# Patient Record
Sex: Female | Born: 1935 | Race: Black or African American | Hispanic: No | Smoking: Former smoker
Health system: Southern US, Community
[De-identification: ages and names within clinical notes are randomized; demographics above are authoritative.]

## PROBLEM LIST (undated history)

## (undated) DIAGNOSIS — I714 Abdominal aortic aneurysm, without rupture, unspecified: Secondary | ICD-10-CM

## (undated) DIAGNOSIS — L270 Generalized skin eruption due to drugs and medicaments taken internally: Secondary | ICD-10-CM

## (undated) DIAGNOSIS — E785 Hyperlipidemia, unspecified: Secondary | ICD-10-CM

## (undated) DIAGNOSIS — I639 Cerebral infarction, unspecified: Secondary | ICD-10-CM

## (undated) DIAGNOSIS — Z923 Personal history of irradiation: Secondary | ICD-10-CM

## (undated) DIAGNOSIS — K219 Gastro-esophageal reflux disease without esophagitis: Secondary | ICD-10-CM

## (undated) DIAGNOSIS — R000 Tachycardia, unspecified: Secondary | ICD-10-CM

## (undated) DIAGNOSIS — R042 Hemoptysis: Secondary | ICD-10-CM

## (undated) DIAGNOSIS — R5382 Chronic fatigue, unspecified: Secondary | ICD-10-CM

## (undated) DIAGNOSIS — J189 Pneumonia, unspecified organism: Secondary | ICD-10-CM

## (undated) DIAGNOSIS — C3490 Malignant neoplasm of unspecified part of unspecified bronchus or lung: Secondary | ICD-10-CM

## (undated) DIAGNOSIS — M1990 Unspecified osteoarthritis, unspecified site: Secondary | ICD-10-CM

## (undated) DIAGNOSIS — I739 Peripheral vascular disease, unspecified: Secondary | ICD-10-CM

## (undated) DIAGNOSIS — R918 Other nonspecific abnormal finding of lung field: Secondary | ICD-10-CM

## (undated) DIAGNOSIS — I10 Essential (primary) hypertension: Secondary | ICD-10-CM

## (undated) DIAGNOSIS — M79672 Pain in left foot: Secondary | ICD-10-CM

## (undated) DIAGNOSIS — R0602 Shortness of breath: Secondary | ICD-10-CM

## (undated) DIAGNOSIS — J95811 Postprocedural pneumothorax: Secondary | ICD-10-CM

## (undated) HISTORY — PX: LUMBAR DISC SURGERY: SHX700

## (undated) HISTORY — DX: Pain in left foot: M79.672

## (undated) HISTORY — DX: Abdominal aortic aneurysm, without rupture, unspecified: I71.40

## (undated) HISTORY — PX: EXPLORATORY LAPAROTOMY: SUR591

## (undated) HISTORY — DX: Chronic fatigue, unspecified: R53.82

## (undated) HISTORY — DX: Morbid (severe) obesity due to excess calories: E66.01

## (undated) HISTORY — DX: Essential (primary) hypertension: I10

## (undated) HISTORY — DX: Hyperlipidemia, unspecified: E78.5

## (undated) HISTORY — DX: Cerebral infarction, unspecified: I63.9

## (undated) HISTORY — DX: Postprocedural pneumothorax: J95.811

## (undated) HISTORY — DX: Generalized skin eruption due to drugs and medicaments taken internally: L27.0

## (undated) HISTORY — DX: Hemoptysis: R04.2

## (undated) HISTORY — DX: Tachycardia, unspecified: R00.0

## (undated) HISTORY — PX: APPENDECTOMY: SHX54

## (undated) HISTORY — PX: OTHER SURGICAL HISTORY: SHX169

## (undated) HISTORY — DX: Abdominal aortic aneurysm, without rupture: I71.4

---

## 1997-12-31 ENCOUNTER — Other Ambulatory Visit: Admission: RE | Admit: 1997-12-31 | Discharge: 1997-12-31 | Payer: Self-pay | Admitting: Family Medicine

## 1998-08-16 ENCOUNTER — Encounter: Payer: Self-pay | Admitting: Family Medicine

## 1998-08-16 ENCOUNTER — Ambulatory Visit (HOSPITAL_COMMUNITY): Admission: RE | Admit: 1998-08-16 | Discharge: 1998-08-16 | Payer: Self-pay | Admitting: Family Medicine

## 1999-04-11 ENCOUNTER — Other Ambulatory Visit: Admission: RE | Admit: 1999-04-11 | Discharge: 1999-04-11 | Payer: Self-pay | Admitting: Family Medicine

## 1999-08-27 ENCOUNTER — Encounter: Admission: RE | Admit: 1999-08-27 | Discharge: 1999-10-01 | Payer: Self-pay | Admitting: Internal Medicine

## 2000-03-16 ENCOUNTER — Emergency Department (HOSPITAL_COMMUNITY): Admission: EM | Admit: 2000-03-16 | Discharge: 2000-03-16 | Payer: Self-pay | Admitting: Emergency Medicine

## 2000-03-16 ENCOUNTER — Encounter: Payer: Self-pay | Admitting: Emergency Medicine

## 2000-03-25 ENCOUNTER — Emergency Department (HOSPITAL_COMMUNITY): Admission: EM | Admit: 2000-03-25 | Discharge: 2000-03-25 | Payer: Self-pay | Admitting: Emergency Medicine

## 2000-03-30 ENCOUNTER — Emergency Department (HOSPITAL_COMMUNITY): Admission: EM | Admit: 2000-03-30 | Discharge: 2000-03-30 | Payer: Self-pay | Admitting: Emergency Medicine

## 2000-04-19 ENCOUNTER — Inpatient Hospital Stay (HOSPITAL_COMMUNITY): Admission: RE | Admit: 2000-04-19 | Discharge: 2000-04-20 | Payer: Self-pay | Admitting: Orthopaedic Surgery

## 2001-04-15 ENCOUNTER — Encounter: Payer: Self-pay | Admitting: Gastroenterology

## 2001-04-15 ENCOUNTER — Encounter: Admission: RE | Admit: 2001-04-15 | Discharge: 2001-04-15 | Payer: Self-pay | Admitting: Gastroenterology

## 2001-05-25 ENCOUNTER — Encounter (INDEPENDENT_AMBULATORY_CARE_PROVIDER_SITE_OTHER): Payer: Self-pay | Admitting: *Deleted

## 2001-05-25 ENCOUNTER — Ambulatory Visit (HOSPITAL_COMMUNITY): Admission: RE | Admit: 2001-05-25 | Discharge: 2001-05-25 | Payer: Self-pay | Admitting: Gastroenterology

## 2001-12-15 ENCOUNTER — Ambulatory Visit (HOSPITAL_COMMUNITY): Admission: RE | Admit: 2001-12-15 | Discharge: 2001-12-15 | Payer: Self-pay | Admitting: Gastroenterology

## 2002-02-22 ENCOUNTER — Ambulatory Visit (HOSPITAL_COMMUNITY): Admission: RE | Admit: 2002-02-22 | Discharge: 2002-02-22 | Payer: Self-pay | Admitting: Gastroenterology

## 2004-11-28 ENCOUNTER — Encounter: Admission: RE | Admit: 2004-11-28 | Discharge: 2004-11-28 | Payer: Self-pay | Admitting: Gastroenterology

## 2004-12-05 ENCOUNTER — Ambulatory Visit (HOSPITAL_COMMUNITY): Admission: RE | Admit: 2004-12-05 | Discharge: 2004-12-05 | Payer: Self-pay | Admitting: Gastroenterology

## 2004-12-05 ENCOUNTER — Encounter (INDEPENDENT_AMBULATORY_CARE_PROVIDER_SITE_OTHER): Payer: Self-pay | Admitting: Specialist

## 2005-10-29 ENCOUNTER — Emergency Department (HOSPITAL_COMMUNITY): Admission: EM | Admit: 2005-10-29 | Discharge: 2005-10-29 | Payer: Self-pay | Admitting: Emergency Medicine

## 2005-11-08 ENCOUNTER — Emergency Department (HOSPITAL_COMMUNITY): Admission: EM | Admit: 2005-11-08 | Discharge: 2005-11-08 | Payer: Self-pay | Admitting: Emergency Medicine

## 2006-04-15 ENCOUNTER — Encounter: Admission: RE | Admit: 2006-04-15 | Discharge: 2006-05-25 | Payer: Self-pay | Admitting: Rheumatology

## 2006-08-19 ENCOUNTER — Ambulatory Visit: Payer: Self-pay | Admitting: *Deleted

## 2007-02-03 ENCOUNTER — Ambulatory Visit: Payer: Self-pay | Admitting: *Deleted

## 2007-08-04 ENCOUNTER — Ambulatory Visit: Payer: Self-pay | Admitting: *Deleted

## 2007-10-03 ENCOUNTER — Emergency Department (HOSPITAL_COMMUNITY): Admission: EM | Admit: 2007-10-03 | Discharge: 2007-10-03 | Payer: Self-pay | Admitting: Emergency Medicine

## 2008-01-25 ENCOUNTER — Emergency Department (HOSPITAL_COMMUNITY): Admission: EM | Admit: 2008-01-25 | Discharge: 2008-01-25 | Payer: Self-pay | Admitting: Emergency Medicine

## 2008-02-02 ENCOUNTER — Ambulatory Visit: Payer: Self-pay | Admitting: *Deleted

## 2008-08-20 ENCOUNTER — Ambulatory Visit: Payer: Self-pay | Admitting: *Deleted

## 2009-03-21 ENCOUNTER — Ambulatory Visit: Payer: Self-pay | Admitting: Vascular Surgery

## 2009-10-03 ENCOUNTER — Encounter: Admission: RE | Admit: 2009-10-03 | Discharge: 2009-10-03 | Payer: Self-pay | Admitting: Vascular Surgery

## 2009-10-03 ENCOUNTER — Ambulatory Visit: Payer: Self-pay | Admitting: Vascular Surgery

## 2010-04-02 ENCOUNTER — Encounter: Admission: RE | Admit: 2010-04-02 | Discharge: 2010-04-02 | Payer: Self-pay | Admitting: Vascular Surgery

## 2010-04-02 ENCOUNTER — Ambulatory Visit: Payer: Self-pay | Admitting: Vascular Surgery

## 2010-06-28 ENCOUNTER — Inpatient Hospital Stay (HOSPITAL_COMMUNITY)
Admission: EM | Admit: 2010-06-28 | Discharge: 2010-07-02 | Payer: Self-pay | Source: Home / Self Care | Attending: Vascular Surgery | Admitting: Vascular Surgery

## 2010-06-28 HISTORY — PX: ABDOMINAL AORTIC ANEURYSM REPAIR: SHX42

## 2010-07-07 LAB — POCT I-STAT 3, ART BLOOD GAS (G3+)
Acid-base deficit: 1 mmol/L (ref 0.0–2.0)
Acid-base deficit: 1 mmol/L (ref 0.0–2.0)
Acid-base deficit: 2 mmol/L (ref 0.0–2.0)
Acid-base deficit: 1 mmol/L (ref 0.0–2.0)
Bicarbonate: 23.6 mEq/L (ref 20.0–24.0)
Bicarbonate: 25.5 mEq/L — ABNORMAL HIGH (ref 20.0–24.0)
Bicarbonate: 22.3 mEq/L (ref 20.0–24.0)
Bicarbonate: 23.5 mEq/L (ref 20.0–24.0)
O2 Saturation: 94 %
O2 Saturation: 92 %
O2 Saturation: 94 %
O2 Saturation: 93 %
Patient temperature: 98
Patient temperature: 97.5
Patient temperature: 98
TCO2: 25 mmol/L (ref 0–100)
TCO2: 27 mmol/L (ref 0–100)
TCO2: 23 mmol/L (ref 0–100)
TCO2: 25 mmol/L (ref 0–100)
pCO2 arterial: 39.5 mmHg (ref 35.0–45.0)
pCO2 arterial: 46.8 mmHg — ABNORMAL HIGH (ref 35.0–45.0)
pCO2 arterial: 37.5 mmHg (ref 35.0–45.0)
pCO2 arterial: 36.5 mmHg (ref 35.0–45.0)
pH, Arterial: 7.383 (ref 7.350–7.400)
pH, Arterial: 7.341 — ABNORMAL LOW (ref 7.350–7.400)
pH, Arterial: 7.382 (ref 7.350–7.400)
pH, Arterial: 7.414 — ABNORMAL HIGH (ref 7.350–7.400)
pO2, Arterial: 72 mmHg — ABNORMAL LOW (ref 80.0–100.0)
pO2, Arterial: 65 mmHg — ABNORMAL LOW (ref 80.0–100.0)
pO2, Arterial: 73 mmHg — ABNORMAL LOW (ref 80.0–100.0)
pO2, Arterial: 65 mmHg — ABNORMAL LOW (ref 80.0–100.0)

## 2010-07-07 LAB — POCT I-STAT 4, (NA,K, GLUC, HGB,HCT)
Glucose, Bld: 96 mg/dL (ref 70–99)
HCT: 31 % — ABNORMAL LOW (ref 36.0–46.0)
Potassium: 3.4 mEq/L — ABNORMAL LOW (ref 3.5–5.1)

## 2010-07-07 LAB — GLUCOSE, CAPILLARY
Glucose-Capillary: 107 mg/dL — ABNORMAL HIGH (ref 70–99)
Glucose-Capillary: 108 mg/dL — ABNORMAL HIGH (ref 70–99)
Glucose-Capillary: 111 mg/dL — ABNORMAL HIGH (ref 70–99)
Glucose-Capillary: 116 mg/dL — ABNORMAL HIGH (ref 70–99)
Glucose-Capillary: 117 mg/dL — ABNORMAL HIGH (ref 70–99)
Glucose-Capillary: 124 mg/dL — ABNORMAL HIGH (ref 70–99)
Glucose-Capillary: 132 mg/dL — ABNORMAL HIGH (ref 70–99)
Glucose-Capillary: 100 mg/dL — ABNORMAL HIGH (ref 70–99)
Glucose-Capillary: 102 mg/dL — ABNORMAL HIGH (ref 70–99)
Glucose-Capillary: 109 mg/dL — ABNORMAL HIGH (ref 70–99)
Glucose-Capillary: 114 mg/dL — ABNORMAL HIGH (ref 70–99)
Glucose-Capillary: 122 mg/dL — ABNORMAL HIGH (ref 70–99)
Glucose-Capillary: 131 mg/dL — ABNORMAL HIGH (ref 70–99)
Glucose-Capillary: 91 mg/dL (ref 70–99)
Glucose-Capillary: 96 mg/dL (ref 70–99)
Glucose-Capillary: 98 mg/dL (ref 70–99)
Glucose-Capillary: 134 mg/dL — ABNORMAL HIGH (ref 70–99)

## 2010-07-07 LAB — COMPREHENSIVE METABOLIC PANEL
ALT: 16 U/L (ref 0–35)
AST: 18 U/L (ref 0–37)
Albumin: 2.8 g/dL — ABNORMAL LOW (ref 3.5–5.2)
Alkaline Phosphatase: 83 U/L (ref 39–117)
BUN: 10 mg/dL (ref 6–23)
CO2: 26 mEq/L (ref 19–32)
Calcium: 8.4 mg/dL (ref 8.4–10.5)
Chloride: 106 mEq/L (ref 96–112)
Creatinine, Ser: 0.75 mg/dL (ref 0.4–1.2)
GFR calc Af Amer: >60 mL/min (ref >60)
GFR calc non Af Amer: >60 mL/min (ref >60)
Glucose, Bld: 105 mg/dL — ABNORMAL HIGH (ref 70–99)
Potassium: 4.2 mEq/L (ref 3.5–5.1)
Sodium: 140 mEq/L (ref 135–145)
Total Bilirubin: 0.3 mg/dL (ref 0.3–1.2)
Total Protein: 6.3 g/dL (ref 6.0–8.3)

## 2010-07-07 LAB — CROSSMATCH
ABO/RH(D): B POS
Antibody Screen: NEGATIVE
Unit division: 0
Unit division: 0
Unit division: 0
Unit division: 0

## 2010-07-07 LAB — BASIC METABOLIC PANEL
BUN: 18 mg/dL (ref 6–23)
BUN: 14 mg/dL (ref 6–23)
CO2: 23 mEq/L (ref 19–32)
CO2: 25 mEq/L (ref 19–32)
Calcium: 8 mg/dL — ABNORMAL LOW (ref 8.4–10.5)
Calcium: 8.6 mg/dL (ref 8.4–10.5)
Chloride: 104 mEq/L (ref 96–112)
Chloride: 106 mEq/L (ref 96–112)
Creatinine, Ser: 0.77 mg/dL (ref 0.4–1.2)
Creatinine, Ser: 0.71 mg/dL (ref 0.4–1.2)
GFR calc Af Amer: >60 mL/min (ref >60)
GFR calc Af Amer: >60 mL/min (ref >60)
GFR calc non Af Amer: >60 mL/min (ref >60)
GFR calc non Af Amer: >60 mL/min (ref >60)
Glucose, Bld: 99 mg/dL (ref 70–99)
Glucose, Bld: 108 mg/dL — ABNORMAL HIGH (ref 70–99)
Potassium: 3.2 mEq/L — ABNORMAL LOW (ref 3.5–5.1)
Potassium: 4.4 mEq/L (ref 3.5–5.1)
Sodium: 134 mEq/L — ABNORMAL LOW (ref 135–145)
Sodium: 136 mEq/L (ref 135–145)

## 2010-07-07 LAB — CBC
HCT: 34.2 % — ABNORMAL LOW (ref 36.0–46.0)
HCT: 37.7 % (ref 36.0–46.0)
HCT: 35.1 % — ABNORMAL LOW (ref 36.0–46.0)
Hemoglobin: 11.4 g/dL — ABNORMAL LOW (ref 12.0–15.0)
Hemoglobin: 12.4 g/dL (ref 12.0–15.0)
Hemoglobin: 11.5 g/dL — ABNORMAL LOW (ref 12.0–15.0)
MCH: 31.8 pg (ref 26.0–34.0)
MCH: 31.5 pg (ref 26.0–34.0)
MCH: 31.7 pg (ref 26.0–34.0)
MCHC: 33.3 g/dL (ref 30.0–36.0)
MCHC: 32.9 g/dL (ref 30.0–36.0)
MCHC: 32.8 g/dL (ref 30.0–36.0)
MCV: 95.3 fL (ref 78.0–100.0)
MCV: 95.7 fL (ref 78.0–100.0)
MCV: 96.7 fL (ref 78.0–100.0)
Platelets: 163 10*3/uL (ref 150–400)
Platelets: 178 10*3/uL (ref 150–400)
Platelets: 156 10*3/uL (ref 150–400)
RBC: 3.59 MIL/uL — ABNORMAL LOW (ref 3.87–5.11)
RBC: 3.94 MIL/uL (ref 3.87–5.11)
RBC: 3.63 MIL/uL — ABNORMAL LOW (ref 3.87–5.11)
RDW: 13.5 % (ref 11.5–15.5)
RDW: 13.7 % (ref 11.5–15.5)
RDW: 13.7 % (ref 11.5–15.5)
WBC: 8 10*3/uL (ref 4.0–10.5)
WBC: 9.7 10*3/uL (ref 4.0–10.5)
WBC: 10.4 10*3/uL (ref 4.0–10.5)

## 2010-07-07 LAB — MAGNESIUM
Magnesium: 1.7 mg/dL (ref 1.5–2.5)
Magnesium: 1.8 mg/dL (ref 1.5–2.5)

## 2010-07-07 LAB — POCT I-STAT 7, (LYTES, BLD GAS, ICA,H+H)
Bicarbonate: 23.3 mEq/L (ref 20.0–24.0)
Calcium, Ion: 1.15 mmol/L (ref 1.12–1.32)
O2 Saturation: 94 %
Potassium: 3.4 mEq/L — ABNORMAL LOW (ref 3.5–5.1)
Sodium: 139 mEq/L (ref 135–145)
TCO2: 24 mmol/L (ref 0–100)
pCO2 arterial: 32 mmHg — ABNORMAL LOW (ref 35.0–45.0)
pH, Arterial: 7.465 — ABNORMAL HIGH (ref 7.350–7.400)

## 2010-07-07 LAB — HEMOGLOBIN A1C
Hgb A1c MFr Bld: 6 % — ABNORMAL HIGH (ref <5.7)
Mean Plasma Glucose: 126 mg/dL — ABNORMAL HIGH (ref <117)

## 2010-07-07 LAB — PROTIME-INR
INR: 1.15 (ref 0.00–1.49)
Prothrombin Time: 14.9 seconds (ref 11.6–15.2)

## 2010-07-07 LAB — MRSA PCR SCREENING: MRSA by PCR: NEGATIVE

## 2010-07-07 LAB — ABO/RH: ABO/RH(D): B POS

## 2010-07-07 LAB — APTT: aPTT: 26 seconds (ref 24–37)

## 2010-07-07 LAB — AMYLASE: Amylase: 94 U/L (ref 0–105)

## 2010-07-12 ENCOUNTER — Other Ambulatory Visit: Payer: Self-pay | Admitting: Vascular Surgery

## 2010-07-12 DIAGNOSIS — I714 Abdominal aortic aneurysm, without rupture: Secondary | ICD-10-CM

## 2010-07-12 DIAGNOSIS — Z9889 Other specified postprocedural states: Secondary | ICD-10-CM

## 2010-07-29 ENCOUNTER — Inpatient Hospital Stay: Admission: RE | Admit: 2010-07-29 | Payer: Self-pay | Source: Ambulatory Visit

## 2010-07-29 ENCOUNTER — Ambulatory Visit
Admission: RE | Admit: 2010-07-29 | Discharge: 2010-07-29 | Disposition: A | Discharge status: Home / Self Care | Payer: Medicare Other | Source: Ambulatory Visit | Attending: Vascular Surgery | Admitting: Vascular Surgery

## 2010-07-29 ENCOUNTER — Ambulatory Visit (INDEPENDENT_AMBULATORY_CARE_PROVIDER_SITE_OTHER): Payer: Medicare Other | Admitting: Vascular Surgery

## 2010-07-29 DIAGNOSIS — Z48812 Encounter for surgical aftercare following surgery on the circulatory system: Secondary | ICD-10-CM

## 2010-07-29 DIAGNOSIS — Z9889 Other specified postprocedural states: Secondary | ICD-10-CM

## 2010-07-29 DIAGNOSIS — I714 Abdominal aortic aneurysm, without rupture: Secondary | ICD-10-CM

## 2010-07-29 DIAGNOSIS — I712 Thoracic aortic aneurysm, without rupture: Secondary | ICD-10-CM

## 2010-07-29 MED ORDER — IOHEXOL 350 MG/ML SOLN: 100.0000 mL | Freq: Once | INTRAVENOUS | Status: AC | PRN

## 2010-08-01 NOTE — Assessment & Plan Note (Signed)
OFFICE VISIT  ABRIEL, HATTERY DOB:  1929-05-26                                       07/29/2010 [id number]#:[id number]  The patient underwent an urgent insertion of an aortic stent graft Emeline Darling) aortobi-common iliac on January 7 for symptomatic infrarenal abdominal aortic aneurysm.  She tolerated the procedure well and had a straightforward postoperative course and was discharged on the 4th postoperative day.  She has been increasing her activity as tolerated since her discharge with no nausea, vomiting or problems with her appetite.  She is in her home with her daughter at the present time. She denies any abdominal or back symptoms she was not experiencing before.  PHYSICAL EXAMINATION:  Vital signs:  Blood pressure 119/81, heart rate 76, respirations 18.  Abdomen:  Obese.  No palpable mass is noted.  Both inguinal incisions have healed nicely with 3+ femoral pulses.  Both feet are well-perfused.  Today I ordered lower extremity arterial Dopplers which revealed 0.40 on the right 0.49 on the left.  I also reviewed her CT angiogram done at [facility] which reveals the graft to be in excellent position with no evidence of endoleak or migration.  The maximum diameter of the aneurysm continues to be in the 58- to 60-mm range as it was preoperatively.  In general, I think she is getting along well.  She will continue to increase her activity as tolerated and return in 6 months for follow-up CT angiogram at that time as she had today.    Quita Skye Hart [place], M.D. Electronically Signed  JDL/MEDQ  D:  07/29/2010  T:  07/30/2010  Job:  [id number]  cc:   Renaye Rakers, M.D.

## 2010-11-04 ENCOUNTER — Other Ambulatory Visit: Payer: Self-pay | Admitting: Vascular Surgery

## 2010-11-04 DIAGNOSIS — I712 Thoracic aortic aneurysm, without rupture: Secondary | ICD-10-CM

## 2010-11-04 NOTE — Procedures (Signed)
DUPLEX ULTRASOUND OF ABDOMINAL AORTA   INDICATION:  Followup abdominal aortic aneurysm.   HISTORY:  Diabetes:  Yes.  Cardiac:  No.  Hypertension:  Yes.  Smoking:  Yes.  Connective Tissue Disorder:  Family History:  Previous Surgery:   DUPLEX EXAM:         AP (cm)                   TRANSVERSE (cm)  Proximal             2.59 cm                   2.82 cm  Mid                  5.00 cm                   4.92 cm  Distal               4.89 cm                   4.95 cm  Right Iliac          2.10 cm                   1.72 cm  Left Iliac           1.70 cm                   1.81 cm   PREVIOUS:  Date:  AP:  5.0  TRANSVERSE:  4.8   IMPRESSION:  1. Abdominal aortic aneurysm noted with the largest measurement of      5.00 cm x 4.92 cm.  2. Exam unchanged from previous.   ___________________________________________  Di Kindle. Edilia Bo, M.D.   MG/MEDQ  D:  03/21/2009  T:  03/21/2009  Job:  [id number]

## 2010-11-04 NOTE — Procedures (Signed)
DUPLEX ULTRASOUND OF ABDOMINAL AORTA   INDICATION:  Followup abdominal aortic aneurysm.   HISTORY:  Diabetes:  Yes  Cardiac:  No  Hypertension:  Yes  Smoking:  Yes  Connective Tissue Disorder:  Family History:  No  Previous Surgery:  No   DUPLEX EXAM:         AP (cm)                   TRANSVERSE (cm)  Proximal             2.33 cm                   2.46 cm  Mid                  3.37 cm                   3.27 cm  Distal               4.24 cm                   4.16 cm  Right Iliac          Unable to visualize.  Left Iliac           Unable to visualize.   PREVIOUS:  Date: August 19, 2006  AP:  3.88  TRANSVERSE:  4.14   IMPRESSION:  The abdominal aorta shows no significant changes from  previous study.  Slight increase from 4.14 cm to 4.24 cm.  Mural thrombus noted.  Note:  Technically difficult study due to body habitus and bowel gas.   ___________________________________________  P. Liliane Bade, M.D.   AS/MEDQ  D:  02/03/2007  T:  02/04/2007  Job:  [id number]

## 2010-11-04 NOTE — Procedures (Signed)
DUPLEX ULTRASOUND OF ABDOMINAL AORTA   INDICATION:  Follow-up abdominal aortic aneurysm.   HISTORY:  Diabetes:  Yes.  Cardiac:  No.  Hypertension:  Yes.  Smoking:  Yes.  Connective Tissue Disorder:  Family History:  No.  Previous Surgery:  No.   DUPLEX EXAM:         AP (cm)                   TRANSVERSE (cm)  Proximal             2.9 cm                    2.7 cm  Mid                  3.3 cm                    4.0 cm  Distal               5.0 cm                    4.8 cm  Right Iliac          Not visualized            Not visualized  Left Iliac           Not visualized            Not visualized   PREVIOUS:  Date: 02/02/2008  AP:  4.6  TRANSVERSE:  4.4   IMPRESSION:  1. Aneurysm of the mid to distal abdominal aorta with a mild increase      in the maximum diameter noted when compared to the previous exam.  2. Unable to adequately visualize the bilateral common iliac arteries      due to patient body habitus and overlying bowel gas patterns.   ___________________________________________  P. Liliane Bade, M.D.   CH/MEDQ  D:  08/20/2008  T:  08/20/2008  Job:  [id number]

## 2010-11-04 NOTE — Procedures (Signed)
DUPLEX ULTRASOUND OF ABDOMINAL AORTA   INDICATION:  Followup of abdominal aortic aneurysm.   HISTORY:  Diabetes:  Yes.  Cardiac:  No.  Hypertension:  Yes.  Smoking:  Yes.  Connective Tissue Disorder:  Family History:  No.  Previous Surgery:  No.   DUPLEX EXAM:         AP (cm)                   TRANSVERSE (cm)  Proximal             2.62 Cm                   2.38 cm  Mid                  4.45 cm                   4.45 cm  Distal               4.15 cm                   4.02 cm  Right Iliac          1.96 cm                   2.01 cm  Left Iliac           1.43 cm                   1.43 cm   PREVIOUS:  Date: 02/03/07  AP:  4.24  TRANSVERSE:  4.16   IMPRESSION:  1. Abdominal aortic aneurysm shows evidence of slight increase from      previous study.  2. Ectatic right common iliac artery; however, barely able to      visualize common iliac arteries.  3. Technically difficult study due to body habitus and vessel depth.   ___________________________________________  P. Liliane Bade, M.D.   AS/MEDQ  D:  08/04/2007  T:  08/05/2007  Job:  [id number]

## 2010-11-04 NOTE — Assessment & Plan Note (Signed)
OFFICE VISIT   MISHELLE, HASSAN  DOB:  [birth year]                                       04/02/2010  SEGBT#:[id number]   I saw patient in the office today for continued follow-up of her  abdominal aortic aneurysm.  This is a patient who had been followed by  Dr. Madilyn Fireman with an abdominal aortic aneurysm.  I saw her for the first  time in April of this year when by CT scan the aneurysm measured 5.3 cm  in maximum diameter.  She was asymptomatic, and I explained that for a  normal-risk patient, we would consider elective repair of the aneurysm  at 5.5 cm.  Given her obesity and age, certainly I think she would be at  higher risk for repair than normal.  She comes in for a 75-month follow-  up visit.  Since I saw her last, she continues to have chronic low back  pain.  She has had no abdominal pain.   There has been no significant change in her medical history.  She does  have a history of diabetes, hypertension, and hypercholesterolemia, all  of which are stable on her current medications.  She is followed closely  by Dr. Parke Simmers.  In addition, she has significant obesity.  She denies any  history of previous myocardial infarction, history of congestive heart  failure, or history of COPD.   SOCIAL HISTORY:  She is widowed.  She has 3 children.  One of her  children has deceased.  She smokes less than a half pack per day of  cigarettes.   FAMILY HISTORY:  There is no history of premature cardiovascular  disease.  She is unaware of any history of aneurysmal disease.   REVIEW OF SYSTEMS:  CARDIOVASCULAR:  She had no chest pain, chest  pressure, palpitations, or arrhythmias.  She does admit to dyspnea on  exertion.  She currently requires a wheelchair a fair amount.  She has  had a mini stroke in the past.  She has also had a previous history of  DVT.  GI:  She has occasional diarrhea and occasional constipation.  PULMONARY:  She has had no productive cough,  bronchitis, asthma, or  wheezing.  MUSCULOSKELETAL:  She does have generalized arthritis.   On physical examination, this is a pleasant 77 year old woman who  appears her stated age.  Blood pressure is 119/67, heart rate is 71,  saturation is 97%.  She is morbidly obese and is 5 feet 3.  Lungs are  clear bilaterally to auscultation.  On cardiovascular exam, I do not  detect any carotid bruits.  She has a regular rate and rhythm.  She has  palpable femoral pulses.  I cannot palpate pedal pulses; however, both  feet are warm and well-perfused.  She has no significant lower extremity  swelling.  Abdomen is soft.  Because of her obesity, it is difficult to  assess.  I cannot palpate her aneurysm.  She has normal-pitched bowel  sounds.  Musculoskeletal:  There are no major deformities or cyanosis.  Neurologic:  She has no focal weakness or paresthesias.  Skin:  There  are no ulcers or rashes.   I have independently reviewed her CT scan from today.  The aneurysm  measures 5.4 cm in maximum diameter and has thus not changed  significantly.  Her right  common iliac artery measures 2.6 cm in maximal  diameter and extends down to the bifurcation.  The left common iliac  artery is slightly ectatic but not frankly aneurysmal.   The aneurysm has not changed significantly in size over the last 6  months.  I have explained again that for a normal-risk patient, we would  not consider elective repair unless the aneurysm was greater than 5.5  cm.  With her age and obesity, certainly she is higher risk.  I have  recommend a follow-up CT scan in 6 months, and I will see him back at  that time.  If her aneurysm does enlarge significantly, we would have to  consider elective repair.  I would favor an endovascular approach if  possible, and this would likely involve embolization of the right  hypogastric artery and then extending the right limb of the endovascular  stent graft down into the external iliac  artery on the right.  She does  have appear to have a reasonable neck, although there is some laminated  thrombus in the proximal neck.  We have again discussed the importance  of tobacco cessation.     Di Kindle. Edilia Bo, M.D.  Electronically Signed   CSD/MEDQ  D:  04/02/2010  T:  04/03/2010  Job:  [id number]   cc:   Renaye Rakers, M.D.

## 2010-11-04 NOTE — Procedures (Signed)
DUPLEX ULTRASOUND OF ABDOMINAL AORTA   INDICATION:  Followup evaluation of known abdominal aortic aneurysm.   HISTORY:  Diabetes:  Yes.  Cardiac:  No.  Hypertension:  Yes.  Smoking:  Yes.  Connective Tissue Disorder:  Family History:  No.  Previous Surgery:  No.   DUPLEX EXAM:         AP (cm)                   TRANSVERSE (cm)  Proximal             2.9 cm                    2.9 cm  Mid                  4.6 cm                    4.4 cm  Distal               1.8 cm                    1.9 cm  Right Iliac          1.7 cm                    1.6 cm  Left Iliac           1.3 cm                    1.5 cm   PREVIOUS:  Date:  08/04/2007  AP:  4.2  TRANSVERSE:  4.2   IMPRESSION:  Abdominal aortic aneurysm measurements are slightly larger  than previously recorded.   ___________________________________________  P. Liliane Bade, M.D.   MC/MEDQ  D:  02/02/2008  T:  02/02/2008  Job:  [id number]

## 2010-11-04 NOTE — Assessment & Plan Note (Signed)
OFFICE VISIT   Beverly Lewis, Beverly Lewis  DOB:  Jun 13, 1929                                       03/21/2009  HYQMV#:[id number]   I saw the patient in the office today for continued followup of her  abdominal aortic aneurysm.  She has been followed by Dr. Madilyn Fireman with an  abdominal aortic aneurysm.  He has now left our practice and I will be  following her aneurysm.  This is a pleasant 77 year old woman who was  originally seen in June of 2006 with a 4 cm abdominal aortic aneurysm.  She has been followed at 6 month intervals.  She came in for a routine 6  month followup study.  Of note, she has had no abdominal pain.  She does  admit to some chronic low back pain.  There is no family history of  aneurysmal disease that she is aware of.   PAST MEDICAL HISTORY:  Significant for adult onset diabetes,  hypertension and hypercholesterolemia.  She denies any history of  previous myocardial infarction, history congestive heart failure or  history of COPD.   FAMILY HISTORY:  There is no history of premature cardiovascular disease  that she is aware of.  Her mother had some heart problems but she is not  sure at what age.   SOCIAL HISTORY:  She is widowed.  She has two children.  She smokes a  quarter of a pack per day of cigarettes and has been smoking for as long  as she can remember.   ALLERGIES:  No known drug allergies.   MEDICATIONS:  She cannot remember her medications and did not have her  list with her today.   REVIEW OF SYSTEMS:  Is documented on the medical history form in her  chart.   PHYSICAL EXAMINATION:  General:  This is a pleasant 77 year old woman  who appears her stated age.  She is moderately obese.  Vital signs:  Blood pressure is 132/80, heart rate is 96.  HEENT:  Unremarkable.  Neck:  Supple.  I do not detect any carotid bruits.  Lungs:  Are clear  bilaterally to auscultation.  Cardiac:  She has a regular rate and  rhythm.  Abdomen:  Obese  and difficult to assess.  I cannot palpate her  aneurysm.  She does have palpable femoral pulses and palpable posterior  tibial pulses bilaterally.  She has no significant lower extremity  swelling.  She has no ulcers or rashes.  Neurologic:  Is nonfocal.  She  has no evidence of atheroembolic disease.   I did review her ultrasound today which shows the maximum diameter of  her aneurysm is 5 cm.  Right common iliac artery measures 2.1 cm in  maximum diameter and left common iliac artery measures 1.8 cm in maximum  diameter.  The size of her aneurysm has not changed since her previous  study 6 months ago.   I have explained that there has been no change in the size of her  aneurysm.  I have explained we generally would not consider elective  repair unless the aneurysm reached 5.5 cm in maximum diameter.  I have  discussed with her the importance of tobacco cessation as this has been  shown to increase the risk of aneurysm enlargement.  We will see her  back in 6 months.  I have  recommended we obtain a CT scan as a baseline  for her next visit.  She knows to call sooner if she has problems.   Di Kindle. Edilia Bo, M.D.  Electronically Signed   CSD/MEDQ  D:  03/21/2009  T:  03/22/2009  Job:  [id number]   cc:   Renaye Rakers, M.D.

## 2010-11-04 NOTE — Assessment & Plan Note (Signed)
OFFICE VISIT   SIEARRA, AMBERG  DOB:  [birth year]                                       10/03/2009  JXBJY#:[id number]   I saw the patient in the office today for continued followup of her  abdominal aortic aneurysm.  This is a pleasant 76 year old woman who Dr.  Madilyn Fireman had been following with an aneurysm.  We last saw her back in  September of 2010 at which time by ultrasound her aneurysm measured 5 cm  in maximum diameter.  She was set up for a followup CT scan of the  abdomen and pelvis in 6 months and comes in for her 6 month followup  visit.   Since I saw her last she has had no acute onset abdominal pain.  She  does have some chronic low back pain which is brought on somewhat with  activity and relieved somewhat with leg elevation.  Her symptoms are  worse in the morning.  There are no other aggravating or alleviating  factors.   PAST MEDICAL HISTORY:  Significant for adult onset diabetes,  hypertension, hypercholesterolemia and obesity.  She denies any history  of previous myocardial infarction, history of congestive heart failure  or history of COPD.   SOCIAL HISTORY:  She is widowed.  She has three children.  She smokes  less than a half a pack per day of cigarettes.   REVIEW OF SYSTEMS:  CARDIOVASCULAR:  She has had no recent chest pain,  chest pressure, palpitations or arrhythmias.  She denies significant  orthopnea.  She does have dyspnea on exertion.  She has had no  claudication, rest pain or nonhealing ulcers.  She has had no history of  stroke or TIAs.  She has had no history of DVT.  MUSCULOSKELETAL:  She does have generalized arthritis.  Her arthritis is  worse in her knees which limits her activity significantly.  Today she  was in a wheelchair.  Review of systems is otherwise unremarkable and is  documented on her medical history form in her chart.   PHYSICAL EXAMINATION:  General:  This is a pleasant 77 year old woman  who  appears her stated age.  She is morbidly obese.  Vital signs:  Her  blood pressure is 130/79, heart rate is 62, respiratory rate 16.  HEENT:  Unremarkable.  Lungs:  Are clear bilaterally to auscultation without  rales, rhonchi or wheezing.  Cardiovascular:  I do not detect any  carotid bruits.  She has a regular rate and rhythm.  She has palpable  femoral pulses.  I cannot palpate pedal pulses.  Both feet are warm and  well-perfused without ischemic ulcers.  There is no evidence of  atheroembolic disease.  Abdomen:  Soft and nontender.  It is difficult  to palpate her aneurysm because of her size.  She has normal pitched  bowel sounds.  Neurologic:  She has no focal weakness or paresthesias.   I have independently interpreted her CT scan which by my measurement  measures 5.3 cm in maximum diameter.  There is some tortuosity of the  distal aorta making AP measurement I think inaccurate.  She also has  some ectasia of her iliac arteries more significantly on the left side  which measures about 2.7 cm in maximum size.  She does appear to have a  reasonable neck above the  aneurysm below her renal arteries.   I have explained that we would not consider elective repair in a normal  risk patient unless the aneurysm reached 5.5 cm in maximum diameter.  Certainly she would be at significantly increased risk for surgery given  her age and her obesity.  I have recommended a followup CT scan in 6  months and I will see her back at that time.  If the aneurysm were to  continue to enlarge we would have to consider elective repair; hopefully  she would be a candidate for endovascular repair given the risk involved  of open repair especially with her obesity.  In addition, we have had a  long discussion about the importance of tobacco cessation and she  understands that continued tobacco use is associated with an increased  risk of aneurysm enlargement and rupture.  I will see her back in 6  months.   She knows to call sooner if she has problems.     Di Kindle. Edilia Bo, M.D.  Electronically Signed   CSD/MEDQ  D:  10/03/2009  T:  10/04/2009  Job:  [id number]   cc:   Renaye Rakers, M.D.  Dr Loreta [address]

## 2010-11-07 NOTE — Procedures (Signed)
[name]  Patient:    Beverly Lewis, Beverly Lewis Visit Number: [id number] MRN: [id number]          Service Type: END Location: ENDO Attending Physician:  Charna Elizabeth Dictated by:   Anselmo Rod, M.D. Proc. Date: 05/25/01 Admit Date:  05/25/2001   CC:         [name], M.D.   Procedure Report  DATE OF BIRTH:  May 03, 1929.  PROCEDURE:  Colonoscopy with snare polypectomy x 1.  ENDOSCOPIST:  Anselmo Rod, M.D.  INSTRUMENT USED:  Olympus video colonoscope.  INDICATION FOR PROCEDURE:  Rectal bleeding in a 77 year old African-American female.  Rule out colonic polyps, masses, hemorrhoids, etc.  PREPROCEDURE PREPARATION:  Informed consent was procured from the patient. The patient was fasted for eight hours prior to the procedure and prepped with a bottle of magnesium citrate and a gallon of NuLytely the night prior to the procedure.  PREPROCEDURE PHYSICAL:  VITAL SIGNS:  The patient had stable vital signs.  NECK:  Supple.  CHEST:  Clear to auscultation.  S1, S2 regular.  ABDOMEN:  Soft with normal bowel sounds.  DESCRIPTION OF PROCEDURE:  The patient was placed in the left lateral decubitus position and sedated with 50 mg of Demerol and 4 mg of Versed intravenously.  Once the patient was adequately sedate and maintained on low-flow oxygen and continuous cardiac monitoring, the Olympus video colonoscope was advanced from the rectum to the cecum with extreme difficulty secondary to a large amount of solid stool in the colon.  There was extensive left-sided diverticulosis with a polypoid lesion at 20 cm.  This was on a broad stalk.  Snare polypectomy was done and the polyp removed.  The polyp was initially lost in stool but after several minutes of maneuvering, the polyp was retrieved.  No other masses or polyps were seen, however small lesions could have been missed.  IMPRESSION: 1. Extensive left-sided  diverticulosis. 2. Broad-based polyp, snared at 20 cm.  RECOMMENDATIONS: 1. Await pathology results. 2. Repeat flexible sigmoidoscopy depending on the pathology results to    re-evaluate the polypectomy site. 3. Avoid all nonsteroidals, including aspirin, for now, for the next four    weeks. 4. Outpatient follow-up in the next 10 days. Dictated by:   Anselmo Rod, M.D. Attending Physician:  Charna Elizabeth DD:  05/25/01 TD:  05/25/01 Job: [id number]

## 2010-11-07 NOTE — Op Note (Signed)
NAMESEMIAH, Beverly Lewis           ACCOUNT NO.:  [id number]   MEDICAL RECORD NO.:  [id number]          PATIENT TYPE:  AMB   LOCATION:  ENDO                         FACILITY:  [facility]   PHYSICIAN:  Anselmo Rod, M.D.  DATE OF BIRTH:  1929-01-17   DATE OF PROCEDURE:  12/05/2004  DATE OF DISCHARGE:                                 OPERATIVE REPORT   PROCEDURE PERFORMED:  Colonoscopy with snare polypectomy x1.   ENDOSCOPIST:  Anselmo Rod, MD.   INSTRUMENT USED:  Olympus video colonoscope.   INDICATION FOR PROCEDURE:  A 77 year old, African-American female undergoing  a screening colonoscopy to rule out chronic polyps, masses, etc.   PRE-PROCEDURE PREPARATION:  Informed consent was procured from the patient.  The patient fasted for eight hours prior to the procedure and prepped with a  bottle of magnesium citrate and a gallon of GoLYTELY the night prior to the  procedure.  The risks and benefits of the procedure including a 10% risk of  __________polyps were discussed with her as well.   PRE-PROCEDURE PHYSICAL:  VITAL SIGNS:  Patient had stable vital signs.  NECK:  Supple.  CHEST:  Clear to auscultation.  S1 and S2 regular.  ABDOMEN:  Soft with normal bowel sounds.   DESCRIPTION OF PROCEDURE:  The patient was placed in the left lateral  decubitus position and sedated with 40 mg of Demerol and 4 mg of Versed  instilled in incremental doses.  Once the patient was adequately sedated and  maintained on low-flow oxygen and continuous cardiac monitoring, the Olympus  video colonoscope was advanced from the rectum to the cecum with slight  difficulty.  There was some residual stool in the colon and multiple  washings were done.  A small, sessile polyp was snared from the rectum close  to the anal verge.  There was evidence of pandiverticulosis with more  prominent changes in the left colon.  The rest of the exam was unremarkable.  No other masses or polyps were seen.  The appendiceal  orifices and the  ileocecal valve were clearly visualized and photographed.   IMPRESSION:  1.  Pandiverticulosis with more prominent changes in the sigmoid colon.  2.  Small, sessile polyp snared from the rectum close to the anal verge.   RECOMMENDATIONS:  1.  Await pathology results.  2.  Avoid nonsteroidals including aspirin for the next three weeks.  3.  High-fiber diet with liberal fluid intake.  4.  Repeat colonoscopy depending on pathology results.  5.  Outpatient followup as need arises in the future.       JNM/MEDQ  D:  12/05/2004  T:  12/07/2004  Job:  [id number]   cc:   Renaye Rakers, M.D.  [phone], [address]  [place]  [place] [address]  Fax: [phone]

## 2010-11-07 NOTE — Op Note (Signed)
[place]. [facility]  Patient:    Beverly Lewis, Beverly Lewis                    MRN: [id number] Proc. Date: 04/19/00 Adm. Date:  [id number] Attending:  Jacki Cones                           Operative Report  PREOPERATIVE DIAGNOSIS:  L4-5 herniated nucleus pulposus with free fragment.  POSTOPERATIVE DIAGNOSIS:  L4-5 herniated nucleus pulposus with free fragment.  PROCEDURE:  Left L4-5 microdiskectomy.  Removal of free fragments.  SURGEON:  Mark C. Ophelia Charter, M.D.  ASSISTANT:  Quinn Plowman, P.A.  ANESTHESIA:  GOT.  ESTIMATED BLOOD LOSS:  200 mL.  COMPLICATIONS:  None.  OPERATIVE PROCEDURE:  After the induction of general anesthesia using oral tracheal intubation, the patient was placed on the [place] frame.  She got a little light with anesthesia and her legs slipped off due to her large size. She was not stable in the [place] frame; therefore she was transferred back to the supine position on a regular transfer bed and then back onto a regular operating room table with chest rolls.  This positioned her well and she was stable in this position.  The back was prepped with Duraprep. Usual area was squared off with towels and then on dry drapes.  Foley catheter had been inserted prior to flipping the patient over for prepping and draping, since on the [place] frame she had a large release of urine.  Midline incision was made after crosstable lateral x-ray showed the needle was at L5-S1.  The patient was extremely large (300 pounds) and a deep layer of adipose was divided to get down to the spinous processes.  The fascia was pulled off the spinous process with Bovie electrocautery.  Finger and Cobb elevator was used to percuss the sacrum.  The L5-S1 was palpated and the L4-5 level was identified.  Subperiosteal dissection was performed on the lamina, and then a Kerrison was used to perform the laminotomy.  Next, the lamina was removed partially at L5 on  the left, due to the inferior migration of free fragment.  The nerve root was extremely tight and there was ligaments; bone and ligaments had to be removed laterally to decompress the dura.  Dura was identified, disk space was visualized.  Several passes were made through the disk space, removing a moderate amount of degenerative material.  The patient showed a pocket that extended directly down behind the nerve root.  The nerve root was extremely tight and the dura would barely mobilize.  Some passes were made with the hockey stick and the nerve hook.  Immediately several clumps of disk material were sucked up in the neuro sucker tip.  Clumps were delivered and Epstein curets were used to clean the pocket out of all disk material, removing it with a micro pituitary and with the suction.  Nerve root was then loose, easily mobilized.  The dura was no longer tight, and several passes were made with a hockey stick with no further areas of compression.  Axilla of the nerve root and foramina anterior to the nerve root was carefully checked, there were no areas of remaining disk present.  The wall of the free fragment pocket that had some fibrous tissues around it was also removed.  Two large dural veins from the gutter were coagulated.  The disk space was irrigated and after thorough irrigation  inspection of the dura and nerve root showed it was no longer dorsally displaced.  The nerve root was free.  The dura was decompressed; a hockey stick could be easily passed anterior to the dura against the bone with no areas of compression and 2-3 mm of empty space between the dura and the L5 body; also the L4-5 disk space as well as the L4 body and the disk space.  The fascia was closed with 0 Vicryl, 2-0 Vicryl in subcutaneous tissue.  Skin staple closure.  The patient tolerated the procedure well and was transferred to the recovery room in stable condition. DD:  04/19/00 TD:  04/20/00 Job:  [id number]

## 2010-11-07 NOTE — Procedures (Signed)
[place]. [facility]  Patient:    ERCELL, PERLMAN Visit Number: [id number] MRN: [id number]          Service Type: END Location: ENDO Attending Physician:  Charna Elizabeth Dictated by:   Anselmo Rod, M.D. Proc. Date: 12/15/01 Admit Date:  12/15/2001                             Procedure Report  DATE OF BIRTH:  Apr 10, 1929  PROCEDURE PERFORMED:  Flexible sigmoidoscopy up to 10 cm.  ENDOSCOPIST:  Anselmo Rod, M.D.  INSTRUMENT USED:  Olympus video colonoscope.  INDICATIONS FOR PROCEDURE:  A 77 year old [place] American female undergoing flexible sigmoidoscopy to reevaluate the left colon for a tubular adenoma removed two years ago.  PREPROCEDURE PREPARATION:  Informed consent was procured from the patient. The patient was prepped with a gallon of NuLytely the night prior to the procedure and fasted for eight hours prior to the procedure.  PREPROCEDURE PHYSICAL:  VITAL SIGNS:  Stable.  NECK:  Supple.  CHEST:  Clear to auscultation, S1 and S2 regular.  ABDOMEN:  Soft with normal bowel sounds.  DESCRIPTION OF PROCEDURE:  The patient was placed in the left lateral decubitus position and sedated with 30 mg of Demerol and 3 mg of Versed intravenously.  Once the patient was adequately sedated, maintained on low flow oxygen and continuous cardiac monitoring, the Olympus video colonoscope was advanced from the rectum to 10 cm with extreme difficulty.  There was a large amount of solid stool in the colon and the procedure was aborted at this point with plans to reprep the patient and redo the procedure in the near future. Dictated by:   Anselmo Rod, M.D. Attending Physician:  Charna Elizabeth DD:  12/15/01 TD:  12/17/01 Job: [id number]

## 2010-11-07 NOTE — Op Note (Signed)
NAME:  Beverly Lewis, Beverly Lewis                     ACCOUNT NO.:  [id number]   MEDICAL RECORD NO.:  [id number]                   PATIENT TYPE:  AMB   LOCATION:  ENDO                                 FACILITY:  [facility]   PHYSICIAN:  Charna Elizabeth, M.D.                   DATE OF BIRTH:  10/27/1928   DATE OF PROCEDURE:  02/22/2002  DATE OF DISCHARGE:                                 OPERATIVE REPORT   PREOPERATIVE DIAGNOSIS:  Flexible sigmoidoscopy up to 100 cm.   ENDOSCOPIST:  Charna Elizabeth, M.D.   INSTRUMENT USED:  Olympus video colonoscope.   INDICATIONS:  A 77 year old African-American female with a tubular adenoma  in the left colon.  Repeat flexible sigmoidoscopy is being done to rule out  recurrent lesions.  The patient also has extensive diverticulosis on the  left side on previous colonoscopy done last year.   PREPROCEDURE PREPARATION:  Informed consent was procured from the patient.  The patient was fasted for eight hours prior to the procedure and prepped  with a bottle of magnesium citrate and two Fleet's enemas the morning of the  procedure.   PREPROCEDURE PHYSICAL:  VITAL SIGNS:  The patient had stable vital signs.  NECK:  Supple.  CHEST:  Clear to auscultation.  S1, S2 regular.  ABDOMEN:  Soft with normal bowel sounds.   DESCRIPTION OF PROCEDURE:  The patient was placed in the left lateral  decubitus position and sedated with 50 mg of Demerol and 5 mg of Versed  intravenously.  Once the patient was adequately sedate and maintained on low-  flow oxygen and continuous cardiac monitoring, the Olympus video colonoscope  was advanced from the rectum to 100 cm without difficulty.  Visualization  was adequate.  No masses, polyps, erosions, ulcerations, etc., were seen.  There was left-sided diverticulosis.   IMPRESSION:  Essentially normal flexible sigmoidoscopy up to 100 cm except  for left-sided diverticulosis.   RECOMMENDATIONS:  1. In view of the patient's finding on the  recent colonoscopy, repeat     colorectal cancer screening is recommended in the summer of 2006.     Further recommendations made in follow-up.  2. A high-fiber diet has been discussed with her in great detail with regard     to her diverticular disease, and     brochures have been given to her for her education.  3. The patient is to report any abnormal GI symptoms to the office at the     earliest and follow up in the office on a p.r.n. basis.                                                 Charna Elizabeth, M.D.    JM/MEDQ  D:  02/22/2002  T:  02/22/2002  Job:  [id number]   cc:   [name], M.D.

## 2010-11-07 NOTE — Discharge Summary (Signed)
[place]. [facility]  Patient:    Beverly Lewis, Beverly Lewis                    MRN: [id number] Adm. Date:  [id number] Disc. Date: 04/20/00 Attending:  Jacki Cones Dictator:   Quinn Plowman, P.A.C.                           Discharge Summary  DATE OF BIRTH:  Jan 16, 1929.  ADMISSION DIAGNOSIS:  L4-L5 herniated nucleus pulposus.  DISCHARGE DIAGNOSIS:  L4-L5 herniated nucleus pulposus.  ADDITIONAL DIAGNOSES: 1. Osteoarthritis. 2. Hypertension. 3. Hypercholesterolemia. 4. Morbid obesity. 5. Status post ectopic pregnancies x 2. 6. Status post appendectomy. 7. Tobacco abuse. 8. History of transient ischemic attack. 9. History of gastric ulcers.  PROCEDURE:  On April 19, 2000, the patient underwent an L4-L5 microdiskectomy with removal of large free fragments performed by Loraine Leriche C. Ophelia Charter, M.D., with assistance by Quinn Plowman, P.A.C., and the patient tolerated the procedure well.  HISTORY OF PRESENT ILLNESS:  [name] is a 77 year old African-American female with progressively worsening low back pain x 4 years.  Most recently she has had left leg radiation that has affected her activities of daily living such as walking or exercising due to the severe amount of pain.  She also relates some numbness and tingling going down her left leg to the dorsal of her left foot.  She has failed conservative therapy to include nonsteroidal anti-inflammatory drugs, narcotics, corset and home exercise. The patient states she has been unable to exercise and complete her activities of daily living such as homemaking or working because of the severe amount of pain. The patient says it is worse with walking, better laying supine.  She rates her pain as a 10/10 with 10 being the worst pain every experienced.  She denies any other associated symptoms today.  HOSPITAL COURSE:  The patient was admitted on April 19, 2000, where she underwent an L4-L5  microdiscectomy with removal of large free fragments.  The patient tolerated the procedure well.  She ambulated that night. She did take zero pain medication throughout the night.  No fever, chills, erythema or significant drainage from the incision site.  On postoperative day #1, April 20, 2000, she ambulated in the halls. She did not take any pain medications. She has no pain in the lower extremities and her sciatic nerve is intact.  Pulses in the lower extremities are intact.  EHL, anterior tip, gastrocnemius are +5/5 strength.  Her temperature was 99.1, vital signs stable, afebrile.  Once again, she took no pain medication and she wished to be discharged on this day.  LABORATORY WORK:  Her chest x-ray reveals mild cardiomegaly and linear scarring, atelectasis at the left base.  Her EKG reveals normal sinus rhythm, nonspecific ST abnormality which was clear by anesthesia preoperatively.  Urinalysis negative.  Metabolic profile revealed sodium 138, potassium 4.3, chloride 105, carbon dioxide 26, glucose 120, BUN 23, creatinine 1.0.  AST 21, ALT 23, alkaline phosphatase 82.  PT INR 13.4, INR 1.1. White count 8.7, hemoglobin 14.7, hematocrit 42.6, platelet count 257.  DISCHARGE INSTRUCTIONS:  The patient will resume her home medications.  She will be discharged on Vicodin one to two q.3-4h. p.r.n. severe pain. No alcohol, driving, or operating heavy machinery while on this medication. Also, we will see her back in the office at [facility] at [address], [place], [place]. Call to make  the appointment at [phone] with Dr. Ophelia Charter in one week.  A dressing change will be done before discharge.  Wound care instructions will be given per nursing staff. She may remove her dressing in 48 to 72 hours and may shower.  The patient understood these instructions and had no further questions at the time of discharge.  CONDITION ON DISCHARGE:   Stable.  DIAGNOSIS:  L4-L5 herniated nucleus pulposus. DD:  04/20/00 TD:  04/20/00 Job: [id number]

## 2011-01-09 ENCOUNTER — Encounter: Payer: Self-pay | Admitting: Vascular Surgery

## 2011-01-27 ENCOUNTER — Ambulatory Visit (INDEPENDENT_AMBULATORY_CARE_PROVIDER_SITE_OTHER): Payer: Medicare Other | Admitting: Vascular Surgery

## 2011-01-27 ENCOUNTER — Encounter: Payer: Self-pay | Admitting: Vascular Surgery

## 2011-01-27 ENCOUNTER — Ambulatory Visit
Admission: RE | Admit: 2011-01-27 | Discharge: 2011-01-27 | Disposition: A | Discharge status: Home / Self Care | Payer: Medicare Other | Source: Ambulatory Visit | Attending: Vascular Surgery | Admitting: Vascular Surgery

## 2011-01-27 VITALS — BP 151/73 | HR 68 | Resp 20 | Ht 63.0 in | Wt 240.0 lb

## 2011-01-27 DIAGNOSIS — I712 Thoracic aortic aneurysm, without rupture: Secondary | ICD-10-CM

## 2011-01-27 DIAGNOSIS — Z9889 Other specified postprocedural states: Secondary | ICD-10-CM

## 2011-01-27 DIAGNOSIS — I745 Embolism and thrombosis of iliac artery: Secondary | ICD-10-CM

## 2011-01-27 MED ORDER — IOHEXOL 350 MG/ML SOLN
100.0000 mL | Freq: Once | INTRAVENOUS | Status: AC | PRN
  Administered 2011-01-27: 100 mL via INTRAVENOUS

## 2011-01-27 NOTE — Progress Notes (Signed)
Subjective:     Patient ID: Beverly Lewis, female   DOB: [birth year], 77 y.o.   MRN: [id number]  HPI this 77 year old female returns for followup regarding an urgent aortic stent graft-aorto by common iliac-which was inserted on 06/28/2010 for symptomatic abdominal aortic aneurysm. She did well and was discharged on the fourth postoperative day. She has done reasonably well since that time but did have a fall in her home a few weeks ago injuring her left leg. She also has some chronic back symptoms. She has bilateral claudication symptoms left worse than right. She denies any abdominal pain nausea vomiting or change in appetite.  Today I ordered a CT angiogram which was done [facility] imaging and I reviewed this by computer. The stent graft remains in good position with good flow in the renal arteries. There appears to be some thrombus buildup in the left limb of the Endo graft causing significant narrowing. There also appears to be severe external iliac occlusive disease distal to this point. The aneurysm sac continued to be in the 55-60 mm range as it was preoperatively.   Past Medical History  Diagnosis Date  . Hypertension   . Hyperlipidemia   . Cerebrovascular accident history of mild cerebrovascular accident which caused some visual disturbance.   . Diabetes mellitus   . Morbid obesity     History  Substance Use Topics  . Smoking status: Current Everyday Smoker -- 0.5 packs/day for 65 years    Types: Cigarettes  . Smokeless tobacco: Not on file  . Alcohol Use: No     She does not consume alcohol on a regular basis.    Family History  Problem Relation Age of Onset  . Heart disease Mother   . Hypertension Mother   . Heart disease Father   . Hypertension Father     Allergies  Allergen Reactions  . Asa Buff (Mag (Aspirin Buffered)     In tolerance in higher dosages.    Current outpatient prescriptions:allopurinol (ZYLOPRIM) 100 MG tablet, Take 100 mg by mouth 2 (two)  times daily.  , Disp: , Rfl: ;  amLODipine (NORVASC) 10 MG tablet, Take 10 mg by mouth daily.  , Disp: , Rfl: ;  atorvastatin (LIPITOR) 20 MG tablet, Take 20 mg by mouth daily.  , Disp: , Rfl: ;  multivitamin (THERAGRAN) per tablet, Take 1 tablet by mouth daily.  , Disp: , Rfl:  sitaGLIPtin (JANUVIA) 25 MG tablet, Take 25 mg by mouth daily.  , Disp: , Rfl: ;  zolpidem (AMBIEN CR) 12.5 MG CR tablet, Take 12.5 mg by mouth at bedtime as needed.  , Disp: , Rfl: ;  eprosartan-hydrochlorothiazide (TEVETEN HCT) 600-12.5 MG per tablet, Take 1 tablet by mouth daily.  , Disp: , Rfl: ;  HYDROcodone-acetaminophen (LORCET) 10-650 MG per tablet, , Disp: , Rfl: ;  indomethacin (INDOCIN) 25 MG capsule, , Disp: , Rfl:  NEXIUM 40 MG capsule, , Disp: , Rfl: ;  NIASPAN 500 MG CR tablet, , Disp: , Rfl: ;  TEVETEN HCT 600-25 MG per tablet, , Disp: , Rfl:  No current facility-administered medications for this visit. Facility-Administered Medications Ordered in Other Visits: iohexol (OMNIPAQUE) 350 MG/ML injection 100 mL, 100 mL, Intravenous, Once PRN, Medication Radiologist, 100 mL at 01/27/11 1121  Filed Vitals:   01/27/11 1222  Height: 5\' 3"  (1.6 m)  Weight: 240 lb (108.863 kg)    Body mass index is 42.51 kg/(m^2).       Review of Systems she  denies chest pain but does experience dyspnea on exertion which is chronic. No hemoptysis PND or orthopnea. All other systems are stable.     Objective:   Physical Exam blood pressure 151/73 heart rate 68 respirations 20 She is alert and oriented x3. She is in no apparent distress. Chest clear to auscultation with no rhonchi or wheezing. Cardiovascular exam reveals a regular rhythm with no murmurs. Abdomen is obese no pulsatile masses noted She has a 3+ right femoral 1+ left femoral pulse palpable no distal pulses are palpable. Both suprainguinal incisions remain well healed.    Assessment:    there appears to be some thrombus buildup in the midportion of the left  limb of the aorto biiliac stent graft. There also appears to be significant left external iliac occlusive disease. There is significant risk of acute occlusion of the left limb of the graft.    Plan:     We'll undergo aortobifemoral angiography to further delineate the left limb of the stent graft. This will be done on August 21 by Dr. Myra Gianotti possible PTA and stenting of her left external iliac artery and possible intervention of the left limb of the Endo graft.

## 2011-02-10 ENCOUNTER — Encounter: Payer: Self-pay | Admitting: Vascular Surgery

## 2011-02-10 NOTE — Progress Notes (Signed)
The patient was scheduled for an angiogram today by Dr. Myra Gianotti. She has near occlusion of the left limb of a recently placed aortic stent graft. I evaluated her on August 7. I discussed with her daughter at length the need to perform this procedure since she has impending occlusion of the left limb of the graft. She has severe left external iliac disease distal to the left limb. She will likely present in the near future with ischemia  of the left leg and will require right to left femoral-femoral bypass grafting. We will again try to notify the patient and her daughter to reschedule this procedure.

## 2011-03-03 ENCOUNTER — Ambulatory Visit (HOSPITAL_COMMUNITY)
Admission: RE | Admit: 2011-03-03 | Discharge: 2011-03-03 | Disposition: A | Discharge status: Home / Self Care | Payer: Medicare Other | Source: Ambulatory Visit | Attending: Surgery | Admitting: Surgery

## 2011-03-03 DIAGNOSIS — Y831 Surgical operation with implant of artificial internal device as the cause of abnormal reaction of the patient, or of later complication, without mention of misadventure at the time of the procedure: Secondary | ICD-10-CM | POA: Insufficient documentation

## 2011-03-03 DIAGNOSIS — Z01810 Encounter for preprocedural cardiovascular examination: Secondary | ICD-10-CM | POA: Insufficient documentation

## 2011-03-03 DIAGNOSIS — I70219 Atherosclerosis of native arteries of extremities with intermittent claudication, unspecified extremity: Secondary | ICD-10-CM | POA: Insufficient documentation

## 2011-03-03 DIAGNOSIS — I745 Embolism and thrombosis of iliac artery: Secondary | ICD-10-CM

## 2011-03-03 DIAGNOSIS — I701 Atherosclerosis of renal artery: Secondary | ICD-10-CM | POA: Insufficient documentation

## 2011-03-03 DIAGNOSIS — I708 Atherosclerosis of other arteries: Secondary | ICD-10-CM | POA: Insufficient documentation

## 2011-03-03 DIAGNOSIS — T82898A Other specified complication of vascular prosthetic devices, implants and grafts, initial encounter: Secondary | ICD-10-CM | POA: Insufficient documentation

## 2011-03-03 HISTORY — PX: OTHER SURGICAL HISTORY: SHX169

## 2011-03-03 LAB — POCT I-STAT, CHEM 8
BUN: 24 mg/dL — ABNORMAL HIGH (ref 6–23)
Calcium, Ion: 1.24 mmol/L (ref 1.12–1.32)
Chloride: 107 mEq/L (ref 96–112)
Glucose, Bld: 99 mg/dL (ref 70–99)
TCO2: 25 mmol/L (ref 0–100)

## 2011-03-03 LAB — GLUCOSE, CAPILLARY
Glucose-Capillary: 110 mg/dL — ABNORMAL HIGH (ref 70–99)
Glucose-Capillary: 85 mg/dL (ref 70–99)
Glucose-Capillary: 126 mg/dL — ABNORMAL HIGH (ref 70–99)

## 2011-03-03 LAB — POCT ACTIVATED CLOTTING TIME: Activated Clotting Time: 166 seconds

## 2011-03-20 LAB — CBC
HCT: 39.9
MCV: 97.7
Platelets: 209
RDW: 14.8

## 2011-03-20 LAB — COMPREHENSIVE METABOLIC PANEL
AST: 19
Albumin: 3.5
BUN: 19
Calcium: 9.3
Creatinine, Ser: 0.91
GFR calc Af Amer: >60
Total Bilirubin: 0.5
Total Protein: 6.7

## 2011-03-20 LAB — PROTIME-INR
INR: 1.1
Prothrombin Time: 14.2

## 2011-03-20 LAB — URINE CULTURE

## 2011-03-20 LAB — URINE MICROSCOPIC-ADD ON

## 2011-03-20 LAB — URINALYSIS, ROUTINE W REFLEX MICROSCOPIC
Hgb urine dipstick: NEGATIVE
Specific Gravity, Urine: 1.035 — ABNORMAL HIGH
pH: 5

## 2011-03-20 LAB — DIFFERENTIAL
Basophils Relative: 1
Lymphocytes Relative: 32
Monocytes Relative: 10
Neutro Abs: 3.6
Neutrophils Relative %: 56

## 2011-03-20 LAB — POCT I-STAT, CHEM 8
Creatinine, Ser: 1.1
Glucose, Bld: 98
HCT: 41
Hemoglobin: 13.9
Sodium: 142
TCO2: 28

## 2011-03-20 LAB — APTT: aPTT: 25

## 2011-03-20 LAB — POCT CARDIAC MARKERS: CKMB, poc: <1 — ABNORMAL LOW

## 2011-03-21 NOTE — Op Note (Signed)
Beverly Lewis, Beverly Lewis           ACCOUNT NO.:  [id number]  MEDICAL RECORD NO.:  [id number]  LOCATION:  SDSC                         FACILITY:  [facility]  PHYSICIAN:  Juleen China IV, MDDATE OF BIRTH:  1928/07/21  DATE OF PROCEDURE:  03/03/2011 DATE OF DISCHARGE:                              OPERATIVE REPORT   PREOPERATIVE DIAGNOSIS:  Bilateral claudication, left greater than right.  POSTOPERATIVE DIAGNOSIS:  Bilateral claudication, left greater than right.  PROCEDURE PERFORMED: 1. Bilateral common femoral artery ultrasound-guided access. 2. Catheter in aorta x2. 3. Abdominal aortogram. 4. Bilateral lower extremity runoff. 5. Stent in left external iliac artery.  INDICATIONS:  Beverly Lewis is a 77 year old female who underwent urgent endovascular repair of the symptomatic aneurysm.  On her followup imaging, she was found to have some thrombus within the left limb of her graft and a high-grade iliac stenosis.  She comes in today for further evaluation of possible intervention.  PROCEDURE:  The patient was identified in the holding area and taken to room #8, placed supine on the table.  Both groins were prepped and draped in usual fashion.  A time-out was called.  The right femoral artery was accessed under ultrasound guidance with micropuncture needle. A digital ultrasound image was required.  There was diffuse disease within the common femoral artery on the right with calcification over an 0.035.  A 5-French sheath was placed and the Omniflush catheter was placed to the level L1.  Abdominal aortogram was obtained.  Catheter was pulled down to within the graft and bilateral lower extremity runoff was performed.  FINDINGS:  Aortogram:  There were single renal arteries bilaterally which were patent.  There was approximately 40-50% right renal artery stenosis.  A stent graft was visualized within the infrarenal abdominal aorta.  There was no evidence of endo leak.  Both  limbs opacify, however, there was luminal narrowing within the left limb of the graft. The graft limbs were crossed.  CT scan has shown thrombus within the left limb of the graft.  Diffuse disease was seen within the right external iliac artery without focal stenosis.  There was a high-grade left external iliac artery stenosis approximately 90%.  Right lower extremity:  The right common femoral artery is diseased but patent.  The right profunda femoral artery is patent.  The right superficial femoral artery was occluded at its origin.  There is reconstitution of the popliteal artery behind the knee.  The below-knee popliteal artery is patent with 3-vessel runoff.  Left lower extremity:  The left common femoral artery was patent.  The left profunda femoral artery was patent.  The left superficial femoral artery was occluded at its origin.  There was reconstitution of the popliteal artery at the level of patella.  The below-knee popliteal artery was widely patent.  There was 3-vessel runoff.  At this point in time, the decision was made to intervene.  I obtained access into the left common femoral artery using a micropuncture needle under ultrasound guidance.  Over a Bentson wire, a 5-French sheath was placed.  I then switched the Bentson wire out for a 0.014 Spartacore wire.  The patient was then systemically heparinized.  I elected to primarily stent  the lesion within the left external iliac artery.  A Cook Zilver 8 x 40 self-expanding stent was advanced over the 0.014 wire and deployed in the left external iliac artery.  A 7 x 30 balloon was used to mold the stent to confirmation.  Completion arteriogram revealed resolution of the stenosis within the left external iliac artery.  At this point in time, decision was made to terminate the procedure. Catheters and wires removed.  The patient was taken to the holding area for sheath pull once her coagulation profile  corrects.  IMPRESSION: 1. High-grade left external iliac artery stenosis successfully seated     using a Cook Zilver 8 x 40 self-expanding stent. 2. Filling defect within the left limb of the endovascular aneurysm     graft consistent with known thrombus. 3. Diffuse right external iliac artery disease without focal stenosis. 4. Bilateral superficial femoral artery occlusion with 3-vessel runoff     and reconstitution of a below-knee popliteal artery.     Jorge Ny, MD     VWB/MEDQ  D:  03/03/2011  T:  03/03/2011  Job:  [id number]  Electronically Signed by Arelia Longest IV MD on 03/21/2011 09:57:08 AM

## 2011-03-26 ENCOUNTER — Other Ambulatory Visit: Payer: Self-pay | Admitting: Lab

## 2011-03-26 DIAGNOSIS — I70219 Atherosclerosis of native arteries of extremities with intermittent claudication, unspecified extremity: Secondary | ICD-10-CM

## 2011-03-26 DIAGNOSIS — Z48812 Encounter for surgical aftercare following surgery on the circulatory system: Secondary | ICD-10-CM

## 2011-03-30 ENCOUNTER — Encounter: Payer: Self-pay | Admitting: Vascular Surgery

## 2011-03-31 ENCOUNTER — Ambulatory Visit (INDEPENDENT_AMBULATORY_CARE_PROVIDER_SITE_OTHER): Payer: Medicare Other | Admitting: Vascular Surgery

## 2011-03-31 ENCOUNTER — Encounter: Payer: Self-pay | Admitting: Vascular Surgery

## 2011-03-31 VITALS — BP 141/85 | HR 79 | Resp 20 | Ht 63.0 in | Wt 230.0 lb

## 2011-03-31 DIAGNOSIS — I70219 Atherosclerosis of native arteries of extremities with intermittent claudication, unspecified extremity: Secondary | ICD-10-CM

## 2011-03-31 DIAGNOSIS — I714 Abdominal aortic aneurysm, without rupture: Secondary | ICD-10-CM

## 2011-03-31 DIAGNOSIS — Z48812 Encounter for surgical aftercare following surgery on the circulatory system: Secondary | ICD-10-CM

## 2011-03-31 DIAGNOSIS — I739 Peripheral vascular disease, unspecified: Secondary | ICD-10-CM

## 2011-03-31 NOTE — Progress Notes (Signed)
Subjective:     Patient ID: Beverly Lewis, female   DOB: 1929/04/08, 77 y.o.   MRN: [id number]  HPI this 77 year old female returns today for followup regarding her aortic stent graft placed as an emergency in January of 2012 for symptomatic infrarenal abdominal aortic aneurysm. Her CT angiogram done in August of 2012 revealed thrombus in the left limb of the stent graft and severe external iliac occlusive disease. Dr. Myra Gianotti performed an angiogram on September 20 12th and perform PTA and stenting of the left external iliac system. He also has bilateral superficial femoral occlusions. She states that she does develop calf claudication symptoms after an relating short distances but this is not limiting her. She has no rest pain or history of nonhealing ulcers or gangrene.  Past Medical History  Diagnosis Date  . Hypertension   . Hyperlipidemia   . Cerebrovascular accident history of mild cerebrovascular accident which caused some visual disturbance.   . Diabetes mellitus   . Morbid obesity     History  Substance Use Topics  . Smoking status: Current Everyday Smoker -- 0.5 packs/day for 65 years    Types: Cigarettes  . Smokeless tobacco: Not on file  . Alcohol Use: No     She does not consume alcohol on a regular basis.    Family History  Problem Relation Age of Onset  . Heart disease Mother   . Hypertension Mother   . Heart disease Father   . Hypertension Father     Allergies  Allergen Reactions  . Asa Buff (Mag (Aspirin Buffered)     In tolerance in higher dosages.    Current outpatient prescriptions:allopurinol (ZYLOPRIM) 100 MG tablet, Take 100 mg by mouth 2 (two) times daily.  , Disp: , Rfl: ;  amLODipine (NORVASC) 10 MG tablet, Take 10 mg by mouth daily.  , Disp: , Rfl: ;  atorvastatin (LIPITOR) 20 MG tablet, Take 20 mg by mouth daily.  , Disp: , Rfl: ;  clopidogrel (PLAVIX) 75 MG tablet, daily. , Disp: , Rfl:  eprosartan-hydrochlorothiazide (TEVETEN HCT) 600-12.5 MG  per tablet, Take 1 tablet by mouth daily.  , Disp: , Rfl: ;  HYDROcodone-acetaminophen (LORCET) 10-650 MG per tablet, , Disp: , Rfl: ;  indomethacin (INDOCIN) 25 MG capsule, , Disp: , Rfl: ;  JANUVIA 100 MG tablet, daily. , Disp: , Rfl: ;  multivitamin (THERAGRAN) per tablet, Take 1 tablet by mouth daily.  , Disp: , Rfl: ;  NEXIUM 40 MG capsule, , Disp: , Rfl:  NIASPAN 500 MG CR tablet, , Disp: , Rfl: ;  sitaGLIPtin (JANUVIA) 25 MG tablet, Take 25 mg by mouth daily.  , Disp: , Rfl: ;  TEVETEN HCT 600-25 MG per tablet, , Disp: , Rfl: ;  zolpidem (AMBIEN CR) 12.5 MG CR tablet, Take 12.5 mg by mouth at bedtime as needed.  , Disp: , Rfl:   BP 141/85  Pulse 79  Resp 20  Ht 5\' 3"  (1.6 m)  Wt 230 lb (104.327 kg)  BMI 40.74 kg/m2  SpO2 96%  Body mass index is 40.74 kg/(m^2).         Review of Systems he denies any new cardiac symptoms such as chest pain she does have dyspnea on exertion. He has back problems which are chronic. He denies any other new symptoms.  Today I ordered lower extremity arterial Dopplers which are reviewed and interpreted. ABI on the left is 0.49 on the right is 0.35.     Objective:  Physical Exam blood pressure 141/85 heart rate 79 respirations 20 Generally she is an obese female who is elderly but in no apparent distress she is alert and oriented x3 Chest clear with no rhonchi or wheezing Abdomen obese no palpable masses or pain She has a 3+ femoral pulse palpable bilaterally with well-healed incisions. There are no distal pulses palpable. No evidence of ischemia Neurologic exam normal    Assessment:    doing well post urgent aortobiiliac stent graft insertion in January 2012 for symptomatic aortic aneurysm. Severe iliac occlusive disease and some narrowing due to thrombus in the left limb of stent graft       Plan:     Patient at high risk of developing occlusion of left limb of stent graft. If that occurs she will need right to left femoral-femoral  bypass. Return in 6 months with CT angiogram of abdomen and pelvis and ABI

## 2011-04-01 NOTE — Progress Notes (Signed)
Addended by: Sharee Pimple on: 04/01/2011 09:31 AM   Modules accepted: Orders

## 2011-09-25 ENCOUNTER — Other Ambulatory Visit: Payer: Self-pay | Admitting: Vascular Surgery

## 2011-09-29 ENCOUNTER — Encounter: Payer: Self-pay | Admitting: Vascular Surgery

## 2011-09-29 ENCOUNTER — Ambulatory Visit
Admission: RE | Admit: 2011-09-29 | Discharge: 2011-09-29 | Disposition: A | Discharge status: Home / Self Care | Payer: Medicare Other | Source: Ambulatory Visit | Attending: Vascular Surgery | Admitting: Vascular Surgery

## 2011-09-29 ENCOUNTER — Ambulatory Visit (INDEPENDENT_AMBULATORY_CARE_PROVIDER_SITE_OTHER): Payer: Medicare Other | Admitting: Vascular Surgery

## 2011-09-29 ENCOUNTER — Encounter (INDEPENDENT_AMBULATORY_CARE_PROVIDER_SITE_OTHER): Payer: Medicare Other | Admitting: *Deleted

## 2011-09-29 VITALS — BP 135/76 | HR 58 | Resp 18 | Ht 64.0 in | Wt 250.0 lb

## 2011-09-29 DIAGNOSIS — I70219 Atherosclerosis of native arteries of extremities with intermittent claudication, unspecified extremity: Secondary | ICD-10-CM

## 2011-09-29 DIAGNOSIS — I714 Abdominal aortic aneurysm, without rupture: Secondary | ICD-10-CM | POA: Insufficient documentation

## 2011-09-29 DIAGNOSIS — I70309 Unspecified atherosclerosis of unspecified type of bypass graft(s) of the extremities, unspecified extremity: Secondary | ICD-10-CM

## 2011-09-29 DIAGNOSIS — R918 Other nonspecific abnormal finding of lung field: Secondary | ICD-10-CM

## 2011-09-29 DIAGNOSIS — Z48812 Encounter for surgical aftercare following surgery on the circulatory system: Secondary | ICD-10-CM

## 2011-09-29 DIAGNOSIS — R222 Localized swelling, mass and lump, trunk: Secondary | ICD-10-CM

## 2011-09-29 MED ORDER — IOHEXOL 350 MG/ML SOLN
100.0000 mL | Freq: Once | INTRAVENOUS | Status: AC | PRN
  Administered 2011-09-29: 100 mL via INTRAVENOUS

## 2011-09-29 NOTE — Progress Notes (Signed)
Addended by: Sharee Pimple on: 09/29/2011 11:46 AM   Modules accepted: Orders

## 2011-09-29 NOTE — Progress Notes (Signed)
Subjective:     Patient ID: Beverly Lewis, female   DOB: 03/20/1929, 78 y.o.   MRN: [id number]  HPI this 78 year old female returns for further followup regarding her aortic stent graft which was inserted emergently in January of 2012 for enlarging aortic aneurysm. She's done well since that time. Today she had a CT angiogram which I have reviewed and reveals no change in the size of the aneurysm sac a maximum of 5.4 cm. There is no evidence of endoleak. There was suggestion of some thrombus in the left limb of the graft previously. Today that is not as prominent and it is widely patent. She denies any chest pain or hemoptysis. There is a question of a right middle lobe nodular mass measuring 11 mm in size on today's study and the radiologist has recommended CT of the chest.  Past Medical History  Diagnosis Date  . Hypertension   . Hyperlipidemia   . Cerebrovascular accident history of mild cerebrovascular accident which caused some visual disturbance.   . Diabetes mellitus   . Morbid obesity     History  Substance Use Topics  . Smoking status: Current Everyday Smoker -- 0.5 packs/day for 65 years    Types: Cigarettes  . Smokeless tobacco: Never Used  . Alcohol Use: No     She does not consume alcohol on a regular basis.    Family History  Problem Relation Age of Onset  . Heart disease Mother   . Hypertension Mother   . Heart disease Father   . Hypertension Father     Allergies  Allergen Reactions  . Asa Buff (Mag (Buffered Aspirin)     In tolerance in higher dosages.    Current outpatient prescriptions:allopurinol (ZYLOPRIM) 100 MG tablet, Take 100 mg by mouth 2 (two) times daily.  , Disp: , Rfl: ;  amLODipine (NORVASC) 10 MG tablet, Take 10 mg by mouth daily.  , Disp: , Rfl: ;  atorvastatin (LIPITOR) 20 MG tablet, Take 20 mg by mouth daily.  , Disp: , Rfl: ;  clopidogrel (PLAVIX) 75 MG tablet, daily. , Disp: , Rfl:  eprosartan-hydrochlorothiazide (TEVETEN HCT) 600-12.5 MG  per tablet, Take 1 tablet by mouth daily.  , Disp: , Rfl: ;  HYDROcodone-acetaminophen (LORCET) 10-650 MG per tablet, , Disp: , Rfl: ;  indomethacin (INDOCIN) 25 MG capsule, , Disp: , Rfl: ;  JANUVIA 100 MG tablet, daily. , Disp: , Rfl: ;  multivitamin (THERAGRAN) per tablet, Take 1 tablet by mouth daily.  , Disp: , Rfl: ;  NEXIUM 40 MG capsule, , Disp: , Rfl:  NIASPAN 500 MG CR tablet, , Disp: , Rfl: ;  sitaGLIPtin (JANUVIA) 25 MG tablet, Take 25 mg by mouth daily.  , Disp: , Rfl: ;  TEVETEN HCT 600-25 MG per tablet, , Disp: , Rfl: ;  zolpidem (AMBIEN CR) 12.5 MG CR tablet, Take 12.5 mg by mouth at bedtime as needed.  , Disp: , Rfl:  No current facility-administered medications for this visit. Facility-Administered Medications Ordered in Other Visits: iohexol (OMNIPAQUE) 350 MG/ML injection 100 mL, 100 mL, Intravenous, Once PRN, Medication Radiologist, MD, 100 mL at 09/29/11 1021  BP 135/76  Pulse 58  Resp 18  Ht 5\' 4"  (1.626 m)  Wt 250 lb (113.399 kg)  BMI 42.91 kg/m2  Body mass index is 42.91 kg/(m^2).         Review of Systems chest pain, hemoptysis, does complain of dyspnea on exertion and occasional left flank discomfort when lying  on left side there    Objective:   Physical Exam blood pressure 100/76 heart rate 58 respirations 18 General well-developed well-nourished obese female in no apparent stress alert and oriented x3 Lungs no rhonchi or wheezing Cardiovascular regular rhythm no murmurs carotid pulses 3+ no audible bruits Abdomen obese no palpable masses 3+ femoral pulses bilaterally with well-perfused lower extremities    Assessment:     Doing well post insertion aortic stent graft for enlarging aortic aneurysm done emergently January 2012 Rule out right middle lobe mass on CT a done today-needs a chest CT scan    Plan:     #1 we'll schedule a chest CT scan now to rule out right middle lobe mass-I discussed this with patient and ask her to get back in touch with [place]  if results of chest CT are not communicated with her Return in one year with CT angiogram to follow stent graft abdominal aortic aneurysm

## 2011-09-30 ENCOUNTER — Ambulatory Visit
Admission: RE | Admit: 2011-09-30 | Discharge: 2011-09-30 | Disposition: A | Discharge status: Home / Self Care | Payer: Medicare Other | Source: Ambulatory Visit | Attending: Vascular Surgery | Admitting: Vascular Surgery

## 2011-09-30 DIAGNOSIS — R918 Other nonspecific abnormal finding of lung field: Secondary | ICD-10-CM

## 2011-09-30 MED ORDER — IOHEXOL 300 MG/ML  SOLN
75.0000 mL | Freq: Once | INTRAMUSCULAR | Status: AC | PRN
  Administered 2011-09-30: 75 mL via INTRAVENOUS

## 2011-10-02 ENCOUNTER — Other Ambulatory Visit: Payer: Self-pay

## 2011-10-02 DIAGNOSIS — R918 Other nonspecific abnormal finding of lung field: Secondary | ICD-10-CM

## 2011-10-14 ENCOUNTER — Other Ambulatory Visit: Payer: Self-pay | Admitting: Thoracic Surgery (Cardiothoracic Vascular Surgery)

## 2011-10-14 ENCOUNTER — Encounter: Payer: Self-pay | Admitting: Thoracic Surgery (Cardiothoracic Vascular Surgery)

## 2011-10-14 ENCOUNTER — Institutional Professional Consult (permissible substitution) (INDEPENDENT_AMBULATORY_CARE_PROVIDER_SITE_OTHER): Payer: Medicare Other | Admitting: Thoracic Surgery (Cardiothoracic Vascular Surgery)

## 2011-10-14 VITALS — BP 139/82 | HR 66 | Resp 16 | Ht 64.0 in | Wt 250.0 lb

## 2011-10-14 DIAGNOSIS — R599 Enlarged lymph nodes, unspecified: Secondary | ICD-10-CM

## 2011-10-14 DIAGNOSIS — D381 Neoplasm of uncertain behavior of trachea, bronchus and lung: Secondary | ICD-10-CM

## 2011-10-14 DIAGNOSIS — R222 Localized swelling, mass and lump, trunk: Secondary | ICD-10-CM

## 2011-10-14 DIAGNOSIS — R918 Other nonspecific abnormal finding of lung field: Secondary | ICD-10-CM

## 2011-10-14 NOTE — Progress Notes (Signed)
PCP is [name], MD, MD Referring Provider is Pryor Ochoa, MD  Chief Complaint  Patient presents with  . Lung Mass    Referral from Dr Hart [place] for eval on Lung Mass, Chest CT 09/30/11    HPI: 78 yo female presents with cc/o lung mass  She has a ~ 30-40 pack year history of smoking. Recently had CT abdomen to follow up a stent graft. It showed question of a right lung mass. CT chest done 4/10- it showed a 2.3 x 2.5 cm opacity in RUL. Also a 1.2 cm right paratracheal node and bilateral adrenal masses. She denies CP, SOB. + cough, no hemoptysis, no wheezing.   Past Medical History  Diagnosis Date  . Hypertension   . Hyperlipidemia   . Cerebrovascular accident history of mild cerebrovascular accident which caused some visual disturbance.   . Diabetes mellitus   . Morbid obesity     Past Surgical History  Procedure Date  . Abdominal aortic aneurysm repair 06/28/10    Stent Graft repair    . Exploratory laparotomy     For ectopic pregnancy  . Lumbar disc surgery   . Aortogram  03/03/11    with stenting , left external iliac artery    Family History  Problem Relation Age of Onset  . Heart disease Mother   . Hypertension Mother   . Heart disease Father   . Hypertension Father     Social History History  Substance Use Topics  . Smoking status: Current Everyday Smoker -- 0.5 packs/day for 65 years    Types: Cigarettes  . Smokeless tobacco: Never Used  . Alcohol Use: No     She does not consume alcohol on a regular basis.    Current Outpatient Prescriptions  Medication Sig Dispense Refill  . amLODipine (NORVASC) 10 MG tablet Take 10 mg by mouth daily.        Marland Kitchen aspirin 325 MG tablet Take 325 mg by mouth daily.      Marland Kitchen atorvastatin (LIPITOR) 20 MG tablet Take 20 mg by mouth daily.        Marland Kitchen eprosartan-hydrochlorothiazide (TEVETEN HCT) 600-12.5 MG per tablet Take 1 tablet by mouth daily.        Marland Kitchen HYDROcodone-acetaminophen (LORCET) 10-650 MG per tablet       .  indomethacin (INDOCIN) 25 MG capsule       . JANUVIA 100 MG tablet daily.       . multivitamin (THERAGRAN) per tablet Take 1 tablet by mouth daily.        Marland Kitchen NEXIUM 40 MG capsule       . NIASPAN 500 MG CR tablet       . sitaGLIPtin (JANUVIA) 25 MG tablet Take 25 mg by mouth daily.        . TEVETEN HCT 600-25 MG per tablet       . zolpidem (AMBIEN CR) 12.5 MG CR tablet Take 12.5 mg by mouth at bedtime as needed.        Marland Kitchen allopurinol (ZYLOPRIM) 100 MG tablet Take 100 mg by mouth 2 (two) times daily.        Marland Kitchen LIDODERM 5 %         Allergies  Allergen Reactions  . Asa Buff (Mag (Buffered Aspirin)     In tolerance in higher dosages.    Review of Systems  Constitutional: Negative.  Negative for fever, chills and unexpected weight change.  HENT: Negative.   Respiratory: Positive for cough.  Negative for chest tightness, shortness of breath and wheezing.   Cardiovascular: Positive for leg swelling. Negative for chest pain.       Claudication  Genitourinary: Positive for frequency.  Musculoskeletal: Positive for back pain, joint swelling and arthralgias.  Neurological:       Ministroke 10 years ago no residual   Hematological: Negative.   Psychiatric/Behavioral: Negative.   All other systems reviewed and are negative.    BP 139/82  Pulse 66  Resp 16  Ht 5\' 4"  (1.626 m)  Wt 250 lb (113.399 kg)  BMI 42.91 kg/m2  SpO2 95% Physical Exam  Vitals reviewed. Constitutional: She is oriented to person, place, and time.       Morbidly obese   HENT:  Head: Normocephalic and atraumatic.  Eyes: EOM are normal. Pupils are equal, round, and reactive to light.  Neck: No tracheal deviation present. No thyromegaly present.  Cardiovascular: Normal rate, regular rhythm and normal heart sounds.  Exam reveals no gallop and no friction rub.   No murmur heard. Pulmonary/Chest: Effort normal and breath sounds normal. She has no wheezes. She has no rales.  Abdominal: Soft. There is no tenderness.    Musculoskeletal: She exhibits no edema.  Lymphadenopathy:    She has no cervical adenopathy.  Neurological: She is alert and oriented to person, place, and time. No cranial nerve deficit.  Skin: Skin is warm and dry.     Diagnostic Tests: CT CHEST WITH CONTRAST  Technique: Multidetector CT imaging of the chest was performed  following the standard protocol during bolus administration of  intravenous contrast.  Contrast: 75mL OMNIPAQUE IOHEXOL 300 MG/ML SOLN  Comparison: CT angio abdomen pelvis of 09/29/2011 and CT angio  chest of 01/25/2008  Findings: Just above the small nodular opacity noted on yesterday's  CT of the abdomen and pelvis at the right lung base, there is a  more prominent nodular lesion present. This lesion extends into  the anterior inferior right upper lobe and measures 2.7 x 2.3 x 3.0  cm. There is mild bronchiectatic change just proximal to this  opacity which may be chronic in nature. However the soft tissue  component more distally is definitely worrisome for malignancy and  bronchoscopy or PET-CT may be helpful to assess further. No  additional lung lesion is seen. No pleural effusion is noted.  On soft tissue window images, the thyroid gland is unremarkable.  Atheromatous change is noted throughout the aortic arch and entire  descending thoracic aorta. Coronary artery calcifications are  present throughout the distribution of the left anterior descending  and left circumflex arteries. There is a slightly prominent  precarinal node with short axis diameter of 12 mm. No other  adenopathy is seen. No abnormality of the liver is seen.  In view of the lung lesion, the somewhat prominent adrenal glands  bilaterally noted on yesterday's CT of the abdomen and pelvis could  conceivably represent metastatic involvement. There are  degenerative changes diffusely throughout the thoracic spine. No  lytic or blastic lesion is seen.  IMPRESSION:  1. Mass-like opacity  within the anterior inferior right upper lobe  worrisome for malignancy. Recommend bronchoscopy and/or PET CT to  assess further.  2. There is some bronchiectatic change proximal to this lesion  within the anterior inferior right upper lobe and some of the  changes may be chronic in nature.  3. Slightly prominent precarinal node with short axis diameter of  12 mm.  4. The prominent adrenal glands noted  on yesterday's CT of the  abdomen and pelvis could represent metastatic involvement in view  of this lung lesion.   Impression: 78 yo smoker with RUL mass, mildly enlarged paratracheal node and bilateral adrenal masses. Worrisome for lung cancer. Given multiple sites we will get PET/CT to assess extent of disease. Will need a biopsy, site to be determined by PET.  Plan: PET/CT Return in 1 week

## 2011-10-21 ENCOUNTER — Encounter (HOSPITAL_COMMUNITY)
Admission: RE | Admit: 2011-10-21 | Discharge: 2011-10-21 | Disposition: A | Discharge status: Home / Self Care | Payer: Medicare Other | Source: Ambulatory Visit | Attending: Thoracic Surgery (Cardiothoracic Vascular Surgery) | Admitting: Thoracic Surgery (Cardiothoracic Vascular Surgery)

## 2011-10-21 ENCOUNTER — Ambulatory Visit (INDEPENDENT_AMBULATORY_CARE_PROVIDER_SITE_OTHER): Payer: Medicare Other | Admitting: Thoracic Surgery (Cardiothoracic Vascular Surgery)

## 2011-10-21 ENCOUNTER — Encounter: Payer: Self-pay | Admitting: Thoracic Surgery (Cardiothoracic Vascular Surgery)

## 2011-10-21 VITALS — BP 135/67 | HR 66 | Resp 20 | Ht 64.0 in | Wt 250.0 lb

## 2011-10-21 DIAGNOSIS — E279 Disorder of adrenal gland, unspecified: Secondary | ICD-10-CM | POA: Insufficient documentation

## 2011-10-21 DIAGNOSIS — J189 Pneumonia, unspecified organism: Secondary | ICD-10-CM

## 2011-10-21 DIAGNOSIS — R599 Enlarged lymph nodes, unspecified: Secondary | ICD-10-CM

## 2011-10-21 DIAGNOSIS — D381 Neoplasm of uncertain behavior of trachea, bronchus and lung: Secondary | ICD-10-CM

## 2011-10-21 DIAGNOSIS — J984 Other disorders of lung: Secondary | ICD-10-CM | POA: Insufficient documentation

## 2011-10-21 LAB — GLUCOSE, CAPILLARY: Glucose-Capillary: 91 mg/dL (ref 70–99)

## 2011-10-21 MED ORDER — FLUDEOXYGLUCOSE F - 18 (FDG) INJECTION
17.5000 | Freq: Once | INTRAVENOUS | Status: AC | PRN
  Administered 2011-10-21: 17.5 via INTRAVENOUS

## 2011-10-21 MED ORDER — MOXIFLOXACIN HCL 400 MG PO TABS: 400.0000 mg | ORAL_TABLET | Freq: Every day | ORAL | Status: AC

## 2011-10-21 NOTE — Progress Notes (Signed)
HPI: Beverly Lewis returns today to discuss the results of her PET scan. She had been found to have a right lung nodule a few weeks ago on a CT that was done to assess an intra-abdominal stent graft. A CT of the chest was done which showed a 2.3 x 2.5 cm "mass" with some surrounding infiltrate and some bronchiectasis in the area. There also was a question of enlarged paratracheal node and enlarged adrenal glands. I recommended a PET/CT to see these lesions were hypermetabolic and to help guide biopsy if necessary.  Past Medical History  Diagnosis Date  . Hypertension   . Hyperlipidemia   . Cerebrovascular accident history of mild cerebrovascular accident which caused some visual disturbance.   . Diabetes mellitus   . Morbid obesity      Current Outpatient Prescriptions  Medication Sig Dispense Refill  . allopurinol (ZYLOPRIM) 100 MG tablet Take 100 mg by mouth 2 (two) times daily.        Marland Kitchen amLODipine (NORVASC) 10 MG tablet Take 10 mg by mouth daily.        Marland Kitchen aspirin 325 MG tablet Take 325 mg by mouth daily.      Marland Kitchen atorvastatin (LIPITOR) 20 MG tablet Take 20 mg by mouth daily.        Marland Kitchen eprosartan-hydrochlorothiazide (TEVETEN HCT) 600-12.5 MG per tablet Take 1 tablet by mouth daily.       Marland Kitchen HYDROcodone-acetaminophen (LORCET) 10-650 MG per tablet 1 tablet every 6 (six) hours as needed.       Marland Kitchen JANUVIA 100 MG tablet daily.       . multivitamin (THERAGRAN) per tablet Take 1 tablet by mouth daily.        Marland Kitchen NEXIUM 40 MG capsule Take 40 mg by mouth daily before breakfast.       . NIASPAN 500 MG CR tablet Take 500 mg by mouth at bedtime.       . moxifloxacin (AVELOX) 400 MG tablet Take 1 tablet (400 mg total) by mouth daily.  14 tablet  0  . DISCONTD: sitaGLIPtin (JANUVIA) 25 MG tablet Take 25 mg by mouth daily.        Marland Kitchen DISCONTD: TEVETEN HCT 600-25 MG per tablet        No current facility-administered medications for this visit.   Facility-Administered Medications Ordered in Other Visits    Medication Dose Route Frequency Provider Last Rate Last Dose  . fludeoxyglucose F - 18 (FDG) injection 17.5 milli Curie  17.5 milli Curie Intravenous Once PRN Medication Radiologist, MD   17.5 milli Curie at 10/21/11 0845     Physical Exam: BP 135/67  Pulse 66  Resp 20  Ht 5\' 4"  (1.626 m)  Wt 250 lb (113.399 kg)  BMI 42.91 kg/m2  SpO45 66% 78 year old woman in a wheelchair Exam unchanged  Diagnostic Tests: PET/CT reviewed IMPRESSION:  1. Mild to moderately degraded exam secondary patient body  habitus.  2. The right upper lobe pulmonary opacity is not felt to be  hypermetabolic; SUV measurement felt to be artificially inflated by  underlying degradation. Further, this "lesion" may have enlarged  slightly since 09/30/2011 ( suggesting an infectious etiology).  Correlate with infectious symptoms, which could have this  appearance. Consider follow-up with chest CT of 3 months.  2. The adrenal nodules are low density, consistent with adenomas.  3. The precarinal node is not significantly hypermetabolic.   Impression: Findings a PET/CT showed none of the areas of concern were hypermetabolic. In particular the  lung "mass" which has grown since her CT scan showed no evidence of FDG uptake. This is highly unlikely represent a malignancy. The findings therefore likely represent a serendipitously discovered pneumonia with incidental bilateral adrenal adenomas.  I discussed the findings with Beverly Lewis. I recommended to her that we treat her with antibiotics.  She will still need to have this followed until it resolves radiographically.  Plan: Avelox 400 mg by mouth daily for 14 days.  I will see her back in 2 weeks with a chest x-ray. I do not expect x-ray to show much in the way of improvement at that time but will provide baseline for further followup.

## 2011-11-17 ENCOUNTER — Encounter: Payer: Medicare Other | Admitting: Thoracic Surgery (Cardiothoracic Vascular Surgery)

## 2011-11-18 ENCOUNTER — Encounter: Payer: Self-pay | Admitting: Thoracic Surgery (Cardiothoracic Vascular Surgery)

## 2011-11-18 ENCOUNTER — Other Ambulatory Visit: Payer: Self-pay | Admitting: Thoracic Surgery (Cardiothoracic Vascular Surgery)

## 2011-11-18 ENCOUNTER — Ambulatory Visit (INDEPENDENT_AMBULATORY_CARE_PROVIDER_SITE_OTHER): Payer: Medicare Other | Admitting: Thoracic Surgery (Cardiothoracic Vascular Surgery)

## 2011-11-18 ENCOUNTER — Ambulatory Visit
Admission: RE | Admit: 2011-11-18 | Discharge: 2011-11-18 | Disposition: A | Discharge status: Home / Self Care | Payer: Medicare Other | Source: Ambulatory Visit | Attending: Thoracic Surgery (Cardiothoracic Vascular Surgery) | Admitting: Thoracic Surgery (Cardiothoracic Vascular Surgery)

## 2011-11-18 VITALS — BP 115/64 | HR 95 | Temp 98.8°F | Resp 16 | Ht 64.0 in | Wt 250.0 lb

## 2011-11-18 DIAGNOSIS — D381 Neoplasm of uncertain behavior of trachea, bronchus and lung: Secondary | ICD-10-CM

## 2011-11-18 DIAGNOSIS — R918 Other nonspecific abnormal finding of lung field: Secondary | ICD-10-CM

## 2011-11-18 NOTE — Progress Notes (Signed)
HPI  78 year old woman returns for a scheduled 2 week followup visit. She had a CT scan in April which showed a possible right middle lobe mass, paratracheal adenopathy, and bilateral adrenal masses. A PET CT was done which showed these areas were hypermetabolic. Also of note right lung "mass" had increased significantly in size in a 2 week period making this more consistent with infectious or inflammatory type etiology than with neoplasm.  I gave her a prescription for Avelox for 2 weeks, which is now completed. Says that in the interim since her last visit continues to have "hot flashes" in sometimes feels chilled, but she's not noted any fevers. She's not had any night sweats. She does still have a cough. It is productive, although the sputum is now whitish and not as thick as it was prior to her antibiotics.   Current Outpatient Prescriptions  Medication Sig Dispense Refill  . allopurinol (ZYLOPRIM) 100 MG tablet Take 100 mg by mouth 2 (two) times daily.        Marland Kitchen amLODipine (NORVASC) 10 MG tablet Take 10 mg by mouth daily.        Marland Kitchen aspirin 325 MG tablet Take 325 mg by mouth daily.      Marland Kitchen atorvastatin (LIPITOR) 20 MG tablet Take 20 mg by mouth daily.        Marland Kitchen eprosartan-hydrochlorothiazide (TEVETEN HCT) 600-12.5 MG per tablet Take 1 tablet by mouth daily.       Marland Kitchen HYDROcodone-acetaminophen (LORCET) 10-650 MG per tablet 1 tablet every 6 (six) hours as needed.       Marland Kitchen JANUVIA 100 MG tablet daily.       . multivitamin (THERAGRAN) per tablet Take 1 tablet by mouth daily.        Marland Kitchen NEXIUM 40 MG capsule Take 40 mg by mouth daily before breakfast.       . NIASPAN 500 MG CR tablet Take 500 mg by mouth at bedtime.       Marland Kitchen COLCRYS 0.6 MG tablet          Review of Systems: Shortness of breath with exertion-she is an example of walking to her kitchen and back. Productive cough. She also complains of feeling tired in the afternoons  Physical Exam  Vitals reviewed. Constitutional:       Morbidly  obese  HENT:  Head: Normocephalic and atraumatic.  Neck: Neck supple.  Cardiovascular: Normal rate and regular rhythm.   Pulmonary/Chest: Effort normal and breath sounds normal. No respiratory distress. She has no wheezes.  Lymphadenopathy:    She has no cervical adenopathy.     Diagnostic Tests: CXR 11/18/11  Findings: The anterior right upper lobe lesion is not well seen on  the frontal view, with perhaps mild residual opacity noted  anteriorly on the lateral view. This opacity may represent an  infectious or inflammatory process and continued follow-up is  recommended. Mild basilar linear atelectasis and/or scarring  remains. Cardiomegaly is stable. There are degenerative changes  throughout the thoracic spine. An aortobi-iliac stent graft is  noted.  IMPRESSION:  Apparent decrease in size of the anterior right upper lobe opacity  suggesting inflammatory or infectious etiology. Recommend  continued follow-up.   Impression: 78 year old woman with a right lung abnormality, most likely infectious in etiology although inflammatory or neoplastic etiologies cannot be completely ruled out. I don't think there is any role for additional antibiotics at this point in time. I recommended her that we see her back in about 2 months, which  will be 3 months from her original CT scan, with a repeat CT.  Plan: Return to office in 2 months with CT of the chest to followup right lung infiltrate.

## 2011-12-14 ENCOUNTER — Other Ambulatory Visit: Payer: Self-pay | Admitting: Thoracic Surgery (Cardiothoracic Vascular Surgery)

## 2011-12-14 DIAGNOSIS — D381 Neoplasm of uncertain behavior of trachea, bronchus and lung: Secondary | ICD-10-CM

## 2012-01-12 ENCOUNTER — Encounter: Payer: Self-pay | Admitting: Thoracic Surgery (Cardiothoracic Vascular Surgery)

## 2012-01-12 ENCOUNTER — Ambulatory Visit (INDEPENDENT_AMBULATORY_CARE_PROVIDER_SITE_OTHER): Payer: Medicare Other | Admitting: Thoracic Surgery (Cardiothoracic Vascular Surgery)

## 2012-01-12 ENCOUNTER — Ambulatory Visit
Admission: RE | Admit: 2012-01-12 | Discharge: 2012-01-12 | Disposition: A | Discharge status: Home / Self Care | Payer: Medicare Other | Source: Ambulatory Visit | Attending: Thoracic Surgery (Cardiothoracic Vascular Surgery) | Admitting: Thoracic Surgery (Cardiothoracic Vascular Surgery)

## 2012-01-12 ENCOUNTER — Other Ambulatory Visit: Payer: Self-pay | Admitting: *Deleted

## 2012-01-12 VITALS — BP 121/76 | HR 75 | Temp 98.6°F | Resp 18 | Ht 64.0 in | Wt 252.0 lb

## 2012-01-12 DIAGNOSIS — R918 Other nonspecific abnormal finding of lung field: Secondary | ICD-10-CM

## 2012-01-12 DIAGNOSIS — D381 Neoplasm of uncertain behavior of trachea, bronchus and lung: Secondary | ICD-10-CM

## 2012-01-12 NOTE — Progress Notes (Signed)
PCP is [name], MD Referring Provider is [name], MD  Chief Complaint  Patient presents with  . Follow-up    2 MONTH FU CHEST CT    HPI: Beverly Lewis is an 78 year old woman who returns for followup of a right upper lobe nodule.  She had a CT in April of this year for followup of an aortic stent graft which showed a questionable nodule in the right upper lobe on the upper cuts. A CT of the chest was done to further evaluate and it showed 2.5 x 2.3 cm mass there also was an enlarged paratracheal node, and bilateral adrenal nodules. We were concerned this could be stage IV lung cancer and a PET CT was done. No other lesions were hypermetabolic, in addition the right upper lobe "mass" had enlarged which was suspicious for an infectious etiology. She was treated with a two-week course of Avelox and a followup chest x-ray showed suggestion that the nodule was smaller. She now returns for a 3 month followup scan to reassess the lesion.  In the interim since her last visit she's continued to have a frequent cough with clear sputum. She has not had any hemoptysis. She says she has had occasional chills, but no fevers or sweats. Overall she feels about the same as usual with no new complaints.   Past Medical History  Diagnosis Date  . Hypertension   . Hyperlipidemia   . Cerebrovascular accident history of mild cerebrovascular accident which caused some visual disturbance.   . Diabetes mellitus   . Morbid obesity     Past Surgical History  Procedure Date  . Abdominal aortic aneurysm repair 06/28/10    Stent Graft repair    . Exploratory laparotomy     For ectopic pregnancy  . Lumbar disc surgery   . Aortogram  03/03/11    with stenting , left external iliac artery    Family History  Problem Relation Age of Onset  . Heart disease Mother   . Hypertension Mother   . Heart disease Father   . Hypertension Father     Social History History  Substance Use Topics  . Smoking  status: Current Everyday Smoker -- 0.5 packs/day for 65 years    Types: Cigarettes  . Smokeless tobacco: Never Used  . Alcohol Use: No     She does not consume alcohol on a regular basis.    Current Outpatient Prescriptions  Medication Sig Dispense Refill  . allopurinol (ZYLOPRIM) 100 MG tablet Take 100 mg by mouth 2 (two) times daily.        Marland Kitchen amLODipine (NORVASC) 10 MG tablet Take 10 mg by mouth daily.        Marland Kitchen aspirin 325 MG tablet Take 325 mg by mouth daily.      Marland Kitchen atorvastatin (LIPITOR) 20 MG tablet Take 20 mg by mouth daily.        Marland Kitchen COLCRYS 0.6 MG tablet       . eprosartan-hydrochlorothiazide (TEVETEN HCT) 600-12.5 MG per tablet Take 1 tablet by mouth daily.       Marland Kitchen HYDROcodone-acetaminophen (LORCET) 10-650 MG per tablet 1 tablet every 6 (six) hours as needed.       Marland Kitchen JANUVIA 100 MG tablet daily.       . multivitamin (THERAGRAN) per tablet Take 1 tablet by mouth daily.        Marland Kitchen NEXIUM 40 MG capsule Take 40 mg by mouth daily before breakfast.       .  NIASPAN 500 MG CR tablet Take 500 mg by mouth at bedtime.         Allergies  Allergen Reactions  . Asa Buff (Mag (Buffered Aspirin)     In tolerance in higher dosages.    Review of Systems  Constitutional: Positive for activity change (very limited activity).  Respiratory: Positive for cough (productive, clear). Negative for shortness of breath.   Cardiovascular: Negative for chest pain.  Genitourinary: Positive for frequency.  Musculoskeletal: Positive for joint swelling, arthralgias and gait problem.  Neurological:       Ministroke 10 years ago  All other systems reviewed and are negative.    BP 121/76  Pulse 75  Temp 98.6 F (37 C)  Resp 18  Ht 5\' 4"  (1.626 m)  Wt 252 lb (114.306 kg)  BMI 43.26 kg/m2  SpO2 94% Physical Exam  Vitals reviewed. Constitutional: She is oriented to person, place, and time.       Morbidly obese  HENT:  Head: Normocephalic and atraumatic.  Eyes: EOM are normal. Pupils are equal,  round, and reactive to light.  Neck: Neck supple. No thyromegaly present.  Cardiovascular: Normal rate, regular rhythm and normal heart sounds.   No murmur heard. Pulmonary/Chest: She has no wheezes.       Diminished BS bilaterally  Abdominal: Soft. There is no tenderness.  Musculoskeletal: She exhibits edema (1+).  Lymphadenopathy:    She has no cervical adenopathy.  Neurological: She is alert and oriented to person, place, and time. No cranial nerve deficit.  Skin: Skin is warm and dry.     Diagnostic Tests: CT Chest 01/12/12 IMPRESSION:  1. Interval enlargement of what is now a 3.7 x 2.5 cm  macrolobulated mass with spiculated margins and surrounding  architectural distortion in the right upper lobe. This lesion has  a highly aggressive appearance, however, based on the lack of  activity on prior PET CT 10/21/2011, this could conceivably be a  benign process. Continued attention on follow-up imaging or  correlation with biopsy may be warranted.  2. Atherosclerosis, including left main three-vessel coronary  artery disease. Please note that although the presence of coronary  artery calcium documents the presence of coronary artery disease,  the severity of this disease and any potential stenosis cannot be  assessed on this non-gated CT examination. Assessment for  potential risk factor modification, dietary therapy or  pharmacologic therapy may be warranted, if clinically indicated.  3. Appearance of the lung bases suggests areas of air trapping  related to small airways disease, as above.  4. Bilateral adrenal adenoma is again noted.  Impression: 78 year old woman with an enlarging right upper lobe nodule. This is increased in size from 2.5 x 2.3 cm to 2.5 x 3.7 cm in 3 months. This lesion was not hypermetabolic on PET CT. Given his combination of increasing size in the absence of hypermetabolism an infectious etiology is still the most likely. However, cancer cannot be  definitively ruled out.  I recommended to her that we proceed with electromagnetic navigational bronchoscopy to obtain biopsies and cultures to better manage this issue. I discussed with her the general nature of the procedure, including the need for general anesthesia. I discussed with her the indications, risks, benefits, and alternatives. She understands the risks include those associated with general anesthesia as well as the risk of bleeding and pneumothorax associated with the biopsies. I do think given the anterior location of this that electromagnetic navigational will be essential to ensure adequate sampling.  This can usually be done on an outpatient basis, but occasionally requires overnight observation. She understands this is a diagnostic and therapeutic procedure.  Plan: ENB Monday, July 29.

## 2012-01-13 ENCOUNTER — Encounter (HOSPITAL_COMMUNITY): Payer: Self-pay | Admitting: Pharmacy Technician

## 2012-01-20 ENCOUNTER — Encounter (HOSPITAL_COMMUNITY)
Admission: RE | Admit: 2012-01-20 | Discharge: 2012-01-20 | Disposition: A | Discharge status: Home / Self Care | Payer: Medicare Other | Source: Ambulatory Visit | Attending: Thoracic Surgery (Cardiothoracic Vascular Surgery) | Admitting: Thoracic Surgery (Cardiothoracic Vascular Surgery)

## 2012-01-20 ENCOUNTER — Encounter (HOSPITAL_COMMUNITY): Payer: Self-pay

## 2012-01-20 ENCOUNTER — Ambulatory Visit (HOSPITAL_COMMUNITY)
Admission: RE | Admit: 2012-01-20 | Discharge: 2012-01-20 | Disposition: A | Discharge status: Home / Self Care | Payer: Medicare Other | Source: Ambulatory Visit | Attending: Thoracic Surgery (Cardiothoracic Vascular Surgery) | Admitting: Thoracic Surgery (Cardiothoracic Vascular Surgery)

## 2012-01-20 VITALS — BP 138/62 | HR 81 | Temp 98.4°F | Resp 20 | Ht 62.0 in | Wt 248.7 lb

## 2012-01-20 DIAGNOSIS — R222 Localized swelling, mass and lump, trunk: Secondary | ICD-10-CM | POA: Insufficient documentation

## 2012-01-20 DIAGNOSIS — Z01818 Encounter for other preprocedural examination: Secondary | ICD-10-CM | POA: Insufficient documentation

## 2012-01-20 DIAGNOSIS — R918 Other nonspecific abnormal finding of lung field: Secondary | ICD-10-CM

## 2012-01-20 DIAGNOSIS — Z01812 Encounter for preprocedural laboratory examination: Secondary | ICD-10-CM | POA: Insufficient documentation

## 2012-01-20 HISTORY — DX: Other nonspecific abnormal finding of lung field: R91.8

## 2012-01-20 HISTORY — DX: Peripheral vascular disease, unspecified: I73.9

## 2012-01-20 HISTORY — DX: Pneumonia, unspecified organism: J18.9

## 2012-01-20 HISTORY — DX: Unspecified osteoarthritis, unspecified site: M19.90

## 2012-01-20 HISTORY — DX: Gastro-esophageal reflux disease without esophagitis: K21.9

## 2012-01-20 HISTORY — DX: Shortness of breath: R06.02

## 2012-01-20 LAB — COMPREHENSIVE METABOLIC PANEL
AST: 15 U/L (ref 0–37)
BUN: 22 mg/dL (ref 6–23)
CO2: 24 mEq/L (ref 19–32)
Calcium: 9.4 mg/dL (ref 8.4–10.5)
Creatinine, Ser: 1.08 mg/dL (ref 0.50–1.10)
GFR calc non Af Amer: 47 mL/min — ABNORMAL LOW (ref >90)
Total Bilirubin: 0.2 mg/dL — ABNORMAL LOW (ref 0.3–1.2)

## 2012-01-20 LAB — CBC
HCT: 41.8 % (ref 36.0–46.0)
MCH: 30.4 pg (ref 26.0–34.0)
MCV: 93.3 fL (ref 78.0–100.0)
Platelets: 211 10*3/uL (ref 150–400)
RBC: 4.48 MIL/uL (ref 3.87–5.11)
RDW: 14.7 % (ref 11.5–15.5)

## 2012-01-20 LAB — PROTIME-INR
INR: 1.2 (ref 0.00–1.49)
Prothrombin Time: 15.5 seconds — ABNORMAL HIGH (ref 11.6–15.2)

## 2012-01-20 NOTE — Progress Notes (Signed)
req'd by tarra notes any test, ekg from pmp dr Derald Macleod alpha med

## 2012-01-20 NOTE — Consult Note (Signed)
Anesthesia Chart Review:  Patient is a 78 year old female scheduled for electromagnetic navigation bronchoscopy on 01/21/12 by Dr. Dorris Fetch.  She has a RUL mass felt to be of an  infectious etiology but malignancy cannot be ruled out.   History includes hypertension, hyperlipidemia, smoking, CVA, diabetes mellitus type II, morbid obesity with BMI of 45, left EIA stent 02/2011, emergent  aorto-bi-common iliac Gore stent graft in 06/2010 for symptomatic 5.8 cm infrarenal AAA (Dr. Hart Rochester).  PCP is listed as Renaye Rakers.  She told her PAT nurse that she recently saw Dr. Fleet Contras.  Labs noted.  CXR on 01/20/12 showed: Again noted mass-like consolidation in the right upper lobe  anteromedially. No acute infiltrate or pulmonary edema.  EKG on 01/20/12 showed NSR, minimal voltage criteria for LVH, anterior infarct (age undetermined).  (R wave progression in V1-3 is similar to her previous EKG on 03/03/11 and 06/28/10, but is worse in V4-5).  This could be due to lead placement.  No chest pain symptoms were documented at her PAT or surgeon visits.  Anticipate she can proceed if no acute CV symptoms.  Anesthesiologist Dr. Michelle Piper agrees with this plan.  Shonna Chock, PA-C

## 2012-01-20 NOTE — Pre-Procedure Instructions (Addendum)
20 KYLIYAH STIRN  01/20/2012   Your procedure is scheduled on:  01/21/12  Report to [facility] at 530 AM.  Call this number if you have problems the morning of surgery: [phone]   Remember:   Do not eat food or drink:After Midnight. .  Take these medicines the morning of surgery with A SIP OF WATER: allopurinol, amlodipine,flexeril, lorcet, nexium   Do not wear jewelry, make-up or nail polish.  Do not wear lotions, powders, or perfumes. You may wear deodorant.  Do not shave 48 hours prior to surgery. Men may shave face and neck.  Do not bring valuables to the hospital.  Contacts, dentures or bridgework may not be worn into surgery.  Leave suitcase in the car. After surgery it may be brought to your room.  For patients admitted to the hospital, checkout time is 11:00 AM the day of discharge.   Patients discharged the day of surgery will not be allowed to drive home.  Name and phone number of your driver: Onalee Hua [phone] friend  Special Instructions: CHG Shower Use Special Wash: 1/2 bottle night before surgery and 1/2 bottle morning of surgery.   Please read over the following fact sheets that you were given: Pain Booklet, Coughing and Deep Breathing, MRSA Information and Surgical Site Infection Prevention

## 2012-01-21 ENCOUNTER — Encounter (HOSPITAL_COMMUNITY)
Admission: RE | Disposition: A | Discharge status: Home / Self Care | Payer: Self-pay | Source: Ambulatory Visit | Attending: Thoracic Surgery (Cardiothoracic Vascular Surgery)

## 2012-01-21 ENCOUNTER — Ambulatory Visit (HOSPITAL_COMMUNITY): Payer: Medicare Other

## 2012-01-21 ENCOUNTER — Ambulatory Visit (HOSPITAL_COMMUNITY): Payer: Medicare Other | Admitting: Vascular Surgery

## 2012-01-21 ENCOUNTER — Encounter (HOSPITAL_COMMUNITY): Payer: Self-pay | Admitting: Vascular Surgery

## 2012-01-21 ENCOUNTER — Inpatient Hospital Stay (HOSPITAL_COMMUNITY)
Admission: RE | Admit: 2012-01-21 | Discharge: 2012-01-24 | DRG: 204 | Disposition: A | Discharge status: Home / Self Care | Payer: Medicare Other | Source: Ambulatory Visit | Attending: Thoracic Surgery (Cardiothoracic Vascular Surgery) | Admitting: Thoracic Surgery (Cardiothoracic Vascular Surgery)

## 2012-01-21 ENCOUNTER — Encounter (HOSPITAL_COMMUNITY): Payer: Self-pay | Admitting: *Deleted

## 2012-01-21 DIAGNOSIS — J95811 Postprocedural pneumothorax: Secondary | ICD-10-CM

## 2012-01-21 DIAGNOSIS — Y849 Medical procedure, unspecified as the cause of abnormal reaction of the patient, or of later complication, without mention of misadventure at the time of the procedure: Secondary | ICD-10-CM | POA: Diagnosis present

## 2012-01-21 DIAGNOSIS — D381 Neoplasm of uncertain behavior of trachea, bronchus and lung: Secondary | ICD-10-CM

## 2012-01-21 DIAGNOSIS — C3490 Malignant neoplasm of unspecified part of unspecified bronchus or lung: Secondary | ICD-10-CM

## 2012-01-21 DIAGNOSIS — R000 Tachycardia, unspecified: Secondary | ICD-10-CM | POA: Diagnosis not present

## 2012-01-21 DIAGNOSIS — R222 Localized swelling, mass and lump, trunk: Principal | ICD-10-CM | POA: Diagnosis present

## 2012-01-21 DIAGNOSIS — R918 Other nonspecific abnormal finding of lung field: Secondary | ICD-10-CM

## 2012-01-21 DIAGNOSIS — Y9210 Unspecified residential institution as the place of occurrence of the external cause: Secondary | ICD-10-CM | POA: Diagnosis present

## 2012-01-21 HISTORY — DX: Malignant neoplasm of unspecified part of unspecified bronchus or lung: C34.90

## 2012-01-21 LAB — GLUCOSE, CAPILLARY
Glucose-Capillary: 96 mg/dL (ref 70–99)
Glucose-Capillary: 111 mg/dL — ABNORMAL HIGH (ref 70–99)

## 2012-01-21 SURGERY — VIDEO BRONCHOSCOPY WITH ENDOBRONCHIAL NAVIGATION
Anesthesia: General | Wound class: Clean Contaminated

## 2012-01-21 MED ORDER — ROCURONIUM BROMIDE 100 MG/10ML IV SOLN
INTRAVENOUS | Status: DC | PRN
  Administered 2012-01-21: 5 mg via INTRAVENOUS
  Administered 2012-01-21: 50 mg via INTRAVENOUS

## 2012-01-21 MED ORDER — OXYCODONE HCL 5 MG PO TABS
5.0000 mg | ORAL_TABLET | ORAL | Status: DC | PRN
  Administered 2012-01-21 (×2): 10 mg via ORAL
  Administered 2012-01-21: 5 mg via ORAL
  Administered 2012-01-22 – 2012-01-24 (×7): 10 mg via ORAL
  Administered 2012-01-24: 5 mg via ORAL
  Filled 2012-01-21: qty 1
  Filled 2012-01-21 (×10): qty 2

## 2012-01-21 MED ORDER — LIDOCAINE 5 % EX PTCH
1.0000 | MEDICATED_PATCH | CUTANEOUS | Status: DC
  Administered 2012-01-22 – 2012-01-23 (×2): 1 via TRANSDERMAL
  Filled 2012-01-21 (×4): qty 1

## 2012-01-21 MED ORDER — SODIUM CHLORIDE 0.9 % IJ SOLN
3.0000 mL | Freq: Two times a day (BID) | INTRAMUSCULAR | Status: DC
  Administered 2012-01-21 – 2012-01-24 (×5): 3 mL via INTRAVENOUS

## 2012-01-21 MED ORDER — HYDROCHLOROTHIAZIDE 12.5 MG PO CAPS
12.5000 mg | ORAL_CAPSULE | Freq: Every day | ORAL | Status: DC
  Administered 2012-01-22 – 2012-01-24 (×3): 12.5 mg via ORAL
  Filled 2012-01-21 (×3): qty 1

## 2012-01-21 MED ORDER — GLYCOPYRROLATE 0.2 MG/ML IJ SOLN
INTRAMUSCULAR | Status: DC | PRN
  Administered 2012-01-21: .7 mg via INTRAVENOUS

## 2012-01-21 MED ORDER — EPINEPHRINE HCL 1 MG/ML IJ SOLN
INTRAMUSCULAR | Status: DC | PRN
  Administered 2012-01-21: 1 mg

## 2012-01-21 MED ORDER — LACTATED RINGERS IV SOLN
INTRAVENOUS | Status: DC | PRN
  Administered 2012-01-21: 07:00:00 via INTRAVENOUS

## 2012-01-21 MED ORDER — PROPOFOL 10 MG/ML IV EMUL
INTRAVENOUS | Status: DC | PRN
  Administered 2012-01-21: 150 mg via INTRAVENOUS

## 2012-01-21 MED ORDER — AMLODIPINE BESYLATE 10 MG PO TABS
10.0000 mg | ORAL_TABLET | Freq: Every day | ORAL | Status: DC
  Administered 2012-01-22 – 2012-01-24 (×3): 10 mg via ORAL
  Filled 2012-01-21 (×3): qty 1

## 2012-01-21 MED ORDER — 0.9 % SODIUM CHLORIDE (POUR BTL) OPTIME
TOPICAL | Status: DC | PRN
  Administered 2012-01-21: 1000 mL

## 2012-01-21 MED ORDER — PANTOPRAZOLE SODIUM 40 MG PO TBEC
40.0000 mg | DELAYED_RELEASE_TABLET | Freq: Every day | ORAL | Status: DC
  Administered 2012-01-22 – 2012-01-24 (×3): 40 mg via ORAL
  Filled 2012-01-21 (×3): qty 1

## 2012-01-21 MED ORDER — ALBUTEROL SULFATE (5 MG/ML) 0.5% IN NEBU
INHALATION_SOLUTION | RESPIRATORY_TRACT | Status: AC
  Filled 2012-01-21: qty 0.5

## 2012-01-21 MED ORDER — ATORVASTATIN CALCIUM 20 MG PO TABS
20.0000 mg | ORAL_TABLET | Freq: Every day | ORAL | Status: DC
  Administered 2012-01-22 – 2012-01-23 (×2): 20 mg via ORAL
  Filled 2012-01-21 (×3): qty 1

## 2012-01-21 MED ORDER — NEOSTIGMINE METHYLSULFATE 1 MG/ML IJ SOLN
INTRAMUSCULAR | Status: DC | PRN
  Administered 2012-01-21: 4 mg via INTRAVENOUS

## 2012-01-21 MED ORDER — FENTANYL CITRATE 0.05 MG/ML IJ SOLN
INTRAMUSCULAR | Status: DC | PRN
  Administered 2012-01-21 (×3): 50 ug via INTRAVENOUS

## 2012-01-21 MED ORDER — ALBUTEROL SULFATE (5 MG/ML) 0.5% IN NEBU
2.5000 mg | INHALATION_SOLUTION | Freq: Once | RESPIRATORY_TRACT | Status: AC
  Administered 2012-01-21: 2.5 mg via RESPIRATORY_TRACT

## 2012-01-21 MED ORDER — ALLOPURINOL 100 MG PO TABS
100.0000 mg | ORAL_TABLET | Freq: Every day | ORAL | Status: DC
  Administered 2012-01-22 – 2012-01-24 (×3): 100 mg via ORAL
  Filled 2012-01-21 (×3): qty 1

## 2012-01-21 MED ORDER — HYDROMORPHONE HCL PF 1 MG/ML IJ SOLN
INTRAMUSCULAR | Status: AC
  Filled 2012-01-21: qty 1

## 2012-01-21 MED ORDER — SODIUM CHLORIDE 0.9 % IV SOLN: 250.0000 mL | INTRAVENOUS | Status: DC | PRN

## 2012-01-21 MED ORDER — EPROSARTAN MESYLATE-HCTZ 600-12.5 MG PO TABS: 1.0000 | ORAL_TABLET | Freq: Every day | ORAL | Status: DC

## 2012-01-21 MED ORDER — ONDANSETRON HCL 4 MG/2ML IJ SOLN
INTRAMUSCULAR | Status: DC | PRN
  Administered 2012-01-21: 4 mg via INTRAVENOUS

## 2012-01-21 MED ORDER — HYDROCODONE-ACETAMINOPHEN 10-650 MG PO TABS
1.0000 | ORAL_TABLET | ORAL | Status: DC | PRN
  Administered 2012-01-21: 1 via ORAL

## 2012-01-21 MED ORDER — SODIUM CHLORIDE 0.9 % IJ SOLN: 3.0000 mL | INTRAMUSCULAR | Status: DC | PRN

## 2012-01-21 MED ORDER — ACETAMINOPHEN 325 MG PO TABS: 650.0000 mg | ORAL_TABLET | ORAL | Status: DC | PRN

## 2012-01-21 MED ORDER — HYDROCODONE-ACETAMINOPHEN 5-325 MG PO TABS
ORAL_TABLET | ORAL | Status: AC
  Filled 2012-01-21: qty 2

## 2012-01-21 MED ORDER — ASPIRIN EC 81 MG PO TBEC
81.0000 mg | DELAYED_RELEASE_TABLET | Freq: Every day | ORAL | Status: DC
  Administered 2012-01-22 – 2012-01-24 (×3): 81 mg via ORAL
  Filled 2012-01-21 (×3): qty 1

## 2012-01-21 MED ORDER — LINAGLIPTIN 5 MG PO TABS
5.0000 mg | ORAL_TABLET | Freq: Every day | ORAL | Status: DC
  Administered 2012-01-21 – 2012-01-24 (×4): 5 mg via ORAL
  Filled 2012-01-21 (×4): qty 1

## 2012-01-21 MED ORDER — COLCHICINE 0.6 MG PO TABS
0.6000 mg | ORAL_TABLET | Freq: Two times a day (BID) | ORAL | Status: DC | PRN
  Administered 2012-01-23: 0.6 mg via ORAL
  Filled 2012-01-21: qty 1

## 2012-01-21 MED ORDER — ACETAMINOPHEN 650 MG RE SUPP: 650.0000 mg | RECTAL | Status: DC | PRN

## 2012-01-21 MED ORDER — INSULIN ASPART 100 UNIT/ML ~~LOC~~ SOLN
0.0000 [IU] | Freq: Three times a day (TID) | SUBCUTANEOUS | Status: DC
  Administered 2012-01-21: 3 [IU] via SUBCUTANEOUS
  Administered 2012-01-22 (×2): 2 [IU] via SUBCUTANEOUS
  Administered 2012-01-23: 3 [IU] via SUBCUTANEOUS
  Administered 2012-01-23: 2 [IU] via SUBCUTANEOUS

## 2012-01-21 MED ORDER — CYCLOBENZAPRINE HCL 10 MG PO TABS
10.0000 mg | ORAL_TABLET | Freq: Every evening | ORAL | Status: DC | PRN
  Administered 2012-01-21: 10 mg via ORAL
  Filled 2012-01-21 (×2): qty 1

## 2012-01-21 MED ORDER — ONDANSETRON HCL 4 MG/2ML IJ SOLN: 4.0000 mg | Freq: Four times a day (QID) | INTRAMUSCULAR | Status: DC | PRN

## 2012-01-21 MED ORDER — IRBESARTAN 150 MG PO TABS
150.0000 mg | ORAL_TABLET | Freq: Every day | ORAL | Status: DC
  Administered 2012-01-22 – 2012-01-24 (×3): 150 mg via ORAL
  Filled 2012-01-21 (×3): qty 1

## 2012-01-21 MED ORDER — OXYCODONE HCL 5 MG PO TABS
ORAL_TABLET | ORAL | Status: AC
  Filled 2012-01-21: qty 2

## 2012-01-21 MED ORDER — ACETAMINOPHEN 10 MG/ML IV SOLN
INTRAVENOUS | Status: AC
  Filled 2012-01-21: qty 100

## 2012-01-21 MED ORDER — HYDROMORPHONE HCL PF 1 MG/ML IJ SOLN
0.2500 mg | INTRAMUSCULAR | Status: DC | PRN
  Administered 2012-01-21 (×4): 0.5 mg via INTRAVENOUS

## 2012-01-21 MED ORDER — HYDROCODONE-ACETAMINOPHEN 10-325 MG PO TABS
1.0000 | ORAL_TABLET | ORAL | Status: DC | PRN
  Administered 2012-01-21 – 2012-01-24 (×7): 1 via ORAL
  Filled 2012-01-21 (×7): qty 1

## 2012-01-21 MED ORDER — PHENYLEPHRINE HCL 10 MG/ML IJ SOLN
INTRAMUSCULAR | Status: DC | PRN
  Administered 2012-01-21 (×3): 40 ug via INTRAVENOUS
  Administered 2012-01-21 (×2): 80 ug via INTRAVENOUS

## 2012-01-21 MED ORDER — LIDOCAINE HCL (CARDIAC) 20 MG/ML IV SOLN
INTRAVENOUS | Status: DC | PRN
  Administered 2012-01-21: 50 mg via INTRAVENOUS

## 2012-01-21 SURGICAL SUPPLY — 33 items
BRUSH SUPERTRAX BIOPSY (INSTRUMENTS) ×1 IMPLANT
BRUSH SUPERTRAX NDL-TIP CYTO (INSTRUMENTS) ×1 IMPLANT
CANISTER SUCTION 2500CC (MISCELLANEOUS) ×2 IMPLANT
CHANNEL WORK EXTEND EDGE 180 (KITS) IMPLANT
CHANNEL WORK EXTEND EDGE 45 (KITS) IMPLANT
CHANNEL WORK EXTEND EDGE 90 (KITS) IMPLANT
CLOTH BEACON ORANGE TIMEOUT ST (SAFETY) ×2 IMPLANT
CONT SPEC 4OZ CLIKSEAL STRL BL (MISCELLANEOUS) ×5 IMPLANT
COVER TABLE BACK 60X90 (DRAPES) ×2 IMPLANT
FILTER STRAW FLUID ASPIR (MISCELLANEOUS) ×1 IMPLANT
FORCEPS BIOP SUPERTRX PREMAR (INSTRUMENTS) ×1 IMPLANT
GLOVE EUDERMIC 7 POWDERFREE (GLOVE) ×3 IMPLANT
GOWN PREVENTION PLUS XLARGE (GOWN DISPOSABLE) ×2 IMPLANT
KIT LOCATABLE GUIDE (CANNULA) IMPLANT
KIT MARKER FIDUCIAL DELIVERY (KITS) IMPLANT
KIT PROCEDURE EDGE 180 (KITS) IMPLANT
KIT PROCEDURE EDGE 45 (KITS) IMPLANT
KIT PROCEDURE EDGE 90 (KITS) ×1 IMPLANT
KIT ROOM TURNOVER OR (KITS) ×2 IMPLANT
MARKER SKIN DUAL TIP RULER LAB (MISCELLANEOUS) ×2 IMPLANT
NDL SUPERTRX PREMARK BIOPSY (NEEDLE) IMPLANT
NEEDLE SUPERTRX PREMARK BIOPSY (NEEDLE) ×2 IMPLANT
NS IRRIG 1000ML POUR BTL (IV SOLUTION) ×2 IMPLANT
OIL SILICONE PENTAX (PARTS (SERVICE/REPAIRS)) ×2 IMPLANT
PAD ARMBOARD 7.5X6 YLW CONV (MISCELLANEOUS) ×4 IMPLANT
PATCHES PATIENT (LABEL) ×2 IMPLANT
SPONGE GAUZE 4X4 12PLY (GAUZE/BANDAGES/DRESSINGS) ×2 IMPLANT
SYR 20ML ECCENTRIC (SYRINGE) ×2 IMPLANT
SYR 30ML LL (SYRINGE) ×2 IMPLANT
SYR TB 1ML LUER SLIP (SYRINGE) ×1 IMPLANT
TOWEL OR 17X24 6PK STRL BLUE (TOWEL DISPOSABLE) ×2 IMPLANT
TRAP SPECIMEN MUCOUS 40CC (MISCELLANEOUS) ×2 IMPLANT
TUBE CONNECTING 12X1/4 (SUCTIONS) ×2 IMPLANT

## 2012-01-21 NOTE — Preoperative (Signed)
Beta Blockers   Reason not to administer Beta Blockers:Not Applicable 

## 2012-01-21 NOTE — Anesthesia Postprocedure Evaluation (Signed)
  Anesthesia Post-op Note  Patient: Beverly Lewis  Procedure(s) Performed: Procedure(s) (LRB): VIDEO BRONCHOSCOPY WITH ENDOBRONCHIAL NAVIGATION (N/A)  Patient Location: PACU  Anesthesia Type: General  Level of Consciousness: awake  Airway and Oxygen Therapy: Patient Spontanous Breathing  Post-op Pain: mild  Post-op Assessment: Post-op Vital signs reviewed  Post-op Vital Signs: Reviewed  Complications: No apparent anesthesia complications

## 2012-01-21 NOTE — Anesthesia Preprocedure Evaluation (Addendum)
Anesthesia Evaluation  Patient identified by MRN, date of birth, ID band Patient awake    Reviewed: Allergy & Precautions, H&P , NPO status , Patient's Chart, lab work & pertinent test results  Airway Mallampati: II TM Distance: >3 FB Neck ROM: Full    Dental  (+) Edentulous Upper, Edentulous Lower and Dental Advisory Given   Pulmonary shortness of breath, pneumonia -,  breath sounds clear to auscultation        Cardiovascular hypertension, Pt. on medications Rhythm:Regular Rate:Normal     Neuro/Psych CVA    GI/Hepatic Neg liver ROS, GERD-  Controlled,  Endo/Other  Type 2  Renal/GU negative Renal ROS     Musculoskeletal   Abdominal   Peds  Hematology   Anesthesia Other Findings   Reproductive/Obstetrics                          Anesthesia Physical Anesthesia Plan  ASA: IV  Anesthesia Plan: General   Post-op Pain Management:    Induction: Intravenous  Airway Management Planned: Oral ETT  Additional Equipment:   Intra-op Plan:   Post-operative Plan: Possible Post-op intubation/ventilation  Informed Consent: I have reviewed the patients History and Physical, chart, labs and discussed the procedure including the risks, benefits and alternatives for the proposed anesthesia with the patient or authorized representative who has indicated his/her understanding and acceptance.   Dental advisory given  Plan Discussed with: CRNA, Anesthesiologist and Surgeon  Anesthesia Plan Comments:        Anesthesia Quick Evaluation

## 2012-01-21 NOTE — Progress Notes (Signed)
Dr. Dorris Fetch at bedside.  Aware of low O2. Will continue to encourage CDB Attempting to wean pt off O2 so she can be d/c'd to home as planned.

## 2012-01-21 NOTE — H&P (View-Only) (Signed)
PCP is BLAND,VEITA J, MD Referring Provider is Bland, Veita J, MD  Chief Complaint  Patient presents with  . Follow-up    2 MONTH FU CHEST CT    HPI: Beverly Lewis is an 78-year-old woman who returns for followup of a right upper lobe nodule.  She had a CT in April of this year for followup of an aortic stent graft which showed a questionable nodule in the right upper lobe on the upper cuts. A CT of the chest was done to further evaluate and it showed 2.5 x 2.3 cm mass there also was an enlarged paratracheal node, and bilateral adrenal nodules. We were concerned this could be stage IV lung cancer and a PET CT was done. No other lesions were hypermetabolic, in addition the right upper lobe "mass" had enlarged which was suspicious for an infectious etiology. She was treated with a two-week course of Avelox and a followup chest x-ray showed suggestion that the nodule was smaller. She now returns for a 3 month followup scan to reassess the lesion.  In the interim since her last visit she's continued to have a frequent cough with clear sputum. She has not had any hemoptysis. She says she has had occasional chills, but no fevers or sweats. Overall she feels about the same as usual with no new complaints.   Past Medical History  Diagnosis Date  . Hypertension   . Hyperlipidemia   . Cerebrovascular accident history of mild cerebrovascular accident which caused some visual disturbance.   . Diabetes mellitus   . Morbid obesity     Past Surgical History  Procedure Date  . Abdominal aortic aneurysm repair 06/28/10    Stent Graft repair    . Exploratory laparotomy     For ectopic pregnancy  . Lumbar disc surgery   . Aortogram  03/03/11    with stenting , left external iliac artery    Family History  Problem Relation Age of Onset  . Heart disease Mother   . Hypertension Mother   . Heart disease Father   . Hypertension Father     Social History History  Substance Use Topics  . Smoking  status: Current Everyday Smoker -- 0.5 packs/day for 65 years    Types: Cigarettes  . Smokeless tobacco: Never Used  . Alcohol Use: No     She does not consume alcohol on a regular basis.    Current Outpatient Prescriptions  Medication Sig Dispense Refill  . allopurinol (ZYLOPRIM) 100 MG tablet Take 100 mg by mouth 2 (two) times daily.        . amLODipine (NORVASC) 10 MG tablet Take 10 mg by mouth daily.        . aspirin 325 MG tablet Take 325 mg by mouth daily.      . atorvastatin (LIPITOR) 20 MG tablet Take 20 mg by mouth daily.        . COLCRYS 0.6 MG tablet       . eprosartan-hydrochlorothiazide (TEVETEN HCT) 600-12.5 MG per tablet Take 1 tablet by mouth daily.       . HYDROcodone-acetaminophen (LORCET) 10-650 MG per tablet 1 tablet every 6 (six) hours as needed.       . JANUVIA 100 MG tablet daily.       . multivitamin (THERAGRAN) per tablet Take 1 tablet by mouth daily.        . NEXIUM 40 MG capsule Take 40 mg by mouth daily before breakfast.       .   NIASPAN 500 MG CR tablet Take 500 mg by mouth at bedtime.         Allergies  Allergen Reactions  . Asa Buff (Mag (Buffered Aspirin)     In tolerance in higher dosages.    Review of Systems  Constitutional: Positive for activity change (very limited activity).  Respiratory: Positive for cough (productive, clear). Negative for shortness of breath.   Cardiovascular: Negative for chest pain.  Genitourinary: Positive for frequency.  Musculoskeletal: Positive for joint swelling, arthralgias and gait problem.  Neurological:       Ministroke 10 years ago  All other systems reviewed and are negative.    BP 121/76  Pulse 75  Temp 98.6 F (37 C)  Resp 18  Ht 5' 4" (1.626 m)  Wt 252 lb (114.306 kg)  BMI 43.26 kg/m2  SpO2 94% Physical Exam  Vitals reviewed. Constitutional: She is oriented to person, place, and time.       Morbidly obese  HENT:  Head: Normocephalic and atraumatic.  Eyes: EOM are normal. Pupils are equal,  round, and reactive to light.  Neck: Neck supple. No thyromegaly present.  Cardiovascular: Normal rate, regular rhythm and normal heart sounds.   No murmur heard. Pulmonary/Chest: She has no wheezes.       Diminished BS bilaterally  Abdominal: Soft. There is no tenderness.  Musculoskeletal: She exhibits edema (1+).  Lymphadenopathy:    She has no cervical adenopathy.  Neurological: She is alert and oriented to person, place, and time. No cranial nerve deficit.  Skin: Skin is warm and dry.     Diagnostic Tests: CT Chest 01/12/12 IMPRESSION:  1. Interval enlargement of what is now a 3.7 x 2.5 cm  macrolobulated mass with spiculated margins and surrounding  architectural distortion in the right upper lobe. This lesion has  a highly aggressive appearance, however, based on the lack of  activity on prior PET CT 10/21/2011, this could conceivably be a  benign process. Continued attention on follow-up imaging or  correlation with biopsy may be warranted.  2. Atherosclerosis, including left main three-vessel coronary  artery disease. Please note that although the presence of coronary  artery calcium documents the presence of coronary artery disease,  the severity of this disease and any potential stenosis cannot be  assessed on this non-gated CT examination. Assessment for  potential risk factor modification, dietary therapy or  pharmacologic therapy may be warranted, if clinically indicated.  3. Appearance of the lung bases suggests areas of air trapping  related to small airways disease, as above.  4. Bilateral adrenal adenoma is again noted.  Impression: 78-year-old woman with an enlarging right upper lobe nodule. This is increased in size from 2.5 x 2.3 cm to 2.5 x 3.7 cm in 3 months. This lesion was not hypermetabolic on PET CT. Given his combination of increasing size in the absence of hypermetabolism an infectious etiology is still the most likely. However, cancer cannot be  definitively ruled out.  I recommended to her that we proceed with electromagnetic navigational bronchoscopy to obtain biopsies and cultures to better manage this issue. I discussed with her the general nature of the procedure, including the need for general anesthesia. I discussed with her the indications, risks, benefits, and alternatives. She understands the risks include those associated with general anesthesia as well as the risk of bleeding and pneumothorax associated with the biopsies. I do think given the anterior location of this that electromagnetic navigational will be essential to ensure adequate sampling.   This can usually be done on an outpatient basis, but occasionally requires overnight observation. She understands this is a diagnostic and therapeutic procedure.  Plan: ENB Monday, July 29. 

## 2012-01-21 NOTE — CV Procedure (Signed)
Patient found to have low sats and unable to wean from O2  CXR showed a right pneumothorax  Advised patient to have a right chest tube placed to reexpand the lung. She is aware of risks of bleeding and infection and agrees to proceed  Using sterile technique and 1% lidocaine local anesthetic a 35F CT was placed right chest. + rush of air with pleural entry. No ongoing air leak  Tolerated well.  Repeat CXR pending

## 2012-01-21 NOTE — Progress Notes (Signed)
Unable to wean off O2.  Rm. Air sats 74-79%. O2 reapplied. Dr. Dorris Fetch aware. CXR ordered.

## 2012-01-21 NOTE — Transfer of Care (Addendum)
Immediate Anesthesia Transfer of Care Note  Patient: Beverly Lewis  Procedure(s) Performed: Procedure(s) (LRB): VIDEO BRONCHOSCOPY WITH ENDOBRONCHIAL NAVIGATION (N/A)  Patient Location: PACU  Anesthesia Type: General  Level of Consciousness: awake, oriented and patient cooperative  Airway & Oxygen Therapy: Patient Spontanous Breathing and Patient connected to face mask oxygen; neb tx in PACU  Post-op Assessment: Report given to PACU RN  Post vital signs: Reviewed  Complications: No apparent anesthesia complications

## 2012-01-21 NOTE — Progress Notes (Signed)
Patient still in PACU She was unable to wean from O2 Sats low 70s on RA Not complaining of SOB  CXR shows a large right pneumothorax. She needs a right chest tube Will need to be admitted

## 2012-01-21 NOTE — Interval H&P Note (Signed)
History and Physical Interval Note:  01/21/2012 8:00 AM  Beverly Lewis  has presented today for surgery, with the diagnosis of right upper lobe lung mass  The various methods of treatment have been discussed with the patient and family. After consideration of risks, benefits and other options for treatment, the patient has consented to  Procedure(s) (LRB): VIDEO BRONCHOSCOPY WITH ENDOBRONCHIAL NAVIGATION (N/A) as a surgical intervention .  The patient's history has been reviewed, patient examined, no change in status, stable for surgery.  I have reviewed the patient's chart and labs.  Questions were answered to the patient's satisfaction.     [name] C

## 2012-01-21 NOTE — Brief Op Note (Signed)
01/21/2012  9:34 AM  PATIENT:  [name]  78 y.o. female  PRE-OPERATIVE DIAGNOSIS:  right upper lobe lung mass  POST-OPERATIVE DIAGNOSIS:  right upper lobe lung mass  PROCEDURE:  Procedure(s) (LRB): VIDEO BRONCHOSCOPY WITH ENDOBRONCHIAL NAVIGATION (N/A) Brushings, washings, biopsies and BAL  SURGEON:  Surgeon(s) and Role:    * Loreli Slot, MD - Primary  ANESTHESIA:   general  EBL:  Total I/O In: 700 [I.V.:700] Out: -   BLOOD ADMINISTERED:none  DRAINS: none   LOCAL MEDICATIONS USED:  NONE  SPECIMEN:  Source of Specimen:  right upper lobe mass  DISPOSITION OF SPECIMEN:  pathology and micro  PLAN OF CARE: Discharge to home after PACU  PATIENT DISPOSITION:  PACU - hemodynamically stable.   Delay start of Pharmacological VTE agent (>24hrs) due to surgical blood loss or risk of bleeding: not applicable

## 2012-01-22 ENCOUNTER — Other Ambulatory Visit: Payer: Self-pay

## 2012-01-22 ENCOUNTER — Inpatient Hospital Stay (HOSPITAL_COMMUNITY): Payer: Medicare Other

## 2012-01-22 DIAGNOSIS — R000 Tachycardia, unspecified: Secondary | ICD-10-CM

## 2012-01-22 DIAGNOSIS — R918 Other nonspecific abnormal finding of lung field: Secondary | ICD-10-CM

## 2012-01-22 DIAGNOSIS — R042 Hemoptysis: Secondary | ICD-10-CM

## 2012-01-22 HISTORY — DX: Tachycardia, unspecified: R00.0

## 2012-01-22 HISTORY — DX: Hemoptysis: R04.2

## 2012-01-22 LAB — GLUCOSE, CAPILLARY
Glucose-Capillary: 109 mg/dL — ABNORMAL HIGH (ref 70–99)
Glucose-Capillary: 124 mg/dL — ABNORMAL HIGH (ref 70–99)

## 2012-01-22 MED ORDER — HYDROCODONE-ACETAMINOPHEN 10-325 MG PO TABS: 1.0000 | ORAL_TABLET | ORAL | Status: AC | PRN

## 2012-01-22 NOTE — Op Note (Signed)
NAMEGALA, PADOVANO           ACCOUNT NO.:  [id number]  MEDICAL RECORD NO.:  [id number]  LOCATION:  6N11C                        FACILITY:  [facility]  PHYSICIAN:  Salvatore Decent. Dorris Fetch, M.D.DATE OF BIRTH:  20-Oct-1928  DATE OF PROCEDURE:  01/21/2012 DATE OF DISCHARGE:                              OPERATIVE REPORT   PREOPERATIVE DIAGNOSIS:  Right upper lobe mass.  POSTOPERATIVE DIAGNOSIS:  Right upper lobe mass.  PROCEDURE:  Electromagnetic navigational bronchoscopy with biopsies, brushings, washings, and bronchoalveolar lavage.  SURGEON:  Salvatore Decent. Dorris Fetch, MD  ASSISTANT:  None.  ANESTHESIA:  General.  FINDINGS:  Quick prep of initial specimens showed an abundance of cells, but no evidence of atypia or malignancy.  CLINICAL NOTE:  Ms. Beverly Lewis is an 78 year old woman who was recently found to have a right upper lobe mass.  There also was some paratracheal adenopathy and bilateral adrenal nodules.  A PET scan showed no hypermetabolic activity and an increase in the size of the mass.  This was felt to be most suspicious for an infectious etiology.  She was treated with antibiotics and had initial response, but now presents for followup CT and there has been increase in the size of the mass in the interim.  She was advised to undergo bronchoscopy with electromagnetic navigation for diagnosis in order to guide appropriate therapy.  The indications, risks, benefits, and alternatives were discussed in detail with the patient.  She understood and accepted the risks and agreed to proceed.  OPERATIVE NOTE:  [name] was brought to the operating room on January 21, 2012.  She was anesthetized and intubated.  Flexible fiberoptic bronchoscopy was performed, it revealed normal endobronchial anatomy and no endobronchial lesions.  There were abundant clear secretions.  The locatable guide for the navigational bronchoscopy was placed and bronchial mapping was performed.  The  bronchoscope then was advanced to the right upper lobe orifice and the catheter with locatable guide was advanced into the anterior segmental branch into the vicinity of the lesion, position was confirmed with fluoroscopy.  Multiple brushings were obtained with a needle brush, these were sent for quick prep.  While awaiting the results of that, multiple biopsies were obtained.  After obtaining the third biopsy, there was some bleeding noted.  Additional brushings were performed and needle aspirations were performed.  Epinephrine was instilled through the catheter into the site of the biopsies to help with the bleeding.  A second site had been selected on the mass for additional sampling. The catheter was readjusted to the second target and the process of brushings, biopsies and bronchoalveolar lavage was repeated.  The quick prep of the initial specimen returned with multiple ciliated cells, but no evidence of atypia or malignancy.  Portions of the specimens were sent for culture as well as pathology.  The catheter was withdrawn and final inspection was made and there was no ongoing bleeding visible within the bonchi.  The bronchoscope was withdrawn.  The patient was extubated in the operating room and taken to the postanesthetic care unit in good condition.     Salvatore Decent Dorris Fetch, M.D.     SCH/MEDQ  D:  01/21/2012  T:  01/22/2012  Job:  [id number]

## 2012-01-22 NOTE — Progress Notes (Signed)
Pt's HR in the 110's, BP - 108/50  MD notified and new order received.  Will continue to monitor.  Beverly Lewis

## 2012-01-22 NOTE — Progress Notes (Signed)
PHARMACIST - PHYSICIAN COMMUNICATION DR:   Elsie Amis.  CONCERNING:  78yo female s/p bronchoscopy for hemoptysis.  She has no VTE prophylaxis   RECOMMENDATION: Consider placing SCDs for VTE px.  Marisue Humble, PharmD Clinical Pharmacist [facility]

## 2012-01-22 NOTE — Discharge Summary (Signed)
Physician Discharge Summary  Patient ID: Beverly Lewis MRN: [id number] DOB/AGE: [birth year] 78 y.o.  Admit date: 01/21/2012 Discharge date: 01/22/2012  Admission Diagnoses:  Right upper lobe lung mass  Discharge Diagnoses:         Right upper lobe lung mass   Iatrogenic pneumothorax         Discharged Condition: good  History of Present Illness:   Beverly Lewis is an 78 yo African American Female who was incidentally found to have a mass is her right upper lobe after undergoing routine CT scan for follow up of a previously placed Aortic Stent graft.  The patient underwent CT chest for better imaging of the suspected mass which revealed a 2.5 x 2.3 cm mass with an enlarged paratracheal node with bilateral adrenal nodules.  There was some concern that this could be stage IV lung cancer and the patient was sent for PET CT scan which did not reveal any evidence of hypermetabolic activity.  The mass in question had enlarged but was suspicious for infectious etiology.  She was treated with a 2 week course of Avelox.  Follow up CXR performed showed the nodule appeared to be smaller.  She presented to Beverly Lewis office for a 3 month follow up with CT scan which revealed the right upper lobe mass had in fact grown in the interrim.  It was now measuring 2.5 x 3.7cm.  Despite the fact this lesion was not hypermetabolic on PET and infectious etiology is most likely the culprit, cancer could not be ruled out.  It was felt that since the lesion was enlarging the patient would benefit from ENB for biopsy of the lesion for definitive diagnosis.  The risks and benefits of the procedure were explained to the patient and she was agreeable to proceed.    Hospital Course:   Beverly Lewis presented to [facility] on 01/21/2012.  She was taken to the operating room and underwent ENB with biopsies.  The initial quick prep at pathology did not reveal any evidence of malignancy or atypical cells.  She  tolerated the procedure well was extubated and taken to the PACU in stable condition.  The patient was difficult to wean off oxygen post procedure.  Chest xray was obtained and revealed a large right sided pneumothorax.  Subsequent chest tube was placed with resolution of pneumothorax.  The patient was admitted to the hospital for chest tube management.  POD #1 the patient's chest tube did not have an air leak present.  Chest xray obtained revealed a tiny apical pneumothorax.  Her chest tube was removed without difficulty.  A follow up chest xray will be obtained. If apical pneumothorax does not enlarge and patient can be weaned off oxygen she will be discharged home today.  She will need to follow up with Beverly Lewis on 01/26/2012 at 3:30pm.  She will need to have a chest xray performed at 2:30pm at [facility].  Of note her chest tube site is sutured closed.  The suture will remain in placed and be removed at her visit with Beverly Lewis    Disposition: 01-Home or Self Care  Discharge Orders    Future Appointments: Provider: Department: Dept Phone: Center:   10/04/2012 11:45 AM Pryor Ochoa, MD [facility] [phone] [facility]     Medication List  As of 01/22/2012  1:59 PM   TAKE these medications         allopurinol 100 MG tablet   Commonly known as: ZYLOPRIM  Take 100 mg by mouth daily.      amLODipine 10 MG tablet   Commonly known as: NORVASC   Take 10 mg by mouth daily.      aspirin EC 81 MG tablet   Take 81 mg by mouth daily.      atorvastatin 20 MG tablet   Commonly known as: LIPITOR   Take 20 mg by mouth daily.      COLCRYS 0.6 MG tablet   Generic drug: colchicine   Take 0.6 mg by mouth 2 (two) times daily as needed. For gout      cyclobenzaprine 10 MG tablet   Commonly known as: FLEXERIL   Take 10 mg by mouth at bedtime as needed. For muscle spasms      eprosartan-hydrochlorothiazide 600-12.5 MG per tablet   Commonly known as: TEVETEN HCT   Take 1 tablet by  mouth daily.      HYDROcodone-acetaminophen 10-650 MG per tablet   Commonly known as: LORCET   Take 1 tablet by mouth 2 (two) times daily as needed. For pain      JANUVIA 100 MG tablet   Generic drug: sitaGLIPtin   Take 100 mg by mouth daily.      lidocaine 5 %   Commonly known as: LIDODERM   Place 1 patch onto the skin daily. Remove & Discard patch within 12 hours or as directed by MD      NEXIUM 40 MG capsule   Generic drug: esomeprazole   Take 40 mg by mouth daily before breakfast.      Vitamin D2 2000 UNITS Tabs   Take 1 tablet by mouth daily.             SignedLowella Dandy 01/22/2012, 1:59 PM

## 2012-01-22 NOTE — Care Management Note (Signed)
  Page 1 of 1   01/22/2012     10:06:15 AM   CARE MANAGEMENT NOTE 01/22/2012  Patient:  Beverly Lewis, Beverly Lewis   Account Number:  [id number]  Date Initiated:  01/22/2012  Documentation initiated by:  Ronny Flurry  Subjective/Objective Assessment:   Right upper lobe mass.   DX: Pneumothorax, post biopsy, right     Action/Plan:   chest tube to 20 cm suction   Anticipated DC Date:  01/25/2012   Anticipated DC Plan:  HOME/SELF CARE         Choice offered to / List presented to:             Status of service:  In process, will continue to follow Medicare Important Message given?   (If response is "NO", the following Medicare IM given date fields will be blank) Date Medicare IM given:   Date Additional Medicare IM given:    Discharge Disposition:    Per UR Regulation:  Reviewed for med. necessity/level of care/duration of stay  If discussed at Long Length of Stay Meetings, dates discussed:    Comments:

## 2012-01-22 NOTE — Progress Notes (Addendum)
[address]            [place] [address]          [phone]     1 Day Post-Op  Procedure(s) (LRB): VIDEO BRONCHOSCOPY WITH ENDOBRONCHIAL NAVIGATION (N/A) Subjective: Hurts to cough, some hemoptysis but clearing  Objective  Temp:  [98.1 F (36.7 C)-98.8 F (37.1 C)] 98.7 F (37.1 C) (08/02 0605) Pulse Rate:  [61-98] 98  (08/02 0605) Resp:  [17-20] 20  (08/02 0605) BP: (110-150)/(51-103) 110/65 mmHg (08/02 0605) SpO2:  [86 %-100 %] 100 % (08/02 0605) Weight:  [256 lb 2.8 oz (116.2 kg)] 256 lb 2.8 oz (116.2 kg) (08/01 2329)   Intake/Output Summary (Last 24 hours) at 01/22/12 1044 Last data filed at 01/22/12 0909  Gross per 24 hour  Intake    603 ml  Output     30 ml  Net    573 ml       General appearance: alert, cooperative, appears stated age and no distress Heart: regular rate and rhythm Lungs: coarse inspir BS  Lab Results:  [facility] 01/20/12 0840  NA 140  K 3.8  CL 103  CO2 24  GLUCOSE 110*  BUN 22  CREATININE 1.08  CALCIUM 9.4  MG --  PHOS --    Basename 01/20/12 0840  AST 15  ALT 13  ALKPHOS 97  BILITOT 0.2*  PROT 7.7  ALBUMIN 3.7   No results found for this basename: LIPASE:2,AMYLASE:2 in the last 72 hours  Basename 01/20/12 0840  WBC 7.9  NEUTROABS --  HGB 13.6  HCT 41.8  MCV 93.3  PLT 211   No results found for this basename: CKTOTAL:4,CKMB:4,TROPONINI:4 in the last 72 hours No components found with this basename: POCBNP:3 No results found for this basename: DDIMER in the last 72 hours No results found for this basename: HGBA1C in the last 72 hours No results found for this basename: CHOL,HDL,LDLCALC,TRIG,CHOLHDL in the last 72 hours No results found for this basename: TSH,T4TOTAL,FREET3,T3FREE,THYROIDAB in the last 72 hours No results found for this basename: VITAMINB12,FOLATE,FERRITIN,TIBC,IRON,RETICCTPCT in the last 72 hours  Medications: Scheduled    . albuterol      . allopurinol   100 mg Oral Daily  . amLODipine  10 mg Oral Daily  . aspirin EC  81 mg Oral Daily  . atorvastatin  20 mg Oral q1800  . irbesartan  150 mg Oral Daily   And  . hydrochlorothiazide  12.5 mg Oral Daily  . HYDROcodone-acetaminophen      . HYDROmorphone      . HYDROmorphone      . insulin aspart  0-15 Units Subcutaneous TID WC  . lidocaine  1 patch Transdermal Q24H  . linagliptin  5 mg Oral Daily  . oxyCODONE      . pantoprazole  40 mg Oral Daily  . sodium chloride  3 mL Intravenous Q12H  . DISCONTD: eprosartan-hydrochlorothiazide  1 tablet Oral Daily     Radiology/Studies:  Dg Chest Left Decubitus  01/21/2012  *RADIOLOGY REPORT*  Clinical Data:  Status post bronchoscopy, concern for possible right pneumothorax  CHEST - LEFT DECUBITUS  Comparison: Portable chest x-ray obtained today, 01/21/2012 at 13 14  Findings: Left lateral decubitus view confirms a moderate right- sided pneumothorax.  IMPRESSION: Left lateral decubitus view confirms a moderate right-sided pneumothorax.  Findings relayed to Dr. Dorris Fetch by Dr. Archer Asa via  telephone at this 1:30pm on 01/21/12.  Original Report Authenticated By: Alvino Blood Chest Port 1 View  01/22/2012  *RADIOLOGY REPORT*  Clinical Data: Postop bronchoscopy for assessment of right upper lobe lesion  PORTABLE CHEST - 1 VIEW  Comparison: Portable chest x-ray of 01/21/2012  Findings: The right chest tube has retracted slightly and there may be a tiny right apical pneumothorax present.  Mild basilar linear atelectasis remains.  Cardiomegaly is stable  IMPRESSION: Right chest tube remains.  Question tiny right apical pneumothorax.  Original Report Authenticated By: Juline Patch, M.D.   Dg Chest Port 1 View  01/21/2012  *RADIOLOGY REPORT*  Clinical Data: Chest tube placement.  PORTABLE CHEST - 1 VIEW  Comparison: 01/21/2012, 1218 hours.  Findings: Cardiomegaly unchanged.  New right thoracostomy tube is present.  Re-expansion of the right lung with complete  resolution of right pneumothorax.  Small amount of subcutaneous emphysema along the chest tube tract.  The left lung appears unchanged. Lung volumes are lower than on the prior exam earlier today.  IMPRESSION: 1.  Interval insertion of right thoracostomy tube with the tip at the superior right hilum.  No residual right pneumothorax. 2.  Unchanged cardiomegaly.  Lung volumes lower than on prior.  Original Report Authenticated By: Andreas Newport, M.D.   Dg Chest Port 1 View  01/21/2012  **ADDENDUM** CREATED: 01/21/2012 13:33:28  A repeat view was performed with the oxygen, and cardiac monitor lines removed. There is a moderate right apical pneumothorax.  No significant interval progression since the study earlier today.  Findings relayed to Dr. Dorris Fetch by Dr. Archer Asa via telephone at this 1:30pm on 01/21/12.  **END ADDENDUM** SIGNED BY: Sterling Big, M.D.   01/21/2012  *RADIOLOGY REPORT*  Clinical Data:  Status post bronchoscopy  PORTABLE CHEST - 1 VIEW  Comparison: Chest x-ray dated yesterday, 01/20/2012  Findings: Images mildly limited by portable technique, and overlying lines and tubes.  Interval development of a moderate right pneumothorax. Increased right perihilar streaky opacities favored to represent atelectasis.  Additionally, increased linear opacities in the left base, and left upper lung also favored to reflect atelectasis.  Overall, inspiratory volumes are low. Cardiac and mediastinal silhouettes are unchanged.  IMPRESSION:  1.  Interval development of a moderate right apical pneumothorax. In the region of the apical pleural lines, there are several lines and tubes (oxygen tubing, and cardiac leads) and it is possible that the perceived pneumothorax is in part artifactual.  The study will be repeated to confirm.  Initial findings relayed to Dr. Dorris Fetch by Dr. Archer Asa via telephone at 12:30 p.m. on 01/21/2012.  2.  Lower inspiratory volumes with bibasilar, right hilar and left upper lung  atelectasis. Original Report Authenticated By: Alvino Blood C-arm Bronchoscopy  01/21/2012  CLINICAL DATA: Naviagation   C-ARM BRONCHOSCOPY  Fluoroscopy was utilized by the requesting physician.  No radiographic  interpretation.      Chest tube- scant drainage, no air leak, but cough is weak    INR: Will add last result for INR, ABG once components are confirmed Will add last 4 CBG results once components are confirmed  Assessment/Plan: S/P Procedure(s) (LRB): VIDEO BRONCHOSCOPY WITH ENDOBRONCHIAL NAVIGATION (N/A)  1. Stable, tiny ?pntx. Will place to H2O seal and repeat cxr in am    LOS: 1 day    GOLD,WAYNE E 8/2/201310:44 AM    CT with no air leak with repeated coughing. There is a lot of fluctuation in the tube. CXR shows an insignificant  apical pneumo.  D/C CT If f/u CXR is OK and sat > 88% on RA - will d/c home

## 2012-01-23 ENCOUNTER — Inpatient Hospital Stay (HOSPITAL_COMMUNITY): Payer: Medicare Other

## 2012-01-23 LAB — GLUCOSE, CAPILLARY
Glucose-Capillary: 117 mg/dL — ABNORMAL HIGH (ref 70–99)
Glucose-Capillary: 164 mg/dL — ABNORMAL HIGH (ref 70–99)
Glucose-Capillary: 104 mg/dL — ABNORMAL HIGH (ref 70–99)

## 2012-01-23 NOTE — Progress Notes (Addendum)
Pt's HR in 120's when moved up to [facility], became congested and had blood tinged sputum with productive cough. O2 sats stayed in the 90's on RA.

## 2012-01-23 NOTE — Progress Notes (Signed)
                    [address]            [place] [address]          [phone]     2 Days Post-Op Procedure(s) (LRB): VIDEO BRONCHOSCOPY WITH ENDOBRONCHIAL NAVIGATION (N/A)  Subjective: Remained in hospital last night after coughing spell with tachycardia.  Feeling better this am, sats stable on RA.  Objective: Vital signs in last 24 hours: Patient Vitals for the past 24 hrs:  BP Temp Temp src Pulse Resp SpO2  01/23/12 0601 119/51 mmHg 98.9 F (37.2 C) Oral 106  18  94 %  01/23/12 0313 - - - 106  - 95 %  01/23/12 0210 122/58 mmHg - - 98  - -  01/23/12 0148 130/45 mmHg 99.3 F (37.4 C) Oral 98  20  93 %  01/23/12 0025 125/70 mmHg 99.3 F (37.4 C) Oral 100  18  96 %  01/22/12 2213 120/58 mmHg 98.6 F (37 C) Oral 100  16  100 %  01/22/12 2045 130/95 mmHg 99 F (37.2 C) Oral 90  17  94 %  01/22/12 1836 - - - 117  - -  01/22/12 1825 - - - 112  - 99 %  01/22/12 1819 108/50 mmHg - - - - -  01/22/12 1452 - - - - - 98 %  01/22/12 1351 133/65 mmHg 98.2 F (36.8 C) Oral 84  20  100 %   Current Weight  01/21/12 256 lb 2.8 oz (116.2 kg)     Intake/Output from previous day: 08/02 0701 - 08/03 0700 In: 720 [P.O.:720] Out: -     PHYSICAL EXAM:  Heart:RRR Lungs: clear Wound:stable   Lab Results: CBC:No results found for this basename: WBC:2,HGB:2,HCT:2,PLT:2 in the last 72 hours BMET: No results found for this basename: NA:2,K:2,CL:2,CO2:2,GLUCOSE:2,BUN:2,CREATININE:2,CALCIUM:2 in the last 72 hours  PT/INR: No results found for this basename: LABPROT,INR in the last 72 hours  NFA:[id number]:  1. Stable tiny right apical pneumothorax.    Assessment/Plan: S/P Procedure(s) (LRB): VIDEO BRONCHOSCOPY WITH ENDOBRONCHIAL NAVIGATION (N/A) Pt stable for discharge, however, she is unsure if her family will be available to take her today.  If she can arrange family assistance, we will plan d/c home today.  If not, hopefully can get her out in the am.   LOS: 2 days    Kourtlynn Trevor H 01/23/2012

## 2012-01-23 NOTE — Progress Notes (Signed)
Pt resting comfortably, currently no pain, HR 106, O2 sats 95% RA

## 2012-01-24 LAB — GLUCOSE, CAPILLARY

## 2012-01-24 LAB — CULTURE, RESPIRATORY W GRAM STAIN

## 2012-01-24 NOTE — Discharge Summary (Signed)
[address]            [place] [address]          [phone]         Discharge Summary-ADDENDUM  Name: Beverly Lewis DOB: 1929-03-23 78 y.o. MRN: [id number]   Admission Date: 01/21/2012 Discharge Date:      Additional Hospital Course: The patient was ready for discharge home on 01/21/2102.  However, she developed some tachycardia, coughing and bloody sputum.  She was kept overnight for further observation.  Her chest x-ray remained stable and her heart rate improved. Oxygen sats were greater than 90% on room air.  She was ready for discharge on 01/23/2012, but she had no transportation.  She was kept in the hospital an additional 24 hours so family support could be arranged.  She has been evaluated on today's date, and is ready for discharge.    Recent vital signs:  Filed Vitals:   01/24/12 0523  BP: 124/62  Pulse: 105  Temp: 98.4 F (36.9 C)  Resp: 18    Recent laboratory studies:  CBC:No results found for this basename: WBC:2,HGB:2,HCT:2,PLT:2 in the last 72 hours BMET: No results found for this basename: NA:2,K:2,CL:2,CO2:2,GLUCOSE:2,BUN:2,CREATININE:2,CALCIUM:2 in the last 72 hours  PT/INR: No results found for this basename: LABPROT,INR in the last 72 hours   Discharge Medications:   Medication List  As of 01/24/2012 12:36 PM   STOP taking these medications         HYDROcodone-acetaminophen 10-650 MG per tablet         TAKE these medications         allopurinol 100 MG tablet   Commonly known as: ZYLOPRIM   Take 100 mg by mouth daily.      amLODipine 10 MG tablet   Commonly known as: NORVASC   Take 10 mg by mouth daily.      aspirin EC 81 MG tablet   Take 81 mg by mouth daily.      atorvastatin 20 MG tablet   Commonly known as: LIPITOR   Take 20 mg by mouth daily.      COLCRYS 0.6 MG tablet   Generic drug: colchicine   Take 0.6 mg by mouth 2 (two) times daily as needed. For gout      cyclobenzaprine 10 MG  tablet   Commonly known as: FLEXERIL   Take 10 mg by mouth at bedtime as needed. For muscle spasms      eprosartan-hydrochlorothiazide 600-12.5 MG per tablet   Commonly known as: TEVETEN HCT   Take 1 tablet by mouth daily.      HYDROcodone-acetaminophen 10-325 MG per tablet   Commonly known as: NORCO   Take 1 tablet by mouth every 4 (four) hours as needed (pain).      JANUVIA 100 MG tablet   Generic drug: sitaGLIPtin   Take 100 mg by mouth daily.      lidocaine 5 %   Commonly known as: LIDODERM   Place 1 patch onto the skin daily. Remove & Discard patch within 12 hours or as directed by MD      NEXIUM 40 MG capsule   Generic drug: esomeprazole   Take 40 mg by mouth daily before breakfast.      Vitamin D2 2000 UNITS Tabs   Take 1 tablet by mouth daily.  Discharge Instructions:  The patient is to refrain from driving, heavy lifting or strenuous activity.  May shower daily and clean incisions with soap and water.  May resume regular diet.   Follow Up:  Discharge Orders    Future Appointments: Provider: Department: Dept Phone: Center:   10/04/2012 11:45 AM Pryor Ochoa, MD [facility] [phone] [facility]      Follow-up Information    Follow up with Loreli Slot, MD on 01/26/2012. (Appointment is at 3:30pm)    Contact information:   [address] [place] [address] [phone]       Follow up with [facility] on 01/26/2012. (Please get chest xray performed at 2:30pm, located on first floor of [facility])           Beverly Lewis 01/24/2012, 12:36 PM

## 2012-01-24 NOTE — Progress Notes (Signed)
                    [address]            [place] [address]          [phone]     3 Days Post-Op Procedure(s) (LRB): VIDEO BRONCHOSCOPY WITH ENDOBRONCHIAL NAVIGATION (N/A)  Subjective: Feels better today, breathing stable.  Objective: Vital signs in last 24 hours: Patient Vitals for the past 24 hrs:  BP Temp Temp src Pulse Resp SpO2  01/24/12 0523 124/62 mmHg 98.4 F (36.9 C) Oral 105  18  98 %  01/23/12 2115 121/40 mmHg 98.7 F (37.1 C) Oral 111  18  96 %   Current Weight  01/21/12 256 lb 2.8 oz (116.2 kg)     Intake/Output from previous day: 08/03 0701 - 08/04 0700 In: -  Out: 400 [Urine:400]    PHYSICAL EXAM:  Heart: RRR Lungs: clear Extremities: warm    Lab Results: CBC:No results found for this basename: WBC:2,HGB:2,HCT:2,PLT:2 in the last 72 hours BMET: No results found for this basename: NA:2,K:2,CL:2,CO2:2,GLUCOSE:2,BUN:2,CREATININE:2,CALCIUM:2 in the last 72 hours  PT/INR: No results found for this basename: LABPROT,INR in the last 72 hours    Assessment/Plan: S/P Procedure(s) (LRB): VIDEO BRONCHOSCOPY WITH ENDOBRONCHIAL NAVIGATION (N/A) Stable, off O2 and sats >90%.   Home today.   LOS: 3 days    COLLINS,GINA H 01/24/2012

## 2012-01-25 MED FILL — Hydrocodone-Acetaminophen Tab 5-325 MG: ORAL | Qty: 1 | Status: AC

## 2012-01-29 ENCOUNTER — Telehealth: Payer: Self-pay | Admitting: Thoracic Surgery (Cardiothoracic Vascular Surgery)

## 2012-01-29 NOTE — Telephone Encounter (Signed)
Beverly Lewis was unable to arrange transportation to the office to discuss her biopsy results, therefore I had to give her the news by phone.  She was informed that the biopsies had shown a small focus of cancer and that our treatment options were surgery or radiation  She is a poor operative candidate and does not want to have surgery.  I will set her up with an appointment to see Radiation Oncology.  I am away next week, but will see her back in the office when I return the following week

## 2012-02-09 ENCOUNTER — Encounter: Payer: Self-pay | Admitting: Radiation Oncology

## 2012-02-10 ENCOUNTER — Ambulatory Visit
Admission: RE | Admit: 2012-02-10 | Discharge: 2012-02-10 | Disposition: A | Discharge status: Home / Self Care | Payer: Medicare Other | Source: Ambulatory Visit | Attending: Radiation Oncology | Admitting: Radiation Oncology

## 2012-02-10 ENCOUNTER — Encounter: Payer: Self-pay | Admitting: Radiation Oncology

## 2012-02-10 VITALS — BP 125/76 | HR 76 | Temp 98.1°F | Resp 18 | Ht 62.0 in | Wt 250.0 lb

## 2012-02-10 DIAGNOSIS — F17200 Nicotine dependence, unspecified, uncomplicated: Secondary | ICD-10-CM | POA: Insufficient documentation

## 2012-02-10 DIAGNOSIS — E119 Type 2 diabetes mellitus without complications: Secondary | ICD-10-CM | POA: Insufficient documentation

## 2012-02-10 DIAGNOSIS — Z510 Encounter for antineoplastic radiation therapy: Secondary | ICD-10-CM | POA: Insufficient documentation

## 2012-02-10 DIAGNOSIS — E785 Hyperlipidemia, unspecified: Secondary | ICD-10-CM | POA: Insufficient documentation

## 2012-02-10 DIAGNOSIS — R079 Chest pain, unspecified: Secondary | ICD-10-CM | POA: Insufficient documentation

## 2012-02-10 DIAGNOSIS — R918 Other nonspecific abnormal finding of lung field: Secondary | ICD-10-CM

## 2012-02-10 DIAGNOSIS — K219 Gastro-esophageal reflux disease without esophagitis: Secondary | ICD-10-CM | POA: Insufficient documentation

## 2012-02-10 DIAGNOSIS — I69998 Other sequelae following unspecified cerebrovascular disease: Secondary | ICD-10-CM | POA: Insufficient documentation

## 2012-02-10 DIAGNOSIS — I739 Peripheral vascular disease, unspecified: Secondary | ICD-10-CM | POA: Insufficient documentation

## 2012-02-10 DIAGNOSIS — I10 Essential (primary) hypertension: Secondary | ICD-10-CM | POA: Insufficient documentation

## 2012-02-10 DIAGNOSIS — Z79899 Other long term (current) drug therapy: Secondary | ICD-10-CM | POA: Insufficient documentation

## 2012-02-10 DIAGNOSIS — I719 Aortic aneurysm of unspecified site, without rupture: Secondary | ICD-10-CM | POA: Insufficient documentation

## 2012-02-10 DIAGNOSIS — C3410 Malignant neoplasm of upper lobe, unspecified bronchus or lung: Secondary | ICD-10-CM | POA: Insufficient documentation

## 2012-02-10 DIAGNOSIS — H539 Unspecified visual disturbance: Secondary | ICD-10-CM | POA: Insufficient documentation

## 2012-02-10 MED ORDER — OXYCODONE-ACETAMINOPHEN 10-325 MG PO TABS: 1.0000 | ORAL_TABLET | ORAL | Status: AC | PRN

## 2012-02-10 NOTE — Progress Notes (Signed)
Patient presents to the clinic today accompanied by her son and grandson for a consult with Dr. Michell Heinrich to discuss the role of radiation therapy in the treatment of lung ca. Patient is alert and oriented to person, place, and time. No distress noted. Patient being pushed in wheelchair. Patient refused to stand for weight. Pleasant affect noted. Patient denies pain at this time. Patient reports this morning she had right side pain at site of old chest tube site. Patient reports that motrin resolved the pain. Patient denies shortness of breath. Patient states,"often my back is weak." Patient denies cough. Patient reports that she continues to smoke daily. Patient denies nausea, vomiting, headache, and dizziness. Patient reports a good appetite. Patient reports that she sleep well in her lift chair but, doesn't sleep well in her bed. Reported all findings to Dr. Michell Heinrich.

## 2012-02-10 NOTE — Progress Notes (Signed)
See progress note under physician encounter. 

## 2012-02-11 ENCOUNTER — Ambulatory Visit
Admission: RE | Admit: 2012-02-11 | Discharge: 2012-02-11 | Disposition: A | Discharge status: Home / Self Care | Payer: Medicare Other | Source: Ambulatory Visit | Attending: Radiation Oncology | Admitting: Radiation Oncology

## 2012-02-11 DIAGNOSIS — C3410 Malignant neoplasm of upper lobe, unspecified bronchus or lung: Secondary | ICD-10-CM

## 2012-02-11 NOTE — Progress Notes (Signed)
[facility] Radiation Oncology Simulation and Treatment Planning Note   Name: Beverly Lewis MRN: [id number]  Date: 02/11/2012  DOB: 12/06/1928  Status: outpatient  DIAGNOSIS: There were no encounter diagnoses.  SIDE: right  CONSENT VERIFIED: yes  SET UP AND IMMOBILIZATION: Patient is setup supine in a vac loc with a custom moldable pillow for head and neck immobilization   NARRATIVE: The patient was brought to the CT Simulation planning suite.  Identity was confirmed.  All relevant records and images related to the planned course of therapy were reviewed.  Then, the patient was positioned in a stable reproducible clinical set-up for radiation therapy.  CT images were obtained.  Skin markings were placed.  A four dimensional simulation was then performed to track tumor movement throughout the patients' breathing cycle. The CT images were loaded into the planning software where the target and avoidance structures were contoured.  The GTV was outlined on the free breathing, 4D and MIP image sets.  The radiation prescription was entered and confirmed.   TREATMENT PLANNING NOTE:  Treatment planning then occurred. I have requested 3D simulation with [facility] of the spinal cord, total lungs and gross tumor volume. I have also requested mlcs and an isodose plan.   Special treatment procedure will be performed as [name] will be receiving high dose per fraction.

## 2012-02-11 NOTE — Progress Notes (Signed)
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 female  was undergoing routine screening for her aortic aneurysm. On a CT scan she was noted to have a enlarged right upper lobe. This measured 2.7 x 2.3 x 3.0 cm. A PET scan performed in May showed this to be slightly increased in size to 3.5 x 3.1 cm. The SUV was 3.5 and infectious etiology was suspected. She was placed on antibiotics and a repeat CT scan was performed on July 23. While on antibiotics she said that her cough and shortness of breath improved. However on her July CT scan the mass and enlarged to measure about 3.7 x 2.5 cm. No evidence of metastatic disease to mediastinal hilar lymph nodes was noted. No evidence of metastatic disease elsewhere in the body was noted as well. We reviewed her case in our thoracic oncology conference and a biopsy was recommended. This was performed on 01/21/2012 by Dr. Dorris Fetch through bronchoscopy. The biopsy was positive for well-differentiated adenocarcinoma. She unfortunately developed a small right pneumothorax and had a chest tube placed. She's had this removed and presented today for her treatment options. She has declined surgery with Dr. Dorris Fetch. She is accompanied by her son and grandson. Her son is here from [place] through September 9 and would like to have her treatment performed by that time so that he can help with her transportation. Her main complaint today is pain at the chest tube site and chronic back pain. She requests a prescription for something stronger than Vicodin which she has been taking for her back pain. She does not have a primary care physician. She reports  hemoptysis after her biopsy. She reports no headaches visual changes or new gait instability.  PREVIOUS RADIATION THERAPY: No  PAST MEDICAL HISTORY:  has a past medical history of Hypertension; Hyperlipidemia; Cerebrovascular accident (history of mild cerebrovascular accident which caused some visual disturbance. ); Diabetes mellitus; Morbid obesity; Lung mass; Shortness of breath; Pneumonia; Peripheral vascular disease; GERD (gastroesophageal reflux disease); Arthritis; Pneumothorax, iatrogenic (8/2/113-discharge note); Cough with hemoptysis (01/22/12); and Tachycardia (01/22/12).    PAST SURGICAL HISTORY: Past Surgical History  Procedure Date  . Abdominal aortic aneurysm repair 06/28/10    Stent Graft repair    . Exploratory laparotomy     For ectopic pregnancy  . Lumbar disc surgery   . Aortogram  03/03/11    with stenting , left external iliac artery  . Appendectomy   . Navigational bronchoscopy     Right Upper Lobe Mass with Biopsies, Brushings, Washings, and bronchoalveolar Lavage:  . Paratracheal adenopathy and bilateral adrenal nodules     FAMILY HISTORY: family history includes Heart disease in her father and mother and Hypertension in her father and mother.  SOCIAL HISTORY:  reports that she has been smoking Cigarettes.  She has a 32.5 pack-year smoking history. She has never used smokeless tobacco. She reports that she does not drink alcohol or use illicit drugs.  ALLERGIES: Juanetta Snow (mag  MEDICATIONS:  Current Outpatient Prescriptions  Medication Sig Dispense Refill  . allopurinol (ZYLOPRIM) 100 MG tablet Take 100 mg by mouth daily.       Marland Kitchen amLODipine (NORVASC) 10 MG tablet Take 10 mg by mouth  daily.        . atorvastatin (LIPITOR) 20 MG tablet Take 20 mg by mouth daily.        . cyclobenzaprine (FLEXERIL) 10 MG tablet Take 10 mg by mouth at bedtime as needed. For muscle spasms      . eprosartan-hydrochlorothiazide (TEVETEN HCT) 600-12.5 MG per tablet Take 1 tablet by mouth  daily.       . Ergocalciferol (VITAMIN D2) 2000 UNITS TABS Take 1 tablet by mouth daily.      Marland Kitchen HYDROcodone-acetaminophen (LORCET) 10-650 MG per tablet       . JANUVIA 100 MG tablet Take 100 mg by mouth daily.       Marland Kitchen lidocaine (LIDODERM) 5 % Place 1 patch onto the skin daily. Remove & Discard patch within 12 hours or as directed by MD      . NEXIUM 40 MG capsule Take 40 mg by mouth daily before breakfast.       . NIASPAN 500 MG CR tablet       . aspirin EC 81 MG tablet Take 81 mg by mouth daily.      Marland Kitchen COLCRYS 0.6 MG tablet Take 0.6 mg by mouth 2 (two) times daily as needed. For gout      . oxyCODONE-acetaminophen (PERCOCET) 10-325 MG per tablet Take 1 tablet by mouth every 4 (four) hours as needed for pain.  60 tablet  0    REVIEW OF SYSTEMS:  A 15 point review of systems is documented in the electronic medical record. This was obtained by the nursing staff. However, I reviewed this with the patient to discuss relevant findings and make appropriate changes.  Pertinent items are noted in HPI.   PHYSICAL EXAM:  height is 5\' 2"  (1.575 m) and weight is 250 lb (113.399 kg). Her oral temperature is 98.1 F (36.7 C). Her blood pressure is 125/76 and her pulse is 76. Her respiration is 18.   She is an obese female in no distress sitting comfortably in a wheelchair. She is alert and oriented x3. She has no respiratory distress. Her lung sounds are distant but clear bilaterally. Cranial nerves II through XII are tested and intact.  LABORATORY DATA:  Lab Results  Component Value Date   WBC 7.9 01/20/2012   HGB 13.6 01/20/2012   HCT 41.8 01/20/2012   MCV 93.3 01/20/2012   PLT 211 01/20/2012   Lab Results  Component Value Date   NA 140 01/20/2012   K 3.8 01/20/2012   CL 103 01/20/2012   CO2 24 01/20/2012   Lab Results  Component Value Date   ALT 13 01/20/2012   AST 15 01/20/2012   ALKPHOS 97 01/20/2012   BILITOT 0.2* 01/20/2012     RADIOGRAPHY: Dg Chest 2 View  01/23/2012  *RADIOLOGY REPORT*   Clinical Data: Pneumothorax, shortness of breath  CHEST - 2 VIEW  Comparison:   the previous day's study  Findings: Low lung volumes.  Tiny residual right apical pneumothorax stable.  Stable moderate cardiomegaly.  Patchy perihilar and basilar atelectasis/consolidation, left greater than right as before.  Cannot exclude small left pleural effusion. Aortic stent graft noted.  Regional bones unremarkable.  IMPRESSION:  1.  Stable tiny right apical pneumothorax.  Original Report Authenticated By: Osa Craver, M.D.   Dg Chest 2 View Within Previous 72 Hours.  Films Obtained On Friday Are Acceptable For Monday And Tuesday Cases  01/20/2012  *RADIOLOGY REPORT*  Clinical Data: Preop  CHEST - 2  VIEW  Comparison: 01/12/2012  Findings: Cardiomediastinal silhouette is stable.  Again noted mass- like consolidation in the right upper lobe anteriorly best seen in the lateral view measures about 2.4 cm.  No acute infiltrate or pulmonary edema.  Stable mild degenerative changes thoracic spine.  IMPRESSION: Again noted mass-like consolidation in the right upper lobe anteromedially.  No acute infiltrate or pulmonary edema.  Original Report Authenticated By: Natasha Mead, M.D.   Ct Chest Wo Contrast  01/12/2012  *RADIOLOGY REPORT*  Clinical Data: Follow-up evaluation of lung mass.  CT CHEST WITHOUT CONTRAST  Technique:  Multidetector CT imaging of the chest was performed following the standard protocol without IV contrast.  Comparison: Chest CT 09/30/2011.  PET CT 10/21/2011.  Findings:  Mediastinum: Heart size is normal. There is no significant pericardial fluid, thickening or pericardial calcification. There is atherosclerosis of the thoracic aorta, the great vessels of the mediastinum and the coronary arteries, including calcified atherosclerotic plaque in the left main, left anterior descending, left circumflex and right coronary arteries. No pathologically enlarged mediastinal or hilar lymph nodes. Please note that  accurate exclusion of hilar adenopathy is limited on noncontrast CT scans.  Esophagus is unremarkable in appearance.  Lungs/Pleura: Previously noted nodule in the medial aspect of the right upper lobe has increased in size and is now a 3.7 x 2.5 cm mass.  This is associated with architectural distortion and some chronic volume loss.  No other definite new or enlarging suspicious appearing pulmonary nodules or masses are identified.  A small area of scarring in the inferior segment of the lingula.  Mosaic pattern of ground glass attenuation interspersed with areas of lucency demonstrating geographic margins in the lung bases bilaterally, suggestive of air trapping from a small airways disease.  No acute consolidative airspace disease.  No pleural effusions.  Upper Abdomen: Large low attenuation bilateral adrenal nodules (largest on the left measuring 2.8 x 2.5 cm), similar to priors, compatible with adrenal adenomas.  Musculoskeletal: There are no aggressive appearing lytic or blastic lesions noted in the visualized portions of the skeleton.  IMPRESSION: 1.  Interval enlargement of what is now a 3.7 x 2.5 cm macrolobulated mass with spiculated margins and surrounding architectural distortion in the right upper lobe.  This lesion has a highly aggressive appearance, however, based on the lack of activity on prior PET CT 10/21/2011, this could conceivably be a benign process.  Continued attention on follow-up imaging or correlation with biopsy may be warranted. 2. Atherosclerosis, including left main three-vessel coronary artery disease. Please note that although the presence of coronary artery calcium documents the presence of coronary artery disease, the severity of this disease and any potential stenosis cannot be assessed on this non-gated CT examination.  Assessment for potential risk factor modification, dietary therapy or pharmacologic therapy may be warranted, if clinically indicated. 3.  Appearance of the lung  bases suggests areas of air trapping related to small airways disease, as above. 4.  Bilateral adrenal adenoma is again noted.  Original Report Authenticated By: Florencia Reasons, M.D.   Dg Chest Left Decubitus  01/21/2012  *RADIOLOGY REPORT*  Clinical Data:  Status post bronchoscopy, concern for possible right pneumothorax  CHEST - LEFT DECUBITUS  Comparison: Portable chest x-ray obtained today, 01/21/2012 at 13 14  Findings: Left lateral decubitus view confirms a moderate right- sided pneumothorax.  IMPRESSION: Left lateral decubitus view confirms a moderate right-sided pneumothorax.  Findings relayed to Dr. Dorris Fetch by Dr. Archer Asa via telephone at this 1:30pm on 01/21/12.  Original Report Authenticated By: Alvino Blood Chest Port 1 View  01/22/2012  *RADIOLOGY REPORT*  Clinical Data: Chest tube removal.  PORTABLE CHEST - 1 VIEW  Comparison: 01/22/2012, 0819 hours.  Findings: Interval removal of right thoracostomy tube.  Lower lung volumes with increasing subsegmental atelectasis.  Tiny right apical pneumothorax persists, with faint pleural line.  Low volumes accentuate the cardiopericardial silhouette.  IMPRESSION: Interval removal of right thoracostomy tube with tiny right apical pneumothorax unchanged.  Original Report Authenticated By: Andreas Newport, M.D.   Dg Chest Port 1 View  01/22/2012  *RADIOLOGY REPORT*  Clinical Data: Postop bronchoscopy for assessment of right upper lobe lesion  PORTABLE CHEST - 1 VIEW  Comparison: Portable chest x-ray of 01/21/2012  Findings: The right chest tube has retracted slightly and there may be a tiny right apical pneumothorax present.  Mild basilar linear atelectasis remains.  Cardiomegaly is stable  IMPRESSION: Right chest tube remains.  Question tiny right apical pneumothorax.  Original Report Authenticated By: Juline Patch, M.D.   Dg Chest Port 1 View  01/21/2012  *RADIOLOGY REPORT*  Clinical Data: Chest tube placement.  PORTABLE CHEST - 1 VIEW  Comparison:  01/21/2012, 1218 hours.  Findings: Cardiomegaly unchanged.  New right thoracostomy tube is present.  Re-expansion of the right lung with complete resolution of right pneumothorax.  Small amount of subcutaneous emphysema along the chest tube tract.  The left lung appears unchanged. Lung volumes are lower than on the prior exam earlier today.  IMPRESSION: 1.  Interval insertion of right thoracostomy tube with the tip at the superior right hilum.  No residual right pneumothorax. 2.  Unchanged cardiomegaly.  Lung volumes lower than on prior.  Original Report Authenticated By: Andreas Newport, M.D.   Dg Chest Port 1 View  01/21/2012  **ADDENDUM** CREATED: 01/21/2012 13:33:28  A repeat view was performed with the oxygen, and cardiac monitor lines removed. There is a moderate right apical pneumothorax.  No significant interval progression since the study earlier today.  Findings relayed to Dr. Dorris Fetch by Dr. Archer Asa via telephone at this 1:30pm on 01/21/12.  **END ADDENDUM** SIGNED BY: Sterling Big, M.D.   01/21/2012  *RADIOLOGY REPORT*  Clinical Data:  Status post bronchoscopy  PORTABLE CHEST - 1 VIEW  Comparison: Chest x-ray dated yesterday, 01/20/2012  Findings: Images mildly limited by portable technique, and overlying lines and tubes.  Interval development of a moderate right pneumothorax. Increased right perihilar streaky opacities favored to represent atelectasis.  Additionally, increased linear opacities in the left base, and left upper lung also favored to reflect atelectasis.  Overall, inspiratory volumes are low. Cardiac and mediastinal silhouettes are unchanged.  IMPRESSION:  1.  Interval development of a moderate right apical pneumothorax. In the region of the apical pleural lines, there are several lines and tubes (oxygen tubing, and cardiac leads) and it is possible that the perceived pneumothorax is in part artifactual.  The study will be repeated to confirm.  Initial findings relayed to Dr.  Dorris Fetch by Dr. Archer Asa via telephone at 12:30 p.m. on 01/21/2012.  2.  Lower inspiratory volumes with bibasilar, right hilar and left upper lung atelectasis. Original Report Authenticated By: Alvino Blood C-arm Bronchoscopy  01/21/2012  CLINICAL DATA: Naviagation   C-ARM BRONCHOSCOPY  Fluoroscopy was utilized by the requesting physician.  No radiographic  interpretation.        IMPRESSION: 78 year old female with a newly diagnosed T2 N0 adenocarcinoma of the right upper lobe  PLAN: I met with  Ms. Andrey Campanile and her family today regarding options for treatment. We discussed the role of SBRT in treatment of early stage lung cancer. We discussed the results of prospective nonrandomized trials showing control rates in the 85-90% range. We discussed the use of breath hold and immobilization techniques for simulation. We discussed the process of simulation. We discussed the possibility of damage to any structures within the chest. We discussed the possibility that her back pain and/or shoulder pain could grow worse because of her body positioning. I written her a one-time prescription for Percocet to help with her pain during and after radiation treatment when it may be exacerbated.. I written her for 60 tablets and the been very clear that there would be no further refills after this. We were able to schedule her for simulation on the 22nd. Hopefully that will allow [place] to have her treatment completed by September 9. She has signed informed consent and agree to proceed forward. All of her and her family's questions were answered. I spent 60 minutes  face to face with the patient and more than 50% of that time was spent in counseling and/or coordination of care.   ------------------------------------------------  Lurline Hare, MD

## 2012-02-11 NOTE — Progress Notes (Signed)
Met with patient to discuss RO billing. Pt advised she might need help with bills, and will let us know.  Dx: 162.3 Upper, lobe, lung   Attending Rad: Dr. Iona Hansen Tx: SRS x5 Robot

## 2012-02-12 NOTE — Addendum Note (Signed)
Encounter addended by: Trixy Loyola Mintz Sherina Stammer, RN on: 02/12/2012  7:07 PM<BR>     Documentation filed: Charges VN

## 2012-02-12 NOTE — Addendum Note (Signed)
Encounter addended by: Dyani Babel Mintz Amen Staszak, RN on: 02/12/2012  7:03 PM<BR>     Documentation filed: Charges VN

## 2012-02-16 LAB — FUNGUS CULTURE W SMEAR: Fungal Smear: NONE SEEN

## 2012-02-18 ENCOUNTER — Ambulatory Visit
Admission: RE | Admit: 2012-02-18 | Discharge: 2012-02-18 | Disposition: A | Discharge status: Home / Self Care | Payer: Medicare Other | Source: Ambulatory Visit | Attending: Radiation Oncology | Admitting: Radiation Oncology

## 2012-02-18 ENCOUNTER — Encounter: Payer: Self-pay | Admitting: Radiation Oncology

## 2012-02-18 DIAGNOSIS — R918 Other nonspecific abnormal finding of lung field: Secondary | ICD-10-CM

## 2012-02-18 NOTE — Progress Notes (Signed)
Completed post sim education with patient and her son today. Patient alert and oriented to person, place, and time. No distress noted. Patient being pushed in wheelchair. Pleasant affect noted. Patient denies pain at this time. Oriented patient to staff and routine of the clinic. Provided patient with FATIGUE handout and reviewed information. Provided patient with this writer's business card and encouraged to call with needs. Provided patient with calendar and reviewed upcoming treatment appointment dates and times. All questions answered. Patient and son verbalized understanding of all things reviewed.

## 2012-02-21 LAB — FUNGUS CULTURE W SMEAR: Fungal Smear: NONE SEEN

## 2012-02-23 ENCOUNTER — Ambulatory Visit
Admission: RE | Admit: 2012-02-23 | Discharge: 2012-02-23 | Disposition: A | Discharge status: Home / Self Care | Payer: Medicare Other | Source: Ambulatory Visit | Attending: Radiation Oncology | Admitting: Radiation Oncology

## 2012-02-23 ENCOUNTER — Encounter: Payer: Self-pay | Admitting: Radiation Oncology

## 2012-02-23 VITALS — BP 112/70 | HR 97 | Resp 18 | Wt 250.0 lb

## 2012-02-23 DIAGNOSIS — R918 Other nonspecific abnormal finding of lung field: Secondary | ICD-10-CM

## 2012-02-23 NOTE — Progress Notes (Signed)
Weekly Management Note Current Dose: 20  Gy  Projected Dose: 50 Gy   Narrative:  The patient presents for routine under treatment assessment.  CBCT/MVCT images/Port film x-rays were reviewed.  The chart was checked. Doing well. No complaints except continued right chest pain.  Taking mostly advil liquigels for pain relief. No worsening breathing function  Physical Findings: Weight: 250 lb (113.399 kg). Unchanged  Impression:  The patient is tolerating radiation.  Plan:  Continue treatment as planned.

## 2012-02-23 NOTE — Progress Notes (Signed)
Patient presents to the clinic today accompanied by her son for PUT with Dr. Michell Heinrich. Patient is alert and oriented to person, place, and time. No distress noted. Patient being pushed in wheelchair. Patient refused to stand for weight. Pleasant affect noted. Patient denies pain at this time. Patient reports this morning she had right side pain at site of old chest tube site. Patient reports that motrin resolved the pain. Patient denies shortness of breath. Patient states,"often my back is weak." Patient reports a productive cough with clear sputum. Patient reports that she continues to smoke daily. Patient's son has purchased her a fake cigarette to aid in helping her stop. Patient denies nausea, vomiting, headache, and dizziness. Patient reports that following her initial treatment she felt nauseated once she got home but, it resolved on its own. Patient reports a good appetite. Patient reports that she sleep well in her lift chair but, doesn't sleep well in her bed. Reported all findings to Dr. Michell Heinrich.

## 2012-02-24 ENCOUNTER — Telehealth: Payer: Self-pay | Admitting: Radiation Oncology

## 2012-02-24 NOTE — Telephone Encounter (Signed)
Phoned patient an encouraged her to take an antacid prior to radiation treatment each day per Dr. Luciano Cutter order. Patient verbalized understanding.Routed message to Dr. Michell Heinrich.

## 2012-02-24 NOTE — Telephone Encounter (Signed)
Patient phoned this morning at [phone] complaining of unexplained nausea but, denied emesis. Directed patient to take her antacid. Encouraged BRAT diet and small frequent meals for today. Encouraged patient to rest and apply a cool cloth to neck. Patient verbalized understanding. Phoned patient at [phone] to inquire about status. Patient verbalized she was well and nausea had resolved. Encouraged patient to phone with future needs and she verbalized understanding. Routed message to Dr. Michell Heinrich.

## 2012-02-25 ENCOUNTER — Ambulatory Visit
Admission: RE | Admit: 2012-02-25 | Discharge: 2012-02-25 | Disposition: A | Discharge status: Home / Self Care | Payer: Medicare Other | Source: Ambulatory Visit | Attending: Radiation Oncology | Admitting: Radiation Oncology

## 2012-02-25 ENCOUNTER — Encounter: Payer: Self-pay | Admitting: Radiation Oncology

## 2012-02-26 NOTE — Progress Notes (Signed)
Late entry from 02/25/2012 0953. Received patient and her son in the clinic today following treatment. Patient requested this writer assess the sore area on her right side. A single stitch was noted at old chest tube site. Patient reports that he chest tube was removed over a month ago. Area around site red and swollen with a scant amount of yellow puss. Per Dr. Luciano Cutter order removed single stitch, cleansed area with sterile saline, applied triple action antibiotic then, covered site with gauze dressing reinforced with paper tape. Patient tolerated well. Encouraged patient to cleanse area twice daily and apply neosporin. Also, encouraged patient to keep area clean and dry. Patient and her son both verbalized understanding. Discharged patient to son' care.

## 2012-02-29 ENCOUNTER — Ambulatory Visit: Payer: Medicare Other

## 2012-03-01 ENCOUNTER — Ambulatory Visit
Admission: RE | Admit: 2012-03-01 | Discharge: 2012-03-01 | Disposition: A | Discharge status: Home / Self Care | Payer: Medicare Other | Source: Ambulatory Visit | Attending: Radiation Oncology | Admitting: Radiation Oncology

## 2012-03-01 DIAGNOSIS — R918 Other nonspecific abnormal finding of lung field: Secondary | ICD-10-CM

## 2012-03-01 NOTE — Progress Notes (Signed)
  Radiation Oncology         (336) [phone] ________________________________  Name: Beverly Lewis MRN: [id number]  Date: 03/01/2012  DOB: [birth year]  Stereotactic Body Radiotherapy Treatment Procedure Note  NARRATIVE:  Beverly Lewis was brought to the stereotactic radiation treatment machine and placed supine on the CT couch. The patient was set up for stereotactic body radiotherapy on the body fix pillow.  3D TREATMENT PLANNING AND DOSIMETRY:  The patient's radiation plan was reviewed and approved prior to starting treatment.  It showed 3-dimensional radiation distributions overlaid onto the planning CT.  The [facility] for the target structures as well as the organs at risk were reviewed. The documentation of this is filed in the radiation oncology EMR.  SIMULATION VERIFICATION:  The patient underwent CT imaging on the treatment unit.  These were carefully aligned to document that the ablative radiation dose would cover the target volume and maximally spare the nearby organs at risk according to the planned distribution.  SPECIAL TREATMENT PROCEDURE: Beverly Lewis received high dose ablative stereotactic body radiotherapy to the planned target volume without unforeseen complications. Treatment was delivered uneventfully. The high doses associated with stereotactic body radiotherapy and the significant potential risks require careful treatment set up and patient monitoring constituting a special treatment procedure   STEREOTACTIC TREATMENT MANAGEMENT:  Following delivery, the patient was evaluated clinically. The patient tolerated treatment without significant acute effects, and was discharged to home in stable condition.    PLAN: Continue treatment as planned.  ________________________________  Billie Lade, PhD, MD

## 2012-03-02 ENCOUNTER — Ambulatory Visit: Payer: Medicare Other

## 2012-03-03 ENCOUNTER — Encounter: Payer: Self-pay | Admitting: Radiation Oncology

## 2012-03-03 ENCOUNTER — Ambulatory Visit
Admission: RE | Admit: 2012-03-03 | Discharge: 2012-03-03 | Disposition: A | Discharge status: Home / Self Care | Payer: Medicare Other | Source: Ambulatory Visit | Attending: Radiation Oncology | Admitting: Radiation Oncology

## 2012-03-03 NOTE — Progress Notes (Signed)
  Radiation Oncology         (336) [phone] ________________________________  Name: Beverly Lewis MRN: [id number]  Date: 03/03/2012  DOB: 1928-10-12  Stereotactic Body Radiotherapy Treatment Procedure Note  NARRATIVE:  Beverly Lewis was brought to the stereotactic radiation treatment machine and placed supine on the CT couch. The patient was set up for stereotactic body radiotherapy on the body fix pillow.  3D TREATMENT PLANNING AND DOSIMETRY:  The patient's radiation plan was reviewed and approved prior to starting treatment.  It showed 3-dimensional radiation distributions overlaid onto the planning CT.  The [facility] for the target structures as well as the organs at risk were reviewed. The documentation of this is filed in the radiation oncology EMR.  SIMULATION VERIFICATION:  The patient underwent CT imaging on the treatment unit.  These were carefully aligned to document that the ablative radiation dose would cover the target volume and maximally spare the nearby organs at risk according to the planned distribution.  SPECIAL TREATMENT PROCEDURE: [name] received high dose ablative stereotactic body radiotherapy to the planned target volume without unforeseen complications. Treatment was delivered uneventfully. She received a further 1000 cGy for a cumulative dose of 5000 cGy in 5 sessions. The high doses associated with stereotactic body radiotherapy and the significant potential risks require careful treatment set up and patient monitoring constituting a special treatment procedure   STEREOTACTIC TREATMENT MANAGEMENT:  Following delivery, the patient was evaluated clinically. The patient tolerated treatment without significant acute effects, and was discharged to home in stable condition.    PLAN: Radiation therapy completed. Followup visit with Dr. Michell Heinrich in one month.  ------------------------------------------------        [name], MD

## 2012-03-04 LAB — AFB CULTURE WITH SMEAR (NOT AT ARMC)
Acid Fast Smear: NONE SEEN
Acid Fast Smear: NONE SEEN

## 2012-03-07 NOTE — Progress Notes (Signed)
  Radiation Oncology         [phone]) [phone] ________________________________  Name: Beverly Lewis MRN: [id number]  Date: 02/25/2012  DOB: 15-Apr-1929  Stereotactic Body Radiotherapy Treatment Procedure Note  NARRATIVE:  Beverly Lewis was brought to the stereotactic radiation treatment machine and placed supine on the CT couch. The patient was set up for stereotactic body radiotherapy on the body fix pillow.  3D TREATMENT PLANNING AND DOSIMETRY:  The patient's radiation plan was reviewed and approved prior to starting treatment.  It showed 3-dimensional radiation distributions overlaid onto the planning CT.  The [facility] for the target structures as well as the organs at risk were reviewed. The documentation of this is filed in the radiation oncology EMR.  SIMULATION VERIFICATION:  The patient underwent CT imaging on the treatment unit.  These were carefully aligned to document that the ablative radiation dose would cover the target volume and maximally spare the nearby organs at risk according to the planned distribution.  SPECIAL TREATMENT PROCEDURE: Beverly Lewis received high dose ablative stereotactic body radiotherapy to the planned target volume without unforeseen complications. Treatment was delivered uneventfully. The high doses associated with stereotactic body radiotherapy and the significant potential risks require careful treatment set up and patient monitoring constituting a special treatment procedure   STEREOTACTIC TREATMENT MANAGEMENT:  Following delivery, the patient was evaluated clinically. The patient tolerated treatment without significant acute effects, and was discharged to home in stable condition.    PLAN: Continue treatment as planned.

## 2012-03-07 NOTE — Progress Notes (Signed)
  Radiation Oncology         [phone]) [phone] ________________________________  Name: Beverly Lewis MRN: [id number]  Date: 02/18/2012  DOB: 06-Aug-1928  Stereotactic Body Radiotherapy Treatment Procedure Note  NARRATIVE:  Beverly Lewis was brought to the stereotactic radiation treatment machine and placed supine on the CT couch. The patient was set up for stereotactic body radiotherapy on the body fix pillow.  3D TREATMENT PLANNING AND DOSIMETRY:  The patient's radiation plan was reviewed and approved prior to starting treatment.  It showed 3-dimensional radiation distributions overlaid onto the planning CT.  The [facility] for the target structures as well as the organs at risk were reviewed. The documentation of this is filed in the radiation oncology EMR.  SIMULATION VERIFICATION:  The patient underwent CT imaging on the treatment unit.  These were carefully aligned to document that the ablative radiation dose would cover the target volume and maximally spare the nearby organs at risk according to the planned distribution.  SPECIAL TREATMENT PROCEDURE: Beverly Lewis received high dose ablative stereotactic body radiotherapy to the planned target volume without unforeseen complications. Treatment was delivered uneventfully. The high doses associated with stereotactic body radiotherapy and the significant potential risks require careful treatment set up and patient monitoring constituting a special treatment procedure   STEREOTACTIC TREATMENT MANAGEMENT:  Following delivery, the patient was evaluated clinically. The patient tolerated treatment without significant acute effects, and was discharged to home in stable condition.    PLAN: Continue treatment as planned.

## 2012-03-07 NOTE — Progress Notes (Signed)
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   She struggled with back pain after treatment which is a chronic issue for her.   Plan: The patient has completed radiation treatment. The patient will return to radiation oncology clinic for routine followup in one month. I advised them to call or return sooner if they have any questions or concerns related to their recovery or treatment.  ------------------------------------------------  Lurline Hare, MD

## 2012-03-07 NOTE — Progress Notes (Signed)
°  Radiation Oncology         [phone]) [phone] ________________________________  Name: Beverly Lewis MRN: [id number]  Date: 02/23/2012  DOB: 01-13-1929  Stereotactic Body Radiotherapy Treatment Procedure Note  NARRATIVE:  Beverly Lewis was brought to the stereotactic radiation treatment machine and placed supine on the CT couch. The patient was set up for stereotactic body radiotherapy on the body fix pillow.  3D TREATMENT PLANNING AND DOSIMETRY:  The patient's radiation plan was reviewed and approved prior to starting treatment.  It showed 3-dimensional radiation distributions overlaid onto the planning CT.  The [facility] for the target structures as well as the organs at risk were reviewed. The documentation of this is filed in the radiation oncology EMR.  SIMULATION VERIFICATION:  The patient underwent CT imaging on the treatment unit.  These were carefully aligned to document that the ablative radiation dose would cover the target volume and maximally spare the nearby organs at risk according to the planned distribution.  SPECIAL TREATMENT PROCEDURE: [name] received high dose ablative stereotactic body radiotherapy to the planned target volume without unforeseen complications. Treatment was delivered uneventfully. The high doses associated with stereotactic body radiotherapy and the significant potential risks require careful treatment set up and patient monitoring constituting a special treatment procedure   STEREOTACTIC TREATMENT MANAGEMENT:  Following delivery, the patient was evaluated clinically. The patient tolerated treatment without significant acute effects, and was discharged to home in stable condition.    PLAN: Continue treatment as planned.  ________________________________

## 2012-03-31 ENCOUNTER — Ambulatory Visit
Admission: RE | Admit: 2012-03-31 | Discharge: 2012-03-31 | Disposition: A | Discharge status: Home / Self Care | Payer: Medicare Other | Source: Ambulatory Visit | Attending: Radiation Oncology | Admitting: Radiation Oncology

## 2012-03-31 ENCOUNTER — Encounter: Payer: Self-pay | Admitting: Radiation Oncology

## 2012-03-31 VITALS — BP 130/80 | HR 84 | Temp 98.7°F | Resp 20 | Wt 243.0 lb

## 2012-03-31 DIAGNOSIS — C3490 Malignant neoplasm of unspecified part of unspecified bronchus or lung: Secondary | ICD-10-CM

## 2012-03-31 HISTORY — DX: Malignant neoplasm of unspecified part of unspecified bronchus or lung: C34.90

## 2012-03-31 NOTE — Progress Notes (Signed)
   Department of Radiation Oncology  Phone:  [phone] Fax:        [phone]   Name: Beverly Lewis   DOB: 10-27-1928  MRN: [id number]    Date: 03/31/2012  Follow Up Visit Note  Diagnosis: T2N0 Adenoa CA of the right upper lobe  Interval since last radiation: 1 month  Interval History: Beverly Lewis presents today for routine followup.  Her son is returning to [place]. She continues to have some pain after chest tube sites. She had some diarrhea after radiation but this was resolved when she ate some chicken and dumplings. She takes Advil with good pain relief. She has occasional productive cough and shortness of breath continues with minimal activity. She continues to smoke about half a pack per day. She has not noticed any change in her breathing symptoms.  Allergies:  Allergies  Allergen Reactions  . Asa Buff (Mag (Buffered Aspirin)     In tolerance in higher dosages.    Medications:  Current Outpatient Prescriptions  Medication Sig Dispense Refill  . allopurinol (ZYLOPRIM) 100 MG tablet Take 100 mg by mouth daily.       Marland Kitchen amLODipine (NORVASC) 10 MG tablet Take 10 mg by mouth daily.        Marland Kitchen aspirin EC 81 MG tablet Take 81 mg by mouth daily.      Marland Kitchen atorvastatin (LIPITOR) 20 MG tablet Take 20 mg by mouth daily.        Marland Kitchen COLCRYS 0.6 MG tablet Take 0.6 mg by mouth 2 (two) times daily as needed. For gout      . cyclobenzaprine (FLEXERIL) 10 MG tablet Take 10 mg by mouth at bedtime as needed. For muscle spasms      . eprosartan-hydrochlorothiazide (TEVETEN HCT) 600-12.5 MG per tablet Take 1 tablet by mouth daily.       . Ergocalciferol (VITAMIN D2) 2000 UNITS TABS Take 1 tablet by mouth daily.      Marland Kitchen HYDROcodone-acetaminophen (LORCET) 10-650 MG per tablet       . JANUVIA 100 MG tablet Take 100 mg by mouth daily.       Marland Kitchen lidocaine (LIDODERM) 5 % Place 1 patch onto the skin daily. Remove & Discard patch within 12 hours or as directed by MD      . NEXIUM 40 MG capsule  Take 40 mg by mouth daily before breakfast.       . NIASPAN 500 MG CR tablet       . oxyCODONE-acetaminophen (PERCOCET) 10-325 MG per tablet         Physical Exam:   weight is 243 lb (110.224 kg). Her oral temperature is 98.7 F (37.1 C). Her blood pressure is 130/80 and her pulse is 84. Her respiration is 20 and oxygen saturation is 100%.  She is obese female sitting in a wheelchair. She is in no respiratory distress  IMPRESSION: Beverly Lewis is a 78 y.o. female status post SBRT to a right upper lobe lesion  PLAN:  Beverly Lewis looks great and did well following her treatment. I will plan a scan in about a month for a baseline and then return to clinic to see her in 6 months with another CT scan. She knows to contact me with any questions or concerns in the interim. She asked that these notes be faxed to Dr. Corliss Skains, her rheumatologist.     Lurline Hare, MD

## 2012-03-31 NOTE — Progress Notes (Signed)
Pt c/o arthritic pain which is chronic, mainly shoulders, arms. Slight pain in right side from previous chest tube. Takes Advil prn w/good relief. Prod cough w/white sputum, sob w/minimal activity. Continues to smoke < 1/2 ppd. Appetite good, fatigues easily.

## 2012-04-01 ENCOUNTER — Telehealth: Payer: Self-pay | Admitting: *Deleted

## 2012-04-01 NOTE — Telephone Encounter (Signed)
CALLED PATIENT TO INFORM OF TESTS AND FU, SPOKE WITH PATIENT AND SHE IS AWARE OF THESE APPTS. 

## 2012-05-02 ENCOUNTER — Other Ambulatory Visit (HOSPITAL_COMMUNITY): Payer: Medicare Other

## 2012-05-02 ENCOUNTER — Telehealth: Payer: Self-pay | Admitting: *Deleted

## 2012-05-02 NOTE — Telephone Encounter (Signed)
CALLED PATIENT TO ASK QUESTION, SPOKE WITH PATIENT 

## 2012-05-05 ENCOUNTER — Ambulatory Visit (HOSPITAL_COMMUNITY)
Admission: RE | Admit: 2012-05-05 | Discharge: 2012-05-05 | Disposition: A | Discharge status: Home / Self Care | Payer: Medicare Other | Source: Ambulatory Visit | Attending: Radiation Oncology | Admitting: Radiation Oncology

## 2012-05-05 DIAGNOSIS — C3490 Malignant neoplasm of unspecified part of unspecified bronchus or lung: Secondary | ICD-10-CM

## 2012-05-05 DIAGNOSIS — Z9221 Personal history of antineoplastic chemotherapy: Secondary | ICD-10-CM | POA: Insufficient documentation

## 2012-05-05 MED ORDER — IOHEXOL 300 MG/ML  SOLN
80.0000 mL | Freq: Once | INTRAMUSCULAR | Status: AC | PRN
  Administered 2012-05-05: 80 mL via INTRAVENOUS

## 2012-10-03 ENCOUNTER — Encounter: Payer: Self-pay | Admitting: Vascular Surgery

## 2012-10-04 ENCOUNTER — Ambulatory Visit: Payer: Medicare Other | Admitting: Vascular Surgery

## 2012-10-04 ENCOUNTER — Other Ambulatory Visit: Payer: Medicare Other

## 2012-10-17 ENCOUNTER — Encounter: Payer: Self-pay | Admitting: Vascular Surgery

## 2012-10-18 ENCOUNTER — Encounter: Payer: Self-pay | Admitting: Vascular Surgery

## 2012-10-18 ENCOUNTER — Ambulatory Visit
Admission: RE | Admit: 2012-10-18 | Discharge: 2012-10-18 | Disposition: A | Discharge status: Home / Self Care | Payer: Medicare Other | Source: Ambulatory Visit | Attending: Vascular Surgery | Admitting: Vascular Surgery

## 2012-10-18 ENCOUNTER — Ambulatory Visit (INDEPENDENT_AMBULATORY_CARE_PROVIDER_SITE_OTHER): Payer: Medicare Other | Admitting: Vascular Surgery

## 2012-10-18 VITALS — BP 136/62 | HR 93 | Ht 62.0 in | Wt 240.0 lb

## 2012-10-18 DIAGNOSIS — I714 Abdominal aortic aneurysm, without rupture, unspecified: Secondary | ICD-10-CM

## 2012-10-18 DIAGNOSIS — Z48812 Encounter for surgical aftercare following surgery on the circulatory system: Secondary | ICD-10-CM

## 2012-10-18 MED ORDER — IOHEXOL 350 MG/ML SOLN
80.0000 mL | Freq: Once | INTRAVENOUS | Status: AC | PRN
  Administered 2012-10-18: 80 mL via INTRAVENOUS

## 2012-10-18 NOTE — Progress Notes (Signed)
Subjective:     Patient ID: Beverly Lewis, female   DOB: January 25, 1929, 79 y.o.   MRN: [id number]  HPI this 79 year old female is now 2 years post emergent insertion of aortic stent graft for symptomatic abdominal aortic aneurysm. She has chronic back and abdominal discomfort but unchanged from last year. She does not ambulate well. She denies any other specific symptoms.  Past Medical History  Diagnosis Date  . Hypertension   . Hyperlipidemia   . Cerebrovascular accident history of mild cerebrovascular accident which caused some visual disturbance.   . Diabetes mellitus   . Morbid obesity   . Lung mass     Right upper Lobe  . Shortness of breath   . Pneumonia     hx  . Peripheral vascular disease     hx blood clot 30 yrs ago leg  . GERD (gastroesophageal reflux disease)   . Arthritis   . Pneumothorax, iatrogenic 8/2/113-discharge note    Post Biospy  . Cough with hemoptysis 01/22/12    Prior to discharge  . Tachycardia 01/22/12    Prior to discharge  . Lung cancer 01/21/12    History  Substance Use Topics  . Smoking status: Current Every Day Smoker -- 0.50 packs/day for 65 years    Types: Cigarettes  . Smokeless tobacco: Never Used  . Alcohol Use: No     Comment: She does not consume alcohol on a regular basis.    Family History  Problem Relation Age of Onset  . Heart disease Mother   . Hypertension Mother   . Heart disease Father   . Hypertension Father     Allergies  Allergen Reactions  . Asa Buff (Mag (Buffered Aspirin)     In tolerance in higher dosages.    Current outpatient prescriptions:allopurinol (ZYLOPRIM) 100 MG tablet, Take 100 mg by mouth daily. , Disp: , Rfl: ;  amLODipine (NORVASC) 10 MG tablet, Take 10 mg by mouth daily.  , Disp: , Rfl: ;  aspirin EC 81 MG tablet, Take 81 mg by mouth daily., Disp: , Rfl: ;  atorvastatin (LIPITOR) 20 MG tablet, Take 20 mg by mouth daily.  , Disp: , Rfl: ;  COLCRYS 0.6 MG tablet, Take 0.6 mg by mouth 2 (two) times daily  as needed. For gout, Disp: , Rfl:  cyclobenzaprine (FLEXERIL) 10 MG tablet, Take 10 mg by mouth at bedtime as needed. For muscle spasms, Disp: , Rfl: ;  eprosartan-hydrochlorothiazide (TEVETEN HCT) 600-12.5 MG per tablet, Take 1 tablet by mouth daily. , Disp: , Rfl: ;  Ergocalciferol (VITAMIN D2) 2000 UNITS TABS, Take 1 tablet by mouth daily., Disp: , Rfl: ;  HYDROcodone-acetaminophen (LORCET) 10-650 MG per tablet, , Disp: , Rfl:  JANUVIA 100 MG tablet, Take 100 mg by mouth daily. , Disp: , Rfl: ;  lidocaine (LIDODERM) 5 %, Place 1 patch onto the skin daily. Remove & Discard patch within 12 hours or as directed by MD, Disp: , Rfl: ;  NEXIUM 40 MG capsule, Take 40 mg by mouth daily before breakfast. , Disp: , Rfl: ;  NIASPAN 500 MG CR tablet, , Disp: , Rfl: ;  oxyCODONE-acetaminophen (PERCOCET) 10-325 MG per tablet, , Disp: , Rfl:   BP 136/62  Pulse 93  Ht 5\' 2"  (1.575 m)  Wt 240 lb (108.863 kg)  BMI 43.89 kg/m2  SpO2 89%  Body mass index is 43.89 kg/(m^2).           Review of Systems complains of varicose  veins, swelling in legs, productive cough, weakness and left arm, numbness in hands and feet    Objective:   Physical Exam blood pressure 136/62 heart rate 93 respirations 16 General morbidly obese female in no apparent distress alert and oriented x3 Lungs no rhonchi or wheezing Abdomen obese no palpable masses  2+ femoral pulses  palpable bilaterally  Today I ordered a CT angiogram of the abdomen and pelvis which I reviewed a computer. There is no evidence of endoleak. Aneurysm sac as continued to shrink down around the graft now measuring 4.2 cm in maximum diameter.      Assessment:     nicely functioning aortic stent graft for abdominal aortic aneurysm-urgent repair 2 years ago     aortic stent graft aortic stent graftaReturn in one year with CT angiogram of abdomen and pelvis for continued followup of aortic stent graft aortic stent graftn:    return in one year with CT  angiogram of abdomen and pelvis for continued followup of aortic stent graft

## 2012-10-18 NOTE — Addendum Note (Signed)
Addended by: Adria Dill L on: 10/18/2012 04:44 PM   Modules accepted: Orders

## 2012-10-27 ENCOUNTER — Telehealth: Payer: Self-pay | Admitting: *Deleted

## 2012-10-27 NOTE — Telephone Encounter (Signed)
CALLED PATIENT TO ASK QUESTION, LVM FOR A RETURN CALL 

## 2012-10-31 ENCOUNTER — Other Ambulatory Visit (HOSPITAL_COMMUNITY): Payer: Medicare Other

## 2012-11-02 ENCOUNTER — Other Ambulatory Visit: Payer: Self-pay | Admitting: Radiation Oncology

## 2012-11-03 ENCOUNTER — Ambulatory Visit (HOSPITAL_COMMUNITY): Payer: Medicare Other

## 2012-11-03 ENCOUNTER — Ambulatory Visit: Payer: Medicare Other | Admitting: Radiation Oncology

## 2012-11-04 ENCOUNTER — Ambulatory Visit
Admission: RE | Admit: 2012-11-04 | Discharge: 2012-11-04 | Disposition: A | Discharge status: Home / Self Care | Payer: Medicare Other | Source: Ambulatory Visit | Attending: Radiation Oncology | Admitting: Radiation Oncology

## 2012-11-04 ENCOUNTER — Ambulatory Visit (HOSPITAL_COMMUNITY)
Admission: RE | Admit: 2012-11-04 | Discharge: 2012-11-04 | Disposition: A | Discharge status: Home / Self Care | Payer: Medicare Other | Source: Ambulatory Visit | Attending: Radiation Oncology | Admitting: Radiation Oncology

## 2012-11-04 ENCOUNTER — Encounter: Payer: Self-pay | Admitting: Radiation Oncology

## 2012-11-04 DIAGNOSIS — I2510 Atherosclerotic heart disease of native coronary artery without angina pectoris: Secondary | ICD-10-CM | POA: Insufficient documentation

## 2012-11-04 DIAGNOSIS — R0602 Shortness of breath: Secondary | ICD-10-CM | POA: Insufficient documentation

## 2012-11-04 DIAGNOSIS — I517 Cardiomegaly: Secondary | ICD-10-CM | POA: Insufficient documentation

## 2012-11-04 DIAGNOSIS — C3490 Malignant neoplasm of unspecified part of unspecified bronchus or lung: Secondary | ICD-10-CM | POA: Insufficient documentation

## 2012-11-04 DIAGNOSIS — D3500 Benign neoplasm of unspecified adrenal gland: Secondary | ICD-10-CM | POA: Insufficient documentation

## 2012-11-04 DIAGNOSIS — M619 Calcification and ossification of muscle, unspecified: Secondary | ICD-10-CM | POA: Insufficient documentation

## 2012-11-04 DIAGNOSIS — Z9221 Personal history of antineoplastic chemotherapy: Secondary | ICD-10-CM | POA: Insufficient documentation

## 2012-11-04 DIAGNOSIS — K7689 Other specified diseases of liver: Secondary | ICD-10-CM | POA: Insufficient documentation

## 2012-11-04 DIAGNOSIS — I700 Atherosclerosis of aorta: Secondary | ICD-10-CM | POA: Insufficient documentation

## 2012-11-04 DIAGNOSIS — R0989 Other specified symptoms and signs involving the circulatory and respiratory systems: Secondary | ICD-10-CM | POA: Insufficient documentation

## 2012-11-04 DIAGNOSIS — I7411 Embolism and thrombosis of thoracic aorta: Secondary | ICD-10-CM | POA: Insufficient documentation

## 2012-11-04 DIAGNOSIS — R0609 Other forms of dyspnea: Secondary | ICD-10-CM | POA: Insufficient documentation

## 2012-11-04 DIAGNOSIS — J9819 Other pulmonary collapse: Secondary | ICD-10-CM | POA: Insufficient documentation

## 2012-11-04 MED ORDER — IOHEXOL 300 MG/ML  SOLN
100.0000 mL | Freq: Once | INTRAMUSCULAR | Status: AC | PRN
  Administered 2012-11-04: 100 mL via INTRAVENOUS

## 2012-11-10 ENCOUNTER — Ambulatory Visit
Admission: RE | Admit: 2012-11-10 | Discharge: 2012-11-10 | Disposition: A | Discharge status: Home / Self Care | Payer: Medicare Other | Source: Ambulatory Visit | Attending: Radiation Oncology | Admitting: Radiation Oncology

## 2012-11-10 ENCOUNTER — Encounter: Payer: Self-pay | Admitting: Radiation Oncology

## 2012-11-10 ENCOUNTER — Ambulatory Visit: Payer: Medicare Other | Admitting: Radiation Oncology

## 2012-11-10 ENCOUNTER — Telehealth: Payer: Self-pay | Admitting: *Deleted

## 2012-11-10 HISTORY — DX: Personal history of irradiation: Z92.3

## 2012-11-10 NOTE — Progress Notes (Signed)
   Department of Radiation Oncology  Phone:  [phone] Fax:        [phone]   Name: Beverly Lewis MRN: [id number]  DOB: [birth year]  Date: 11/10/2012  Follow Up Visit Note  Diagnosis: T2N0 Adenocarcinoma of the right upper lobe  Summary and Interval since last radiation: 8 months (SBRT to 50 Gy in 5 fractions completed 03/03/12)  Interval History: Beverly Lewis presents today for routine followup.  She continues to struggle with arthritis and neuropathy. Her breathing symptoms are stable. She had a repeat BX CT scan of the chest performed on 11/04/2012. This showed stability of the nodular opacity in the right upper lobe with adjacent scarring and atelectasis. No progressive disease in the mediastinum or other lung nodules were noted. Her CT angiogram and April was also good. She requests a refill for Lortab which he uses for her generalized muscle aches and arthritis. She is followed by Dr.Deveshwar for this. She is unaccompanied today.  Allergies:  Allergies  Allergen Reactions  . Asa Buff (Mag (Buffered Aspirin)     In tolerance in higher dosages.    Medications:  Current Outpatient Prescriptions  Medication Sig Dispense Refill  . amLODipine (NORVASC) 10 MG tablet Take 10 mg by mouth daily.        Marland Kitchen aspirin EC 81 MG tablet Take 81 mg by mouth daily.      Marland Kitchen atorvastatin (LIPITOR) 20 MG tablet Take 20 mg by mouth daily.        . cyclobenzaprine (FLEXERIL) 10 MG tablet Take 10 mg by mouth at bedtime as needed. For muscle spasms      . eprosartan-hydrochlorothiazide (TEVETEN HCT) 600-12.5 MG per tablet Take 1 tablet by mouth daily.       . Ergocalciferol (VITAMIN D2) 2000 UNITS TABS Take 1 tablet by mouth daily.      Marland Kitchen JANUVIA 100 MG tablet Take 100 mg by mouth daily.       Marland Kitchen lidocaine (LIDODERM) 5 % Place 1 patch onto the skin daily. Remove & Discard patch within 12 hours or as directed by MD      . NEXIUM 40 MG capsule Take 40 mg by mouth daily before breakfast.       .  NIASPAN 500 MG CR tablet       . allopurinol (ZYLOPRIM) 100 MG tablet Take 100 mg by mouth daily.       Marland Kitchen COLCRYS 0.6 MG tablet Take 0.6 mg by mouth 2 (two) times daily as needed. For gout      . HYDROcodone-acetaminophen (LORCET) 10-650 MG per tablet Take 1 tablet by mouth every 8 (eight) hours as needed for pain.        No current facility-administered medications for this encounter.    Physical Exam:  Filed Vitals:   11/10/12 1259  BP: 139/68  Pulse: 87  Temp: 97.6 F (36.4 C)  Resp: 20   she is a pleasant female who is sitting in a wheelchair. She is in no distress. She is not wearing oxygen.  IMPRESSION: Beverly Lewis is a 79 y.o. female status post SBRT with no evidence of disease  PLAN:  Her skin looks good. I have not refilled her Lortab. I asked her to followup with her primary physician or her rheumatologist regarding this. I will plan on repeating a scan in 6 months.    Beverly Hare, MD

## 2012-11-10 NOTE — Progress Notes (Signed)
Patient here f/u s/p rad tx right upper lobe lung, :02/18/12; 02/23/12; 02/25/12; 03/01/12; 03/03/12 52 GY/53fx pateint  in w/c, alert,oriented x3, room air sats=100% room air, vitals wnl, coughs up clear pheglm, allergies bothring her right now, declined to be weighed, feet ar numb up to her ankles, , has diabetes, "feels like they are asleep all the time " generalized pain all over, requesting lorcet 10/650mg  pain med refill fom MD,ran out, appetite good, fatigued, diarrhea yesterday resolved now CT chest 11/04/12 results in 1:08 PM  ;

## 2012-11-10 NOTE — Telephone Encounter (Signed)
CALLED PATIENT TO INFORM OF TEST AND FU, SPOKE WITH PATIENT AND SHE IS AWARE OF THESE APPTS. 

## 2013-05-11 ENCOUNTER — Ambulatory Visit: Payer: Medicare Other | Admitting: Radiation Oncology

## 2013-05-15 ENCOUNTER — Other Ambulatory Visit (HOSPITAL_COMMUNITY): Payer: Medicare Other

## 2013-05-25 ENCOUNTER — Ambulatory Visit (HOSPITAL_COMMUNITY)
Admission: RE | Admit: 2013-05-25 | Discharge: 2013-05-25 | Disposition: A | Discharge status: Home / Self Care | Payer: Medicare Other | Source: Ambulatory Visit | Attending: Radiation Oncology | Admitting: Radiation Oncology

## 2013-05-25 ENCOUNTER — Encounter (HOSPITAL_COMMUNITY): Payer: Self-pay

## 2013-05-25 ENCOUNTER — Ambulatory Visit
Admission: RE | Admit: 2013-05-25 | Discharge: 2013-05-25 | Disposition: A | Discharge status: Home / Self Care | Payer: Medicare Other | Source: Ambulatory Visit | Attending: Radiation Oncology | Admitting: Radiation Oncology

## 2013-05-25 ENCOUNTER — Ambulatory Visit: Payer: Medicare Other | Admitting: Radiation Oncology

## 2013-05-25 DIAGNOSIS — C3490 Malignant neoplasm of unspecified part of unspecified bronchus or lung: Secondary | ICD-10-CM | POA: Insufficient documentation

## 2013-05-25 DIAGNOSIS — Z9221 Personal history of antineoplastic chemotherapy: Secondary | ICD-10-CM | POA: Insufficient documentation

## 2013-05-25 DIAGNOSIS — I700 Atherosclerosis of aorta: Secondary | ICD-10-CM | POA: Insufficient documentation

## 2013-05-25 LAB — BUN AND CREATININE (CC13)
BUN: 23.5 mg/dL (ref 7.0–26.0)
Creatinine: 1.1 mg/dL (ref 0.6–1.1)

## 2013-05-25 MED ORDER — IOHEXOL 300 MG/ML  SOLN
80.0000 mL | Freq: Once | INTRAMUSCULAR | Status: AC | PRN
  Administered 2013-05-25: 80 mL via INTRAVENOUS

## 2013-06-01 ENCOUNTER — Encounter: Payer: Self-pay | Admitting: Radiation Oncology

## 2013-06-01 ENCOUNTER — Ambulatory Visit
Admission: RE | Admit: 2013-06-01 | Discharge: 2013-06-01 | Disposition: A | Discharge status: Home / Self Care | Payer: Medicare Other | Source: Ambulatory Visit | Attending: Radiation Oncology | Admitting: Radiation Oncology

## 2013-06-01 NOTE — Progress Notes (Addendum)
Beverly Lewis is here today for a FU s/p radiation to her RUL which completed 03/03/12.  She denies any pain presently, but reports a weak, tired feeling in her legs at night and sleeps mostly in her lift chair.  She reports  SOB and fatigue when she is ambulating.  O2 Sat 100% on RA while sitting.  Traveling by wheelchair today.

## 2013-06-02 NOTE — Progress Notes (Signed)
   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  She spends most of her time in a chair in her home. She is weak. She gets up to a lift chair to sleep. She has stable shortness of breath. She is not on home oxygen. She notes no hemoptysis chest pain or headaches. She had a CT of the chest on 05/25/2013. This showed evolution of the postradiation change with an area of scarring measuring 4.2 x 3.3 cm. Comparison and close clinical followup versus PET scan was recommended. She is present with her son today.  Allergies:  Allergies  Allergen Reactions  . Asa Buff (Mag [Buffered Aspirin]     In tolerance in higher dosages.    Medications:  Current Outpatient Prescriptions  Medication Sig Dispense Refill  . allopurinol (ZYLOPRIM) 100 MG tablet Take 100 mg by mouth daily.       Marland Kitchen amLODipine (NORVASC) 10 MG tablet Take 10 mg by mouth daily.        Marland Kitchen aspirin EC 81 MG tablet Take 81 mg by mouth daily.      Marland Kitchen atorvastatin (LIPITOR) 20 MG tablet Take 20 mg by mouth daily.        Marland Kitchen COLCRYS 0.6 MG tablet Take 0.6 mg by mouth 2 (two) times daily as needed. For gout      . eprosartan-hydrochlorothiazide (TEVETEN HCT) 600-12.5 MG per tablet Take 1 tablet by mouth daily.       . Ergocalciferol (VITAMIN D2) 2000 UNITS TABS Take 1 tablet by mouth daily.      Marland Kitchen HYDROcodone-acetaminophen (LORCET) 10-650 MG per tablet Take 1 tablet by mouth every 8 (eight) hours as needed for pain.       Marland Kitchen JANUVIA 100 MG tablet Take 100 mg by mouth daily.       Marland Kitchen NEXIUM 40 MG capsule Take 40 mg by mouth daily before breakfast.       . NIASPAN 500 MG CR tablet         No current facility-administered medications for this encounter.    Physical Exam:  Filed Vitals:   06/01/13 1349  BP: 143/88  Pulse: 78  Temp: 98.3 F (36.8 C)   she is a pleasant female who is sitting in a wheelchair. She is in no distress. She is not wearing oxygen.  IMPRESSION: Beverly Lewis is a 79 y.o. female status post SBRT with no evidence of disease and likely progressive scarring on recent CT scan  PLAN: I will see her back in 3 months with a repeat scan. She will call me with any questions.    Lurline Hare, MD

## 2013-08-31 ENCOUNTER — Ambulatory Visit: Payer: Self-pay | Admitting: Radiation Oncology

## 2013-08-31 ENCOUNTER — Ambulatory Visit (HOSPITAL_COMMUNITY)
Admission: RE | Admit: 2013-08-31 | Discharge: 2013-08-31 | Disposition: A | Discharge status: Home / Self Care | Payer: Medicare Other | Source: Ambulatory Visit | Attending: Radiation Oncology | Admitting: Radiation Oncology

## 2013-08-31 ENCOUNTER — Encounter (HOSPITAL_COMMUNITY): Payer: Self-pay

## 2013-08-31 DIAGNOSIS — C3490 Malignant neoplasm of unspecified part of unspecified bronchus or lung: Secondary | ICD-10-CM | POA: Insufficient documentation

## 2013-08-31 DIAGNOSIS — D3500 Benign neoplasm of unspecified adrenal gland: Secondary | ICD-10-CM | POA: Insufficient documentation

## 2013-08-31 DIAGNOSIS — K7689 Other specified diseases of liver: Secondary | ICD-10-CM | POA: Insufficient documentation

## 2013-09-07 ENCOUNTER — Ambulatory Visit
Admission: RE | Admit: 2013-09-07 | Discharge: 2013-09-07 | Disposition: A | Discharge status: Home / Self Care | Payer: Medicare Other | Source: Ambulatory Visit | Attending: Radiation Oncology | Admitting: Radiation Oncology

## 2013-09-07 ENCOUNTER — Telehealth: Payer: Self-pay | Admitting: *Deleted

## 2013-09-07 VITALS — BP 128/69 | HR 88 | Temp 98.6°F | Resp 18

## 2013-09-07 DIAGNOSIS — C3490 Malignant neoplasm of unspecified part of unspecified bronchus or lung: Secondary | ICD-10-CM

## 2013-09-07 NOTE — Progress Notes (Signed)
Patient here for routine follow up.Has right great toe pain which she reports in grown toenail.Shortness of breath on exertion, clear productive cough.to review ct chest report .Has enlarging right upper lobe lung mass.

## 2013-09-07 NOTE — Progress Notes (Signed)
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  She presents with her son. She denies any hemoptysis. She has occasional headaches when she gets upset. For this she takes a blood pressure pill. She is on home oxygen. Her breathing symptoms are stable. She still uses a lift chair to sleep. She reports continued occasional chest pain in the area where she had the lung biopsy almost 2 years ago. She had a CT of the chest on 08/31/2013 which unfortunately showed enlargement of the right upper lobe mass now measuring 5.6 x 3.4 cm up from 4.2 x 3.3 cm. Her mediastinal lymph nodes were stable in size. There is no progressive disease in the left lung. No evidence of metastatic disease.  Allergies:  Allergies  Allergen Reactions  . Asa Buff (Mag [Buffered Aspirin]     In tolerance in higher dosages.    Medications:  Current Outpatient Prescriptions  Medication Sig Dispense Refill  . allopurinol (ZYLOPRIM) 100 MG tablet Take 100 mg by mouth daily.       Marland Kitchen amLODipine (NORVASC) 10 MG tablet Take 10 mg by mouth daily.        Marland Kitchen aspirin EC 81 MG tablet Take 81 mg by mouth daily.      Marland Kitchen atorvastatin (LIPITOR) 20 MG tablet Take 20 mg by mouth daily.        Marland Kitchen COLCRYS 0.6 MG tablet Take 0.6 mg by mouth 2 (two) times daily as needed. For gout      . eprosartan-hydrochlorothiazide (TEVETEN HCT) 600-12.5 MG per tablet Take 1 tablet by mouth daily.       . Ergocalciferol (VITAMIN D2) 2000 UNITS TABS Take 1 tablet by mouth daily.      Marland Kitchen JANUVIA 100 MG tablet Take 100 mg by mouth daily.       Marland Kitchen NEXIUM 40 MG capsule Take 40 mg by mouth daily before breakfast.       . NIASPAN 500 MG CR tablet         . HYDROcodone-acetaminophen (LORCET) 10-650 MG per tablet Take 1 tablet by mouth every 8 (eight) hours as needed for pain.        No current facility-administered medications for this encounter.    Physical Exam:  Filed Vitals:   09/07/13 0909  BP: 128/69  Pulse: 88  Temp: 98.6 F (37 C)  Resp: 18  SpO2: 100%   pleasant obese female sitting in a wheelchair.  IMPRESSION: Beverly Lewis is a 80 y.o. female status post SBRT to a right upper lobe lesion with progressive radiation change versus recurrent disease  PLAN:  I spoke with her about her scan. I really feel like this is scar tissue. She is not interested in pursuing a biopsy at this time. She is not interested in surgical referral or reirradiation do to the possible side effects of impact on her quality of life. For this reason I have offered her a PET scan in 3 months. If this truly is progressive disease we will know at that time. If it is just scar tissue the PET scan should further delineate that as well. We discussed again the options for treatment of her current disease  which will include surgery versus reirradiation. She is not interested in either of these at this time. I explained this to her and her son. I will plan on seeing her back in 3 months after her PET scan.    Thea Silversmith, MD

## 2013-09-07 NOTE — Telephone Encounter (Signed)
Called patient to inform of test and fu, spoke with patient and she is aware of these appts.

## 2013-09-15 ENCOUNTER — Other Ambulatory Visit: Payer: Self-pay | Admitting: Orthopedic Surgery

## 2013-09-15 ENCOUNTER — Ambulatory Visit
Admission: RE | Admit: 2013-09-15 | Discharge: 2013-09-15 | Disposition: A | Discharge status: Home / Self Care | Payer: Medicare Other | Source: Ambulatory Visit | Attending: Orthopedic Surgery | Admitting: Orthopedic Surgery

## 2013-09-15 DIAGNOSIS — M1990 Unspecified osteoarthritis, unspecified site: Secondary | ICD-10-CM

## 2013-09-15 DIAGNOSIS — IMO0002 Reserved for concepts with insufficient information to code with codable children: Secondary | ICD-10-CM

## 2013-10-23 ENCOUNTER — Other Ambulatory Visit: Payer: Self-pay | Admitting: Vascular Surgery

## 2013-10-23 LAB — CREATININE, SERUM: Creat: 1.1 mg/dL (ref 0.50–1.10)

## 2013-10-23 LAB — BUN: BUN: 24 mg/dL — ABNORMAL HIGH (ref 6–23)

## 2013-10-24 ENCOUNTER — Ambulatory Visit
Admission: RE | Admit: 2013-10-24 | Discharge: 2013-10-24 | Disposition: A | Discharge status: Home / Self Care | Payer: Medicare Other | Source: Ambulatory Visit | Attending: Vascular Surgery | Admitting: Vascular Surgery

## 2013-10-24 ENCOUNTER — Ambulatory Visit: Payer: Medicare Other | Admitting: Vascular Surgery

## 2013-10-24 DIAGNOSIS — I714 Abdominal aortic aneurysm, without rupture, unspecified: Secondary | ICD-10-CM

## 2013-10-24 DIAGNOSIS — Z48812 Encounter for surgical aftercare following surgery on the circulatory system: Secondary | ICD-10-CM

## 2013-10-24 MED ORDER — IOHEXOL 350 MG/ML SOLN
80.0000 mL | Freq: Once | INTRAVENOUS | Status: AC | PRN
  Administered 2013-10-24: 80 mL via INTRAVENOUS

## 2013-11-06 ENCOUNTER — Encounter: Payer: Self-pay | Admitting: Vascular Surgery

## 2013-11-07 ENCOUNTER — Ambulatory Visit: Payer: Medicare Other | Admitting: Vascular Surgery

## 2013-11-10 ENCOUNTER — Encounter: Payer: Self-pay | Admitting: Vascular Surgery

## 2013-11-14 ENCOUNTER — Encounter: Payer: Self-pay | Admitting: Vascular Surgery

## 2013-11-14 ENCOUNTER — Ambulatory Visit (INDEPENDENT_AMBULATORY_CARE_PROVIDER_SITE_OTHER): Payer: Medicare Other | Admitting: Vascular Surgery

## 2013-11-14 VITALS — BP 134/69 | HR 82 | Ht 62.0 in | Wt 240.0 lb

## 2013-11-14 DIAGNOSIS — I714 Abdominal aortic aneurysm, without rupture, unspecified: Secondary | ICD-10-CM

## 2013-11-14 DIAGNOSIS — Z48812 Encounter for surgical aftercare following surgery on the circulatory system: Secondary | ICD-10-CM

## 2013-11-14 NOTE — Addendum Note (Signed)
Addended by: Mena Goes on: 11/14/2013 11:03 AM   Modules accepted: Orders

## 2013-11-14 NOTE — Progress Notes (Signed)
Subjective:     Patient ID: Beverly Lewis, female   DOB: 1928/11/07, 80 y.o.   MRN: [id number]  HPI this 80 year old female now returns 3 years post emergent insertion of aortic stent graft for suspected leakage and symptomatic abdominal aortic aneurysm. She has done very well from that standpoint. She is not very mobile because of chronic back issues having previously had surgery 15 years ago. She ambulates quite slowly with a walker at times. She denies any rest pain in her lower extremities but does have chronic numbness in both feet. She does have dyspnea on exertion no chest discomfort. She states her blood pressure has been controlled fairly well.  Past Medical History  Diagnosis Date  . Hypertension   . Hyperlipidemia   . Cerebrovascular accident history of mild cerebrovascular accident which caused some visual disturbance.   . Diabetes mellitus   . Morbid obesity   . Lung mass     Right upper Lobe  . Shortness of breath   . Pneumonia     hx  . Peripheral vascular disease     hx blood clot 30 yrs ago leg  . GERD (gastroesophageal reflux disease)   . Arthritis   . Pneumothorax, iatrogenic 8/2/113-discharge note    Post Biospy  . Cough with hemoptysis 01/22/12    Prior to discharge  . Tachycardia 01/22/12    Prior to discharge  . History of radiation therapy 02/18/12,02/23/12,02/25/12., 03/01/12,&03/03/12    RULlung 50Gy/5/fx  . Lung cancer 01/21/12    History  Substance Use Topics  . Smoking status: Current Every Day Smoker -- 0.50 packs/day for 65 years    Types: Cigarettes  . Smokeless tobacco: Never Used  . Alcohol Use: No     Comment: She does not consume alcohol on a regular basis.    Family History  Problem Relation Age of Onset  . Heart disease Mother   . Hypertension Mother   . Diabetes Mother   . Hyperlipidemia Mother   . Heart disease Father   . Hypertension Father   . Diabetes Father   . Hyperlipidemia Father   . Diabetes Daughter     Allergies   Allergen Reactions  . Asa Buff (Mag [Buffered Aspirin]     In tolerance in higher dosages.    Current outpatient prescriptions:allopurinol (ZYLOPRIM) 100 MG tablet, Take 100 mg by mouth daily. , Disp: , Rfl: ;  amLODipine (NORVASC) 10 MG tablet, Take 10 mg by mouth daily.  , Disp: , Rfl: ;  aspirin EC 81 MG tablet, Take 81 mg by mouth daily., Disp: , Rfl: ;  atorvastatin (LIPITOR) 20 MG tablet, Take 20 mg by mouth daily.  , Disp: , Rfl: ;  COLCRYS 0.6 MG tablet, Take 0.6 mg by mouth 2 (two) times daily as needed. For gout, Disp: , Rfl:  eprosartan-hydrochlorothiazide (TEVETEN HCT) 600-12.5 MG per tablet, Take 1 tablet by mouth daily. , Disp: , Rfl: ;  Ergocalciferol (VITAMIN D2) 2000 UNITS TABS, Take 1 tablet by mouth daily., Disp: , Rfl: ;  HYDROcodone-acetaminophen (LORCET) 10-650 MG per tablet, Take 1 tablet by mouth every 8 (eight) hours as needed for pain. , Disp: , Rfl: ;  JANUVIA 100 MG tablet, Take 100 mg by mouth daily. , Disp: , Rfl:  NEXIUM 40 MG capsule, Take 40 mg by mouth daily before breakfast. , Disp: , Rfl: ;  NIASPAN 500 MG CR tablet, , Disp: , Rfl:   BP 134/69  Pulse 82  Ht 5'  2" (1.575 m)  Wt 240 lb (108.863 kg)  BMI 43.89 kg/m2  SpO2 92%  Body mass index is 43.89 kg/(m^2).           Review of Systems complains of dyspnea on exertion, leg pain with walking, numbness and feet, pain in legs at rest on occasion. Also has weakness in arms and legs. Chronic back discomfort is also present. Other systems negative and complete review of system    Objective:   Physical Exam BP 134/69  Pulse 82  Ht 5\' 2"  (1.575 m)  Wt 240 lb (108.863 kg)  BMI 43.89 kg/m2  SpO2 92%  Gen.-alert and oriented x3 in no apparent distress-morbidly obese-in wheelchair  HEENT normal for age Lungs no rhonchi or wheezing Cardiovascular regular rhythm no murmurs carotid pulses 3+ palpable no bruits audible Abdomen soft nontender no palpable masses-obese Musculoskeletal free of  major  deformities Skin clear -no rashes Neurologic normal Lower extremities patient with large panniculus and large lower extremities. Femoral pulses but difficult to palpate-both feet adequately perfused with slight decreased sensation-symmetrical  Today I ordered a CT angiogram of the abdomen and pelvis which I reviewed the computer. Patient does have continued shrinkage of the abdominal aortic aneurysm to 3.9 cm at the present time. There is no evidence of endoleak. 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 She has significant stenosis of the right renal artery which is heavily calcified at the ostium. She has bilateral iliac occlusive disease.        Assessment:     3 years post urgent aortic stent grafting of symptomatic abdominal aortic aneurysm. 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

## 2013-11-30 ENCOUNTER — Ambulatory Visit: Payer: Medicare Other | Admitting: Radiation Oncology

## 2013-12-08 ENCOUNTER — Ambulatory Visit (HOSPITAL_COMMUNITY)
Admission: RE | Admit: 2013-12-08 | Discharge: 2013-12-08 | Disposition: A | Discharge status: Home / Self Care | Payer: Medicare Other | Source: Ambulatory Visit | Attending: Radiation Oncology | Admitting: Radiation Oncology

## 2013-12-08 ENCOUNTER — Encounter (HOSPITAL_COMMUNITY): Payer: Self-pay

## 2013-12-08 DIAGNOSIS — C3490 Malignant neoplasm of unspecified part of unspecified bronchus or lung: Secondary | ICD-10-CM

## 2013-12-08 DIAGNOSIS — C3410 Malignant neoplasm of upper lobe, unspecified bronchus or lung: Secondary | ICD-10-CM | POA: Insufficient documentation

## 2013-12-08 LAB — GLUCOSE, CAPILLARY: Glucose-Capillary: 112 mg/dL — ABNORMAL HIGH (ref 70–99)

## 2013-12-08 MED ORDER — FLUDEOXYGLUCOSE F - 18 (FDG) INJECTION
11.8000 | Freq: Once | INTRAVENOUS | Status: AC | PRN
Start: 2013-12-08 — End: 2013-12-08
  Administered 2013-12-08: 11.8 via INTRAVENOUS

## 2013-12-14 ENCOUNTER — Ambulatory Visit
Admission: RE | Admit: 2013-12-14 | Discharge: 2013-12-14 | Disposition: A | Discharge status: Home / Self Care | Payer: Medicare Other | Source: Ambulatory Visit | Attending: Radiation Oncology | Admitting: Radiation Oncology

## 2013-12-14 ENCOUNTER — Ambulatory Visit: Payer: Medicare Other | Admitting: Radiation Oncology

## 2013-12-14 VITALS — BP 125/81 | HR 87 | Temp 98.3°F | Resp 20

## 2013-12-14 DIAGNOSIS — C3411 Malignant neoplasm of upper lobe, right bronchus or lung: Secondary | ICD-10-CM

## 2013-12-14 NOTE — Progress Notes (Signed)
Patient for routine follow up of radiation to right upper lobe adenocarcinoma of lung.Denies pain.Has shortness of breath on exertion.To review PET results from 12/08/13.

## 2013-12-15 ENCOUNTER — Telehealth: Payer: Self-pay | Admitting: Internal Medicine

## 2013-12-15 NOTE — Progress Notes (Signed)
Department of Radiation Oncology  Phone:  [phone] Fax:        [phone]   Name: Beverly Lewis MRN: [id number]  DOB: December 30, 1928  Date: 12/14/2013  Follow Up Visit Note  Diagnosis: T2N0 Adenocarcinoma of the right upper lobe  Summary and Interval since last radiation: 2 years  Interval History: Beverly Lewis presents today for routine followup.  She presents with her son. She denies any hemoptysis. She is on home oxygen. Her breathing symptoms are stable. She still uses a lift chair to sleep. She still reports occasional chest pain in the area where she had the lung biopsy almost 2 years ago. She barely walks but does perform most of her activities of daily living. She still smokes a few cigarettes. She denies any pain currently and denies headache. She had a good report on her recent visit with her vascular surgeon. She had a PET scan on 6/19 to follow up on the changes in the right upper lobe. Unfortunately that area has gotten larger and has an SUV of 10.6. A right paratracheal node has low level activity. No metastatic disease was noted.    Allergies:  Allergies  Allergen Reactions  . Asa Buff (Mag [Buffered Aspirin]     In tolerance in higher dosages.    Medications:  Current Outpatient Prescriptions  Medication Sig Dispense Refill  . allopurinol (ZYLOPRIM) 100 MG tablet Take 100 mg by mouth daily.       Marland Kitchen amLODipine (NORVASC) 10 MG tablet Take 10 mg by mouth daily.        Marland Kitchen aspirin EC 81 MG tablet Take 81 mg by mouth daily.      Marland Kitchen atorvastatin (LIPITOR) 20 MG tablet Take 20 mg by mouth daily.        Marland Kitchen COLCRYS 0.6 MG tablet Take 0.6 mg by mouth 2 (two) times daily as needed. For gout      . eprosartan-hydrochlorothiazide (TEVETEN HCT) 600-12.5 MG per tablet Take 1 tablet by mouth daily.       . Ergocalciferol (VITAMIN D2) 2000 UNITS TABS Take 1 tablet by mouth daily.      Marland Kitchen HYDROcodone-acetaminophen (LORCET) 10-650 MG per tablet Take 1 tablet by mouth every 8  (eight) hours as needed for pain.       Marland Kitchen JANUVIA 100 MG tablet Take 100 mg by mouth daily.       Marland Kitchen NEXIUM 40 MG capsule Take 40 mg by mouth daily before breakfast.       . NIASPAN 500 MG CR tablet        No current facility-administered medications for this encounter.    Physical Exam:  Filed Vitals:   12/14/13 0900  BP: 125/81  Pulse: 87  Temp: 98.3 F (36.8 C)  Resp: 20  SpO2: 100%   pleasant obese female sitting in a wheelchair.  IMPRESSION: Beverly Lewis is a 80 y.o. female status post SBRT to a right upper lobe lesion with progressive/recurrent disease  PLAN:  I spoke with her about her scan. The PET scan is pretty clear that this is progressive disease.  She really does not want to have another biopsy given the occasional chest pain she has now at the biopsy site. She would not consider surgery but she would like hear about what her chemo options may be if she did pursue a biopsy. I have therefore referred her to Dr. Julien Nordmann to discuss the possibilities. Unfortunately she continues to smoke "a little bit" so she is likely not  a EGFR mutation carrier and will requrie some form of standard chemotherapy which I discussed with her and her grandson.  I will see her back in a month.     Thea Silversmith, MD

## 2013-12-15 NOTE — Telephone Encounter (Signed)
S/W PATIENT AND GAVE NEW D/T ON 07/13 @ 11 W/DR. MOHAMED.

## 2013-12-15 NOTE — Telephone Encounter (Signed)
S/W PATIENT GRANDSON([name]) AND GAVE NP APPT FOR 07/08 @ 11 W/DR. MOHAMED.  DX- MALIGNANT NEOPLASM OF UPPER LOBE OF RIGHT LUNG

## 2013-12-21 ENCOUNTER — Ambulatory Visit: Payer: Medicare Other | Admitting: Radiation Oncology

## 2013-12-25 ENCOUNTER — Other Ambulatory Visit: Payer: Medicare Other

## 2013-12-25 ENCOUNTER — Ambulatory Visit: Payer: Medicare Other

## 2013-12-25 ENCOUNTER — Ambulatory Visit: Payer: Medicare Other | Admitting: Internal Medicine

## 2013-12-27 ENCOUNTER — Other Ambulatory Visit: Payer: Medicare Other

## 2013-12-27 ENCOUNTER — Ambulatory Visit: Payer: Medicare Other | Admitting: Internal Medicine

## 2013-12-27 ENCOUNTER — Ambulatory Visit: Payer: Medicare Other

## 2013-12-29 ENCOUNTER — Other Ambulatory Visit: Payer: Self-pay | Admitting: *Deleted

## 2013-12-29 DIAGNOSIS — R918 Other nonspecific abnormal finding of lung field: Secondary | ICD-10-CM

## 2014-01-01 ENCOUNTER — Encounter: Payer: Self-pay | Admitting: Internal Medicine

## 2014-01-01 ENCOUNTER — Ambulatory Visit: Payer: Medicare Other

## 2014-01-01 ENCOUNTER — Telehealth: Payer: Self-pay | Admitting: Internal Medicine

## 2014-01-01 ENCOUNTER — Ambulatory Visit (HOSPITAL_BASED_OUTPATIENT_CLINIC_OR_DEPARTMENT_OTHER): Payer: Medicare Other | Admitting: Internal Medicine

## 2014-01-01 ENCOUNTER — Other Ambulatory Visit (HOSPITAL_BASED_OUTPATIENT_CLINIC_OR_DEPARTMENT_OTHER): Payer: Medicare Other

## 2014-01-01 VITALS — BP 106/70 | HR 82 | Temp 98.3°F | Resp 19 | Ht 62.0 in

## 2014-01-01 DIAGNOSIS — C3410 Malignant neoplasm of upper lobe, unspecified bronchus or lung: Secondary | ICD-10-CM

## 2014-01-01 DIAGNOSIS — F17200 Nicotine dependence, unspecified, uncomplicated: Secondary | ICD-10-CM

## 2014-01-01 DIAGNOSIS — C3411 Malignant neoplasm of upper lobe, right bronchus or lung: Secondary | ICD-10-CM

## 2014-01-01 DIAGNOSIS — R918 Other nonspecific abnormal finding of lung field: Secondary | ICD-10-CM

## 2014-01-01 LAB — COMPREHENSIVE METABOLIC PANEL (CC13)
ALT: 15 U/L (ref 0–55)
ANION GAP: 8 meq/L (ref 3–11)
AST: 14 U/L (ref 5–34)
Albumin: 3.4 g/dL — ABNORMAL LOW (ref 3.5–5.0)
Alkaline Phosphatase: 105 U/L (ref 40–150)
BUN: 22.2 mg/dL (ref 7.0–26.0)
CALCIUM: 9.5 mg/dL (ref 8.4–10.4)
CHLORIDE: 107 meq/L (ref 98–109)
CO2: 26 meq/L (ref 22–29)
Creatinine: 1.1 mg/dL (ref 0.6–1.1)
Glucose: 93 mg/dl (ref 70–140)
POTASSIUM: 4 meq/L (ref 3.5–5.1)
Sodium: 142 mEq/L (ref 136–145)
Total Bilirubin: 0.28 mg/dL (ref 0.20–1.20)
Total Protein: 7.4 g/dL (ref 6.4–8.3)

## 2014-01-01 LAB — CBC WITH DIFFERENTIAL/PLATELET
BASO%: 1.5 % (ref 0.0–2.0)
Basophils Absolute: 0.1 10*3/uL (ref 0.0–0.1)
EOS%: 1.2 % (ref 0.0–7.0)
Eosinophils Absolute: 0.1 10*3/uL (ref 0.0–0.5)
HCT: 38.6 % (ref 34.8–46.6)
HGB: 12.2 g/dL (ref 11.6–15.9)
LYMPH%: 32.3 % (ref 14.0–49.7)
MCH: 28.8 pg (ref 25.1–34.0)
MCHC: 31.6 g/dL (ref 31.5–36.0)
MCV: 91.1 fL (ref 79.5–101.0)
MONO#: 0.7 10*3/uL (ref 0.1–0.9)
MONO%: 10.2 % (ref 0.0–14.0)
NEUT#: 3.6 10*3/uL (ref 1.5–6.5)
NEUT%: 54.8 % (ref 38.4–76.8)
Platelets: 197 10*3/uL (ref 145–400)
RBC: 4.24 10*6/uL (ref 3.70–5.45)
RDW: 16 % — AB (ref 11.2–14.5)
WBC: 6.6 10*3/uL (ref 3.9–10.3)
lymph#: 2.1 10*3/uL (ref 0.9–3.3)

## 2014-01-01 NOTE — Telephone Encounter (Signed)
gv and printed appt sched and avs fo rpt for July °

## 2014-01-01 NOTE — Progress Notes (Signed)
[place] [phone]   [phone]  CONSULT NOTE  REFERRING PHYSICIAN: Dr. Lance Morin  REASON FOR CONSULTATION:  80 years old African American female with recurrent lung cancer  HPI Beverly Lewis is a 80 y.o. female with a past medical history significant for hypertension, dyslipidemia, diabetes mellitus, stroke, GERD, peripheral vascular disease, moderate opacity as well as leaking abdominal aortic aneurysm. The patient also has a long history of smoking. In April 2013 the patient is diagnosed with early stage to be stage I (T2, N0, M0) non-small cell lung cancer, adenocarcinoma involving the right upper lobe. The patient was not a surgical candidate for resection and she was referred to Dr. Pablo Ledger for consideration of stereotactic radiotherapy. She underwent 5 fractions of treatment for a total dose of 5000 cGy. She was followed by observation and repeat scan over the last 2 years. She was noted on several his scan to have an enlarging right upper lobe mass which initially was thought to be representing evolving radiation effects. Repeat CT scan of the chest without contrast on 08/31/2013 showed enlarging right upper lobe lung mass which measured 5.6 x 3.4 CM compared to 4.2 x 3.3 CM on the previous scan in December of 2014. Dr. Pablo Ledger ordered a PET scan on this patient which was performed on 12/08/2013 and it showed Continued enlargement of a 6.1 x 8.2 cm right upper lobe mass which is hypermetabolic, and associated with lower right paratracheal hypermetabolic lymphadenopathy. At this time, this is compatible with at least T3, N2, Mx disease (i.e., at least stage IIIA). Dr. Pablo Ledger kindly referred the patient to me today for evaluation and recommendation regarding treatment of her condition. When seen today she continues to have mild pain at the right upper part of her chest which improved with aspirin cream. She also has shortness of  breath with minimal exertion but this is started more than 15 years ago after back surgery. The patient has cough productive of whitish sputum but no hemoptysis. She denied having any significant nausea or vomiting. She denied having any significant weight loss or night sweats. She has no headache or visual changes. Family history significant for her mother and father with heart disease and maternal grandmother with stomach cancer. The patient is a widow and has 4 children. She was accompanied by her grandson [place]. She used to work as a Secretary/administrator. She has a history of smoking for around 7 years and unfortunately she continues to smoke. She has no history of alcohol or drug abuse.   HPI  Past Medical History  Diagnosis Date  . Hypertension   . Hyperlipidemia   . Cerebrovascular accident history of mild cerebrovascular accident which caused some visual disturbance.   . Diabetes mellitus   . Morbid obesity   . Lung mass     Right upper Lobe  . Shortness of breath   . Pneumonia     hx  . Peripheral vascular disease     hx blood clot 30 yrs ago leg  . GERD (gastroesophageal reflux disease)   . Arthritis   . Pneumothorax, iatrogenic 8/2/113-discharge note    Post Biospy  . Cough with hemoptysis 01/22/12    Prior to discharge  . Tachycardia 01/22/12    Prior to discharge  . History of radiation therapy 02/18/12,02/23/12,02/25/12., 03/01/12,&03/03/12    RULlung 50Gy/5/fx  . Lung cancer 01/21/12    Past Surgical History  Procedure Laterality Date  . Abdominal aortic aneurysm repair  06/28/10    Stent Graft repair    . Exploratory laparotomy      For ectopic pregnancy  . Lumbar disc surgery    . Aortogram   03/03/11    with stenting , left external iliac artery  . Appendectomy    . Navigational bronchoscopy      Right Upper Lobe Mass with Biopsies, Brushings, Washings, and bronchoalveolar Lavage:  . Paratracheal adenopathy and bilateral adrenal nodules      Family History  Problem  Relation Age of Onset  . Heart disease Mother   . Hypertension Mother   . Diabetes Mother   . Hyperlipidemia Mother   . Heart disease Father   . Hypertension Father   . Diabetes Father   . Hyperlipidemia Father   . Diabetes Daughter     Social History History  Substance Use Topics  . Smoking status: Current Every Day Smoker -- 0.50 packs/day for 65 years    Types: Cigarettes  . Smokeless tobacco: Never Used  . Alcohol Use: No     Comment: She does not consume alcohol on a regular basis.    Allergies  Allergen Reactions  . Asa Buff (Mag [Buffered Aspirin]     In tolerance in higher dosages.    Current Outpatient Prescriptions  Medication Sig Dispense Refill  . allopurinol (ZYLOPRIM) 100 MG tablet Take 100 mg by mouth daily.       Marland Kitchen amLODipine (NORVASC) 10 MG tablet Take 10 mg by mouth daily.        Marland Kitchen aspirin EC 81 MG tablet Take 81 mg by mouth daily.      Marland Kitchen atorvastatin (LIPITOR) 20 MG tablet Take 20 mg by mouth daily.        Marland Kitchen COLCRYS 0.6 MG tablet Take 0.6 mg by mouth 2 (two) times daily as needed. For gout      . eprosartan-hydrochlorothiazide (TEVETEN HCT) 600-12.5 MG per tablet Take 1 tablet by mouth daily.       . Ergocalciferol (VITAMIN D2) 2000 UNITS TABS Take 1 tablet by mouth daily.      Marland Kitchen HYDROcodone-acetaminophen (LORCET) 10-650 MG per tablet Take 1 tablet by mouth every 8 (eight) hours as needed for pain.       Marland Kitchen JANUVIA 100 MG tablet Take 100 mg by mouth daily.       Marland Kitchen NEXIUM 40 MG capsule Take 40 mg by mouth daily before breakfast.       . NIASPAN 500 MG CR tablet        No current facility-administered medications for this visit.    Review of Systems  Constitutional: positive for fatigue Eyes: negative Ears, nose, mouth, throat, and face: negative Respiratory: positive for cough, dyspnea on exertion, pleurisy/chest pain and sputum Cardiovascular: negative Gastrointestinal: negative Genitourinary:negative Integument/breast:  negative Hematologic/lymphatic: negative Musculoskeletal:negative Neurological: negative Behavioral/Psych: negative Endocrine: negative Allergic/Immunologic: negative  Physical Exam  DZH:GDJME, healthy, no distress, well nourished and well developed SKIN: skin color, texture, turgor are normal, no rashes or significant lesions HEAD: Normocephalic, No masses, lesions, tenderness or abnormalities EYES: normal, PERRLA EARS: External ears normal, Canals clear OROPHARYNX:no exudate, no erythema and lips, buccal mucosa, and tongue normal  NECK: supple, no adenopathy, no JVD LYMPH:  no palpable lymphadenopathy, no hepatosplenomegaly BREAST:not examined LUNGS: clear to auscultation , and palpation HEART: regular rate & rhythm, no murmurs and no gallops ABDOMEN:abdomen soft, non-tender, obese, normal bowel sounds and no masses or organomegaly BACK: Back symmetric, no curvature., No CVA tenderness EXTREMITIES:no  joint deformities, effusion, or inflammation, no edema, no skin discoloration  NEURO: alert & oriented x 3 with fluent speech, no focal motor/sensory deficits  PERFORMANCE STATUS: ECOG 2  LABORATORY DATA: Lab Results  Component Value Date   WBC 6.6 01/01/2014   HGB 12.2 01/01/2014   HCT 38.6 01/01/2014   MCV 91.1 01/01/2014   PLT 197 01/01/2014      Chemistry      Component Value Date/Time   NA 142 01/01/2014 1119   NA 140 01/20/2012 0840   K 4.0 01/01/2014 1119   K 3.8 01/20/2012 0840   CL 103 01/20/2012 0840   CO2 26 01/01/2014 1119   CO2 24 01/20/2012 0840   BUN 22.2 01/01/2014 1119   BUN 24* 10/23/2013 1118   CREATININE 1.1 01/01/2014 1119   CREATININE 1.10 10/23/2013 1118   CREATININE 1.08 01/20/2012 0840      Component Value Date/Time   CALCIUM 9.5 01/01/2014 1119   CALCIUM 9.4 01/20/2012 0840   ALKPHOS 105 01/01/2014 1119   ALKPHOS 97 01/20/2012 0840   AST 14 01/01/2014 1119   AST 15 01/20/2012 0840   ALT 15 01/01/2014 1119   ALT 13 01/20/2012 0840   BILITOT 0.28 01/01/2014  1119   BILITOT 0.2* 01/20/2012 0840       RADIOGRAPHIC STUDIES: Nm Pet Image Initial (pi) Skull Base To Thigh  12/08/2013   CLINICAL DATA:  Subsequent treatment strategy for lung cancer. Restaging examination.  EXAM: NUCLEAR MEDICINE PET SKULL BASE TO THIGH  TECHNIQUE: 11.8 mCi F-18 FDG was injected intravenously. Full-ring PET imaging was performed from the skull base to thigh after the radiotracer. CT data was obtained and used for attenuation correction and anatomic localization.  FASTING BLOOD GLUCOSE:  Value: 112 mg/dl  COMPARISON:  PET-CT 10/21/2011.  FINDINGS: NECK  Borderline enlarged 1 cm intra parotid lymph nodes bilaterally demonstrate low-level hypermetabolism (SUVmax = 3.9 on the left and 4.7 on the right).) No other hypermetabolic lymph nodes in the neck.  CHEST  Considerable enlargement of a mass like opacity in the right upper lobe which currently measures approximately 6.1 x 8.2 cm (image 21 of series 8) and demonstrates diffuse hypermetabolism (SUVmax = 10.6). 1 cm short axis low right paratracheal lymph node is mildly hypermetabolic (SUVmax = 2.7). No other definite hypermetabolic mediastinal or hilar lymph nodes are noted. However, there is some asymmetric thickening of the distal esophagus shortly above the gastroesophageal junction best appreciated on image 77 of series for with there is focal hypermetabolism (SUVmax = 4.2). There is atherosclerosis of the thoracic aorta, the great vessels of the mediastinum and the coronary arteries, including calcified atherosclerotic plaque in the left main, left anterior descending, left circumflex and right coronary arteries. Bovine type aortic arch (normal anatomical variant) incidentally noted.  ABDOMEN/PELVIS  No abnormal hypermetabolic activity within the liver, pancreas, adrenal glands, or spleen. No hypermetabolic lymph nodes in the abdomen or pelvis. Low-attenuation adrenal nodules bilaterally measuring 1.8 cm on the right and 2.7 cm on the  left, compatible with adenomas. Extensive atherosclerosis, including a 4.1 x 3.8 cm fusiform infrarenal abdominal aortic aneurysm, and aneurysmal dilatation of the right common iliac artery which measures up to 3.2 cm in diameter. Post procedural changes of aorto bi-iliac stent graft placement are noted. Extensive colonic diverticulosis is noted, most severe in the descending and sigmoid colon, without surrounding inflammatory changes to suggest an acute diverticulitis at this time. No significant volume of ascites. No pneumoperitoneum. No pathologic distention of small bowel.  Uterus and ovaries are atrophic. Urinary bladder is unremarkable in appearance.  SKELETON  No focal hypermetabolic activity to suggest skeletal metastasis.  IMPRESSION: 1. Continued enlargement of a 6.1 x 8.2 cm right upper lobe mass which is hypermetabolic, and associated with lower right paratracheal hypermetabolic lymphadenopathy. At this time, this is compatible with at least T3, N2, Mx disease (i.e., at least stage IIIA). 2. Small area of asymmetric thickening of the distal esophageal wall shortly above the gastroesophageal junction demonstrates hypermetabolism. Correlation with endoscopy should be considered. Evaluate for potential esophageal neoplasm. 3. There also borderline enlarged hypermetabolic intra parotid lymph nodes bilaterally, which are nonspecific. These are favored to be reactive. 4. Additional incidental findings, similar to priors, as above.   Electronically Signed   By: Vinnie Langton M.D.   On: 12/08/2013 13:31    ASSESSMENT: This is a very pleasant 80 years old [place] American female presented with recurrent stage IIIA (T3, N2, MX) non-small cell lung cancer, adenocarcinoma with a large right upper lobe lung mass in addition to hypermetabolic mediastinal lymphadenopathy. This was initially diagnosed as stage IB non-small cell lung cancer, adenocarcinoma in April 2013 status post stereotactic radiotherapy to the  right upper lobe lung nodule.   PLAN: I had a lengthy discussion with the patient and her grandson today about her current disease stage, prognosis and treatment options. I'm not sure if the patient would be a candidate for repeat radiotherapy to the right upper lobe. If she is a candidate for radiotherapy I would consider her for a course of concurrent chemoradiation with weekly carboplatin for AUC of 2 and paclitaxel 45 mg/M2. If the patient is not a candidate for radiotherapy because of the prior treatment, I would consider her for treatment with reduced dose carboplatin for AUC of 4 and Alimta 500 mg/M2 every 3 weeks. I discussed with the patient and her grandson the adverse effects of the treatment including but not limited to alopecia, myelosuppression, nausea and vomiting, peripheral neuropathy, liver or renal dysfunction. I requested her previous biopsy to be sent for a molecular biomarker testing to see if the patient would be a candidate for target biologic agent. I would see the patient back for followup visit in 2 weeks for reevaluation and more detailed discussion of her treatment after consultation with Dr. Pablo Ledger regarding radiotherapy and also after the availability of the molecular biomarker results. I will also arrange for the patient to have CT scan or MRI of the head to rule out brain metastasis. The patient and her grandson agreed to the current plan. She was advised to call immediately if she has any concerning symptoms in the interval. The patient voices understanding of current disease status and treatment options and is in agreement with the current care plan.  All questions were answered. The patient knows to call the clinic with any problems, questions or concerns. We can certainly see the patient much sooner if necessary.  Thank you so much for allowing me to participate in the care of [facility]. I will continue to follow up the patient with you and assist in  her care.  I spent 40 minutes counseling the patient face to face. The total time spent in the appointment was 60 minutes.  Disclaimer: This note was dictated with voice recognition software. Similar sounding words can inadvertently be transcribed and may not be corrected upon review.   Arnette Driggs K. 01/01/2014, 6:37 PM

## 2014-01-01 NOTE — Progress Notes (Signed)
Checked in new pt with no financial concerns. °

## 2014-01-01 NOTE — Patient Instructions (Signed)
Smoking Cessation, Tips for Success If you are ready to quit smoking, congratulations! You have chosen to help yourself be healthier. Cigarettes bring nicotine, tar, carbon monoxide, and other irritants into your body. Your lungs, heart, and blood vessels will be able to work better without these poisons. There are many different ways to quit smoking. Nicotine gum, nicotine patches, a nicotine inhaler, or nicotine nasal spray can help with physical craving. Hypnosis, support groups, and medicines help break the habit of smoking. WHAT THINGS CAN I DO TO MAKE QUITTING EASIER?  Here are some tips to help you quit for good:  Pick a date when you will quit smoking completely. Tell all of your friends and family about your plan to quit on that date.  Do not try to slowly cut down on the number of cigarettes you are smoking. Pick a quit date and quit smoking completely starting on that day.  Throw away all cigarettes.   Clean and remove all ashtrays from your home, work, and car.   On a card, write down your reasons for quitting. Carry the card with you and read it when you get the urge to smoke.   Cleanse your body of nicotine. Drink enough water and fluids to keep your urine clear or pale yellow. Do this after quitting to flush the nicotine from your body.   Learn to predict your moods. Do not let a bad situation be your excuse to have a cigarette. Some situations in your life might tempt you into wanting a cigarette.   Never have "just one" cigarette. It leads to wanting another and another. Remind yourself of your decision to quit.   Change habits associated with smoking. If you smoked while driving or when feeling stressed, try other activities to replace smoking. Stand up when drinking your coffee. Brush your teeth after eating. Sit in a different chair when you read the paper. Avoid alcohol while trying to quit, and try to drink fewer caffeinated beverages. Alcohol and caffeine may urge  you to smoke.   Avoid foods and drinks that can trigger a desire to smoke, such as sugary or spicy foods and alcohol.   Ask people who smoke not to smoke around you.   Have something planned to do right after eating or having a cup of coffee. For example, plan to take a walk or exercise.   Try a relaxation exercise to calm you down and decrease your stress. Remember, you may be tense and nervous for the first 2 weeks after you quit, but this will pass.   Find new activities to keep your hands busy. Play with a pen, coin, or rubber band. Doodle or draw things on paper.   Brush your teeth right after eating. This will help cut down on the craving for the taste of tobacco after meals. You can also try mouthwash.   Use oral substitutes in place of cigarettes. Try using lemon drops, carrots, cinnamon sticks, or chewing gum. Keep them handy so they are available when you have the urge to smoke.   When you have the urge to smoke, try deep breathing.   Designate your home as a nonsmoking area.   If you are a heavy smoker, ask your health care provider about a prescription for nicotine chewing gum. It can ease your withdrawal from nicotine.   Reward yourself. Set aside the cigarette money you save and buy yourself something nice.   Look for support from others. Join a support group or   smoking cessation program. Ask someone at home or at work to help you with your plan to quit smoking.   Always ask yourself, "Do I need this cigarette or is this just a reflex?" Tell yourself, "Today, I choose not to smoke," or "I do not want to smoke." You are reminding yourself of your decision to quit.  Do not replace cigarette smoking with electronic cigarettes (commonly called e-cigarettes). The safety of e-cigarettes is unknown, and some may contain harmful chemicals.  If you relapse, do not give up! Plan ahead and think about what you will do the next time you get the urge to smoke.  HOW WILL  I FEEL WHEN I QUIT SMOKING? You may have symptoms of withdrawal because your body is used to nicotine (the addictive substance in cigarettes). You may crave cigarettes, be irritable, feel very hungry, cough often, get headaches, or have difficulty concentrating. The withdrawal symptoms are only temporary. They are strongest when you first quit but will go away within 10-14 days. When withdrawal symptoms occur, stay in control. Think about your reasons for quitting. Remind yourself that these are signs that your body is healing and getting used to being without cigarettes. Remember that withdrawal symptoms are easier to treat than the major diseases that smoking can cause.  Even after the withdrawal is over, expect periodic urges to smoke. However, these cravings are generally short lived and will go away whether you smoke or not. Do not smoke!  WHAT RESOURCES ARE AVAILABLE TO HELP ME QUIT SMOKING? Your health care provider can direct you to community resources or hospitals for support, which may include:  Group support.  Education.  Hypnosis.  Therapy. Document Released: 03/06/2004 Document Revised: 03/29/2013 Document Reviewed: 11/24/2012 [facility] Patient Information 2015 [place], [place]. This information is not intended to replace advice given to you by your health care provider. Make sure you discuss any questions you have with your health care provider.

## 2014-01-11 ENCOUNTER — Ambulatory Visit
Admission: RE | Admit: 2014-01-11 | Discharge: 2014-01-11 | Disposition: A | Discharge status: Home / Self Care | Payer: Medicare Other | Source: Ambulatory Visit | Attending: Radiation Oncology | Admitting: Radiation Oncology

## 2014-01-11 ENCOUNTER — Ambulatory Visit: Payer: Self-pay | Admitting: Radiation Oncology

## 2014-01-11 VITALS — BP 139/62 | HR 89 | Temp 98.0°F | Resp 12 | Wt 258.0 lb

## 2014-01-11 DIAGNOSIS — C3411 Malignant neoplasm of upper lobe, right bronchus or lung: Secondary | ICD-10-CM

## 2014-01-11 NOTE — Progress Notes (Signed)
   Department of Radiation Oncology  Phone:  [phone] Fax:        [phone]   Name: Beverly Lewis MRN: [id number]  DOB: 06/13/1929  Date: 01/11/2014  Follow Up Visit Note  Diagnosis: T2N0 Adenocarcinoma of the right upper lobe  Summary and Interval since last radiation: 2 years  Interval History: Beverly Lewis presents today for routine followup.  She met with Dr. Julien Nordmann who discussed concurrent reirradiation vs. Chemo alone.  She Has not had a biopsy.  An MRI has been ordered. She has no nnew complaints and is accompanied by her grandson.    Allergies:  Allergies  Allergen Reactions  . Asa Buff (Mag [Buffered Aspirin]     In tolerance in higher dosages.    Medications:  Current Outpatient Prescriptions  Medication Sig Dispense Refill  . allopurinol (ZYLOPRIM) 100 MG tablet Take 100 mg by mouth daily.       Marland Kitchen amLODipine (NORVASC) 10 MG tablet Take 10 mg by mouth daily.        Marland Kitchen aspirin EC 81 MG tablet Take 81 mg by mouth daily.      Marland Kitchen atorvastatin (LIPITOR) 20 MG tablet Take 20 mg by mouth daily.        Marland Kitchen COLCRYS 0.6 MG tablet Take 0.6 mg by mouth 2 (two) times daily as needed. For gout      . eprosartan-hydrochlorothiazide (TEVETEN HCT) 600-12.5 MG per tablet Take 1 tablet by mouth daily.       . Ergocalciferol (VITAMIN D2) 2000 UNITS TABS Take 1 tablet by mouth daily.      Marland Kitchen HYDROcodone-acetaminophen (LORCET) 10-650 MG per tablet Take 1 tablet by mouth every 8 (eight) hours as needed for pain.       Marland Kitchen JANUVIA 100 MG tablet Take 100 mg by mouth daily.       Marland Kitchen NEXIUM 40 MG capsule Take 40 mg by mouth daily before breakfast.       . NIASPAN 500 MG CR tablet        No current facility-administered medications for this encounter.    Physical Exam:  Filed Vitals:   01/11/14 0855  BP: 139/62  Pulse: 89  Temp: 98 F (36.7 C)  TempSrc: Oral  Resp: 12  Weight: 258 lb (117.028 kg)  SpO2: 96%   pleasant obese female sitting in a wheelchair.  IMPRESSION:  Beverly Lewis is a 80 y.o. female status post SBRT to a right upper lobe lesion with progressive/recurrent disease  PLAN:  Given her performance status and the risks of reirradiation, I do not believe she would tolerate this very well. We are also unlikely to cure her of this disease. I would recommend she undergo chemotherapy alone and if she does well with that, we can revisit definitive local treatment. I have scheduled her to see me back in 3 months for evaluation. She has an appointment with Dr. Julien Nordmann on Monday to discuss chemo in more detail.     Thea Silversmith, MD

## 2014-01-12 ENCOUNTER — Other Ambulatory Visit: Payer: Self-pay | Admitting: Internal Medicine

## 2014-01-15 ENCOUNTER — Encounter: Payer: Self-pay | Admitting: Physician Assistant

## 2014-01-15 ENCOUNTER — Other Ambulatory Visit (HOSPITAL_BASED_OUTPATIENT_CLINIC_OR_DEPARTMENT_OTHER): Payer: Medicare Other

## 2014-01-15 ENCOUNTER — Telehealth: Payer: Self-pay | Admitting: *Deleted

## 2014-01-15 ENCOUNTER — Ambulatory Visit (HOSPITAL_BASED_OUTPATIENT_CLINIC_OR_DEPARTMENT_OTHER): Payer: Medicare Other | Admitting: Physician Assistant

## 2014-01-15 ENCOUNTER — Telehealth: Payer: Self-pay | Admitting: Internal Medicine

## 2014-01-15 ENCOUNTER — Encounter: Payer: Self-pay | Admitting: *Deleted

## 2014-01-15 ENCOUNTER — Other Ambulatory Visit: Payer: Self-pay

## 2014-01-15 VITALS — BP 122/71 | HR 89 | Temp 98.6°F | Resp 19 | Ht 62.0 in

## 2014-01-15 DIAGNOSIS — C3410 Malignant neoplasm of upper lobe, unspecified bronchus or lung: Secondary | ICD-10-CM

## 2014-01-15 DIAGNOSIS — C3411 Malignant neoplasm of upper lobe, right bronchus or lung: Secondary | ICD-10-CM

## 2014-01-15 DIAGNOSIS — C3430 Malignant neoplasm of lower lobe, unspecified bronchus or lung: Secondary | ICD-10-CM

## 2014-01-15 LAB — CBC WITH DIFFERENTIAL/PLATELET
BASO%: 0.4 % (ref 0.0–2.0)
BASOS ABS: 0 10*3/uL (ref 0.0–0.1)
EOS ABS: 0 10*3/uL (ref 0.0–0.5)
EOS%: 0.8 % (ref 0.0–7.0)
HCT: 37.4 % (ref 34.8–46.6)
HGB: 12.2 g/dL (ref 11.6–15.9)
LYMPH#: 1.9 10*3/uL (ref 0.9–3.3)
LYMPH%: 31.1 % (ref 14.0–49.7)
MCH: 29.5 pg (ref 25.1–34.0)
MCHC: 32.6 g/dL (ref 31.5–36.0)
MCV: 90.5 fL (ref 79.5–101.0)
MONO#: 0.6 10*3/uL (ref 0.1–0.9)
MONO%: 9.3 % (ref 0.0–14.0)
NEUT%: 58.4 % (ref 38.4–76.8)
NEUTROS ABS: 3.6 10*3/uL (ref 1.5–6.5)
Platelets: 257 10*3/uL (ref 145–400)
RBC: 4.14 10*6/uL (ref 3.70–5.45)
RDW: 15.8 % — AB (ref 11.2–14.5)
WBC: 6.2 10*3/uL (ref 3.9–10.3)

## 2014-01-15 LAB — COMPREHENSIVE METABOLIC PANEL (CC13)
ALBUMIN: 3.3 g/dL — AB (ref 3.5–5.0)
ALT: 13 U/L (ref 0–55)
AST: 15 U/L (ref 5–34)
Alkaline Phosphatase: 104 U/L (ref 40–150)
Anion Gap: 10 mEq/L (ref 3–11)
BUN: 18.5 mg/dL (ref 7.0–26.0)
CALCIUM: 9.3 mg/dL (ref 8.4–10.4)
CHLORIDE: 107 meq/L (ref 98–109)
CO2: 25 mEq/L (ref 22–29)
Creatinine: 1.2 mg/dL — ABNORMAL HIGH (ref 0.6–1.1)
GLUCOSE: 149 mg/dL — AB (ref 70–140)
Potassium: 3.8 mEq/L (ref 3.5–5.1)
Sodium: 142 mEq/L (ref 136–145)
Total Bilirubin: 0.26 mg/dL (ref 0.20–1.20)
Total Protein: 7.3 g/dL (ref 6.4–8.3)

## 2014-01-15 MED ORDER — FOLIC ACID 1 MG PO TABS: 1.0000 mg | ORAL_TABLET | Freq: Every day | ORAL | Status: DC

## 2014-01-15 MED ORDER — PROCHLORPERAZINE MALEATE 10 MG PO TABS: 10.0000 mg | ORAL_TABLET | Freq: Four times a day (QID) | ORAL | Status: DC | PRN

## 2014-01-15 MED ORDER — CYANOCOBALAMIN 1000 MCG/ML IJ SOLN
1000.0000 ug | Freq: Once | INTRAMUSCULAR | Status: AC
  Administered 2014-01-15: 1000 ug via INTRAMUSCULAR

## 2014-01-15 MED ORDER — DEXAMETHASONE 4 MG PO TABS: ORAL_TABLET | ORAL | Status: DC

## 2014-01-15 NOTE — Progress Notes (Signed)
No images are attached to the encounter. No scans are attached to the encounter. No scans are attached to the encounter. [facility] SHARED VISIT PROGRESS NOTE  [name], MD [place] [address]  DIAGNOSIS: Lung cancer   Primary site: Lung (Right)   Staging method: AJCC 7th Edition   Clinical: Stage IIIA (T3, N2, M0) signed by Curt Bears, MD on 01/01/2014  6:57 PM   Summary: Stage IIIA (T3, N2, M0)  PRIOR THERAPY: none  CURRENT THERAPY:    DISEASE STAGE: Lung cancer   Primary site: Lung (Right)   Staging method: AJCC 7th Edition   Clinical: Stage IIIA (T3, N2, M0) signed by Curt Bears, MD on 01/01/2014  6:57 PM   Summary: Stage IIIA (T3, N2, M0)  CHEMOTHERAPY INTENT:   CURRENT # OF CHEMOTHERAPY CYCLES:  CURRENT ANTIEMETICS: compazine  CURRENT SMOKING STATUS: current smoker  ORAL CHEMOTHERAPY AND CONSENT: n/a  CURRENT BISPHOSPHONATES USE: none  PAIN MANAGEMENT: hydrocodone  NARCOTICS INDUCED CONSTIPATION: none  LIVING WILL AND CODE STATUS:    INTERVAL HISTORY: Beverly Lewis 80 y.o. female returns for a scheduled regular office visit for followup of her recently diagnosed stage IIIa (T3, N2, MX non-small cell lung cancer adenocarcinoma. She is accompanied by her grandson today. She was recently seen by Dr. Pablo Ledger who did not feel as though she would be a candidate for re- radiation therapy. She did not feel as such she would tolerate this well and is that we are unlikely to cure her disease. She recommended patient undergo chemotherapy alone and can revisit definitive local treatment at a later date if needed.  MEDICAL HISTORY: Past Medical History  Diagnosis Date  . Hypertension   . Hyperlipidemia   . Cerebrovascular accident history of mild cerebrovascular accident which caused some visual disturbance.   . Diabetes mellitus   . Morbid obesity   . Lung mass     Right upper Lobe  . Shortness of breath   .  Pneumonia     hx  . Peripheral vascular disease     hx blood clot 30 yrs ago leg  . GERD (gastroesophageal reflux disease)   . Arthritis   . Pneumothorax, iatrogenic 8/2/113-discharge note    Post Biospy  . Cough with hemoptysis 01/22/12    Prior to discharge  . Tachycardia 01/22/12    Prior to discharge  . History of radiation therapy 02/18/12,02/23/12,02/25/12., 03/01/12,&03/03/12    RULlung 50Gy/5/fx  . Lung cancer 01/21/12    ALLERGIES:  is allergic to asa buff (mag.  MEDICATIONS:  Current Outpatient Prescriptions  Medication Sig Dispense Refill  . allopurinol (ZYLOPRIM) 100 MG tablet Take 100 mg by mouth daily.       Marland Kitchen amLODipine (NORVASC) 10 MG tablet Take 10 mg by mouth daily.        Marland Kitchen aspirin EC 81 MG tablet Take 81 mg by mouth daily.      Marland Kitchen atorvastatin (LIPITOR) 20 MG tablet Take 20 mg by mouth daily.        Marland Kitchen COLCRYS 0.6 MG tablet Take 0.6 mg by mouth 2 (two) times daily as needed. For gout      . eprosartan-hydrochlorothiazide (TEVETEN HCT) 600-12.5 MG per tablet Take 1 tablet by mouth daily.       . Ergocalciferol (VITAMIN D2) 2000 UNITS TABS Take 1 tablet by mouth daily.      . furosemide (LASIX) 20 MG tablet       . HYDROcodone-acetaminophen (  LORCET) 10-650 MG per tablet Take 1 tablet by mouth every 8 (eight) hours as needed for pain.       Marland Kitchen JANUVIA 100 MG tablet Take 100 mg by mouth daily.       Marland Kitchen NEXIUM 40 MG capsule Take 40 mg by mouth daily before breakfast.       . NIASPAN 500 MG CR tablet       . dexamethasone (DECADRON) 4 MG tablet Take 1 tab twice a day on the day before, day of, day after chemo.  30 tablet  1  . folic acid (FOLVITE) 1 MG tablet Take 1 tablet (1 mg total) by mouth daily.  30 tablet  3  . prochlorperazine (COMPAZINE) 10 MG tablet Take 1 tablet (10 mg total) by mouth every 6 (six) hours as needed for nausea or vomiting.  30 tablet  1   No current facility-administered medications for this visit.    SURGICAL HISTORY:  Past Surgical History   Procedure Laterality Date  . Abdominal aortic aneurysm repair  06/28/10    Stent Graft repair    . Exploratory laparotomy      For ectopic pregnancy  . Lumbar disc surgery    . Aortogram   03/03/11    with stenting , left external iliac artery  . Appendectomy    . Navigational bronchoscopy      Right Upper Lobe Mass with Biopsies, Brushings, Washings, and bronchoalveolar Lavage:  . Paratracheal adenopathy and bilateral adrenal nodules      REVIEW OF SYSTEMS:  A comprehensive review of systems was negative except for: Behavioral/Psych: positive for depression and Related to her inability to receive radiation therapy at this time.   PHYSICAL EXAMINATION: Head: Normocephalic, without obvious abnormality, atraumatic Neck: no adenopathy, no carotid bruit, no JVD, supple, symmetrical, trachea midline and thyroid not enlarged, symmetric, no tenderness/mass/nodules Lymph nodes: Cervical, supraclavicular, and axillary nodes normal. Resp: clear to auscultation bilaterally Back: symmetric, no curvature. ROM normal. No CVA tenderness. Cardio: regular rate and rhythm, S1, S2 normal, no murmur, click, rub or gallop GI: soft, non-tender; bowel sounds normal; no masses,  no organomegaly Extremities: extremities normal, atraumatic, no cyanosis or edema Neurologic: Grossly normal  ECOG PERFORMANCE STATUS: 1 - Symptomatic but completely ambulatory  Blood pressure 122/71, pulse 89, temperature 98.6 F (37 C), temperature source Oral, resp. rate 19, height 5\' 2"  (1.575 m), weight 0 lb (0 kg).  LABORATORY DATA: Lab Results  Component Value Date   WBC 6.2 01/15/2014   HGB 12.2 01/15/2014   HCT 37.4 01/15/2014   MCV 90.5 01/15/2014   PLT 257 01/15/2014      Chemistry      Component Value Date/Time   NA 142 01/15/2014 1032   NA 140 01/20/2012 0840   K 3.8 01/15/2014 1032   K 3.8 01/20/2012 0840   CL 103 01/20/2012 0840   CO2 25 01/15/2014 1032   CO2 24 01/20/2012 0840   BUN 18.5 01/15/2014 1032   BUN  24* 10/23/2013 1118   CREATININE 1.2* 01/15/2014 1032   CREATININE 1.10 10/23/2013 1118   CREATININE 1.08 01/20/2012 0840      Component Value Date/Time   CALCIUM 9.3 01/15/2014 1032   CALCIUM 9.4 01/20/2012 0840   ALKPHOS 104 01/15/2014 1032   ALKPHOS 97 01/20/2012 0840   AST 15 01/15/2014 1032   AST 15 01/20/2012 0840   ALT 13 01/15/2014 1032   ALT 13 01/20/2012 0840   BILITOT 0.26 01/15/2014 1032  BILITOT 0.2* 01/20/2012 0840       RADIOGRAPHIC STUDIES:  No results found.   ASSESSMENT/PLAN: The patient is a pleasant 80 year old African American female recently diagnosed with stage with recurrent stage IIIa (T3 N2 MX) non-small cell lung cancer adenocarcinoma presenting with a large right upper lobe mass in addition to hypermetabolic mediastinal lymphadenopathy. Was initially diagnosed as stage IB non-small cell lung cancer adenocarcinoma in April of 2013 status post stereotactic radiotherapy to the right upper lobe lung nodule. As stated above patient was recently evaluated by Dr. Pablo Ledger who did not feel that the patient was a candidate for re-irradiation. The patient was discussed with and also seen by Dr. Julien Nordmann. He discussed starting systemic chemotherapy with Ms. Flight in the form of carboplatin for an AUC of 5 and Alimta 500 mg per meter squared given every 3 weeks. First cycle expected to be given the week of 01/22/2014. She'll be given a B12 injection today. A prescription for folic acid and Compazine as well as dexamethasone to be taken the day before the day of and day after chemotherapy were sent to her pharmacy of record via E. scribed. We contacted our pathology department and requested that her sample be sent for foundation 1 molecular testing. She'll followup in 2 weeks for a symptom management visit after completing her first cycle of systemic chemotherapy with carboplatin and Alimta. Major side effects of the chemotherapy were reviewed with the patient and her grandson which  include but are not limited to nausea, vomiting, low blood counts/neutropenia, hair loss. Patient is fully aware of these potential side effects and will wishes to proceed with systemic chemotherapy.    Wynetta Emery, Chayson Charters E, PA-C     All questions were answered. The patient knows to call the clinic with any problems, questions or concerns. We can certainly see the patient much sooner if necessary.

## 2014-01-15 NOTE — Progress Notes (Signed)
Called Polo Riley from Social Work to inform pateint answered 9/10 on distress discreening and is concerned with her diagnosis and how long she will make it. Patient is feeling hopelessness and anxiety. Lauren to see patient after Awilda Metro, PA.

## 2014-01-15 NOTE — Progress Notes (Unsigned)
[facility] Psychosocial Distress Screening Clinical Social Work  Clinical Social Work was referred by distress screening protocol.  The patient scored a 9 on the Psychosocial Distress Thermometer which indicates severe distress. Clinical Social Worker contacted patient by phone to assess for distress and other psychosocial needs.   ONCBCN DISTRESS SCREENING 01/15/2014  Screening Type Initial Screening  Mark the number that describes how much distress you have been experiencing in the past week 9  Practical problem type Housing  Emotional problem type Nervousness/Anxiety  Spiritual/Religous concerns type Relating to God  Physical Problem type Pain  Physician notified of physical symptoms No;Yes  Referral to clinical psychology No  Referral to clinical social work Yes  Referral to dietition No  Referral to financial advocate No  Referral to support programs No;Yes  Referral to palliative care No    Clinical Social Worker follow up needed: {yes no:[id number]}  If yes, follow up plan:  Polo Riley, MSW, LCSW, OSW-C Clinical Social Worker [place] [phone]

## 2014-01-15 NOTE — Telephone Encounter (Signed)
Pt confirmed labs/ov per 07/27 POF, gave pt AVS....KJ

## 2014-01-15 NOTE — Telephone Encounter (Signed)
Per POF staff message scheduled appts. Advised scheduler 

## 2014-01-16 ENCOUNTER — Other Ambulatory Visit (HOSPITAL_COMMUNITY)
Admission: RE | Admit: 2014-01-16 | Discharge: 2014-01-16 | Disposition: A | Discharge status: Home / Self Care | Payer: Medicare Other | Source: Ambulatory Visit | Attending: Internal Medicine | Admitting: Internal Medicine

## 2014-01-17 NOTE — Patient Instructions (Signed)
Take the folic acid daily as prescribed. Take the dexamethasone 4 mg by mouth twice daily the day before, the day of and the day after chemotherapy. You'll have a first cycle of chemotherapy next week Followup in 2 weeks for a symptom management visit

## 2014-01-18 ENCOUNTER — Encounter: Payer: Self-pay | Admitting: *Deleted

## 2014-01-18 NOTE — [facility] Oncology Navigator Note (Unsigned)
Pathology dept at [facility] called stating not enough tissue for testing.  I will update Dr. Julien Nordmann.

## 2014-01-22 ENCOUNTER — Other Ambulatory Visit (HOSPITAL_BASED_OUTPATIENT_CLINIC_OR_DEPARTMENT_OTHER): Payer: Medicare Other

## 2014-01-22 ENCOUNTER — Ambulatory Visit (HOSPITAL_BASED_OUTPATIENT_CLINIC_OR_DEPARTMENT_OTHER): Payer: Medicare Other

## 2014-01-22 ENCOUNTER — Other Ambulatory Visit: Payer: Self-pay | Admitting: Medical Oncology

## 2014-01-22 ENCOUNTER — Other Ambulatory Visit: Payer: Self-pay | Admitting: Internal Medicine

## 2014-01-22 VITALS — BP 158/88 | HR 72 | Temp 97.9°F | Resp 22

## 2014-01-22 DIAGNOSIS — C3410 Malignant neoplasm of upper lobe, unspecified bronchus or lung: Secondary | ICD-10-CM

## 2014-01-22 DIAGNOSIS — C3411 Malignant neoplasm of upper lobe, right bronchus or lung: Secondary | ICD-10-CM

## 2014-01-22 DIAGNOSIS — Z5111 Encounter for antineoplastic chemotherapy: Secondary | ICD-10-CM

## 2014-01-22 DIAGNOSIS — C3430 Malignant neoplasm of lower lobe, unspecified bronchus or lung: Secondary | ICD-10-CM

## 2014-01-22 DIAGNOSIS — C3490 Malignant neoplasm of unspecified part of unspecified bronchus or lung: Secondary | ICD-10-CM

## 2014-01-22 LAB — COMPREHENSIVE METABOLIC PANEL (CC13)
ALBUMIN: 3.7 g/dL (ref 3.5–5.0)
ALK PHOS: 99 U/L (ref 40–150)
ALT: 11 U/L (ref 0–55)
AST: 13 U/L (ref 5–34)
Anion Gap: 12 mEq/L — ABNORMAL HIGH (ref 3–11)
BILIRUBIN TOTAL: 0.32 mg/dL (ref 0.20–1.20)
BUN: 29.9 mg/dL — ABNORMAL HIGH (ref 7.0–26.0)
CO2: 20 mEq/L — ABNORMAL LOW (ref 22–29)
Calcium: 9.7 mg/dL (ref 8.4–10.4)
Chloride: 108 mEq/L (ref 98–109)
Creatinine: 1.2 mg/dL — ABNORMAL HIGH (ref 0.6–1.1)
GLUCOSE: 114 mg/dL (ref 70–140)
Potassium: 4 mEq/L (ref 3.5–5.1)
Sodium: 140 mEq/L (ref 136–145)
Total Protein: 7.8 g/dL (ref 6.4–8.3)

## 2014-01-22 LAB — CBC WITH DIFFERENTIAL/PLATELET
BASO%: 0.5 % (ref 0.0–2.0)
Basophils Absolute: 0 10*3/uL (ref 0.0–0.1)
EOS ABS: 0 10*3/uL (ref 0.0–0.5)
EOS%: 0 % (ref 0.0–7.0)
HCT: 39.7 % (ref 34.8–46.6)
HGB: 12.9 g/dL (ref 11.6–15.9)
LYMPH%: 16.6 % (ref 14.0–49.7)
MCH: 29.3 pg (ref 25.1–34.0)
MCHC: 32.4 g/dL (ref 31.5–36.0)
MCV: 90.4 fL (ref 79.5–101.0)
MONO#: 0.7 10*3/uL (ref 0.1–0.9)
MONO%: 7.3 % (ref 0.0–14.0)
NEUT%: 75.6 % (ref 38.4–76.8)
NEUTROS ABS: 7.1 10*3/uL — AB (ref 1.5–6.5)
Platelets: 242 10*3/uL (ref 145–400)
RBC: 4.39 10*6/uL (ref 3.70–5.45)
RDW: 16.1 % — AB (ref 11.2–14.5)
WBC: 9.4 10*3/uL (ref 3.9–10.3)
lymph#: 1.6 10*3/uL (ref 0.9–3.3)

## 2014-01-22 MED ORDER — SODIUM CHLORIDE 0.9 % IV SOLN
370.0000 mg | Freq: Once | INTRAVENOUS | Status: AC
  Administered 2014-01-22: 370 mg via INTRAVENOUS
  Filled 2014-01-22: qty 37

## 2014-01-22 MED ORDER — SODIUM CHLORIDE 0.9 % IV SOLN
Freq: Once | INTRAVENOUS | Status: AC
  Administered 2014-01-22: 11:00:00 via INTRAVENOUS

## 2014-01-22 MED ORDER — ONDANSETRON 16 MG/50ML IVPB (CHCC)
INTRAVENOUS | Status: AC
Start: 2014-01-22 — End: 2014-01-22
  Filled 2014-01-22: qty 16

## 2014-01-22 MED ORDER — OXYCODONE-ACETAMINOPHEN 5-325 MG PO TABS
1.0000 | ORAL_TABLET | Freq: Once | ORAL | Status: AC
  Administered 2014-01-22: 1 via ORAL

## 2014-01-22 MED ORDER — ONDANSETRON 16 MG/50ML IVPB (CHCC)
16.0000 mg | Freq: Once | INTRAVENOUS | Status: AC
  Administered 2014-01-22: 16 mg via INTRAVENOUS

## 2014-01-22 MED ORDER — DEXAMETHASONE SODIUM PHOSPHATE 20 MG/5ML IJ SOLN
INTRAMUSCULAR | Status: AC
  Filled 2014-01-22: qty 5

## 2014-01-22 MED ORDER — SODIUM CHLORIDE 0.9 % IV SOLN
500.0000 mg/m2 | Freq: Once | INTRAVENOUS | Status: AC
  Administered 2014-01-22: 1125 mg via INTRAVENOUS
  Filled 2014-01-22: qty 45

## 2014-01-22 MED ORDER — OXYCODONE-ACETAMINOPHEN 5-325 MG PO TABS
ORAL_TABLET | ORAL | Status: AC
  Filled 2014-01-22: qty 1

## 2014-01-22 MED ORDER — DEXAMETHASONE SODIUM PHOSPHATE 20 MG/5ML IJ SOLN
20.0000 mg | Freq: Once | INTRAMUSCULAR | Status: AC
  Administered 2014-01-22: 20 mg via INTRAVENOUS

## 2014-01-22 NOTE — Patient Instructions (Signed)
[facility] Discharge Instructions for Patients Receiving Chemotherapy  Today you received the following chemotherapy agents:  Carboplatin and Alimta   To help prevent nausea and vomiting after your treatment, we encourage you to take your nausea medication as ordered per MD.   If you develop nausea and vomiting that is not controlled by your nausea medication, call the clinic.   BELOW ARE SYMPTOMS THAT SHOULD BE REPORTED IMMEDIATELY:  *FEVER GREATER THAN 100.5 F  *CHILLS WITH OR WITHOUT FEVER  NAUSEA AND VOMITING THAT IS NOT CONTROLLED WITH YOUR NAUSEA MEDICATION  *UNUSUAL SHORTNESS OF BREATH  *UNUSUAL BRUISING OR BLEEDING  TENDERNESS IN MOUTH AND THROAT WITH OR WITHOUT PRESENCE OF ULCERS  *URINARY PROBLEMS  *BOWEL PROBLEMS  UNUSUAL RASH Items with * indicate a potential emergency and should be followed up as soon as possible.  Feel free to call the clinic you have any questions or concerns. The clinic phone number is [phone].

## 2014-01-24 ENCOUNTER — Telehealth: Payer: Self-pay | Admitting: *Deleted

## 2014-01-24 NOTE — Telephone Encounter (Signed)
Reports urine looks a little dark in color-instructed her to push more fluids. She has not had BM yet today despite stool softener yesterday and today. Will take laxative if no BM by tomorrow (chronic issue). Says she feels slightly weak today, but has been able to eat. Encouraged her to eat small frequent amounts and rest today. Reports some intermittent dull pain in her left foot that feels like it does when her gout is going to flare. She has her colchicine if needed. Reviewed her decadron/folic acid regimen and her antiemetic.

## 2014-02-01 ENCOUNTER — Ambulatory Visit: Payer: Medicare Other | Admitting: Radiation Oncology

## 2014-02-12 ENCOUNTER — Other Ambulatory Visit: Payer: Self-pay | Admitting: Physician Assistant

## 2014-02-12 ENCOUNTER — Other Ambulatory Visit: Payer: Self-pay | Admitting: Internal Medicine

## 2014-02-12 ENCOUNTER — Other Ambulatory Visit (HOSPITAL_BASED_OUTPATIENT_CLINIC_OR_DEPARTMENT_OTHER): Payer: Medicare Other

## 2014-02-12 ENCOUNTER — Ambulatory Visit (HOSPITAL_BASED_OUTPATIENT_CLINIC_OR_DEPARTMENT_OTHER): Payer: Medicare Other

## 2014-02-12 ENCOUNTER — Ambulatory Visit (HOSPITAL_BASED_OUTPATIENT_CLINIC_OR_DEPARTMENT_OTHER): Payer: Medicare Other | Admitting: Physician Assistant

## 2014-02-12 ENCOUNTER — Encounter: Payer: Self-pay | Admitting: Physician Assistant

## 2014-02-12 VITALS — BP 134/59 | HR 85 | Temp 98.1°F | Resp 20

## 2014-02-12 DIAGNOSIS — C3410 Malignant neoplasm of upper lobe, unspecified bronchus or lung: Secondary | ICD-10-CM

## 2014-02-12 DIAGNOSIS — R0989 Other specified symptoms and signs involving the circulatory and respiratory systems: Secondary | ICD-10-CM

## 2014-02-12 DIAGNOSIS — C3411 Malignant neoplasm of upper lobe, right bronchus or lung: Secondary | ICD-10-CM

## 2014-02-12 DIAGNOSIS — Z5111 Encounter for antineoplastic chemotherapy: Secondary | ICD-10-CM

## 2014-02-12 DIAGNOSIS — C3430 Malignant neoplasm of lower lobe, unspecified bronchus or lung: Secondary | ICD-10-CM

## 2014-02-12 DIAGNOSIS — R0609 Other forms of dyspnea: Secondary | ICD-10-CM

## 2014-02-12 DIAGNOSIS — F329 Major depressive disorder, single episode, unspecified: Secondary | ICD-10-CM

## 2014-02-12 DIAGNOSIS — F3289 Other specified depressive episodes: Secondary | ICD-10-CM

## 2014-02-12 LAB — COMPREHENSIVE METABOLIC PANEL (CC13)
ALT: 22 U/L (ref 0–55)
AST: 19 U/L (ref 5–34)
Albumin: 3.3 g/dL — ABNORMAL LOW (ref 3.5–5.0)
Alkaline Phosphatase: 115 U/L (ref 40–150)
Anion Gap: 12 mEq/L — ABNORMAL HIGH (ref 3–11)
BUN: 25.9 mg/dL (ref 7.0–26.0)
CO2: 22 meq/L (ref 22–29)
Calcium: 9.5 mg/dL (ref 8.4–10.4)
Chloride: 106 mEq/L (ref 98–109)
Creatinine: 1.1 mg/dL (ref 0.6–1.1)
Glucose: 105 mg/dl (ref 70–140)
Potassium: 4.3 mEq/L (ref 3.5–5.1)
SODIUM: 140 meq/L (ref 136–145)
TOTAL PROTEIN: 7.5 g/dL (ref 6.4–8.3)

## 2014-02-12 LAB — CBC WITH DIFFERENTIAL/PLATELET
BASO%: 0.4 % (ref 0.0–2.0)
Basophils Absolute: 0 10*3/uL (ref 0.0–0.1)
EOS%: 0 % (ref 0.0–7.0)
Eosinophils Absolute: 0 10*3/uL (ref 0.0–0.5)
HCT: 33.3 % — ABNORMAL LOW (ref 34.8–46.6)
HGB: 10.9 g/dL — ABNORMAL LOW (ref 11.6–15.9)
LYMPH%: 11.4 % — ABNORMAL LOW (ref 14.0–49.7)
MCH: 29.6 pg (ref 25.1–34.0)
MCHC: 32.8 g/dL (ref 31.5–36.0)
MCV: 90 fL (ref 79.5–101.0)
MONO#: 0.9 10*3/uL (ref 0.1–0.9)
MONO%: 12.8 % (ref 0.0–14.0)
NEUT#: 5.2 10*3/uL (ref 1.5–6.5)
NEUT%: 75.4 % (ref 38.4–76.8)
PLATELETS: 403 10*3/uL — AB (ref 145–400)
RBC: 3.7 10*6/uL (ref 3.70–5.45)
RDW: 15.9 % — ABNORMAL HIGH (ref 11.2–14.5)
WBC: 7 10*3/uL (ref 3.9–10.3)
lymph#: 0.8 10*3/uL — ABNORMAL LOW (ref 0.9–3.3)

## 2014-02-12 LAB — TECHNOLOGIST REVIEW

## 2014-02-12 MED ORDER — ONDANSETRON 16 MG/50ML IVPB (CHCC)
16.0000 mg | Freq: Once | INTRAVENOUS | Status: AC
  Administered 2014-02-12: 16 mg via INTRAVENOUS

## 2014-02-12 MED ORDER — DEXAMETHASONE SODIUM PHOSPHATE 20 MG/5ML IJ SOLN
20.0000 mg | Freq: Once | INTRAMUSCULAR | Status: AC
  Administered 2014-02-12: 20 mg via INTRAVENOUS

## 2014-02-12 MED ORDER — ONDANSETRON 16 MG/50ML IVPB (CHCC)
INTRAVENOUS | Status: AC
  Filled 2014-02-12: qty 16

## 2014-02-12 MED ORDER — DEXAMETHASONE SODIUM PHOSPHATE 20 MG/5ML IJ SOLN
INTRAMUSCULAR | Status: AC
  Filled 2014-02-12: qty 5

## 2014-02-12 MED ORDER — SODIUM CHLORIDE 0.9 % IV SOLN
500.0000 mg/m2 | Freq: Once | INTRAVENOUS | Status: AC
  Administered 2014-02-12: 1125 mg via INTRAVENOUS
  Filled 2014-02-12: qty 45

## 2014-02-12 MED ORDER — CARBOPLATIN CHEMO INJECTION 450 MG/45ML
370.0000 mg | Freq: Once | INTRAVENOUS | Status: AC
  Administered 2014-02-12: 370 mg via INTRAVENOUS
  Filled 2014-02-12: qty 37

## 2014-02-12 MED ORDER — SODIUM CHLORIDE 0.9 % IV SOLN
Freq: Once | INTRAVENOUS | Status: AC
  Administered 2014-02-12: 10:00:00 via INTRAVENOUS

## 2014-02-12 NOTE — Patient Instructions (Signed)
[facility] Discharge Instructions for Patients Receiving Chemotherapy  Today you received the following chemotherapy agents Carbo/Alimta  To help prevent nausea and vomiting after your treatment, we encourage you to take your nausea medication    If you develop nausea and vomiting that is not controlled by your nausea medication, call the clinic.   BELOW ARE SYMPTOMS THAT SHOULD BE REPORTED IMMEDIATELY:  *FEVER GREATER THAN 100.5 F  *CHILLS WITH OR WITHOUT FEVER  NAUSEA AND VOMITING THAT IS NOT CONTROLLED WITH YOUR NAUSEA MEDICATION  *UNUSUAL SHORTNESS OF BREATH  *UNUSUAL BRUISING OR BLEEDING  TENDERNESS IN MOUTH AND THROAT WITH OR WITHOUT PRESENCE OF ULCERS  *URINARY PROBLEMS  *BOWEL PROBLEMS  UNUSUAL RASH Items with * indicate a potential emergency and should be followed up as soon as possible.  Feel free to call the clinic you have any questions or concerns. The clinic phone number is [phone].

## 2014-02-12 NOTE — Progress Notes (Signed)
No images are attached to the encounter. No scans are attached to the encounter. No scans are attached to the encounter. [facility] SHARED VISIT PROGRESS NOTE  [name], MD [place] [address]  DIAGNOSIS: Lung cancer   Primary site: Lung (Right)   Staging method: AJCC 7th Edition   Clinical: Stage IIIA (T3, N2, M0) signed by Curt Bears, MD on 01/01/2014  6:57 PM   Summary: Stage IIIA (T3, N2, M0)  PRIOR THERAPY: none  CURRENT THERAPY:   Systemic chemotherapy with carboplatin for an AUC of 5 and Alimta 500 mg per meter squared given in 3 weeks. Status post 1 cycle.  DISEASE STAGE: Lung cancer   Primary site: Lung (Right)   Staging method: AJCC 7th Edition   Clinical: Stage IIIA (T3, N2, M0) signed by Curt Bears, MD on 01/01/2014  6:57 PM   Summary: Stage IIIA (T3, N2, M0)  CHEMOTHERAPY INTENT: palliative  CURRENT # OF CHEMOTHERAPY CYCLES: 2  CURRENT ANTIEMETICS: compazine  CURRENT SMOKING STATUS: current smoker  ORAL CHEMOTHERAPY AND CONSENT: n/a  CURRENT BISPHOSPHONATES USE: none  PAIN MANAGEMENT: hydrocodone  NARCOTICS INDUCED CONSTIPATION: none  LIVING WILL AND CODE STATUS:    INTERVAL HISTORY: Beverly Lewis 80 y.o. female returns for a scheduled regular office visit for followup of her recently diagnosed stage IIIa (T3, N2, MX non-small cell lung cancer adenocarcinoma. She status post 1 cycle of systemic chemotherapy with carboplatin and Alimta. Overall she tolerated the first cycle relatively well the exception of decreased appetite and malaise that lasted for approximately one week. She has intermittent episodes of coughing and a sensation of feeling warm however she has not taken her temperature she does not have a thermometer. She denied any documented fever or chills, nausea, vomiting diarrhea or constipation. She has some shortness breath with exertion. She denied significant weight loss or night sweats. She  presents today to proceed with cycle #2. Her general outlook has improved. She has accepted her chemotherapy treatment.    MEDICAL HISTORY: Past Medical History  Diagnosis Date  . Hypertension   . Hyperlipidemia   . Cerebrovascular accident history of mild cerebrovascular accident which caused some visual disturbance.   . Diabetes mellitus   . Morbid obesity   . Lung mass     Right upper Lobe  . Shortness of breath   . Pneumonia     hx  . Peripheral vascular disease     hx blood clot 30 yrs ago leg  . GERD (gastroesophageal reflux disease)   . Arthritis   . Pneumothorax, iatrogenic 8/2/113-discharge note    Post Biospy  . Cough with hemoptysis 01/22/12    Prior to discharge  . Tachycardia 01/22/12    Prior to discharge  . History of radiation therapy 02/18/12,02/23/12,02/25/12., 03/01/12,&03/03/12    RULlung 50Gy/5/fx  . Lung cancer 01/21/12    ALLERGIES:  is allergic to asa buff (mag.  MEDICATIONS:  Current Outpatient Prescriptions  Medication Sig Dispense Refill  . allopurinol (ZYLOPRIM) 100 MG tablet Take 100 mg by mouth daily.       Marland Kitchen amLODipine (NORVASC) 10 MG tablet Take 10 mg by mouth daily.        Marland Kitchen aspirin EC 81 MG tablet Take 81 mg by mouth daily.      Marland Kitchen atorvastatin (LIPITOR) 20 MG tablet Take 20 mg by mouth daily.        Marland Kitchen COLCRYS 0.6 MG tablet Take 0.6 mg by mouth 2 (two)  times daily as needed. For gout      . dexamethasone (DECADRON) 4 MG tablet Take 1 tab twice a day on the day before, day of, day after chemo.  30 tablet  1  . eprosartan-hydrochlorothiazide (TEVETEN HCT) 600-12.5 MG per tablet Take 1 tablet by mouth daily.       . Ergocalciferol (VITAMIN D2) 2000 UNITS TABS Take 1 tablet by mouth daily.      . folic acid (FOLVITE) 1 MG tablet Take 1 tablet (1 mg total) by mouth daily.  30 tablet  3  . furosemide (LASIX) 20 MG tablet       . HYDROcodone-acetaminophen (LORCET) 10-650 MG per tablet Take 1 tablet by mouth every 8 (eight) hours as needed for pain.       Marland Kitchen  JANUVIA 100 MG tablet Take 100 mg by mouth daily.       Marland Kitchen NEXIUM 40 MG capsule Take 40 mg by mouth daily before breakfast.       . NIASPAN 500 MG CR tablet       . prochlorperazine (COMPAZINE) 10 MG tablet Take 1 tablet (10 mg total) by mouth every 6 (six) hours as needed for nausea or vomiting.  30 tablet  1   No current facility-administered medications for this visit.   Facility-Administered Medications Ordered in Other Visits  Medication Dose Route Frequency Provider Last Rate Last Dose  . CARBOplatin (PARAPLATIN) 480 mg in sodium chloride 0.9 % 250 mL chemo infusion  480 mg Intravenous Once Curt Bears, MD        SURGICAL HISTORY:  Past Surgical History  Procedure Laterality Date  . Abdominal aortic aneurysm repair  06/28/10    Stent Graft repair    . Exploratory laparotomy      For ectopic pregnancy  . Lumbar disc surgery    . Aortogram   03/03/11    with stenting , left external iliac artery  . Appendectomy    . Navigational bronchoscopy      Right Upper Lobe Mass with Biopsies, Brushings, Washings, and bronchoalveolar Lavage:  . Paratracheal adenopathy and bilateral adrenal nodules      REVIEW OF SYSTEMS:  A comprehensive review of systems was negative except for: Behavioral/Psych: positive for depression and Related to her inability to receive radiation therapy at this time.   PHYSICAL EXAMINATION: Head: Normocephalic, without obvious abnormality, atraumatic Neck: no adenopathy, no carotid bruit, no JVD, supple, symmetrical, trachea midline and thyroid not enlarged, symmetric, no tenderness/mass/nodules Lymph nodes: Cervical, supraclavicular, and axillary nodes normal. Resp: clear to auscultation bilaterally Back: symmetric, no curvature. ROM normal. No CVA tenderness. Cardio: regular rate and rhythm, S1, S2 normal, no murmur, click, rub or gallop GI: soft, non-tender; bowel sounds normal; no masses,  no organomegaly Extremities: extremities normal, atraumatic, no  cyanosis or edema Neurologic: Grossly normal  ECOG PERFORMANCE STATUS: 1 - Symptomatic but completely ambulatory  There were no vitals taken for this visit.  LABORATORY DATA: Lab Results  Component Value Date   WBC 7.0 02/12/2014   HGB 10.9* 02/12/2014   HCT 33.3* 02/12/2014   MCV 90.0 02/12/2014   PLT 403* 02/12/2014      Chemistry      Component Value Date/Time   NA 140 02/12/2014 0823   NA 140 01/20/2012 0840   K 4.3 02/12/2014 0823   K 3.8 01/20/2012 0840   CL 103 01/20/2012 0840   CO2 22 02/12/2014 0823   CO2 24 01/20/2012 0840   BUN 25.9  02/12/2014 0823   BUN 24* 10/23/2013 1118   CREATININE 1.1 02/12/2014 0823   CREATININE 1.10 10/23/2013 1118   CREATININE 1.08 01/20/2012 0840      Component Value Date/Time   CALCIUM 9.5 02/12/2014 0823   CALCIUM 9.4 01/20/2012 0840   ALKPHOS 115 02/12/2014 0823   ALKPHOS 97 01/20/2012 0840   AST 19 02/12/2014 0823   AST 15 01/20/2012 0840   ALT 22 02/12/2014 0823   ALT 13 01/20/2012 0840   BILITOT <0.20 02/12/2014 0823   BILITOT 0.2* 01/20/2012 0840       RADIOGRAPHIC STUDIES:  No results found.   ASSESSMENT/PLAN: The patient is a pleasant 80 year old African American female recently diagnosed with stage with recurrent stage IIIa (T3 N2 MX) non-small cell lung cancer adenocarcinoma presenting with a large right upper lobe mass in addition to hypermetabolic mediastinal lymphadenopathy. Was initially diagnosed as stage IB non-small cell lung cancer adenocarcinoma in April of 2013 status post stereotactic radiotherapy to the right upper lobe lung nodule. As stated above patient was recently evaluated by Dr. Pablo Ledger who did not feel that the patient was a candidate for re-irradiation. The patient was discussed with and also seen by Dr. Julien Nordmann. She is currently receiving systemic chemotherapy with carboplatin for an AUC of 5 and Alimta 500 mg per meter squared given every 3 weeks. Status post one cycle.  Foundation 1 molecular testing results are  pending. She'll followup in 3 weeks for a symptom management visit prior to the start of cycle #3.   Wynetta Emery, Marg Macmaster E, PA-C   All questions were answered. The patient knows to call the clinic with any problems, questions or concerns. We can certainly see the patient much sooner if necessary.

## 2014-02-12 NOTE — Patient Instructions (Signed)
Continue weekly labs as scheduled Follow up in 3 weeks with your next cycle of chemotherapy

## 2014-02-13 ENCOUNTER — Telehealth: Payer: Self-pay | Admitting: *Deleted

## 2014-02-13 ENCOUNTER — Telehealth: Payer: Self-pay | Admitting: Internal Medicine

## 2014-02-13 NOTE — Telephone Encounter (Signed)
Per staff message and POF I have scheduled appts. Advised scheduler of appts. JMW  

## 2014-02-13 NOTE — Telephone Encounter (Signed)
, °

## 2014-02-19 ENCOUNTER — Other Ambulatory Visit: Payer: Self-pay | Admitting: *Deleted

## 2014-02-19 ENCOUNTER — Telehealth: Payer: Self-pay | Admitting: *Deleted

## 2014-02-19 ENCOUNTER — Other Ambulatory Visit (HOSPITAL_BASED_OUTPATIENT_CLINIC_OR_DEPARTMENT_OTHER): Payer: Medicare Other

## 2014-02-19 ENCOUNTER — Ambulatory Visit (HOSPITAL_BASED_OUTPATIENT_CLINIC_OR_DEPARTMENT_OTHER): Payer: Medicare Other

## 2014-02-19 ENCOUNTER — Telehealth: Payer: Self-pay | Admitting: Internal Medicine

## 2014-02-19 VITALS — BP 134/66 | HR 87 | Temp 98.1°F

## 2014-02-19 DIAGNOSIS — C3411 Malignant neoplasm of upper lobe, right bronchus or lung: Secondary | ICD-10-CM

## 2014-02-19 DIAGNOSIS — D702 Other drug-induced agranulocytosis: Secondary | ICD-10-CM

## 2014-02-19 DIAGNOSIS — Z5189 Encounter for other specified aftercare: Secondary | ICD-10-CM

## 2014-02-19 DIAGNOSIS — C3410 Malignant neoplasm of upper lobe, unspecified bronchus or lung: Secondary | ICD-10-CM

## 2014-02-19 LAB — COMPREHENSIVE METABOLIC PANEL (CC13)
ALT: 26 U/L (ref 0–55)
AST: 20 U/L (ref 5–34)
Albumin: 3.2 g/dL — ABNORMAL LOW (ref 3.5–5.0)
Alkaline Phosphatase: 106 U/L (ref 40–150)
Anion Gap: 10 mEq/L (ref 3–11)
BILIRUBIN TOTAL: 0.55 mg/dL (ref 0.20–1.20)
BUN: 24 mg/dL (ref 7.0–26.0)
CO2: 24 mEq/L (ref 22–29)
Calcium: 8.8 mg/dL (ref 8.4–10.4)
Chloride: 104 mEq/L (ref 98–109)
Creatinine: 1.1 mg/dL (ref 0.6–1.1)
GLUCOSE: 122 mg/dL (ref 70–140)
POTASSIUM: 4.2 meq/L (ref 3.5–5.1)
Sodium: 138 mEq/L (ref 136–145)
TOTAL PROTEIN: 7 g/dL (ref 6.4–8.3)

## 2014-02-19 LAB — CBC WITH DIFFERENTIAL/PLATELET
BASO%: 0 % (ref 0.0–2.0)
Basophils Absolute: 0 10*3/uL (ref 0.0–0.1)
EOS%: 2.9 % (ref 0.0–7.0)
Eosinophils Absolute: 0 10*3/uL (ref 0.0–0.5)
HCT: 32.9 % — ABNORMAL LOW (ref 34.8–46.6)
HGB: 10.7 g/dL — ABNORMAL LOW (ref 11.6–15.9)
LYMPH#: 0.7 10*3/uL — AB (ref 0.9–3.3)
LYMPH%: 52.2 % — ABNORMAL HIGH (ref 14.0–49.7)
MCH: 29.4 pg (ref 25.1–34.0)
MCHC: 32.5 g/dL (ref 31.5–36.0)
MCV: 90.4 fL (ref 79.5–101.0)
MONO#: 0.1 10*3/uL (ref 0.1–0.9)
MONO%: 5.1 % (ref 0.0–14.0)
NEUT#: 0.5 10*3/uL — CL (ref 1.5–6.5)
NEUT%: 39.8 % (ref 38.4–76.8)
NRBC: 0 % (ref 0–0)
Platelets: 182 10*3/uL (ref 145–400)
RBC: 3.64 10*6/uL — AB (ref 3.70–5.45)
RDW: 16.1 % — AB (ref 11.2–14.5)
WBC: 1.4 10*3/uL — AB (ref 3.9–10.3)

## 2014-02-19 MED ORDER — CIPROFLOXACIN HCL 500 MG PO TABS: 500.0000 mg | ORAL_TABLET | Freq: Two times a day (BID) | ORAL | Status: DC

## 2014-02-19 MED ORDER — TBO-FILGRASTIM 480 MCG/0.8ML ~~LOC~~ SOSY
480.0000 ug | PREFILLED_SYRINGE | Freq: Once | SUBCUTANEOUS | Status: AC
  Administered 2014-02-19: 480 ug via SUBCUTANEOUS
  Filled 2014-02-19: qty 0.8

## 2014-02-19 NOTE — Telephone Encounter (Signed)
added inj for 9/1 per 8/31 pof. gv pt new schedule. all other appts remain the same.

## 2014-02-19 NOTE — Telephone Encounter (Signed)
PT. HAS HAD LOOSE STOOLS AFTER TAKING MILK OF MAGNESIUM AND PRUNE JUICE ON 02/14/14. YESTERDAY SHE HAD DIARRHEA X2 WHICH STOPPED AFTER TAKING IMODIUM. PT. HAS HAD SHAKING CHILLS AND A COUGH. SHE DID NOT TAKE HER TEMPERATURE. PT.'S NEUTROPHIL COUNT IS 0.5. VERBAL ORDER AND READ BACK TO DR.MOHAMED- PT. TO RECEIVE A DOSE OF GRANIX 480MCG TODAY AND ANOTHER DOSE TOMORROW. SHE ALSO TO TAKE CIPRO 500MG  TWICE A DAY FOR FIVE DAYS. PT. TO USE NORMAL SALINE RINSES FOR HER MOUTH SORE. GAVE PT. THE ABOVE INSTRUCTIONS. ALSO ENCOURAGED PT. TO USE A STOOL SOFTER EVERY DAY SO SHE WILL HAVE A NORMAL BOWEL MOVEMENT EVERY THREE DAYS. THE OFFICE DID NOT HAVE ANY THERMOMETERS FOR PATIENTS SO ENCOURAGED PT. TO OBTAIN ONE TO KEEP A CHECK ON HER TEMPERATURE. SHE VOICES UNDERSTANDING.

## 2014-02-20 ENCOUNTER — Ambulatory Visit (HOSPITAL_BASED_OUTPATIENT_CLINIC_OR_DEPARTMENT_OTHER): Payer: Medicare Other

## 2014-02-20 ENCOUNTER — Other Ambulatory Visit: Payer: Self-pay | Admitting: Physician Assistant

## 2014-02-20 VITALS — BP 112/69 | HR 98 | Temp 99.2°F

## 2014-02-20 DIAGNOSIS — D702 Other drug-induced agranulocytosis: Secondary | ICD-10-CM

## 2014-02-20 DIAGNOSIS — C3410 Malignant neoplasm of upper lobe, unspecified bronchus or lung: Secondary | ICD-10-CM

## 2014-02-20 DIAGNOSIS — Z5189 Encounter for other specified aftercare: Secondary | ICD-10-CM

## 2014-02-20 MED ORDER — TBO-FILGRASTIM 480 MCG/0.8ML ~~LOC~~ SOSY
480.0000 ug | PREFILLED_SYRINGE | Freq: Once | SUBCUTANEOUS | Status: AC
  Administered 2014-02-20: 480 ug via SUBCUTANEOUS
  Filled 2014-02-20: qty 0.8

## 2014-02-27 ENCOUNTER — Other Ambulatory Visit: Payer: Medicare Other

## 2014-02-28 ENCOUNTER — Other Ambulatory Visit (HOSPITAL_BASED_OUTPATIENT_CLINIC_OR_DEPARTMENT_OTHER): Payer: Medicare Other

## 2014-02-28 ENCOUNTER — Encounter (HOSPITAL_COMMUNITY): Payer: Self-pay

## 2014-02-28 DIAGNOSIS — C3410 Malignant neoplasm of upper lobe, unspecified bronchus or lung: Secondary | ICD-10-CM

## 2014-02-28 DIAGNOSIS — C3411 Malignant neoplasm of upper lobe, right bronchus or lung: Secondary | ICD-10-CM

## 2014-02-28 LAB — COMPREHENSIVE METABOLIC PANEL (CC13)
ALT: 14 U/L (ref 0–55)
AST: 14 U/L (ref 5–34)
Albumin: 3 g/dL — ABNORMAL LOW (ref 3.5–5.0)
Alkaline Phosphatase: 129 U/L (ref 40–150)
Anion Gap: 13 mEq/L — ABNORMAL HIGH (ref 3–11)
BUN: 26.2 mg/dL — AB (ref 7.0–26.0)
CALCIUM: 8.6 mg/dL (ref 8.4–10.4)
CO2: 22 mEq/L (ref 22–29)
Chloride: 107 mEq/L (ref 98–109)
Creatinine: 1.5 mg/dL — ABNORMAL HIGH (ref 0.6–1.1)
GLUCOSE: 117 mg/dL (ref 70–140)
Potassium: 4 mEq/L (ref 3.5–5.1)
Sodium: 141 mEq/L (ref 136–145)
Total Bilirubin: <0.2 mg/dL (ref 0.20–1.20)
Total Protein: 6.9 g/dL (ref 6.4–8.3)

## 2014-02-28 LAB — CBC WITH DIFFERENTIAL/PLATELET
BASO%: 0.7 % (ref 0.0–2.0)
Basophils Absolute: 0.1 10*3/uL (ref 0.0–0.1)
EOS%: 0.5 % (ref 0.0–7.0)
Eosinophils Absolute: 0.1 10*3/uL (ref 0.0–0.5)
HCT: 29.9 % — ABNORMAL LOW (ref 34.8–46.6)
HEMOGLOBIN: 9.7 g/dL — AB (ref 11.6–15.9)
LYMPH%: 10.7 % — ABNORMAL LOW (ref 14.0–49.7)
MCH: 29.4 pg (ref 25.1–34.0)
MCHC: 32.3 g/dL (ref 31.5–36.0)
MCV: 90.9 fL (ref 79.5–101.0)
MONO#: 1.4 10*3/uL — ABNORMAL HIGH (ref 0.1–0.9)
MONO%: 13.6 % (ref 0.0–14.0)
NEUT#: 7.5 10*3/uL — ABNORMAL HIGH (ref 1.5–6.5)
NEUT%: 74.5 % (ref 38.4–76.8)
PLATELETS: 134 10*3/uL — AB (ref 145–400)
RBC: 3.29 10*6/uL — ABNORMAL LOW (ref 3.70–5.45)
RDW: 16.4 % — ABNORMAL HIGH (ref 11.2–14.5)
WBC: 10.1 10*3/uL (ref 3.9–10.3)
lymph#: 1.1 10*3/uL (ref 0.9–3.3)

## 2014-03-01 ENCOUNTER — Ambulatory Visit: Payer: Medicare Other | Admitting: Internal Medicine

## 2014-03-01 ENCOUNTER — Other Ambulatory Visit: Payer: Medicare Other

## 2014-03-05 ENCOUNTER — Ambulatory Visit (HOSPITAL_BASED_OUTPATIENT_CLINIC_OR_DEPARTMENT_OTHER): Payer: Medicare Other | Admitting: Internal Medicine

## 2014-03-05 ENCOUNTER — Other Ambulatory Visit (HOSPITAL_BASED_OUTPATIENT_CLINIC_OR_DEPARTMENT_OTHER): Payer: Medicare Other

## 2014-03-05 ENCOUNTER — Telehealth: Payer: Self-pay | Admitting: *Deleted

## 2014-03-05 ENCOUNTER — Other Ambulatory Visit: Payer: Medicare Other

## 2014-03-05 ENCOUNTER — Telehealth: Payer: Self-pay | Admitting: Internal Medicine

## 2014-03-05 ENCOUNTER — Ambulatory Visit (HOSPITAL_BASED_OUTPATIENT_CLINIC_OR_DEPARTMENT_OTHER): Payer: Medicare Other

## 2014-03-05 VITALS — BP 144/94 | HR 98 | Temp 98.8°F | Resp 19 | Ht 62.0 in | Wt 253.6 lb

## 2014-03-05 DIAGNOSIS — C3410 Malignant neoplasm of upper lobe, unspecified bronchus or lung: Secondary | ICD-10-CM

## 2014-03-05 DIAGNOSIS — C3430 Malignant neoplasm of lower lobe, unspecified bronchus or lung: Secondary | ICD-10-CM

## 2014-03-05 DIAGNOSIS — Z5111 Encounter for antineoplastic chemotherapy: Secondary | ICD-10-CM

## 2014-03-05 DIAGNOSIS — C3412 Malignant neoplasm of upper lobe, left bronchus or lung: Secondary | ICD-10-CM

## 2014-03-05 DIAGNOSIS — C3411 Malignant neoplasm of upper lobe, right bronchus or lung: Secondary | ICD-10-CM

## 2014-03-05 LAB — COMPREHENSIVE METABOLIC PANEL (CC13)
ALK PHOS: 111 U/L (ref 40–150)
ALT: 14 U/L (ref 0–55)
ANION GAP: 11 meq/L (ref 3–11)
AST: 12 U/L (ref 5–34)
Albumin: 3.2 g/dL — ABNORMAL LOW (ref 3.5–5.0)
BUN: 32.5 mg/dL — AB (ref 7.0–26.0)
CALCIUM: 9.3 mg/dL (ref 8.4–10.4)
CHLORIDE: 103 meq/L (ref 98–109)
CO2: 25 meq/L (ref 22–29)
Creatinine: 1.2 mg/dL — ABNORMAL HIGH (ref 0.6–1.1)
GLUCOSE: 153 mg/dL — AB (ref 70–140)
POTASSIUM: 4 meq/L (ref 3.5–5.1)
SODIUM: 140 meq/L (ref 136–145)
TOTAL PROTEIN: 7.6 g/dL (ref 6.4–8.3)
Total Bilirubin: 0.28 mg/dL (ref 0.20–1.20)

## 2014-03-05 LAB — CBC WITH DIFFERENTIAL/PLATELET
BASO%: 0.6 % (ref 0.0–2.0)
BASOS ABS: 0.1 10*3/uL (ref 0.0–0.1)
EOS ABS: 0 10*3/uL (ref 0.0–0.5)
EOS%: 0 % (ref 0.0–7.0)
HCT: 31.4 % — ABNORMAL LOW (ref 34.8–46.6)
HGB: 10.2 g/dL — ABNORMAL LOW (ref 11.6–15.9)
LYMPH%: 6.1 % — ABNORMAL LOW (ref 14.0–49.7)
MCH: 29.7 pg (ref 25.1–34.0)
MCHC: 32.5 g/dL (ref 31.5–36.0)
MCV: 91.3 fL (ref 79.5–101.0)
MONO#: 0.5 10*3/uL (ref 0.1–0.9)
MONO%: 5.1 % (ref 0.0–14.0)
NEUT%: 88.2 % — ABNORMAL HIGH (ref 38.4–76.8)
NEUTROS ABS: 8.9 10*3/uL — AB (ref 1.5–6.5)
Platelets: 421 10*3/uL — ABNORMAL HIGH (ref 145–400)
RBC: 3.43 10*6/uL — ABNORMAL LOW (ref 3.70–5.45)
RDW: 16.8 % — AB (ref 11.2–14.5)
WBC: 10.1 10*3/uL (ref 3.9–10.3)
lymph#: 0.6 10*3/uL — ABNORMAL LOW (ref 0.9–3.3)

## 2014-03-05 MED ORDER — ONDANSETRON 16 MG/50ML IVPB (CHCC)
16.0000 mg | Freq: Once | INTRAVENOUS | Status: AC
Start: 2014-03-05 — End: 2014-03-05
  Administered 2014-03-05: 16 mg via INTRAVENOUS

## 2014-03-05 MED ORDER — CYANOCOBALAMIN 1000 MCG/ML IJ SOLN
1000.0000 ug | Freq: Once | INTRAMUSCULAR | Status: AC
  Administered 2014-03-05: 1000 ug via INTRAMUSCULAR

## 2014-03-05 MED ORDER — ONDANSETRON 16 MG/50ML IVPB (CHCC)
INTRAVENOUS | Status: AC
  Filled 2014-03-05: qty 16

## 2014-03-05 MED ORDER — DEXAMETHASONE SODIUM PHOSPHATE 20 MG/5ML IJ SOLN
20.0000 mg | Freq: Once | INTRAMUSCULAR | Status: AC
  Administered 2014-03-05: 20 mg via INTRAVENOUS

## 2014-03-05 MED ORDER — PEMETREXED DISODIUM CHEMO INJECTION 500 MG
400.0000 mg/m2 | Freq: Once | INTRAVENOUS | Status: AC
  Administered 2014-03-05: 900 mg via INTRAVENOUS
  Filled 2014-03-05: qty 36

## 2014-03-05 MED ORDER — CYANOCOBALAMIN 1000 MCG/ML IJ SOLN
INTRAMUSCULAR | Status: AC
  Filled 2014-03-05: qty 1

## 2014-03-05 MED ORDER — SODIUM CHLORIDE 0.9 % IV SOLN
362.4000 mg | Freq: Once | INTRAVENOUS | Status: AC
  Administered 2014-03-05: 360 mg via INTRAVENOUS
  Filled 2014-03-05: qty 36

## 2014-03-05 MED ORDER — DEXAMETHASONE SODIUM PHOSPHATE 20 MG/5ML IJ SOLN
INTRAMUSCULAR | Status: AC
  Filled 2014-03-05: qty 5

## 2014-03-05 MED ORDER — SODIUM CHLORIDE 0.9 % IV SOLN
Freq: Once | INTRAVENOUS | Status: AC
  Administered 2014-03-05: 11:00:00 via INTRAVENOUS

## 2014-03-05 NOTE — Telephone Encounter (Signed)
Called and informed patient to increase po fluid and creatinine has increased.  Per Awilda Metro, PA. Patient verbalized understanding.

## 2014-03-05 NOTE — Progress Notes (Signed)
[facility] [phone]   [phone]) [phone]  OFFICE PROGRESS NOTE  [name], MD [place] [address]  DIAGNOSIS: Lung cancer  Primary site: Lung (Right)  Staging method: AJCC 7th Edition  Clinical: Stage IIIA (T3, N2, M0) signed by Curt Bears, MD on 01/01/2014 6:57 PM  Summary: Stage IIIA (T3, N2, M0)   PRIOR THERAPY: none   CURRENT THERAPY:  Systemic chemotherapy with carboplatin for an AUC of 5 and Alimta 500 mg per meter squared given in 3 weeks. Status post 2 cycles.   DISEASE STAGE:  Lung cancer  Primary site: Lung (Right)  Staging method: AJCC 7th Edition  Clinical: Stage IIIA (T3, N2, M0) signed by Curt Bears, MD on 01/01/2014 6:57 PM  Summary: Stage IIIA (T3, N2, M0)  CHEMOTHERAPY INTENT: palliative  CURRENT # OF CHEMOTHERAPY CYCLES: 2  CURRENT ANTIEMETICS: compazine  CURRENT SMOKING STATUS: current smoker  ORAL CHEMOTHERAPY AND CONSENT: n/a  CURRENT BISPHOSPHONATES USE: none  PAIN MANAGEMENT: hydrocodone  NARCOTICS INDUCED CONSTIPATION: none  LIVING WILL AND CODE STATUS:   INTERVAL HISTORY: Beverly Lewis 80 y.o. female returns to the clinic today for followup visit accompanied by her grandson. The patient tolerated the first 2 cycles of her systemic chemotherapy fairly well with no significant adverse effects except for mild fatigue and occasional nausea. She denied having any significant fever or chills. She denied having any weight loss or night sweats. She has no chest pain but continues to have shortness of breath at baseline and increased with exertion with no cough or hemoptysis. The patient is here today to start cycle #3 of her chemotherapy.  MEDICAL HISTORY: Past Medical History  Diagnosis Date  . Hypertension   . Hyperlipidemia   . Cerebrovascular accident history of mild cerebrovascular accident which caused some visual disturbance.   . Diabetes mellitus   . Morbid obesity   .  Lung mass     Right upper Lobe  . Shortness of breath   . Pneumonia     hx  . Peripheral vascular disease     hx blood clot 30 yrs ago leg  . GERD (gastroesophageal reflux disease)   . Arthritis   . Pneumothorax, iatrogenic 8/2/113-discharge note    Post Biospy  . Cough with hemoptysis 01/22/12    Prior to discharge  . Tachycardia 01/22/12    Prior to discharge  . History of radiation therapy 02/18/12,02/23/12,02/25/12., 03/01/12,&03/03/12    RULlung 50Gy/5/fx  . Lung cancer 01/21/12    ALLERGIES:  is allergic to asa buff (mag.  MEDICATIONS:  Current Outpatient Prescriptions  Medication Sig Dispense Refill  . allopurinol (ZYLOPRIM) 100 MG tablet Take 100 mg by mouth daily.       Marland Kitchen amLODipine (NORVASC) 10 MG tablet Take 10 mg by mouth daily.        Marland Kitchen aspirin EC 81 MG tablet Take 81 mg by mouth daily.      Marland Kitchen atorvastatin (LIPITOR) 20 MG tablet Take 20 mg by mouth daily.        . ciprofloxacin (CIPRO) 500 MG tablet Take 1 tablet (500 mg total) by mouth 2 (two) times daily. X 5 days  10 tablet  0  . COLCRYS 0.6 MG tablet Take 0.6 mg by mouth 2 (two) times daily as needed. For gout      . dexamethasone (DECADRON) 4 MG tablet Take 1 tab twice a day on the day before, day of, day after chemo.  30 tablet  1  . eprosartan-hydrochlorothiazide (TEVETEN HCT) 600-12.5 MG per tablet Take 1 tablet by mouth daily.       . Ergocalciferol (VITAMIN D2) 2000 UNITS TABS Take 1 tablet by mouth daily.      . folic acid (FOLVITE) 1 MG tablet Take 1 tablet (1 mg total) by mouth daily.  30 tablet  3  . furosemide (LASIX) 20 MG tablet       . HYDROcodone-acetaminophen (LORCET) 10-650 MG per tablet Take 1 tablet by mouth every 8 (eight) hours as needed for pain.       Marland Kitchen JANUVIA 100 MG tablet Take 100 mg by mouth daily.       Marland Kitchen NEXIUM 40 MG capsule Take 40 mg by mouth daily before breakfast.       . NIASPAN 500 MG CR tablet       . prochlorperazine (COMPAZINE) 10 MG tablet Take 1 tablet (10 mg total) by mouth every 6  (six) hours as needed for nausea or vomiting.  30 tablet  1   No current facility-administered medications for this visit.    SURGICAL HISTORY:  Past Surgical History  Procedure Laterality Date  . Abdominal aortic aneurysm repair  06/28/10    Stent Graft repair    . Exploratory laparotomy      For ectopic pregnancy  . Lumbar disc surgery    . Aortogram   03/03/11    with stenting , left external iliac artery  . Appendectomy    . Navigational bronchoscopy      Right Upper Lobe Mass with Biopsies, Brushings, Washings, and bronchoalveolar Lavage:  . Paratracheal adenopathy and bilateral adrenal nodules      REVIEW OF SYSTEMS:  A comprehensive review of systems was negative except for: Constitutional: positive for fatigue Gastrointestinal: positive for nausea   PHYSICAL EXAMINATION: General appearance: alert, cooperative, fatigued and no distress Head: Normocephalic, without obvious abnormality, atraumatic Neck: no adenopathy, no JVD, supple, symmetrical, trachea midline and thyroid not enlarged, symmetric, no tenderness/mass/nodules Lymph nodes: Cervical, supraclavicular, and axillary nodes normal. Resp: clear to auscultation bilaterally Back: symmetric, no curvature. ROM normal. No CVA tenderness. Cardio: regular rate and rhythm, S1, S2 normal, no murmur, click, rub or gallop GI: soft, non-tender; bowel sounds normal; no masses,  no organomegaly Extremities: extremities normal, atraumatic, no cyanosis or edema  ECOG PERFORMANCE STATUS: 2 - Symptomatic, <50% confined to bed  Blood pressure 144/94, pulse 98, temperature 98.8 F (37.1 C), temperature source Oral, resp. rate 19, height 5\' 2"  (1.575 m), weight 253 lb 9.6 oz (115.032 kg), SpO2 100.00%.  LABORATORY DATA: Lab Results  Component Value Date   WBC 10.1 03/05/2014   HGB 10.2* 03/05/2014   HCT 31.4* 03/05/2014   MCV 91.3 03/05/2014   PLT 421* 03/05/2014      Chemistry      Component Value Date/Time   NA 140 03/05/2014  0920   NA 140 01/20/2012 0840   K 4.0 03/05/2014 0920   K 3.8 01/20/2012 0840   CL 103 01/20/2012 0840   CO2 25 03/05/2014 0920   CO2 24 01/20/2012 0840   BUN 32.5* 03/05/2014 0920   BUN 24* 10/23/2013 1118   CREATININE 1.2* 03/05/2014 0920   CREATININE 1.10 10/23/2013 1118   CREATININE 1.08 01/20/2012 0840      Component Value Date/Time   CALCIUM 9.3 03/05/2014 0920   CALCIUM 9.4 01/20/2012 0840   ALKPHOS 111 03/05/2014 0920   ALKPHOS 97 01/20/2012 0840   AST  12 03/05/2014 0920   AST 15 01/20/2012 0840   ALT 14 03/05/2014 0920   ALT 13 01/20/2012 0840   BILITOT 0.28 03/05/2014 0920   BILITOT 0.2* 01/20/2012 0840       RADIOGRAPHIC STUDIES: No results found.  ASSESSMENT AND PLAN: This is a very pleasant 80 years old [place] American female with unresectable stage IIIA non-small cell lung cancer and the patient is not a candidate for radiotherapy. She is currently undergoing systemic chemotherapy with carboplatin and Alimta status post 2 cycles. She is tolerating her treatment fairly well except for the fatigue and occasional nausea. I recommended for her to proceed with cycle #3 today as scheduled but reduce the dose of carboplatin to AUC of 4 and Alimta at 400 mg/M2 every 3 weeks. The patient would come back for followup visit in 3 weeks after repeating CT scan of the chest for restaging of her disease. She was advised to call immediately if she has any concerning symptoms in the interval. The patient voices understanding of current disease status and treatment options and is in agreement with the current care plan.  All questions were answered. The patient knows to call the clinic with any problems, questions or concerns. We can certainly see the patient much sooner if necessary.  Disclaimer: This note was dictated with voice recognition software. Similar sounding words can inadvertently be transcribed and may not be corrected upon review.

## 2014-03-05 NOTE — Telephone Encounter (Signed)
Message copied by Wardell Heath on Mon Mar 05, 2014  1:51 PM ------      Message from: Carlton Adam      Created: Mon Mar 05, 2014  9:28 AM       Abnormal results, please call  and notify patient to increase po fluids. Creatinine is increased       ------

## 2014-03-05 NOTE — Patient Instructions (Signed)
[facility] Discharge Instructions for Patients Receiving Chemotherapy  Today you received the following chemotherapy agents: Alimta and Carboplatin.  To help prevent nausea and vomiting after your treatment, we encourage you to take your nausea medication as prescribed.   If you develop nausea and vomiting that is not controlled by your nausea medication, call the clinic.   BELOW ARE SYMPTOMS THAT SHOULD BE REPORTED IMMEDIATELY:  *FEVER GREATER THAN 100.5 F  *CHILLS WITH OR WITHOUT FEVER  NAUSEA AND VOMITING THAT IS NOT CONTROLLED WITH YOUR NAUSEA MEDICATION  *UNUSUAL SHORTNESS OF BREATH  *UNUSUAL BRUISING OR BLEEDING  TENDERNESS IN MOUTH AND THROAT WITH OR WITHOUT PRESENCE OF ULCERS  *URINARY PROBLEMS  *BOWEL PROBLEMS  UNUSUAL RASH Items with * indicate a potential emergency and should be followed up as soon as possible.  Feel free to call the clinic you have any questions or concerns. The clinic phone number is [phone].

## 2014-03-05 NOTE — Telephone Encounter (Signed)
gv pt relative appt scheduled for sept thru nov. central will call re ct - pt/relative aware.

## 2014-03-12 ENCOUNTER — Other Ambulatory Visit (HOSPITAL_BASED_OUTPATIENT_CLINIC_OR_DEPARTMENT_OTHER): Payer: Medicare Other

## 2014-03-12 DIAGNOSIS — C3410 Malignant neoplasm of upper lobe, unspecified bronchus or lung: Secondary | ICD-10-CM

## 2014-03-12 DIAGNOSIS — C3411 Malignant neoplasm of upper lobe, right bronchus or lung: Secondary | ICD-10-CM

## 2014-03-12 LAB — CBC WITH DIFFERENTIAL/PLATELET
BASO%: 1 % (ref 0.0–2.0)
Basophils Absolute: 0 10*3/uL (ref 0.0–0.1)
EOS%: 0.1 % (ref 0.0–7.0)
Eosinophils Absolute: 0 10*3/uL (ref 0.0–0.5)
HCT: 29.4 % — ABNORMAL LOW (ref 34.8–46.6)
HGB: 9.6 g/dL — ABNORMAL LOW (ref 11.6–15.9)
LYMPH#: 0.7 10*3/uL — AB (ref 0.9–3.3)
LYMPH%: 17.5 % (ref 14.0–49.7)
MCH: 30.2 pg (ref 25.1–34.0)
MCHC: 32.6 g/dL (ref 31.5–36.0)
MCV: 92.7 fL (ref 79.5–101.0)
MONO#: 0.7 10*3/uL (ref 0.1–0.9)
MONO%: 17.5 % — ABNORMAL HIGH (ref 0.0–14.0)
NEUT%: 63.9 % (ref 38.4–76.8)
NEUTROS ABS: 2.5 10*3/uL (ref 1.5–6.5)
Platelets: 277 10*3/uL (ref 145–400)
RBC: 3.17 10*6/uL — AB (ref 3.70–5.45)
RDW: 18.8 % — AB (ref 11.2–14.5)
WBC: 3.9 10*3/uL (ref 3.9–10.3)

## 2014-03-12 LAB — COMPREHENSIVE METABOLIC PANEL (CC13)
ALBUMIN: 3.2 g/dL — AB (ref 3.5–5.0)
ALT: 13 U/L (ref 0–55)
ANION GAP: 13 meq/L — AB (ref 3–11)
AST: 15 U/L (ref 5–34)
Alkaline Phosphatase: 104 U/L (ref 40–150)
BILIRUBIN TOTAL: 0.27 mg/dL (ref 0.20–1.20)
BUN: 33.8 mg/dL — ABNORMAL HIGH (ref 7.0–26.0)
CHLORIDE: 105 meq/L (ref 98–109)
CO2: 21 mEq/L — ABNORMAL LOW (ref 22–29)
Calcium: 8.8 mg/dL (ref 8.4–10.4)
Creatinine: 1.2 mg/dL — ABNORMAL HIGH (ref 0.6–1.1)
Glucose: 93 mg/dl (ref 70–140)
POTASSIUM: 4.2 meq/L (ref 3.5–5.1)
Sodium: 138 mEq/L (ref 136–145)
TOTAL PROTEIN: 7.2 g/dL (ref 6.4–8.3)

## 2014-03-19 ENCOUNTER — Other Ambulatory Visit (HOSPITAL_BASED_OUTPATIENT_CLINIC_OR_DEPARTMENT_OTHER): Payer: Medicare Other

## 2014-03-19 DIAGNOSIS — C3411 Malignant neoplasm of upper lobe, right bronchus or lung: Secondary | ICD-10-CM

## 2014-03-19 DIAGNOSIS — C3410 Malignant neoplasm of upper lobe, unspecified bronchus or lung: Secondary | ICD-10-CM

## 2014-03-19 LAB — CBC WITH DIFFERENTIAL/PLATELET
BASO%: 0.8 % (ref 0.0–2.0)
Basophils Absolute: 0 10*3/uL (ref 0.0–0.1)
EOS%: 2.2 % (ref 0.0–7.0)
Eosinophils Absolute: 0.1 10*3/uL (ref 0.0–0.5)
HCT: 31 % — ABNORMAL LOW (ref 34.8–46.6)
HGB: 10 g/dL — ABNORMAL LOW (ref 11.6–15.9)
LYMPH#: 1 10*3/uL (ref 0.9–3.3)
LYMPH%: 23.5 % (ref 14.0–49.7)
MCH: 30.6 pg (ref 25.1–34.0)
MCHC: 32.1 g/dL (ref 31.5–36.0)
MCV: 95.2 fL (ref 79.5–101.0)
MONO#: 0.7 10*3/uL (ref 0.1–0.9)
MONO%: 15 % — ABNORMAL HIGH (ref 0.0–14.0)
NEUT#: 2.6 10*3/uL (ref 1.5–6.5)
NEUT%: 58.5 % (ref 38.4–76.8)
Platelets: 113 10*3/uL — ABNORMAL LOW (ref 145–400)
RBC: 3.26 10*6/uL — ABNORMAL LOW (ref 3.70–5.45)
RDW: 21.4 % — ABNORMAL HIGH (ref 11.2–14.5)
WBC: 4.4 10*3/uL (ref 3.9–10.3)

## 2014-03-19 LAB — COMPREHENSIVE METABOLIC PANEL (CC13)
ALT: 16 U/L (ref 0–55)
AST: 17 U/L (ref 5–34)
Albumin: 3.3 g/dL — ABNORMAL LOW (ref 3.5–5.0)
Alkaline Phosphatase: 102 U/L (ref 40–150)
Anion Gap: 10 mEq/L (ref 3–11)
BILIRUBIN TOTAL: 0.35 mg/dL (ref 0.20–1.20)
BUN: 16.3 mg/dL (ref 7.0–26.0)
CHLORIDE: 107 meq/L (ref 98–109)
CO2: 25 mEq/L (ref 22–29)
CREATININE: 1.1 mg/dL (ref 0.6–1.1)
Calcium: 9.5 mg/dL (ref 8.4–10.4)
Glucose: 110 mg/dl (ref 70–140)
Potassium: 3.8 mEq/L (ref 3.5–5.1)
Sodium: 141 mEq/L (ref 136–145)
Total Protein: 7.1 g/dL (ref 6.4–8.3)

## 2014-03-23 ENCOUNTER — Ambulatory Visit (HOSPITAL_COMMUNITY): Payer: Medicare Other

## 2014-03-26 ENCOUNTER — Ambulatory Visit (HOSPITAL_COMMUNITY)
Admission: RE | Admit: 2014-03-26 | Discharge: 2014-03-26 | Disposition: A | Discharge status: Home / Self Care | Payer: Medicare Other | Source: Ambulatory Visit | Attending: Internal Medicine | Admitting: Internal Medicine

## 2014-03-26 ENCOUNTER — Other Ambulatory Visit (HOSPITAL_BASED_OUTPATIENT_CLINIC_OR_DEPARTMENT_OTHER): Payer: Medicare Other

## 2014-03-26 ENCOUNTER — Encounter: Payer: Self-pay | Admitting: Internal Medicine

## 2014-03-26 ENCOUNTER — Encounter (HOSPITAL_COMMUNITY): Payer: Self-pay

## 2014-03-26 ENCOUNTER — Telehealth: Payer: Self-pay | Admitting: Internal Medicine

## 2014-03-26 ENCOUNTER — Ambulatory Visit (HOSPITAL_BASED_OUTPATIENT_CLINIC_OR_DEPARTMENT_OTHER): Payer: Medicare Other

## 2014-03-26 ENCOUNTER — Ambulatory Visit (HOSPITAL_BASED_OUTPATIENT_CLINIC_OR_DEPARTMENT_OTHER): Payer: Medicare Other | Admitting: Internal Medicine

## 2014-03-26 VITALS — BP 130/70 | HR 89 | Temp 98.6°F | Resp 18 | Ht 62.0 in | Wt 250.5 lb

## 2014-03-26 DIAGNOSIS — C3412 Malignant neoplasm of upper lobe, left bronchus or lung: Secondary | ICD-10-CM

## 2014-03-26 DIAGNOSIS — Z923 Personal history of irradiation: Secondary | ICD-10-CM | POA: Diagnosis not present

## 2014-03-26 DIAGNOSIS — Z5111 Encounter for antineoplastic chemotherapy: Secondary | ICD-10-CM

## 2014-03-26 DIAGNOSIS — Z79899 Other long term (current) drug therapy: Secondary | ICD-10-CM | POA: Diagnosis not present

## 2014-03-26 DIAGNOSIS — Z23 Encounter for immunization: Secondary | ICD-10-CM

## 2014-03-26 DIAGNOSIS — C3411 Malignant neoplasm of upper lobe, right bronchus or lung: Secondary | ICD-10-CM

## 2014-03-26 LAB — CBC WITH DIFFERENTIAL/PLATELET
BASO%: 1.2 % (ref 0.0–2.0)
Basophils Absolute: 0.1 10*3/uL (ref 0.0–0.1)
EOS%: 1.9 % (ref 0.0–7.0)
Eosinophils Absolute: 0.1 10*3/uL (ref 0.0–0.5)
HEMATOCRIT: 33.9 % — AB (ref 34.8–46.6)
HGB: 11 g/dL — ABNORMAL LOW (ref 11.6–15.9)
LYMPH#: 1.1 10*3/uL (ref 0.9–3.3)
LYMPH%: 22.5 % (ref 14.0–49.7)
MCH: 31.2 pg (ref 25.1–34.0)
MCHC: 32.4 g/dL (ref 31.5–36.0)
MCV: 96.2 fL (ref 79.5–101.0)
MONO#: 0.9 10*3/uL (ref 0.1–0.9)
MONO%: 19.3 % — ABNORMAL HIGH (ref 0.0–14.0)
NEUT#: 2.6 10*3/uL (ref 1.5–6.5)
NEUT%: 55.1 % (ref 38.4–76.8)
Platelets: 211 10*3/uL (ref 145–400)
RBC: 3.52 10*6/uL — ABNORMAL LOW (ref 3.70–5.45)
RDW: 22.2 % — AB (ref 11.2–14.5)
WBC: 4.7 10*3/uL (ref 3.9–10.3)

## 2014-03-26 LAB — COMPREHENSIVE METABOLIC PANEL (CC13)
ALK PHOS: 117 U/L (ref 40–150)
ALT: 24 U/L (ref 0–55)
AST: 23 U/L (ref 5–34)
Albumin: 3.5 g/dL (ref 3.5–5.0)
Anion Gap: 12 mEq/L — ABNORMAL HIGH (ref 3–11)
BUN: 15.1 mg/dL (ref 7.0–26.0)
CALCIUM: 9.6 mg/dL (ref 8.4–10.4)
CHLORIDE: 107 meq/L (ref 98–109)
CO2: 23 mEq/L (ref 22–29)
CREATININE: 1.2 mg/dL — AB (ref 0.6–1.1)
Glucose: 109 mg/dl (ref 70–140)
Potassium: 3.7 mEq/L (ref 3.5–5.1)
Sodium: 142 mEq/L (ref 136–145)
Total Bilirubin: 0.4 mg/dL (ref 0.20–1.20)
Total Protein: 7.5 g/dL (ref 6.4–8.3)

## 2014-03-26 MED ORDER — SODIUM CHLORIDE 0.9 % IV SOLN
Freq: Once | INTRAVENOUS | Status: AC
  Administered 2014-03-26: 11:00:00 via INTRAVENOUS

## 2014-03-26 MED ORDER — DEXAMETHASONE SODIUM PHOSPHATE 20 MG/5ML IJ SOLN
INTRAMUSCULAR | Status: AC
  Filled 2014-03-26: qty 5

## 2014-03-26 MED ORDER — ONDANSETRON 16 MG/50ML IVPB (CHCC)
16.0000 mg | Freq: Once | INTRAVENOUS | Status: AC
  Administered 2014-03-26: 16 mg via INTRAVENOUS

## 2014-03-26 MED ORDER — IOHEXOL 300 MG/ML  SOLN
80.0000 mL | Freq: Once | INTRAMUSCULAR | Status: AC | PRN
  Administered 2014-03-26: 80 mL via INTRAVENOUS

## 2014-03-26 MED ORDER — ONDANSETRON 16 MG/50ML IVPB (CHCC)
INTRAVENOUS | Status: AC
  Filled 2014-03-26: qty 16

## 2014-03-26 MED ORDER — DEXAMETHASONE SODIUM PHOSPHATE 20 MG/5ML IJ SOLN
20.0000 mg | Freq: Once | INTRAMUSCULAR | Status: AC
  Administered 2014-03-26: 20 mg via INTRAVENOUS

## 2014-03-26 MED ORDER — SODIUM CHLORIDE 0.9 % IV SOLN
350.0000 mg | Freq: Once | INTRAVENOUS | Status: AC
  Administered 2014-03-26: 350 mg via INTRAVENOUS
  Filled 2014-03-26: qty 35

## 2014-03-26 MED ORDER — INFLUENZA VAC SPLIT QUAD 0.5 ML IM SUSY
0.5000 mL | PREFILLED_SYRINGE | Freq: Once | INTRAMUSCULAR | Status: DC
  Administered 2014-03-26: 0.5 mL via INTRAMUSCULAR
  Filled 2014-03-26: qty 0.5

## 2014-03-26 MED ORDER — PEMETREXED DISODIUM CHEMO INJECTION 500 MG
400.0000 mg/m2 | Freq: Once | INTRAVENOUS | Status: AC
  Administered 2014-03-26: 900 mg via INTRAVENOUS
  Filled 2014-03-26: qty 36

## 2014-03-26 NOTE — Patient Instructions (Addendum)
[facility] Discharge Instructions for Patients Receiving Chemotherapy  Today you received the following chemotherapy agents Alimta/Carboplatin.   To help prevent nausea and vomiting after your treatment, we encourage you to take your nausea medication as directed.    If you develop nausea and vomiting that is not controlled by your nausea medication, call the clinic.   BELOW ARE SYMPTOMS THAT SHOULD BE REPORTED IMMEDIATELY:  *FEVER GREATER THAN 100.5 F  *CHILLS WITH OR WITHOUT FEVER  NAUSEA AND VOMITING THAT IS NOT CONTROLLED WITH YOUR NAUSEA MEDICATION  *UNUSUAL SHORTNESS OF BREATH  *UNUSUAL BRUISING OR BLEEDING  TENDERNESS IN MOUTH AND THROAT WITH OR WITHOUT PRESENCE OF ULCERS  *URINARY PROBLEMS  *BOWEL PROBLEMS  UNUSUAL RASH Items with * indicate a potential emergency and should be followed up as soon as possible.  Feel free to call the clinic you have any questions or concerns. The clinic phone number is [phone].   w

## 2014-03-26 NOTE — Progress Notes (Signed)
[facility] [phone]) [phone]   Fax:(336) [phone]  OFFICE PROGRESS NOTE  [name], MD [place] [address]  DIAGNOSIS: Lung cancer  Primary site: Lung (Right)  Staging method: AJCC 7th Edition  Clinical: Stage IIIA (T3, N2, M0) signed by Curt Bears, MD on 01/01/2014 6:57 PM  Summary: Stage IIIA (T3, N2, M0)   PRIOR THERAPY: none   CURRENT THERAPY:  Systemic chemotherapy with carboplatin for an AUC of 4 and Alimta 400 mg/m2 given in 3 weeks. Status post 3 cycles.   DISEASE STAGE:  Lung cancer  Primary site: Lung (Right)  Staging method: AJCC 7th Edition  Clinical: Stage IIIA (T3, N2, M0) signed by Curt Bears, MD on 01/01/2014 6:57 PM  Summary: Stage IIIA (T3, N2, M0)  CHEMOTHERAPY INTENT: palliative  CURRENT # OF CHEMOTHERAPY CYCLES: 4 CURRENT ANTIEMETICS: compazine  CURRENT SMOKING STATUS: current smoker  ORAL CHEMOTHERAPY AND CONSENT: n/a  CURRENT BISPHOSPHONATES USE: none  PAIN MANAGEMENT: hydrocodone  NARCOTICS INDUCED CONSTIPATION: none  LIVING WILL AND CODE STATUS:   INTERVAL HISTORY: Beverly Lewis 80 y.o. female returns to the clinic today for followup visit accompanied by her grandson. The patient tolerated the last cycles of her systemic chemotherapy fairly well with no significant adverse effects except for mild fatigue. She denied having any nausea or vomiting. She has no fever or chills. She denied having any weight loss or night sweats. She has no chest pain but continues to have shortness of breath at baseline and increased with exertion with no cough or hemoptysis. The patient had repeat CT scan of the chest performed earlier today and she is here for evaluation and discussion of her scan results.  MEDICAL HISTORY: Past Medical History  Diagnosis Date  . Hypertension   . Hyperlipidemia   . Cerebrovascular accident history of mild cerebrovascular accident which caused some visual disturbance.    . Diabetes mellitus   . Morbid obesity   . Lung mass     Right upper Lobe  . Shortness of breath   . Pneumonia     hx  . Peripheral vascular disease     hx blood clot 30 yrs ago leg  . GERD (gastroesophageal reflux disease)   . Arthritis   . Pneumothorax, iatrogenic 8/2/113-discharge note    Post Biospy  . Cough with hemoptysis 01/22/12    Prior to discharge  . Tachycardia 01/22/12    Prior to discharge  . History of radiation therapy 02/18/12,02/23/12,02/25/12., 03/01/12,&03/03/12    RULlung 50Gy/5/fx  . Lung cancer 01/21/12    ALLERGIES:  is allergic to asa buff (mag.  MEDICATIONS:  Current Outpatient Prescriptions  Medication Sig Dispense Refill  . allopurinol (ZYLOPRIM) 100 MG tablet Take 100 mg by mouth daily.       Marland Kitchen amLODipine (NORVASC) 10 MG tablet Take 10 mg by mouth daily.        Marland Kitchen aspirin EC 81 MG tablet Take 81 mg by mouth daily.      Marland Kitchen atorvastatin (LIPITOR) 20 MG tablet Take 20 mg by mouth daily.        Marland Kitchen COLCRYS 0.6 MG tablet Take 0.6 mg by mouth 2 (two) times daily as needed. For gout      . dexamethasone (DECADRON) 4 MG tablet Take 1 tab twice a day on the day before, day of, day after chemo.  30 tablet  1  . eprosartan-hydrochlorothiazide (TEVETEN HCT) 600-12.5 MG per tablet Take 1 tablet by  mouth daily.       . Ergocalciferol (VITAMIN D2) 2000 UNITS TABS Take 1 tablet by mouth daily.      . folic acid (FOLVITE) 1 MG tablet Take 1 tablet (1 mg total) by mouth daily.  30 tablet  3  . furosemide (LASIX) 20 MG tablet       . HYDROcodone-acetaminophen (LORCET) 10-650 MG per tablet Take 1 tablet by mouth every 8 (eight) hours as needed for pain.       Marland Kitchen JANUVIA 100 MG tablet Take 100 mg by mouth daily.       Marland Kitchen NEXIUM 40 MG capsule Take 40 mg by mouth daily before breakfast.       . NIASPAN 500 MG CR tablet       . prochlorperazine (COMPAZINE) 10 MG tablet Take 1 tablet (10 mg total) by mouth every 6 (six) hours as needed for nausea or vomiting.  30 tablet  1   Current  Facility-Administered Medications  Medication Dose Route Frequency Provider Last Rate Last Dose  . Influenza vac split quadrivalent PF (FLUARIX) injection 0.5 mL  0.5 mL Intramuscular Once Curt Bears, MD        SURGICAL HISTORY:  Past Surgical History  Procedure Laterality Date  . Abdominal aortic aneurysm repair  06/28/10    Stent Graft repair    . Exploratory laparotomy      For ectopic pregnancy  . Lumbar disc surgery    . Aortogram   03/03/11    with stenting , left external iliac artery  . Appendectomy    . Navigational bronchoscopy      Right Upper Lobe Mass with Biopsies, Brushings, Washings, and bronchoalveolar Lavage:  . Paratracheal adenopathy and bilateral adrenal nodules      REVIEW OF SYSTEMS:  Constitutional: positive for fatigue Eyes: negative Ears, nose, mouth, throat, and face: negative Respiratory: positive for cough and dyspnea on exertion Cardiovascular: negative Gastrointestinal: negative Genitourinary:negative Integument/breast: negative Hematologic/lymphatic: negative Musculoskeletal:negative Neurological: negative Behavioral/Psych: negative Endocrine: negative Allergic/Immunologic: negative   PHYSICAL EXAMINATION: General appearance: alert, cooperative, fatigued and no distress Head: Normocephalic, without obvious abnormality, atraumatic Neck: no adenopathy, no JVD, supple, symmetrical, trachea midline and thyroid not enlarged, symmetric, no tenderness/mass/nodules Lymph nodes: Cervical, supraclavicular, and axillary nodes normal. Resp: clear to auscultation bilaterally Back: symmetric, no curvature. ROM normal. No CVA tenderness. Cardio: regular rate and rhythm, S1, S2 normal, no murmur, click, rub or gallop GI: soft, non-tender; bowel sounds normal; no masses,  no organomegaly Extremities: extremities normal, atraumatic, no cyanosis or edema  ECOG PERFORMANCE STATUS: 2 - Symptomatic, <50% confined to bed  Blood pressure 130/70, pulse 89,  temperature 98.6 F (37 C), temperature source Oral, resp. rate 18, height 5\' 2"  (1.575 m), weight 250 lb 8 oz (113.626 kg).  LABORATORY DATA: Lab Results  Component Value Date   WBC 4.7 03/26/2014   HGB 11.0* 03/26/2014   HCT 33.9* 03/26/2014   MCV 96.2 03/26/2014   PLT 211 03/26/2014      Chemistry      Component Value Date/Time   NA 141 03/19/2014 0920   NA 140 01/20/2012 0840   K 3.8 03/19/2014 0920   K 3.8 01/20/2012 0840   CL 103 01/20/2012 0840   CO2 25 03/19/2014 0920   CO2 24 01/20/2012 0840   BUN 16.3 03/19/2014 0920   BUN 24* 10/23/2013 1118   CREATININE 1.1 03/19/2014 0920   CREATININE 1.10 10/23/2013 1118   CREATININE 1.08 01/20/2012 0840  Component Value Date/Time   CALCIUM 9.5 03/19/2014 0920   CALCIUM 9.4 01/20/2012 0840   ALKPHOS 102 03/19/2014 0920   ALKPHOS 97 01/20/2012 0840   AST 17 03/19/2014 0920   AST 15 01/20/2012 0840   ALT 16 03/19/2014 0920   ALT 13 01/20/2012 0840   BILITOT 0.35 03/19/2014 0920   BILITOT 0.2* 01/20/2012 0840       RADIOGRAPHIC STUDIES: Ct Chest W Contrast  03/26/2014   CLINICAL DATA:  Subsequent evaluation of 80 year old female diagnosed with lung cancer in 2013, treated by radiation therapy (now complete) with ongoing chemotherapy. Shortness of breath.  EXAM: CT CHEST WITH CONTRAST  TECHNIQUE: Multidetector CT imaging of the chest was performed during intravenous contrast administration.  CONTRAST:  40mL OMNIPAQUE IOHEXOL 300 MG/ML  SOLN  COMPARISON:  Chest CT 08/31/2013.  PET-CT 12/08/2013.  FINDINGS: Mediastinum: Heart size is normal. There is no significant pericardial fluid, thickening or pericardial calcification. There is atherosclerosis of the thoracic aorta, the great vessels of the mediastinum and the coronary arteries, including calcified atherosclerotic plaque in the left main, left anterior descending, left circumflex and right coronary arteries. 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 10 mm short axis lower right paratracheal lymph node is unchanged. Esophagus is unremarkable in appearance.  Lungs/Pleura: Compared to the prior examination there has been some decrease in size of the large right upper lobe mass, which currently measures 6.8 x 3.3 cm (image 17 of series 2). There are some surrounding postobstructive changes in the right upper lobe, compatible with a combination of postobstructive pneumonitis and atelectasis. This lesion is immediately above the minor fissure, and causes considerable downward bowing of the minor fissure, with some septal thickening in the underlying right middle lobe, best appreciated on sagittal image 44 of series 603, which could indicate some right middle lobe involvement as well. No new suspicious appearing pulmonary nodules or masses are noted. No pleural effusions.  Upper Abdomen: 1.2 x 1.5 cm low-attenuation lesion in segment 3 of the liver is similar to prior studies, and although incompletely characterized on today's non contrast CT examination, this is favored to represent a small cyst. Extensive atherosclerosis.  Musculoskeletal: There are no aggressive appearing lytic or blastic lesions noted in the visualized portions of the skeleton.  IMPRESSION: 1. Today's study demonstrates a positive response to therapy with slight decreased size of large right upper lobe mass, which currently measures 6.8 x 3.3 cm. Minimally enlarged lower right paratracheal lymph node (previously hypermetabolic on PET-CT 67/89/3810) is unchanged. 2. Atherosclerosis, including left main and 3 vessel coronary artery disease. 3. Additional incidental findings, as above.   Electronically Signed   By: Vinnie Langton M.D.   On: 03/26/2014 10:19   ASSESSMENT AND PLAN: This is a very pleasant 81 years old [place]  American female with unresectable stage IIIA non-small cell lung cancer and the patient is not a candidate for radiotherapy. She is currently undergoing systemic chemotherapy with carboplatin and Alimta status post 3 cycles. She is tolerating her treatment fairly well except for the fatigue. The recent CT scan of the chest showed positive response to therapy with decrease in the size of the large right upper lobe mass. I discussed the scan results with the patient and her grandson.  I recommended for her to proceed with  cycle #4 today as scheduled but reduce the dose of carboplatin to AUC of 4 and Alimta at 400 mg/M2 every 3 weeks. The patient would come back for followup visit in 3 weeks with the start of the next cycle of her chemotherapy. She was advised to call immediately if she has any concerning symptoms in the interval. The patient voices understanding of current disease status and treatment options and is in agreement with the current care plan.  All questions were answered. The patient knows to call the clinic with any problems, questions or concerns. We can certainly see the patient much sooner if necessary.  Disclaimer: This note was dictated with voice recognition software. Similar sounding words can inadvertently be transcribed and may not be corrected upon review.

## 2014-03-26 NOTE — Telephone Encounter (Signed)
gv pt appt schedule for oct and nov.

## 2014-04-02 ENCOUNTER — Other Ambulatory Visit (HOSPITAL_BASED_OUTPATIENT_CLINIC_OR_DEPARTMENT_OTHER): Payer: Medicare Other

## 2014-04-02 DIAGNOSIS — C3411 Malignant neoplasm of upper lobe, right bronchus or lung: Secondary | ICD-10-CM

## 2014-04-02 LAB — COMPREHENSIVE METABOLIC PANEL (CC13)
ALK PHOS: 116 U/L (ref 40–150)
ALT: 20 U/L (ref 0–55)
AST: 22 U/L (ref 5–34)
Albumin: 3.5 g/dL (ref 3.5–5.0)
Anion Gap: 13 mEq/L — ABNORMAL HIGH (ref 3–11)
BUN: 29.4 mg/dL — ABNORMAL HIGH (ref 7.0–26.0)
CO2: 21 mEq/L — ABNORMAL LOW (ref 22–29)
CREATININE: 1.3 mg/dL — AB (ref 0.6–1.1)
Calcium: 9.4 mg/dL (ref 8.4–10.4)
Chloride: 104 mEq/L (ref 98–109)
Glucose: 120 mg/dl (ref 70–140)
Potassium: 3.5 mEq/L (ref 3.5–5.1)
Sodium: 138 mEq/L (ref 136–145)
Total Bilirubin: 0.65 mg/dL (ref 0.20–1.20)
Total Protein: 7.3 g/dL (ref 6.4–8.3)

## 2014-04-02 LAB — CBC WITH DIFFERENTIAL/PLATELET
BASO%: 1.1 % (ref 0.0–2.0)
BASOS ABS: 0 10*3/uL (ref 0.0–0.1)
EOS%: 1.1 % (ref 0.0–7.0)
Eosinophils Absolute: 0 10*3/uL (ref 0.0–0.5)
HEMATOCRIT: 32.3 % — AB (ref 34.8–46.6)
HEMOGLOBIN: 10.6 g/dL — AB (ref 11.6–15.9)
LYMPH%: 48.4 % (ref 14.0–49.7)
MCH: 31.1 pg (ref 25.1–34.0)
MCHC: 32.8 g/dL (ref 31.5–36.0)
MCV: 94.7 fL (ref 79.5–101.0)
MONO#: 0.1 10*3/uL (ref 0.1–0.9)
MONO%: 7.5 % (ref 0.0–14.0)
NEUT#: 0.8 10*3/uL — ABNORMAL LOW (ref 1.5–6.5)
NEUT%: 41.9 % (ref 38.4–76.8)
PLATELETS: 180 10*3/uL (ref 145–400)
RBC: 3.41 10*6/uL — ABNORMAL LOW (ref 3.70–5.45)
RDW: 18.7 % — AB (ref 11.2–14.5)
WBC: 1.9 10*3/uL — ABNORMAL LOW (ref 3.9–10.3)
lymph#: 0.9 10*3/uL (ref 0.9–3.3)

## 2014-04-09 ENCOUNTER — Other Ambulatory Visit (HOSPITAL_BASED_OUTPATIENT_CLINIC_OR_DEPARTMENT_OTHER): Payer: Medicare Other

## 2014-04-09 DIAGNOSIS — C3411 Malignant neoplasm of upper lobe, right bronchus or lung: Secondary | ICD-10-CM

## 2014-04-09 LAB — CBC WITH DIFFERENTIAL/PLATELET
BASO%: 0.4 % (ref 0.0–2.0)
Basophils Absolute: 0 10*3/uL (ref 0.0–0.1)
EOS ABS: 0 10*3/uL (ref 0.0–0.5)
EOS%: 1 % (ref 0.0–7.0)
HCT: 29.8 % — ABNORMAL LOW (ref 34.8–46.6)
HEMOGLOBIN: 9.5 g/dL — AB (ref 11.6–15.9)
LYMPH%: 26.9 % (ref 14.0–49.7)
MCH: 31.7 pg (ref 25.1–34.0)
MCHC: 31.9 g/dL (ref 31.5–36.0)
MCV: 99.3 fL (ref 79.5–101.0)
MONO#: 0.7 10*3/uL (ref 0.1–0.9)
MONO%: 19.7 % — ABNORMAL HIGH (ref 0.0–14.0)
NEUT%: 52 % (ref 38.4–76.8)
NEUTROS ABS: 1.9 10*3/uL (ref 1.5–6.5)
PLATELETS: 87 10*3/uL — AB (ref 145–400)
RBC: 3 10*6/uL — ABNORMAL LOW (ref 3.70–5.45)
RDW: 20.4 % — AB (ref 11.2–14.5)
WBC: 3.6 10*3/uL — ABNORMAL LOW (ref 3.9–10.3)
lymph#: 1 10*3/uL (ref 0.9–3.3)

## 2014-04-09 LAB — COMPREHENSIVE METABOLIC PANEL (CC13)
ALBUMIN: 3.4 g/dL — AB (ref 3.5–5.0)
ALT: 24 U/L (ref 0–55)
ANION GAP: 12 meq/L — AB (ref 3–11)
AST: 22 U/L (ref 5–34)
Alkaline Phosphatase: 104 U/L (ref 40–150)
BUN: 22.1 mg/dL (ref 7.0–26.0)
CO2: 24 meq/L (ref 22–29)
Calcium: 9.3 mg/dL (ref 8.4–10.4)
Chloride: 108 mEq/L (ref 98–109)
Creatinine: 1.4 mg/dL — ABNORMAL HIGH (ref 0.6–1.1)
GLUCOSE: 163 mg/dL — AB (ref 70–140)
POTASSIUM: 3.4 meq/L — AB (ref 3.5–5.1)
SODIUM: 144 meq/L (ref 136–145)
TOTAL PROTEIN: 7 g/dL (ref 6.4–8.3)
Total Bilirubin: 0.27 mg/dL (ref 0.20–1.20)

## 2014-04-10 ENCOUNTER — Telehealth: Payer: Self-pay

## 2014-04-10 NOTE — Telephone Encounter (Signed)
Called and informed pt to increase foods high in potassium that her potassium was slightly low at 3.4. Informed pt of foods high in potassium. Pt verbalized understanding and denies any questions or concerns at this time.

## 2014-04-10 NOTE — Telephone Encounter (Signed)
Message copied by Bevelyn Ngo on Tue Apr 10, 2014 10:03 AM ------      Message from: Carlton Adam      Created: Tue Apr 10, 2014  9:20 AM       Abnormal results, please call and notify patient to increase dietary intake of potassium rich foods ------

## 2014-04-15 ENCOUNTER — Other Ambulatory Visit: Payer: Self-pay | Admitting: Internal Medicine

## 2014-04-16 ENCOUNTER — Telehealth: Payer: Self-pay | Admitting: *Deleted

## 2014-04-16 ENCOUNTER — Encounter: Payer: Self-pay | Admitting: Physician Assistant

## 2014-04-16 ENCOUNTER — Telehealth: Payer: Self-pay | Admitting: Physician Assistant

## 2014-04-16 ENCOUNTER — Other Ambulatory Visit: Payer: Self-pay | Admitting: Physician Assistant

## 2014-04-16 ENCOUNTER — Other Ambulatory Visit (HOSPITAL_BASED_OUTPATIENT_CLINIC_OR_DEPARTMENT_OTHER): Payer: Medicare Other

## 2014-04-16 ENCOUNTER — Ambulatory Visit (HOSPITAL_BASED_OUTPATIENT_CLINIC_OR_DEPARTMENT_OTHER): Payer: Medicare Other

## 2014-04-16 ENCOUNTER — Ambulatory Visit (HOSPITAL_BASED_OUTPATIENT_CLINIC_OR_DEPARTMENT_OTHER): Payer: Medicare Other | Admitting: Physician Assistant

## 2014-04-16 VITALS — BP 130/55 | HR 87 | Temp 98.1°F | Resp 18 | Ht 62.0 in | Wt 250.1 lb

## 2014-04-16 DIAGNOSIS — C3411 Malignant neoplasm of upper lobe, right bronchus or lung: Secondary | ICD-10-CM | POA: Diagnosis not present

## 2014-04-16 DIAGNOSIS — R5383 Other fatigue: Secondary | ICD-10-CM

## 2014-04-16 DIAGNOSIS — C3412 Malignant neoplasm of upper lobe, left bronchus or lung: Secondary | ICD-10-CM

## 2014-04-16 DIAGNOSIS — Z5111 Encounter for antineoplastic chemotherapy: Secondary | ICD-10-CM

## 2014-04-16 DIAGNOSIS — N289 Disorder of kidney and ureter, unspecified: Secondary | ICD-10-CM

## 2014-04-16 LAB — CBC WITH DIFFERENTIAL/PLATELET
BASO%: 1.3 % (ref 0.0–2.0)
Basophils Absolute: 0.1 10*3/uL (ref 0.0–0.1)
EOS ABS: 0 10*3/uL (ref 0.0–0.5)
EOS%: 0.1 % (ref 0.0–7.0)
HCT: 29.2 % — ABNORMAL LOW (ref 34.8–46.6)
HGB: 9.3 g/dL — ABNORMAL LOW (ref 11.6–15.9)
LYMPH#: 1.4 10*3/uL (ref 0.9–3.3)
LYMPH%: 19.4 % (ref 14.0–49.7)
MCH: 32 pg (ref 25.1–34.0)
MCHC: 31.9 g/dL (ref 31.5–36.0)
MCV: 100.2 fL (ref 79.5–101.0)
MONO#: 1.4 10*3/uL — ABNORMAL HIGH (ref 0.1–0.9)
MONO%: 19.4 % — ABNORMAL HIGH (ref 0.0–14.0)
NEUT#: 4.2 10*3/uL (ref 1.5–6.5)
NEUT%: 59.8 % (ref 38.4–76.8)
PLATELETS: 285 10*3/uL (ref 145–400)
RBC: 2.92 10*6/uL — AB (ref 3.70–5.45)
RDW: 22.3 % — AB (ref 11.2–14.5)
WBC: 7 10*3/uL (ref 3.9–10.3)

## 2014-04-16 LAB — COMPREHENSIVE METABOLIC PANEL (CC13)
ALBUMIN: 3.4 g/dL — AB (ref 3.5–5.0)
ALT: 17 U/L (ref 0–55)
ANION GAP: 12 meq/L — AB (ref 3–11)
AST: 17 U/L (ref 5–34)
Alkaline Phosphatase: 124 U/L (ref 40–150)
BILIRUBIN TOTAL: 0.32 mg/dL (ref 0.20–1.20)
BUN: 33.6 mg/dL — ABNORMAL HIGH (ref 7.0–26.0)
CALCIUM: 9.7 mg/dL (ref 8.4–10.4)
CHLORIDE: 105 meq/L (ref 98–109)
CO2: 24 meq/L (ref 22–29)
Creatinine: 1.5 mg/dL — ABNORMAL HIGH (ref 0.6–1.1)
GLUCOSE: 106 mg/dL (ref 70–140)
POTASSIUM: 4.2 meq/L (ref 3.5–5.1)
Sodium: 142 mEq/L (ref 136–145)
TOTAL PROTEIN: 7.5 g/dL (ref 6.4–8.3)

## 2014-04-16 MED ORDER — DEXAMETHASONE SODIUM PHOSPHATE 20 MG/5ML IJ SOLN
INTRAMUSCULAR | Status: AC
  Filled 2014-04-16: qty 5

## 2014-04-16 MED ORDER — SODIUM CHLORIDE 0.9 % IV SOLN
500.0000 mL | INTRAVENOUS | Status: DC
  Administered 2014-04-16: 250 mL via INTRAVENOUS

## 2014-04-16 MED ORDER — ONDANSETRON 16 MG/50ML IVPB (CHCC)
INTRAVENOUS | Status: AC
  Filled 2014-04-16: qty 16

## 2014-04-16 MED ORDER — SODIUM CHLORIDE 0.9 % IV SOLN
Freq: Once | INTRAVENOUS | Status: AC
  Administered 2014-04-16: 11:00:00 via INTRAVENOUS

## 2014-04-16 MED ORDER — DEXAMETHASONE SODIUM PHOSPHATE 20 MG/5ML IJ SOLN
20.0000 mg | Freq: Once | INTRAMUSCULAR | Status: AC
  Administered 2014-04-16: 20 mg via INTRAVENOUS

## 2014-04-16 MED ORDER — ONDANSETRON 16 MG/50ML IVPB (CHCC)
16.0000 mg | Freq: Once | INTRAVENOUS | Status: AC
  Administered 2014-04-16: 16 mg via INTRAVENOUS

## 2014-04-16 MED ORDER — CARBOPLATIN CHEMO INJECTION 450 MG/45ML
324.8000 mg | Freq: Once | INTRAVENOUS | Status: AC
  Administered 2014-04-16: 320 mg via INTRAVENOUS
  Filled 2014-04-16: qty 32

## 2014-04-16 MED ORDER — PEMETREXED DISODIUM CHEMO INJECTION 500 MG
400.0000 mg/m2 | Freq: Once | INTRAVENOUS | Status: AC
  Administered 2014-04-16: 900 mg via INTRAVENOUS
  Filled 2014-04-16: qty 36

## 2014-04-16 MED ORDER — SODIUM CHLORIDE 0.9 % IV SOLN: Freq: Once | INTRAVENOUS | Status: DC

## 2014-04-16 NOTE — Patient Instructions (Signed)
[facility] Discharge Instructions for Patients Receiving Chemotherapy  Today you received the following chemotherapy agents:   To help prevent nausea and vomiting after your treatment, we encourage you to take your nausea medication.  Take it as often as prescribed.     If you develop nausea and vomiting that is not controlled by your nausea medication, call the clinic. If it is after clinic hours your family physician or the after hours number for the clinic or go to the Emergency Department.   BELOW ARE SYMPTOMS THAT SHOULD BE REPORTED IMMEDIATELY:  *FEVER GREATER THAN 100.5 F  *CHILLS WITH OR WITHOUT FEVER  NAUSEA AND VOMITING THAT IS NOT CONTROLLED WITH YOUR NAUSEA MEDICATION  *UNUSUAL SHORTNESS OF BREATH  *UNUSUAL BRUISING OR BLEEDING  TENDERNESS IN MOUTH AND THROAT WITH OR WITHOUT PRESENCE OF ULCERS  *URINARY PROBLEMS  *BOWEL PROBLEMS  UNUSUAL RASH Items with * indicate a potential emergency and should be followed up as soon as possible.  Feel free to call the clinic you have any questions or concerns. The clinic phone number is [phone].   I have been informed and understand all the instructions given to me. I know to contact the clinic, my physician, or go to the Emergency Department if any problems should occur. I do not have any questions at this time, but understand that I may call the clinic during office hours   should I have any questions or need assistance in obtaining follow up care.    __________________________________________  _____________  __________ Signature of Patient or Authorized Representative            Date                   Time    __________________________________________ Nurse's Signature

## 2014-04-16 NOTE — Telephone Encounter (Signed)
Pt confirmed labs/ov per 10/26 POF, sent msg to move chemo down after MD visit, gave pt AVS.... KJ

## 2014-04-16 NOTE — Telephone Encounter (Signed)
I have adjusted 11/16

## 2014-04-16 NOTE — Progress Notes (Signed)
[facility] [phone]   [phone]  OFFICE PROGRESS NOTE  [name], MD [place] [address]  DIAGNOSIS: Lung cancer  Primary site: Lung (Right)  Staging method: AJCC 7th Edition  Clinical: Stage IIIA (T3, N2, M0) signed by Curt Bears, MD on 01/01/2014 6:57 PM  Summary: Stage IIIA (T3, N2, M0)   PRIOR THERAPY: none   CURRENT THERAPY:  Systemic chemotherapy with carboplatin for an AUC of 4 and Alimta 400 mg/m2 given in 3 weeks. Status post 4 cycles.   DISEASE STAGE:  Lung cancer  Primary site: Lung (Right)  Staging method: AJCC 7th Edition  Clinical: Stage IIIA (T3, N2, M0) signed by Curt Bears, MD on 01/01/2014 6:57 PM  Summary: Stage IIIA (T3, N2, M0)  CHEMOTHERAPY INTENT: palliative  CURRENT # OF CHEMOTHERAPY CYCLES: 5 CURRENT ANTIEMETICS: compazine  CURRENT SMOKING STATUS: current smoker  ORAL CHEMOTHERAPY AND CONSENT: n/a  CURRENT BISPHOSPHONATES USE: none  PAIN MANAGEMENT: hydrocodone  NARCOTICS INDUCED CONSTIPATION: none  LIVING WILL AND CODE STATUS:   INTERVAL HISTORY: Beverly Lewis 80 y.o. female returns to the clinic today for followup visit accompanied by her grandson. The patient tolerated the last cycles of her systemic chemotherapy fairly well with no significant adverse effects except for mild fatigue and malaise. She reports her cough has improved. She denied having any nausea or vomiting. She has no fever or chills. She denied having any weight loss or night sweats. She has no chest pain but continues to have shortness of breath at baseline and increased with exertion with no cough or hemoptysis. She presents to proceed with cycle #5.  MEDICAL HISTORY: Past Medical History  Diagnosis Date  . Hypertension   . Hyperlipidemia   . Cerebrovascular accident history of mild cerebrovascular accident which caused some visual disturbance.   . Diabetes mellitus   . Morbid obesity   .  Lung mass     Right upper Lobe  . Shortness of breath   . Pneumonia     hx  . Peripheral vascular disease     hx blood clot 30 yrs ago leg  . GERD (gastroesophageal reflux disease)   . Arthritis   . Pneumothorax, iatrogenic 8/2/113-discharge note    Post Biospy  . Cough with hemoptysis 01/22/12    Prior to discharge  . Tachycardia 01/22/12    Prior to discharge  . History of radiation therapy 02/18/12,02/23/12,02/25/12., 03/01/12,&03/03/12    RULlung 50Gy/5/fx  . Lung cancer 01/21/12    ALLERGIES:  is allergic to asa buff (mag.  MEDICATIONS:  Current Outpatient Prescriptions  Medication Sig Dispense Refill  . allopurinol (ZYLOPRIM) 100 MG tablet Take 100 mg by mouth daily.       Marland Kitchen amLODipine (NORVASC) 10 MG tablet Take 10 mg by mouth daily.        Marland Kitchen aspirin EC 81 MG tablet Take 81 mg by mouth daily.      Marland Kitchen atorvastatin (LIPITOR) 20 MG tablet Take 20 mg by mouth daily.        Marland Kitchen COLCRYS 0.6 MG tablet Take 0.6 mg by mouth 2 (two) times daily as needed. For gout      . dexamethasone (DECADRON) 4 MG tablet Take 1 tab twice a day on the day before, day of, day after chemo.  30 tablet  1  . eprosartan-hydrochlorothiazide (TEVETEN HCT) 600-12.5 MG per tablet Take 1 tablet by mouth daily.       Marland Kitchen  Ergocalciferol (VITAMIN D2) 2000 UNITS TABS Take 1 tablet by mouth daily.      . folic acid (FOLVITE) 1 MG tablet Take 1 tablet (1 mg total) by mouth daily.  30 tablet  3  . furosemide (LASIX) 20 MG tablet       . HYDROcodone-acetaminophen (LORCET) 10-650 MG per tablet Take 1 tablet by mouth every 8 (eight) hours as needed for pain.       Marland Kitchen JANUVIA 100 MG tablet Take 100 mg by mouth daily.       Marland Kitchen NEXIUM 40 MG capsule Take 40 mg by mouth daily before breakfast.       . NIASPAN 500 MG CR tablet       . prochlorperazine (COMPAZINE) 10 MG tablet Take 1 tablet (10 mg total) by mouth every 6 (six) hours as needed for nausea or vomiting.  30 tablet  1   No current facility-administered medications for this  visit.    SURGICAL HISTORY:  Past Surgical History  Procedure Laterality Date  . Abdominal aortic aneurysm repair  06/28/10    Stent Graft repair    . Exploratory laparotomy      For ectopic pregnancy  . Lumbar disc surgery    . Aortogram   03/03/11    with stenting , left external iliac artery  . Appendectomy    . Navigational bronchoscopy      Right Upper Lobe Mass with Biopsies, Brushings, Washings, and bronchoalveolar Lavage:  . Paratracheal adenopathy and bilateral adrenal nodules      REVIEW OF SYSTEMS:  Constitutional: positive for fatigue and malaise Eyes: negative Ears, nose, mouth, throat, and face: negative Respiratory: positive for cough and dyspnea on exertion Cardiovascular: negative Gastrointestinal: negative Genitourinary:negative Integument/breast: negative Hematologic/lymphatic: negative Musculoskeletal:negative Neurological: negative Behavioral/Psych: negative Endocrine: negative Allergic/Immunologic: negative   PHYSICAL EXAMINATION: General appearance: alert, cooperative, fatigued and no distress Head: Normocephalic, without obvious abnormality, atraumatic Neck: no adenopathy, no JVD, supple, symmetrical, trachea midline and thyroid not enlarged, symmetric, no tenderness/mass/nodules Lymph nodes: Cervical, supraclavicular, and axillary nodes normal. Resp: clear to auscultation bilaterally Back: symmetric, no curvature. ROM normal. No CVA tenderness. Cardio: regular rate and rhythm, S1, S2 normal, no murmur, click, rub or gallop GI: soft, non-tender; bowel sounds normal; no masses,  no organomegaly Extremities: edema 1+ pitting edema in bilateral lower extremities  ECOG PERFORMANCE STATUS: 2 - Symptomatic, <50% confined to bed  Blood pressure 130/55, pulse 87, temperature 98.1 F (36.7 C), temperature source Oral, resp. rate 18, height 5\' 2"  (1.575 m), weight 250 lb 1.6 oz (113.445 kg), SpO2 100.00%.  LABORATORY DATA: Lab Results  Component Value  Date   WBC 7.0 04/16/2014   HGB 9.3* 04/16/2014   HCT 29.2* 04/16/2014   MCV 100.2 04/16/2014   PLT 285 04/16/2014      Chemistry      Component Value Date/Time   NA 142 04/16/2014 0916   NA 140 01/20/2012 0840   K 4.2 04/16/2014 0916   K 3.8 01/20/2012 0840   CL 103 01/20/2012 0840   CO2 24 04/16/2014 0916   CO2 24 01/20/2012 0840   BUN 33.6* 04/16/2014 0916   BUN 24* 10/23/2013 1118   CREATININE 1.5* 04/16/2014 0916   CREATININE 1.10 10/23/2013 1118   CREATININE 1.08 01/20/2012 0840      Component Value Date/Time   CALCIUM 9.7 04/16/2014 0916   CALCIUM 9.4 01/20/2012 0840   ALKPHOS 124 04/16/2014 0916   ALKPHOS 97 01/20/2012 0840   AST 17  04/16/2014 0916   AST 15 01/20/2012 0840   ALT 17 04/16/2014 0916   ALT 13 01/20/2012 0840   BILITOT 0.32 04/16/2014 0916   BILITOT 0.2* 01/20/2012 0840       RADIOGRAPHIC STUDIES: Ct Chest W Contrast  03/26/2014   CLINICAL DATA:  Subsequent evaluation of 81 year old female diagnosed with lung cancer in 2013, treated by radiation therapy (now complete) with ongoing chemotherapy. Shortness of breath.  EXAM: CT CHEST WITH CONTRAST  TECHNIQUE: Multidetector CT imaging of the chest was performed during intravenous contrast administration.  CONTRAST:  10mL OMNIPAQUE IOHEXOL 300 MG/ML  SOLN  COMPARISON:  Chest CT 08/31/2013.  PET-CT 12/08/2013.  FINDINGS: Mediastinum: Heart size is normal. There is no significant pericardial fluid, thickening or pericardial calcification. There is atherosclerosis of the thoracic aorta, the great vessels of the mediastinum and the coronary arteries, including calcified atherosclerotic plaque in the left main, left anterior descending, left circumflex and right coronary arteries. 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 10 mm short axis lower right paratracheal lymph node is unchanged. Esophagus is unremarkable in appearance.  Lungs/Pleura: Compared to the prior examination there has been some decrease in size of the large right upper lobe mass, which currently measures 6.8 x 3.3 cm (image 17 of series 2). There are some surrounding postobstructive changes in the right upper lobe, compatible with a combination of postobstructive pneumonitis and atelectasis. This lesion is immediately above the minor fissure, and causes considerable downward bowing of the minor fissure, with some septal thickening in the underlying right middle lobe, best appreciated on sagittal image 44 of series 603, which could indicate some right middle lobe involvement as well. No new suspicious appearing pulmonary nodules or masses are noted. No pleural effusions.  Upper Abdomen: 1.2 x 1.5 cm low-attenuation lesion in segment 3 of the liver is similar to prior studies, and although incompletely characterized on today's non contrast CT examination, this is favored to represent a small cyst. Extensive atherosclerosis.  Musculoskeletal: There are no aggressive appearing lytic or blastic lesions noted in the visualized portions of the skeleton.  IMPRESSION: 1. Today's study demonstrates a positive response to therapy with slight decreased size of large right upper lobe mass, which currently measures 6.8 x 3.3 cm. Minimally enlarged lower right paratracheal lymph node (previously hypermetabolic on PET-CT 25/85/2778) is unchanged. 2. Atherosclerosis, including left main and 3 vessel coronary artery disease. 3. Additional incidental findings, as above.   Electronically Signed   By: Vinnie Langton M.D.   On: 03/26/2014 10:19   ASSESSMENT AND PLAN: This is a very pleasant 80 years old [place] American female with unresectable stage IIIA non-small cell lung cancer and the patient is not a candidate for radiotherapy. She is currently undergoing  systemic chemotherapy with carboplatin and Alimta status post 4 cycles. She is tolerating her treatment fairly well except for the fatigue. The recent CT scan of the chest showed positive response to therapy with decrease in the size of the large right upper lobe mass. Her counts up in reviewed and she will proceed with cycle #5 today as scheduled with dose reduced carboplatin for an before meals of 4 and Alimta at 400 mg/m given every 3 weeks as it was starting with cycle #4. She will continue with weekly labs as scheduled. She'll  follow up in 3 weeks prior to the start of cycle #6. She will be due for another restaging CT scan after she completes cycle #6.   She was advised to call immediately if she has any concerning symptoms in the interval. The patient voices understanding of current disease status and treatment options and is in agreement with the current care plan.  All questions were answered. The patient knows to call the clinic with any problems, questions or concerns. We can certainly see the patient much sooner if necessary.  Carlton Adam, PA-C 04/16/2014   Disclaimer: This note was dictated with voice recognition software. Similar sounding words can inadvertently be transcribed and may not be corrected upon review.

## 2014-04-16 NOTE — Patient Instructions (Signed)
Continue weekly labs as scheduled Follow-up in 3 weeks for another symptom management visit prior to your next scheduled cycle of chemotherapy

## 2014-04-23 ENCOUNTER — Other Ambulatory Visit: Payer: Medicare Other

## 2014-04-24 ENCOUNTER — Telehealth: Payer: Self-pay | Admitting: *Deleted

## 2014-04-24 NOTE — Telephone Encounter (Signed)
Beverly Lewis's son Beverly Lewis called stating that Beverly Lewis is having LE swelling and she has a sore throat.  No fever, no sores in the mouth or any s/s thrush.  Per Dr Vista Mink, Beverly Lewis needs to come to lab appt as scheduled tomorrow to see if she is neutropenic, continue lasix as prescribed by her MD, Dr Vista Mink does not feel that is r/t her chemo. Tried to call Beverly Lewis's son back, he was not available, but spoke with patient, she verbalized understanding.  Will call her with lab results tomorrow.

## 2014-04-25 ENCOUNTER — Other Ambulatory Visit (HOSPITAL_BASED_OUTPATIENT_CLINIC_OR_DEPARTMENT_OTHER): Payer: Medicare Other

## 2014-04-25 ENCOUNTER — Telehealth: Payer: Self-pay | Admitting: *Deleted

## 2014-04-25 DIAGNOSIS — C3411 Malignant neoplasm of upper lobe, right bronchus or lung: Secondary | ICD-10-CM

## 2014-04-25 LAB — CBC WITH DIFFERENTIAL/PLATELET
BASO%: 0.2 % (ref 0.0–2.0)
Basophils Absolute: 0 10*3/uL (ref 0.0–0.1)
EOS%: 1.2 % (ref 0.0–7.0)
Eosinophils Absolute: 0 10*3/uL (ref 0.0–0.5)
HEMATOCRIT: 27.4 % — AB (ref 34.8–46.6)
HGB: 8.9 g/dL — ABNORMAL LOW (ref 11.6–15.9)
LYMPH#: 1.1 10*3/uL (ref 0.9–3.3)
LYMPH%: 40.1 % (ref 14.0–49.7)
MCH: 32.6 pg (ref 25.1–34.0)
MCHC: 32.4 g/dL (ref 31.5–36.0)
MCV: 100.6 fL (ref 79.5–101.0)
MONO#: 0.4 10*3/uL (ref 0.1–0.9)
MONO%: 14.9 % — ABNORMAL HIGH (ref 0.0–14.0)
NEUT#: 1.2 10*3/uL — ABNORMAL LOW (ref 1.5–6.5)
NEUT%: 43.6 % (ref 38.4–76.8)
Platelets: 232 10*3/uL (ref 145–400)
RBC: 2.73 10*6/uL — AB (ref 3.70–5.45)
RDW: 20.3 % — ABNORMAL HIGH (ref 11.2–14.5)
WBC: 2.8 10*3/uL — AB (ref 3.9–10.3)

## 2014-04-25 LAB — COMPREHENSIVE METABOLIC PANEL (CC13)
ALT: 17 U/L (ref 0–55)
AST: 21 U/L (ref 5–34)
Albumin: 3.5 g/dL (ref 3.5–5.0)
Alkaline Phosphatase: 121 U/L (ref 40–150)
Anion Gap: 14 mEq/L — ABNORMAL HIGH (ref 3–11)
BILIRUBIN TOTAL: 0.49 mg/dL (ref 0.20–1.20)
BUN: 34.1 mg/dL — ABNORMAL HIGH (ref 7.0–26.0)
CHLORIDE: 102 meq/L (ref 98–109)
CO2: 24 mEq/L (ref 22–29)
CREATININE: 1.6 mg/dL — AB (ref 0.6–1.1)
Calcium: 9.5 mg/dL (ref 8.4–10.4)
Glucose: 97 mg/dl (ref 70–140)
Potassium: 3.6 mEq/L (ref 3.5–5.1)
Sodium: 139 mEq/L (ref 136–145)
Total Protein: 7.7 g/dL (ref 6.4–8.3)

## 2014-04-25 NOTE — Telephone Encounter (Signed)
Pt's son called stating pt had a question, attempted to call back, no answer unable to leave msg.

## 2014-04-25 NOTE — Telephone Encounter (Signed)
Called and spoke to [place],  [place] reviewed with Dr Vista Mink.  Per Dr Vista Mink, continue to watch pt and will not call in an antibiotic at this time for her sore throat because it may be viral in nature.  Advised she call if she does develop any fever or new congestion.  [name] verbalized understanding.

## 2014-04-30 ENCOUNTER — Other Ambulatory Visit: Payer: Medicare Other

## 2014-05-03 ENCOUNTER — Ambulatory Visit
Admission: RE | Admit: 2014-05-03 | Discharge: 2014-05-03 | Disposition: A | Discharge status: Home / Self Care | Payer: Medicare Other | Source: Ambulatory Visit | Attending: Radiation Oncology | Admitting: Radiation Oncology

## 2014-05-03 ENCOUNTER — Encounter: Payer: Self-pay | Admitting: Radiation Oncology

## 2014-05-03 VITALS — BP 104/85 | HR 101 | Temp 98.9°F | Resp 20

## 2014-05-03 DIAGNOSIS — C3411 Malignant neoplasm of upper lobe, right bronchus or lung: Secondary | ICD-10-CM

## 2014-05-03 NOTE — Progress Notes (Signed)
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  She has been receiving carbo/alimpta and tolerating that well with some dose reductions due to fatigue.  She has some hearing loss and an earache. She sees Dr. Richardean Sale for this and needs to make an appointment. She denies fevers or chills. She is still smoking a few cigarettes a day. Her breathing is stable. She is accompanied by her grandson.   Allergies:  Allergies  Allergen Reactions  . Asa Buff (Mag [Buffered Aspirin]     In tolerance in higher dosages.    Medications:  Current Outpatient Prescriptions  Medication Sig Dispense Refill  . allopurinol (ZYLOPRIM) 100 MG tablet Take 100 mg by mouth daily.     Marland Kitchen amLODipine (NORVASC) 10 MG tablet Take 10 mg by mouth daily.      Marland Kitchen aspirin EC 81 MG tablet Take 81 mg by mouth daily.    Marland Kitchen atorvastatin (LIPITOR) 20 MG tablet Take 20 mg by mouth daily.      Marland Kitchen COLCRYS 0.6 MG tablet Take 0.6 mg by mouth 2 (two) times daily as needed. For gout    . dexamethasone (DECADRON) 4 MG tablet Take 1 tab twice a day on the day before, day of, day after chemo. 30 tablet 1  . eprosartan-hydrochlorothiazide (TEVETEN HCT) 600-12.5 MG per tablet Take 1 tablet by mouth daily.     . Ergocalciferol (VITAMIN D2) 2000 UNITS TABS Take 1 tablet by mouth daily.    . folic acid (FOLVITE) 1 MG tablet Take 1 tablet (1 mg total) by mouth daily. 30 tablet 3  . furosemide (LASIX) 20 MG tablet Take 20 mg by mouth daily.     Marland Kitchen HYDROcodone-acetaminophen (LORCET) 10-650 MG per tablet Take 1 tablet by mouth every 8 (eight) hours as needed for pain.     Marland Kitchen JANUVIA 100 MG tablet Take 100  mg by mouth daily.     . megestrol (MEGACE) 20 MG tablet Take 20 mg by mouth daily.    Marland Kitchen NEXIUM 40 MG capsule Take 40 mg by mouth daily before breakfast.     . NIASPAN 500 MG CR tablet Take 500 mg by mouth at bedtime.     . prochlorperazine (COMPAZINE) 10 MG tablet Take 1 tablet (10 mg total) by mouth every 6 (six) hours as needed for nausea or vomiting. 30 tablet 1  . fluticasone (FLONASE) 50 MCG/ACT nasal spray      No current facility-administered medications for this encounter.    Physical Exam:  Filed Vitals:   05/03/14 0818  BP: 104/85  Pulse: 101  Temp: 98.9 F (37.2 C)  TempSrc: Oral  Resp: 20  SpO2: 100%   pleasant obese female sitting in a wheelchair.  IMPRESSION: Chrishonda is a 80 y.o. female status post SBRT to a right upper lobe lesion with progressive/recurrent disease responding to chemo.   PLAN:  She is looking good and tolerating chemo well with few side effects with a great response.  I recommended she continue with chemotherapy as our chance for cure/salvage remains very low. I discussed with her treatment intent was palliative in nature and we  went over her scans showing the treatment response.  I have not scheduled follow up with me but would be happy to see her back with any questions. I encouraged her to make an appointment with Dr. Gershon Crane and discuss her hearing loss with Dr. Julien Nordmann as well.    Thea Silversmith, MD

## 2014-05-03 NOTE — Progress Notes (Signed)
Follow up s/p Lung rad  txs 2 years, last CT Chest done 03/26/14, patient c/o loss hearing when answering telephone, her last chemotherapy is this Monday in med/onc, fatigued, takes megace to help appetite, says only when I take the chemo I feel bad by the 3rd day,", sometimes constipation and diarrhea,  Appetite okay right now , too shaky to get weighed this am, 100% room air sats no pain stated at present 8:18 AM

## 2014-05-07 ENCOUNTER — Ambulatory Visit (HOSPITAL_BASED_OUTPATIENT_CLINIC_OR_DEPARTMENT_OTHER): Payer: Medicare Other

## 2014-05-07 ENCOUNTER — Ambulatory Visit (HOSPITAL_BASED_OUTPATIENT_CLINIC_OR_DEPARTMENT_OTHER): Payer: Medicare Other | Admitting: Internal Medicine

## 2014-05-07 ENCOUNTER — Encounter: Payer: Self-pay | Admitting: Internal Medicine

## 2014-05-07 ENCOUNTER — Telehealth: Payer: Self-pay | Admitting: Internal Medicine

## 2014-05-07 ENCOUNTER — Other Ambulatory Visit (HOSPITAL_BASED_OUTPATIENT_CLINIC_OR_DEPARTMENT_OTHER): Payer: Medicare Other

## 2014-05-07 VITALS — BP 133/59 | HR 114 | Temp 98.7°F | Resp 18 | Ht 62.0 in | Wt 246.6 lb

## 2014-05-07 DIAGNOSIS — C3411 Malignant neoplasm of upper lobe, right bronchus or lung: Secondary | ICD-10-CM

## 2014-05-07 DIAGNOSIS — Z5111 Encounter for antineoplastic chemotherapy: Secondary | ICD-10-CM

## 2014-05-07 DIAGNOSIS — C3412 Malignant neoplasm of upper lobe, left bronchus or lung: Secondary | ICD-10-CM

## 2014-05-07 DIAGNOSIS — R5383 Other fatigue: Secondary | ICD-10-CM

## 2014-05-07 LAB — COMPREHENSIVE METABOLIC PANEL (CC13)
ALT: 14 U/L (ref 0–55)
AST: 17 U/L (ref 5–34)
Albumin: 3.5 g/dL (ref 3.5–5.0)
Alkaline Phosphatase: 129 U/L (ref 40–150)
Anion Gap: 12 mEq/L — ABNORMAL HIGH (ref 3–11)
BILIRUBIN TOTAL: 0.31 mg/dL (ref 0.20–1.20)
BUN: 37.6 mg/dL — AB (ref 7.0–26.0)
CALCIUM: 9.3 mg/dL (ref 8.4–10.4)
CO2: 20 mEq/L — ABNORMAL LOW (ref 22–29)
CREATININE: 1.7 mg/dL — AB (ref 0.6–1.1)
Chloride: 108 mEq/L (ref 98–109)
Glucose: 137 mg/dl (ref 70–140)
Potassium: 3.6 mEq/L (ref 3.5–5.1)
Sodium: 140 mEq/L (ref 136–145)
Total Protein: 7.6 g/dL (ref 6.4–8.3)

## 2014-05-07 LAB — CBC WITH DIFFERENTIAL/PLATELET
BASO%: 0.2 % (ref 0.0–2.0)
BASOS ABS: 0 10*3/uL (ref 0.0–0.1)
EOS%: 1.6 % (ref 0.0–7.0)
Eosinophils Absolute: 0.1 10*3/uL (ref 0.0–0.5)
HCT: 30.4 % — ABNORMAL LOW (ref 34.8–46.6)
HEMOGLOBIN: 9.8 g/dL — AB (ref 11.6–15.9)
LYMPH%: 22.2 % (ref 14.0–49.7)
MCH: 33.6 pg (ref 25.1–34.0)
MCHC: 32.1 g/dL (ref 31.5–36.0)
MCV: 104.7 fL — AB (ref 79.5–101.0)
MONO#: 1 10*3/uL — ABNORMAL HIGH (ref 0.1–0.9)
MONO%: 15.6 % — AB (ref 0.0–14.0)
NEUT#: 3.9 10*3/uL (ref 1.5–6.5)
NEUT%: 60.4 % (ref 38.4–76.8)
Platelets: 320 10*3/uL (ref 145–400)
RBC: 2.9 10*6/uL — ABNORMAL LOW (ref 3.70–5.45)
RDW: 23 % — ABNORMAL HIGH (ref 11.2–14.5)
WBC: 6.5 10*3/uL (ref 3.9–10.3)
lymph#: 1.4 10*3/uL (ref 0.9–3.3)

## 2014-05-07 MED ORDER — DEXAMETHASONE SODIUM PHOSPHATE 20 MG/5ML IJ SOLN
INTRAMUSCULAR | Status: AC
  Filled 2014-05-07: qty 5

## 2014-05-07 MED ORDER — CYANOCOBALAMIN 1000 MCG/ML IJ SOLN
1000.0000 ug | Freq: Once | INTRAMUSCULAR | Status: AC
  Administered 2014-05-07: 1000 ug via INTRAMUSCULAR

## 2014-05-07 MED ORDER — DEXAMETHASONE SODIUM PHOSPHATE 20 MG/5ML IJ SOLN
20.0000 mg | Freq: Once | INTRAMUSCULAR | Status: AC
  Administered 2014-05-07: 20 mg via INTRAVENOUS

## 2014-05-07 MED ORDER — SODIUM CHLORIDE 0.9 % IV SOLN
400.0000 mg/m2 | Freq: Once | INTRAVENOUS | Status: AC
  Administered 2014-05-07: 900 mg via INTRAVENOUS
  Filled 2014-05-07: qty 36

## 2014-05-07 MED ORDER — ONDANSETRON 16 MG/50ML IVPB (CHCC)
16.0000 mg | Freq: Once | INTRAVENOUS | Status: AC
  Administered 2014-05-07: 16 mg via INTRAVENOUS

## 2014-05-07 MED ORDER — ONDANSETRON 16 MG/50ML IVPB (CHCC)
INTRAVENOUS | Status: AC
  Filled 2014-05-07: qty 16

## 2014-05-07 MED ORDER — CARBOPLATIN CHEMO INJECTION 450 MG/45ML
296.8000 mg | Freq: Once | INTRAVENOUS | Status: AC
  Administered 2014-05-07: 300 mg via INTRAVENOUS
  Filled 2014-05-07: qty 30

## 2014-05-07 MED ORDER — SODIUM CHLORIDE 0.9 % IV SOLN
Freq: Once | INTRAVENOUS | Status: AC
  Administered 2014-05-07: 11:00:00 via INTRAVENOUS

## 2014-05-07 MED ORDER — CYANOCOBALAMIN 1000 MCG/ML IJ SOLN
INTRAMUSCULAR | Status: AC
  Filled 2014-05-07: qty 1

## 2014-05-07 NOTE — Progress Notes (Signed)
Per Dr.Mohamed, proceed with treatment, aware of CMET

## 2014-05-07 NOTE — Progress Notes (Signed)
[facility] [phone]) [phone]   [phone]) [phone]  OFFICE PROGRESS NOTE  [name], MD [place] [address]  DIAGNOSIS: Lung cancer  Primary site: Lung (Right)  Staging method: AJCC 7th Edition  Clinical: Stage IIIA (T3, N2, M0) signed by Curt Bears, MD on 01/01/2014 6:57 PM  Summary: Stage IIIA (T3, N2, M0)   PRIOR THERAPY: none   CURRENT THERAPY:  Systemic chemotherapy with carboplatin for an AUC of 4 and Alimta 400 mg/m2 given in 3 weeks. Status post 5 cycles.   DISEASE STAGE:  Lung cancer  Primary site: Lung (Right)  Staging method: AJCC 7th Edition  Clinical: Stage IIIA (T3, N2, M0) signed by Curt Bears, MD on 01/01/2014 6:57 PM  Summary: Stage IIIA (T3, N2, M0)  CHEMOTHERAPY INTENT: palliative  CURRENT # OF CHEMOTHERAPY CYCLES: 6 CURRENT ANTIEMETICS: compazine  CURRENT SMOKING STATUS: current smoker  ORAL CHEMOTHERAPY AND CONSENT: n/a  CURRENT BISPHOSPHONATES USE: none  PAIN MANAGEMENT: hydrocodone  NARCOTICS INDUCED CONSTIPATION: none  LIVING WILL AND CODE STATUS:   INTERVAL HISTORY: Beverly Lewis 80 y.o. female returns to the clinic today for followup visit accompanied by her grandson. The patient tolerated the last cycles of her systemic chemotherapy fairly well with no significant adverse effects except for mild fatigue and some hearing deficit. She denied having any nausea or vomiting. She has no fever or chills. She denied having any weight loss or night sweats. She has no chest pain but continues to have shortness of breath at baseline and increased with exertion with no cough or hemoptysis. She is here today to start cycle #6 of her chemotherapy.  MEDICAL HISTORY: Past Medical History  Diagnosis Date  . Hypertension   . Hyperlipidemia   . Cerebrovascular accident history of mild cerebrovascular accident which caused some visual disturbance.   . Diabetes mellitus   . Morbid obesity   . Lung  mass     Right upper Lobe  . Shortness of breath   . Pneumonia     hx  . Peripheral vascular disease     hx blood clot 30 yrs ago leg  . GERD (gastroesophageal reflux disease)   . Arthritis   . Pneumothorax, iatrogenic 8/2/113-discharge note    Post Biospy  . Cough with hemoptysis 01/22/12    Prior to discharge  . Tachycardia 01/22/12    Prior to discharge  . History of radiation therapy 02/18/12,02/23/12,02/25/12., 03/01/12,&03/03/12    RULlung 50Gy/5/fx  . Lung cancer 01/21/12    ALLERGIES:  is allergic to asa buff (mag.  MEDICATIONS:  Current Outpatient Prescriptions  Medication Sig Dispense Refill  . allopurinol (ZYLOPRIM) 100 MG tablet Take 100 mg by mouth daily.     Marland Kitchen amLODipine (NORVASC) 10 MG tablet Take 10 mg by mouth daily.      Marland Kitchen aspirin EC 81 MG tablet Take 81 mg by mouth daily.    Marland Kitchen atorvastatin (LIPITOR) 20 MG tablet Take 20 mg by mouth daily.      Marland Kitchen COLCRYS 0.6 MG tablet Take 0.6 mg by mouth 2 (two) times daily as needed. For gout    . dexamethasone (DECADRON) 4 MG tablet Take 1 tab twice a day on the day before, day of, day after chemo. 30 tablet 1  . eprosartan-hydrochlorothiazide (TEVETEN HCT) 600-12.5 MG per tablet Take 1 tablet by mouth daily.     . Ergocalciferol (VITAMIN D2) 2000 UNITS TABS Take 1 tablet by mouth daily.    Marland Kitchen  fluticasone (FLONASE) 50 MCG/ACT nasal spray     . folic acid (FOLVITE) 1 MG tablet Take 1 tablet (1 mg total) by mouth daily. 30 tablet 3  . furosemide (LASIX) 20 MG tablet Take 20 mg by mouth daily.     Marland Kitchen HYDROcodone-acetaminophen (LORCET) 10-650 MG per tablet Take 1 tablet by mouth every 8 (eight) hours as needed for pain.     Marland Kitchen JANUVIA 100 MG tablet Take 100 mg by mouth daily.     . megestrol (MEGACE) 20 MG tablet Take 20 mg by mouth daily.    Marland Kitchen NEXIUM 40 MG capsule Take 40 mg by mouth daily before breakfast.     . NIASPAN 500 MG CR tablet Take 500 mg by mouth at bedtime.     . prochlorperazine (COMPAZINE) 10 MG tablet Take 1 tablet (10 mg  total) by mouth every 6 (six) hours as needed for nausea or vomiting. 30 tablet 1   No current facility-administered medications for this visit.    SURGICAL HISTORY:  Past Surgical History  Procedure Laterality Date  . Abdominal aortic aneurysm repair  06/28/10    Stent Graft repair    . Exploratory laparotomy      For ectopic pregnancy  . Lumbar disc surgery    . Aortogram   03/03/11    with stenting , left external iliac artery  . Appendectomy    . Navigational bronchoscopy      Right Upper Lobe Mass with Biopsies, Brushings, Washings, and bronchoalveolar Lavage:  . Paratracheal adenopathy and bilateral adrenal nodules      REVIEW OF SYSTEMS:  Constitutional: positive for fatigue Eyes: negative Ears, nose, mouth, throat, and face: negative Respiratory: positive for cough and dyspnea on exertion Cardiovascular: negative Gastrointestinal: negative Genitourinary:negative Integument/breast: negative Hematologic/lymphatic: negative Musculoskeletal:negative Neurological: negative Behavioral/Psych: negative Endocrine: negative Allergic/Immunologic: negative   PHYSICAL EXAMINATION: General appearance: alert, cooperative, fatigued and no distress Head: Normocephalic, without obvious abnormality, atraumatic Neck: no adenopathy, no JVD, supple, symmetrical, trachea midline and thyroid not enlarged, symmetric, no tenderness/mass/nodules Lymph nodes: Cervical, supraclavicular, and axillary nodes normal. Resp: clear to auscultation bilaterally Back: symmetric, no curvature. ROM normal. No CVA tenderness. Cardio: regular rate and rhythm, S1, S2 normal, no murmur, click, rub or gallop GI: soft, non-tender; bowel sounds normal; no masses,  no organomegaly Extremities: extremities normal, atraumatic, no cyanosis or edema  ECOG PERFORMANCE STATUS: 2 - Symptomatic, <50% confined to bed  Blood pressure 133/59, pulse 114, temperature 98.7 F (37.1 C), temperature source Oral, resp. rate  18, height 5\' 2"  (1.575 m), weight 246 lb 9.6 oz (111.857 kg), SpO2 100 %.  LABORATORY DATA: Lab Results  Component Value Date   WBC 6.5 05/07/2014   HGB 9.8* 05/07/2014   HCT 30.4* 05/07/2014   MCV 104.7* 05/07/2014   PLT 320 05/07/2014      Chemistry      Component Value Date/Time   NA 139 04/25/2014 1011   NA 140 01/20/2012 0840   K 3.6 04/25/2014 1011   K 3.8 01/20/2012 0840   CL 103 01/20/2012 0840   CO2 24 04/25/2014 1011   CO2 24 01/20/2012 0840   BUN 34.1* 04/25/2014 1011   BUN 24* 10/23/2013 1118   CREATININE 1.6* 04/25/2014 1011   CREATININE 1.10 10/23/2013 1118   CREATININE 1.08 01/20/2012 0840      Component Value Date/Time   CALCIUM 9.5 04/25/2014 1011   CALCIUM 9.4 01/20/2012 0840   ALKPHOS 121 04/25/2014 1011   ALKPHOS 97 01/20/2012  0840   AST 21 04/25/2014 1011   AST 15 01/20/2012 0840   ALT 17 04/25/2014 1011   ALT 13 01/20/2012 0840   BILITOT 0.49 04/25/2014 1011   BILITOT 0.2* 01/20/2012 0840       RADIOGRAPHIC STUDIES: No results found. ASSESSMENT AND PLAN: This is a very pleasant 80 years old [place] American female with unresectable stage IIIA non-small cell lung cancer and the patient is not a candidate for radiotherapy. She is currently undergoing systemic chemotherapy with carboplatin and Alimta status post 5 cycles. She is tolerating her treatment fairly well except for the fatigue. I recommended for her to continue her current treatment with reduced dose carboplatin and Alimta. She would come back for follow-up visit in 3 weeks after repeating CT scan of the chest for restaging of her disease. She was advised to call immediately if she has any concerning symptoms in the interval. The patient voices understanding of current disease status and treatment options and is in agreement with the current care plan.  All questions were answered. The patient knows to call the clinic with any problems, questions or concerns. We can certainly see the  patient much sooner if necessary.  Disclaimer: This note was dictated with voice recognition software. Similar sounding words can inadvertently be transcribed and may not be corrected upon review.

## 2014-05-07 NOTE — Patient Instructions (Signed)
[facility] Discharge Instructions for Patients Receiving Chemotherapy  Today you received the following chemotherapy agents Alimta and Carboplatin  To help prevent nausea and vomiting after your treatment, we encourage you to take your nausea medication as prescribed   If you develop nausea and vomiting that is not controlled by your nausea medication, call the clinic.   BELOW ARE SYMPTOMS THAT SHOULD BE REPORTED IMMEDIATELY:  *FEVER GREATER THAN 100.5 F  *CHILLS WITH OR WITHOUT FEVER  NAUSEA AND VOMITING THAT IS NOT CONTROLLED WITH YOUR NAUSEA MEDICATION  *UNUSUAL SHORTNESS OF BREATH  *UNUSUAL BRUISING OR BLEEDING  TENDERNESS IN MOUTH AND THROAT WITH OR WITHOUT PRESENCE OF ULCERS  *URINARY PROBLEMS  *BOWEL PROBLEMS  UNUSUAL RASH Items with * indicate a potential emergency and should be followed up as soon as possible.  Feel free to call the clinic you have any questions or concerns. The clinic phone number is [phone].

## 2014-05-07 NOTE — Telephone Encounter (Signed)
Pt confirmed labs/ov per 11/16 POF, gave pt AVS.... KJ °

## 2014-05-14 ENCOUNTER — Other Ambulatory Visit (HOSPITAL_BASED_OUTPATIENT_CLINIC_OR_DEPARTMENT_OTHER): Payer: Medicare Other

## 2014-05-14 DIAGNOSIS — C3411 Malignant neoplasm of upper lobe, right bronchus or lung: Secondary | ICD-10-CM

## 2014-05-14 DIAGNOSIS — C3412 Malignant neoplasm of upper lobe, left bronchus or lung: Secondary | ICD-10-CM

## 2014-05-14 LAB — COMPREHENSIVE METABOLIC PANEL (CC13)
ALT: 15 U/L (ref 0–55)
ANION GAP: 13 meq/L — AB (ref 3–11)
AST: 18 U/L (ref 5–34)
Albumin: 3.4 g/dL — ABNORMAL LOW (ref 3.5–5.0)
Alkaline Phosphatase: 120 U/L (ref 40–150)
BILIRUBIN TOTAL: 0.57 mg/dL (ref 0.20–1.20)
BUN: 30.4 mg/dL — ABNORMAL HIGH (ref 7.0–26.0)
CO2: 24 meq/L (ref 22–29)
Calcium: 9.2 mg/dL (ref 8.4–10.4)
Chloride: 99 mEq/L (ref 98–109)
Creatinine: 1.3 mg/dL — ABNORMAL HIGH (ref 0.6–1.1)
GLUCOSE: 115 mg/dL (ref 70–140)
Potassium: 3.4 mEq/L — ABNORMAL LOW (ref 3.5–5.1)
SODIUM: 136 meq/L (ref 136–145)
TOTAL PROTEIN: 7.4 g/dL (ref 6.4–8.3)

## 2014-05-14 LAB — CBC WITH DIFFERENTIAL/PLATELET
BASO%: 0.4 % (ref 0.0–2.0)
BASOS ABS: 0 10*3/uL (ref 0.0–0.1)
EOS%: 1.5 % (ref 0.0–7.0)
Eosinophils Absolute: 0 10*3/uL (ref 0.0–0.5)
HEMATOCRIT: 28.6 % — AB (ref 34.8–46.6)
HEMOGLOBIN: 9.2 g/dL — AB (ref 11.6–15.9)
LYMPH%: 33.6 % (ref 14.0–49.7)
MCH: 33.1 pg (ref 25.1–34.0)
MCHC: 32.2 g/dL (ref 31.5–36.0)
MCV: 102.9 fL — ABNORMAL HIGH (ref 79.5–101.0)
MONO#: 0.2 10*3/uL (ref 0.1–0.9)
MONO%: 6.2 % (ref 0.0–14.0)
NEUT%: 58.3 % (ref 38.4–76.8)
NEUTROS ABS: 1.6 10*3/uL (ref 1.5–6.5)
PLATELETS: 230 10*3/uL (ref 145–400)
RBC: 2.78 10*6/uL — AB (ref 3.70–5.45)
RDW: 17.5 % — ABNORMAL HIGH (ref 11.2–14.5)
WBC: 2.7 10*3/uL — ABNORMAL LOW (ref 3.9–10.3)
lymph#: 0.9 10*3/uL (ref 0.9–3.3)

## 2014-05-18 ENCOUNTER — Other Ambulatory Visit: Payer: Self-pay | Admitting: *Deleted

## 2014-05-18 DIAGNOSIS — C3412 Malignant neoplasm of upper lobe, left bronchus or lung: Secondary | ICD-10-CM

## 2014-05-21 ENCOUNTER — Other Ambulatory Visit: Payer: Medicare Other

## 2014-05-25 ENCOUNTER — Ambulatory Visit (HOSPITAL_COMMUNITY)
Admission: RE | Admit: 2014-05-25 | Discharge: 2014-05-25 | Disposition: A | Discharge status: Home / Self Care | Payer: Medicare Other | Source: Ambulatory Visit | Attending: Internal Medicine | Admitting: Internal Medicine

## 2014-05-25 DIAGNOSIS — I700 Atherosclerosis of aorta: Secondary | ICD-10-CM | POA: Diagnosis not present

## 2014-05-25 DIAGNOSIS — D3502 Benign neoplasm of left adrenal gland: Secondary | ICD-10-CM | POA: Insufficient documentation

## 2014-05-25 DIAGNOSIS — C3490 Malignant neoplasm of unspecified part of unspecified bronchus or lung: Secondary | ICD-10-CM | POA: Insufficient documentation

## 2014-05-25 DIAGNOSIS — D3501 Benign neoplasm of right adrenal gland: Secondary | ICD-10-CM | POA: Insufficient documentation

## 2014-05-25 DIAGNOSIS — K769 Liver disease, unspecified: Secondary | ICD-10-CM | POA: Insufficient documentation

## 2014-05-25 DIAGNOSIS — C3412 Malignant neoplasm of upper lobe, left bronchus or lung: Secondary | ICD-10-CM

## 2014-05-25 MED ORDER — IOHEXOL 300 MG/ML  SOLN
100.0000 mL | Freq: Once | INTRAMUSCULAR | Status: AC | PRN
  Administered 2014-05-25: 100 mL via INTRAVENOUS

## 2014-05-28 ENCOUNTER — Other Ambulatory Visit: Payer: Medicare Other

## 2014-05-28 ENCOUNTER — Telehealth: Payer: Self-pay | Admitting: Internal Medicine

## 2014-05-28 ENCOUNTER — Ambulatory Visit: Payer: Medicare Other | Admitting: Internal Medicine

## 2014-05-28 NOTE — Telephone Encounter (Signed)
pt called to r/s ......done...pt aware of new d.t

## 2014-06-04 ENCOUNTER — Telehealth: Payer: Self-pay | Admitting: Internal Medicine

## 2014-06-04 ENCOUNTER — Encounter: Payer: Self-pay | Admitting: Internal Medicine

## 2014-06-04 ENCOUNTER — Other Ambulatory Visit (HOSPITAL_BASED_OUTPATIENT_CLINIC_OR_DEPARTMENT_OTHER): Payer: Medicare Other

## 2014-06-04 ENCOUNTER — Ambulatory Visit (HOSPITAL_BASED_OUTPATIENT_CLINIC_OR_DEPARTMENT_OTHER): Payer: Medicare Other | Admitting: Internal Medicine

## 2014-06-04 VITALS — BP 121/63 | HR 78 | Temp 98.7°F | Resp 18 | Ht 62.0 in

## 2014-06-04 DIAGNOSIS — R600 Localized edema: Secondary | ICD-10-CM

## 2014-06-04 DIAGNOSIS — C3411 Malignant neoplasm of upper lobe, right bronchus or lung: Secondary | ICD-10-CM

## 2014-06-04 DIAGNOSIS — I714 Abdominal aortic aneurysm, without rupture, unspecified: Secondary | ICD-10-CM

## 2014-06-04 DIAGNOSIS — C3412 Malignant neoplasm of upper lobe, left bronchus or lung: Secondary | ICD-10-CM

## 2014-06-04 LAB — COMPREHENSIVE METABOLIC PANEL (CC13)
ALBUMIN: 3.2 g/dL — AB (ref 3.5–5.0)
ALK PHOS: 108 U/L (ref 40–150)
ALT: 17 U/L (ref 0–55)
AST: 22 U/L (ref 5–34)
Anion Gap: 12 mEq/L — ABNORMAL HIGH (ref 3–11)
BUN: 17.5 mg/dL (ref 7.0–26.0)
CO2: 20 mEq/L — ABNORMAL LOW (ref 22–29)
Calcium: 9.4 mg/dL (ref 8.4–10.4)
Chloride: 110 mEq/L — ABNORMAL HIGH (ref 98–109)
Creatinine: 1.1 mg/dL (ref 0.6–1.1)
EGFR: 53 mL/min/{1.73_m2} — ABNORMAL LOW (ref >90)
Glucose: 95 mg/dl (ref 70–140)
POTASSIUM: 4.3 meq/L (ref 3.5–5.1)
Sodium: 142 mEq/L (ref 136–145)
Total Bilirubin: 0.23 mg/dL (ref 0.20–1.20)
Total Protein: 7.3 g/dL (ref 6.4–8.3)

## 2014-06-04 LAB — CBC WITH DIFFERENTIAL/PLATELET
BASO%: 1.5 % (ref 0.0–2.0)
BASOS ABS: 0.1 10*3/uL (ref 0.0–0.1)
EOS%: 0.5 % (ref 0.0–7.0)
Eosinophils Absolute: 0 10*3/uL (ref 0.0–0.5)
HCT: 30 % — ABNORMAL LOW (ref 34.8–46.6)
HGB: 9.6 g/dL — ABNORMAL LOW (ref 11.6–15.9)
LYMPH%: 22 % (ref 14.0–49.7)
MCH: 34.7 pg — AB (ref 25.1–34.0)
MCHC: 32.1 g/dL (ref 31.5–36.0)
MCV: 108.1 fL — AB (ref 79.5–101.0)
MONO#: 1.1 10*3/uL — ABNORMAL HIGH (ref 0.1–0.9)
MONO%: 19.1 % — AB (ref 0.0–14.0)
NEUT#: 3.2 10*3/uL (ref 1.5–6.5)
NEUT%: 56.9 % (ref 38.4–76.8)
Platelets: 297 10*3/uL (ref 145–400)
RBC: 2.78 10*6/uL — ABNORMAL LOW (ref 3.70–5.45)
RDW: 20 % — AB (ref 11.2–14.5)
WBC: 5.6 10*3/uL (ref 3.9–10.3)
lymph#: 1.2 10*3/uL (ref 0.9–3.3)

## 2014-06-04 NOTE — Telephone Encounter (Signed)
Gave avs & cal for March.

## 2014-06-04 NOTE — Progress Notes (Signed)
[facility] [phone]   Fax:(336) [phone]  OFFICE PROGRESS NOTE  [name], MD [place] [address]  DIAGNOSIS: Lung cancer  Primary site: Lung (Right)  Staging method: AJCC 7th Edition  Clinical: Stage IIIA (T3, N2, M0) signed by Curt Bears, MD on 01/01/2014 6:57 PM  Summary: Stage IIIA (T3, N2, M0)   PRIOR THERAPY: Systemic chemotherapy with carboplatin for an AUC of 4 and Alimta 400 mg/m2 given in 3 weeks. Status post 6 cycles, last dose was given 05/07/2014 with partial response.   CURRENT THERAPY: None  DISEASE STAGE:  Lung cancer  Primary site: Lung (Right)  Staging method: AJCC 7th Edition  Clinical: Stage IIIA (T3, N2, M0) signed by Curt Bears, MD on 01/01/2014 6:57 PM  Summary: Stage IIIA (T3, N2, M0)  CHEMOTHERAPY INTENT: palliative  CURRENT # OF CHEMOTHERAPY CYCLES: 0 CURRENT ANTIEMETICS: compazine  CURRENT SMOKING STATUS: current smoker  ORAL CHEMOTHERAPY AND CONSENT: n/a  CURRENT BISPHOSPHONATES USE: none  PAIN MANAGEMENT: hydrocodone  NARCOTICS INDUCED CONSTIPATION: none  LIVING WILL AND CODE STATUS:   INTERVAL HISTORY: Beverly Lewis 80 y.o. female returns to the clinic today for followup visit accompanied by her grandson. The patient tolerated the last cycles of her systemic chemotherapy fairly well with no significant adverse effects except for mild fatigue. She denied having any nausea or vomiting. She has no fever or chills. She denied having any weight loss or night sweats. She has no chest pain but continues to have shortness of breath at baseline and increased with exertion with no cough or hemoptysis. She also has mild swelling of the lower extremity which resolved when she lie down at the evening time. The patient had repeat CT scan of the chest performed recently and she is here for evaluation and discussion of her scan results.  MEDICAL HISTORY: Past Medical History  Diagnosis  Date  . Hypertension   . Hyperlipidemia   . Cerebrovascular accident history of mild cerebrovascular accident which caused some visual disturbance.   . Diabetes mellitus   . Morbid obesity   . Lung mass     Right upper Lobe  . Shortness of breath   . Pneumonia     hx  . Peripheral vascular disease     hx blood clot 30 yrs ago leg  . GERD (gastroesophageal reflux disease)   . Arthritis   . Pneumothorax, iatrogenic 8/2/113-discharge note    Post Biospy  . Cough with hemoptysis 01/22/12    Prior to discharge  . Tachycardia 01/22/12    Prior to discharge  . History of radiation therapy 02/18/12,02/23/12,02/25/12., 03/01/12,&03/03/12    RULlung 50Gy/5/fx  . Lung cancer 01/21/12    ALLERGIES:  is allergic to asa buff (mag.  MEDICATIONS:  Current Outpatient Prescriptions  Medication Sig Dispense Refill  . allopurinol (ZYLOPRIM) 100 MG tablet Take 100 mg by mouth daily.     Marland Kitchen amLODipine (NORVASC) 10 MG tablet Take 10 mg by mouth daily.      Marland Kitchen aspirin EC 81 MG tablet Take 81 mg by mouth daily.    Marland Kitchen atorvastatin (LIPITOR) 20 MG tablet Take 20 mg by mouth daily.      Marland Kitchen COLCRYS 0.6 MG tablet Take 0.6 mg by mouth 2 (two) times daily as needed. For gout    . dexamethasone (DECADRON) 4 MG tablet Take 1 tab twice a day on the day before, day of, day after chemo. 30 tablet 1  .  eprosartan-hydrochlorothiazide (TEVETEN HCT) 600-12.5 MG per tablet Take 1 tablet by mouth daily.     . Ergocalciferol (VITAMIN D2) 2000 UNITS TABS Take 1 tablet by mouth daily.    . fluticasone (FLONASE) 50 MCG/ACT nasal spray     . folic acid (FOLVITE) 1 MG tablet Take 1 tablet (1 mg total) by mouth daily. 30 tablet 3  . furosemide (LASIX) 20 MG tablet Take 20 mg by mouth daily.     Marland Kitchen HYDROcodone-acetaminophen (LORCET) 10-650 MG per tablet Take 1 tablet by mouth every 8 (eight) hours as needed for pain.     Marland Kitchen JANUVIA 100 MG tablet Take 100 mg by mouth daily.     . megestrol (MEGACE) 20 MG tablet Take 20 mg by mouth daily.      Marland Kitchen NEXIUM 40 MG capsule Take 40 mg by mouth daily before breakfast.     . NIASPAN 500 MG CR tablet Take 500 mg by mouth at bedtime.     . prochlorperazine (COMPAZINE) 10 MG tablet Take 1 tablet (10 mg total) by mouth every 6 (six) hours as needed for nausea or vomiting. 30 tablet 1   No current facility-administered medications for this visit.    SURGICAL HISTORY:  Past Surgical History  Procedure Laterality Date  . Abdominal aortic aneurysm repair  06/28/10    Stent Graft repair    . Exploratory laparotomy      For ectopic pregnancy  . Lumbar disc surgery    . Aortogram   03/03/11    with stenting , left external iliac artery  . Appendectomy    . Navigational bronchoscopy      Right Upper Lobe Mass with Biopsies, Brushings, Washings, and bronchoalveolar Lavage:  . Paratracheal adenopathy and bilateral adrenal nodules      REVIEW OF SYSTEMS:  Constitutional: positive for fatigue Eyes: negative Ears, nose, mouth, throat, and face: negative Respiratory: positive for cough and dyspnea on exertion Cardiovascular: negative Gastrointestinal: negative Genitourinary:negative Integument/breast: negative Hematologic/lymphatic: negative Musculoskeletal:negative Neurological: negative Behavioral/Psych: negative Endocrine: negative Allergic/Immunologic: negative   PHYSICAL EXAMINATION: General appearance: alert, cooperative, fatigued and no distress Head: Normocephalic, without obvious abnormality, atraumatic Neck: no adenopathy, no JVD, supple, symmetrical, trachea midline and thyroid not enlarged, symmetric, no tenderness/mass/nodules Lymph nodes: Cervical, supraclavicular, and axillary nodes normal. Resp: clear to auscultation bilaterally Back: symmetric, no curvature. ROM normal. No CVA tenderness. Cardio: regular rate and rhythm, S1, S2 normal, no murmur, click, rub or gallop GI: soft, non-tender; bowel sounds normal; no masses,  no organomegaly Extremities: 1+ edema  bilaterally Neurologic: Alert and oriented X 3, normal strength and tone. Normal symmetric reflexes. Normal coordination and gait  ECOG PERFORMANCE STATUS: 2 - Symptomatic, <50% confined to bed  Blood pressure 121/63, pulse 78, temperature 98.7 F (37.1 C), temperature source Oral, resp. rate 18, height 5\' 2"  (1.575 m), weight 0 lb (0 kg), SpO2 97 %.  LABORATORY DATA: Lab Results  Component Value Date   WBC 5.6 06/04/2014   HGB 9.6* 06/04/2014   HCT 30.0* 06/04/2014   MCV 108.1* 06/04/2014   PLT 297 06/04/2014      Chemistry      Component Value Date/Time   NA 142 06/04/2014 0802   NA 140 01/20/2012 0840   K 4.3 06/04/2014 0802   K 3.8 01/20/2012 0840   CL 103 01/20/2012 0840   CO2 20* 06/04/2014 0802   CO2 24 01/20/2012 0840   BUN 17.5 06/04/2014 0802   BUN 24* 10/23/2013 1118   CREATININE  1.1 06/04/2014 0802   CREATININE 1.10 10/23/2013 1118   CREATININE 1.08 01/20/2012 0840      Component Value Date/Time   CALCIUM 9.4 06/04/2014 0802   CALCIUM 9.4 01/20/2012 0840   ALKPHOS 108 06/04/2014 0802   ALKPHOS 97 01/20/2012 0840   AST 22 06/04/2014 0802   AST 15 01/20/2012 0840   ALT 17 06/04/2014 0802   ALT 13 01/20/2012 0840   BILITOT 0.23 06/04/2014 0802   BILITOT 0.2* 01/20/2012 0840       RADIOGRAPHIC STUDIES: Ct Chest W Contrast  05/25/2014   CLINICAL DATA:  Restaging lung cancer.  EXAM: CT CHEST WITH CONTRAST  TECHNIQUE: Multidetector CT imaging of the chest was performed during intravenous contrast administration.  CONTRAST:  130mL OMNIPAQUE IOHEXOL 300 MG/ML  SOLN  COMPARISON:  03/26/2014  FINDINGS: Chest wall: No breast masses, supraclavicular or axillary lymphadenopathy. The bony thorax is intact. No destructive bone lesions or spinal canal compromise. Stable degenerative changes.  Mediastinum: The heart is normal in size. No pericardial effusion. No mediastinal or hilar mass or adenopathy. The pretracheal lymph node on image number 19 measures 7.5 mm. It  previously measured 10.5 mm. No hilar adenopathy. Stable advanced atherosclerotic disease involving the aorta with penetrating ulcers. No focal aneurysm or dissection. Stable three-vessel coronary artery calcifications.  Lungs: The right upper lobe lung mass is slightly smaller. It measures approximately 6.7 x 2.7 cm and previously measured 6.8 x 3.3 cm. No new pulmonary nodules or pleural effusion.  Upper abdomen: Stable bilateral adrenal gland adenomas. No hepatic metastatic disease. Stable low-attenuation liver lesions which are likely benign cysts.  IMPRESSION: 1. Slight interval regression of right upper lobe lung mass and pretracheal lymph node. No new or progressive disease. 2. Stable bilateral adrenal gland adenomas. 3. Stable advanced atherosclerotic disease involving the aorta and three-vessel coronary artery calcifications.   Electronically Signed   By: Kalman Jewels M.D.   On: 05/25/2014 10:15   ASSESSMENT AND PLAN: This is a very pleasant 80 years old [place] American female with unresectable stage IIIA non-small cell lung cancer and the patient is not a candidate for radiotherapy. She was treated with systemic chemotherapy with carboplatin and Alimta status post 6 cycles. She is tolerating her treatment fairly well except for the fatigue. The recent CT scan of the chest showed further improvement in her disease. I discussed the scan results with the patient and her grandson. I gave her the option of consideration of maintenance chemotherapy versus observation and close monitoring. The patient would like to continue on observation. I will see her back for follow-up visit in 3 months with repeat CT scan of the chest. For the lower extremity edema, I advised the patient to take Lasix daily on as-needed basis. She was advised to call immediately if she has any concerning symptoms in the interval. The patient voices understanding of current disease status and treatment options and is in  agreement with the current care plan.  All questions were answered. The patient knows to call the clinic with any problems, questions or concerns. We can certainly see the patient much sooner if necessary.  Disclaimer: This note was dictated with voice recognition software. Similar sounding words can inadvertently be transcribed and may not be corrected upon review.

## 2014-06-04 NOTE — Patient Instructions (Signed)
Smoking Cessation, Tips for Success  If you are ready to quit smoking, congratulations! You have chosen to help yourself be healthier. Cigarettes bring nicotine, tar, carbon monoxide, and other irritants into your body. Your lungs, heart, and blood vessels will be able to work better without these poisons. There are many different ways to quit smoking. Nicotine gum, nicotine patches, a nicotine inhaler, or nicotine nasal spray can help with physical craving. Hypnosis, support groups, and medicines help break the habit of smoking.  WHAT THINGS CAN I DO TO MAKE QUITTING EASIER?   Here are some tips to help you quit for good:  · Pick a date when you will quit smoking completely. Tell all of your friends and family about your plan to quit on that date.  · Do not try to slowly cut down on the number of cigarettes you are smoking. Pick a quit date and quit smoking completely starting on that day.  · Throw away all cigarettes.    · Clean and remove all ashtrays from your home, work, and car.  · On a card, write down your reasons for quitting. Carry the card with you and read it when you get the urge to smoke.  · Cleanse your body of nicotine. Drink enough water and fluids to keep your urine clear or pale yellow. Do this after quitting to flush the nicotine from your body.  · Learn to predict your moods. Do not let a bad situation be your excuse to have a cigarette. Some situations in your life might tempt you into wanting a cigarette.  · Never have "just one" cigarette. It leads to wanting another and another. Remind yourself of your decision to quit.  · Change habits associated with smoking. If you smoked while driving or when feeling stressed, try other activities to replace smoking. Stand up when drinking your coffee. Brush your teeth after eating. Sit in a different chair when you read the paper. Avoid alcohol while trying to quit, and try to drink fewer caffeinated beverages. Alcohol and caffeine may urge you to  smoke.  · Avoid foods and drinks that can trigger a desire to smoke, such as sugary or spicy foods and alcohol.  · Ask people who smoke not to smoke around you.  · Have something planned to do right after eating or having a cup of coffee. For example, plan to take a walk or exercise.  · Try a relaxation exercise to calm you down and decrease your stress. Remember, you may be tense and nervous for the first 2 weeks after you quit, but this will pass.  · Find new activities to keep your hands busy. Play with a pen, coin, or rubber band. Doodle or draw things on paper.  · Brush your teeth right after eating. This will help cut down on the craving for the taste of tobacco after meals. You can also try mouthwash.    · Use oral substitutes in place of cigarettes. Try using lemon drops, carrots, cinnamon sticks, or chewing gum. Keep them handy so they are available when you have the urge to smoke.  · When you have the urge to smoke, try deep breathing.  · Designate your home as a nonsmoking area.  · If you are a heavy smoker, ask your health care provider about a prescription for nicotine chewing gum. It can ease your withdrawal from nicotine.  · Reward yourself. Set aside the cigarette money you save and buy yourself something nice.  · Look for   support from others. Join a support group or smoking cessation program. Ask someone at home or at work to help you with your plan to quit smoking.  · Always ask yourself, "Do I need this cigarette or is this just a reflex?" Tell yourself, "Today, I choose not to smoke," or "I do not want to smoke." You are reminding yourself of your decision to quit.  · Do not replace cigarette smoking with electronic cigarettes (commonly called e-cigarettes). The safety of e-cigarettes is unknown, and some may contain harmful chemicals.  · If you relapse, do not give up! Plan ahead and think about what you will do the next time you get the urge to smoke.  HOW WILL I FEEL WHEN I QUIT SMOKING?  You  may have symptoms of withdrawal because your body is used to nicotine (the addictive substance in cigarettes). You may crave cigarettes, be irritable, feel very hungry, cough often, get headaches, or have difficulty concentrating. The withdrawal symptoms are only temporary. They are strongest when you first quit but will go away within 10-14 days. When withdrawal symptoms occur, stay in control. Think about your reasons for quitting. Remind yourself that these are signs that your body is healing and getting used to being without cigarettes. Remember that withdrawal symptoms are easier to treat than the major diseases that smoking can cause.   Even after the withdrawal is over, expect periodic urges to smoke. However, these cravings are generally short lived and will go away whether you smoke or not. Do not smoke!  WHAT RESOURCES ARE AVAILABLE TO HELP ME QUIT SMOKING?  Your health care provider can direct you to community resources or hospitals for support, which may include:  · Group support.  · Education.  · Hypnosis.  · Therapy.  Document Released: 03/06/2004 Document Revised: 10/23/2013 Document Reviewed: 11/24/2012  Exit[employer]® Patient Information ©2015 [employer]. This information is not intended to replace advice given to you by your health care provider. Make sure you discuss any questions you have with your health care provider.

## 2014-09-03 ENCOUNTER — Ambulatory Visit (HOSPITAL_COMMUNITY)
Admission: RE | Admit: 2014-09-03 | Discharge: 2014-09-03 | Disposition: A | Discharge status: Home / Self Care | Payer: Medicare Other | Source: Ambulatory Visit | Attending: Internal Medicine | Admitting: Internal Medicine

## 2014-09-03 ENCOUNTER — Other Ambulatory Visit (HOSPITAL_BASED_OUTPATIENT_CLINIC_OR_DEPARTMENT_OTHER): Payer: Medicare Other

## 2014-09-03 ENCOUNTER — Telehealth: Payer: Self-pay | Admitting: *Deleted

## 2014-09-03 DIAGNOSIS — Z79899 Other long term (current) drug therapy: Secondary | ICD-10-CM | POA: Diagnosis not present

## 2014-09-03 DIAGNOSIS — Z720 Tobacco use: Secondary | ICD-10-CM | POA: Diagnosis not present

## 2014-09-03 DIAGNOSIS — I714 Abdominal aortic aneurysm, without rupture, unspecified: Secondary | ICD-10-CM

## 2014-09-03 DIAGNOSIS — C3412 Malignant neoplasm of upper lobe, left bronchus or lung: Secondary | ICD-10-CM | POA: Diagnosis not present

## 2014-09-03 DIAGNOSIS — C3411 Malignant neoplasm of upper lobe, right bronchus or lung: Secondary | ICD-10-CM

## 2014-09-03 LAB — COMPREHENSIVE METABOLIC PANEL (CC13)
ALBUMIN: 3.3 g/dL — AB (ref 3.5–5.0)
ALK PHOS: 98 U/L (ref 40–150)
ALT: 19 U/L (ref 0–55)
ANION GAP: 10 meq/L (ref 3–11)
AST: 19 U/L (ref 5–34)
BILIRUBIN TOTAL: 0.32 mg/dL (ref 0.20–1.20)
BUN: 28.9 mg/dL — AB (ref 7.0–26.0)
CO2: 25 meq/L (ref 22–29)
Calcium: 9.6 mg/dL (ref 8.4–10.4)
Chloride: 109 mEq/L (ref 98–109)
Creatinine: 1.4 mg/dL — ABNORMAL HIGH (ref 0.6–1.1)
EGFR: 40 mL/min/{1.73_m2} — ABNORMAL LOW (ref >90)
GLUCOSE: 104 mg/dL (ref 70–140)
Potassium: 4.3 mEq/L (ref 3.5–5.1)
Sodium: 144 mEq/L (ref 136–145)
Total Protein: 7.2 g/dL (ref 6.4–8.3)

## 2014-09-03 LAB — CBC WITH DIFFERENTIAL/PLATELET
BASO%: 0.6 % (ref 0.0–2.0)
BASOS ABS: 0 10*3/uL (ref 0.0–0.1)
EOS%: 1.5 % (ref 0.0–7.0)
Eosinophils Absolute: 0.1 10*3/uL (ref 0.0–0.5)
HCT: 41.2 % (ref 34.8–46.6)
HEMOGLOBIN: 13 g/dL (ref 11.6–15.9)
LYMPH#: 1.5 10*3/uL (ref 0.9–3.3)
LYMPH%: 31.2 % (ref 14.0–49.7)
MCH: 31.8 pg (ref 25.1–34.0)
MCHC: 31.6 g/dL (ref 31.5–36.0)
MCV: 100.7 fL (ref 79.5–101.0)
MONO#: 0.5 10*3/uL (ref 0.1–0.9)
MONO%: 11.4 % (ref 0.0–14.0)
NEUT#: 2.6 10*3/uL (ref 1.5–6.5)
NEUT%: 55.3 % (ref 38.4–76.8)
Platelets: 185 10*3/uL (ref 145–400)
RBC: 4.09 10*6/uL (ref 3.70–5.45)
RDW: 15.4 % — ABNORMAL HIGH (ref 11.2–14.5)
WBC: 4.7 10*3/uL (ref 3.9–10.3)

## 2014-09-03 NOTE — Telephone Encounter (Signed)
THE RESULTS WERE CALLED AND FAXED. THIS REPORT WAS GIVEN TO DR.MOHAMED.

## 2014-09-05 ENCOUNTER — Telehealth: Payer: Self-pay | Admitting: *Deleted

## 2014-09-05 NOTE — Telephone Encounter (Signed)
Received message from Dr. Julien Nordmann.  Spoke with pt at home and informed pt that Dr. Julien Nordmann will discuss scan results with pt and family at her next office visit on 09/10/14.  Pt voiced understanding.

## 2014-09-05 NOTE — Telephone Encounter (Signed)
I will go over the results with the patient and her family next week during the visit time

## 2014-09-05 NOTE — Telephone Encounter (Signed)
Received call from son calling for pt re:  Pt had CT scan done  09/03/14.  Pt would like to know results of scan.   Pt has f/u appt with Dr. Julien Nordmann on  09/10/14.  Message routed to Dr. Julien Nordmann and desk nurse today Shauna Hugh, [place]. Pt's  Phone    [phone]

## 2014-09-10 ENCOUNTER — Ambulatory Visit (HOSPITAL_BASED_OUTPATIENT_CLINIC_OR_DEPARTMENT_OTHER): Payer: Medicare Other | Admitting: Internal Medicine

## 2014-09-10 ENCOUNTER — Emergency Department (HOSPITAL_COMMUNITY): Payer: Medicare Other

## 2014-09-10 ENCOUNTER — Encounter (HOSPITAL_COMMUNITY): Payer: Self-pay | Admitting: Emergency Medicine

## 2014-09-10 ENCOUNTER — Ambulatory Visit (HOSPITAL_BASED_OUTPATIENT_CLINIC_OR_DEPARTMENT_OTHER): Payer: Medicare Other

## 2014-09-10 ENCOUNTER — Telehealth: Payer: Self-pay | Admitting: Internal Medicine

## 2014-09-10 ENCOUNTER — Emergency Department (HOSPITAL_COMMUNITY)
Admission: EM | Admit: 2014-09-10 | Discharge: 2014-09-11 | Disposition: A | Discharge status: Home / Self Care | Payer: Medicare Other | Attending: Emergency Medicine | Admitting: Emergency Medicine

## 2014-09-10 ENCOUNTER — Encounter: Payer: Self-pay | Admitting: Internal Medicine

## 2014-09-10 VITALS — BP 116/66 | HR 97 | Temp 98.3°F | Resp 18 | Ht 62.0 in | Wt 222.1 lb

## 2014-09-10 DIAGNOSIS — Z720 Tobacco use: Secondary | ICD-10-CM | POA: Insufficient documentation

## 2014-09-10 DIAGNOSIS — S42202A Unspecified fracture of upper end of left humerus, initial encounter for closed fracture: Secondary | ICD-10-CM

## 2014-09-10 DIAGNOSIS — Y998 Other external cause status: Secondary | ICD-10-CM | POA: Insufficient documentation

## 2014-09-10 DIAGNOSIS — W01198A Fall on same level from slipping, tripping and stumbling with subsequent striking against other object, initial encounter: Secondary | ICD-10-CM | POA: Insufficient documentation

## 2014-09-10 DIAGNOSIS — Z8673 Personal history of transient ischemic attack (TIA), and cerebral infarction without residual deficits: Secondary | ICD-10-CM | POA: Insufficient documentation

## 2014-09-10 DIAGNOSIS — Y9389 Activity, other specified: Secondary | ICD-10-CM | POA: Insufficient documentation

## 2014-09-10 DIAGNOSIS — Z7982 Long term (current) use of aspirin: Secondary | ICD-10-CM | POA: Insufficient documentation

## 2014-09-10 DIAGNOSIS — Z791 Long term (current) use of non-steroidal anti-inflammatories (NSAID): Secondary | ICD-10-CM | POA: Diagnosis not present

## 2014-09-10 DIAGNOSIS — Z8709 Personal history of other diseases of the respiratory system: Secondary | ICD-10-CM | POA: Insufficient documentation

## 2014-09-10 DIAGNOSIS — E119 Type 2 diabetes mellitus without complications: Secondary | ICD-10-CM | POA: Diagnosis not present

## 2014-09-10 DIAGNOSIS — E785 Hyperlipidemia, unspecified: Secondary | ICD-10-CM | POA: Insufficient documentation

## 2014-09-10 DIAGNOSIS — K219 Gastro-esophageal reflux disease without esophagitis: Secondary | ICD-10-CM | POA: Insufficient documentation

## 2014-09-10 DIAGNOSIS — S42302A Unspecified fracture of shaft of humerus, left arm, initial encounter for closed fracture: Secondary | ICD-10-CM | POA: Insufficient documentation

## 2014-09-10 DIAGNOSIS — Z8701 Personal history of pneumonia (recurrent): Secondary | ICD-10-CM | POA: Insufficient documentation

## 2014-09-10 DIAGNOSIS — I10 Essential (primary) hypertension: Secondary | ICD-10-CM | POA: Diagnosis not present

## 2014-09-10 DIAGNOSIS — S0093XA Contusion of unspecified part of head, initial encounter: Secondary | ICD-10-CM

## 2014-09-10 DIAGNOSIS — C3411 Malignant neoplasm of upper lobe, right bronchus or lung: Secondary | ICD-10-CM | POA: Insufficient documentation

## 2014-09-10 DIAGNOSIS — M1990 Unspecified osteoarthritis, unspecified site: Secondary | ICD-10-CM | POA: Insufficient documentation

## 2014-09-10 DIAGNOSIS — W19XXXA Unspecified fall, initial encounter: Secondary | ICD-10-CM

## 2014-09-10 DIAGNOSIS — R530 Neoplastic (malignant) related fatigue: Secondary | ICD-10-CM

## 2014-09-10 DIAGNOSIS — Z7951 Long term (current) use of inhaled steroids: Secondary | ICD-10-CM | POA: Diagnosis not present

## 2014-09-10 DIAGNOSIS — Y9289 Other specified places as the place of occurrence of the external cause: Secondary | ICD-10-CM | POA: Diagnosis not present

## 2014-09-10 DIAGNOSIS — S0083XA Contusion of other part of head, initial encounter: Secondary | ICD-10-CM | POA: Insufficient documentation

## 2014-09-10 DIAGNOSIS — Z79899 Other long term (current) drug therapy: Secondary | ICD-10-CM | POA: Insufficient documentation

## 2014-09-10 DIAGNOSIS — Z7952 Long term (current) use of systemic steroids: Secondary | ICD-10-CM | POA: Insufficient documentation

## 2014-09-10 DIAGNOSIS — Z86718 Personal history of other venous thrombosis and embolism: Secondary | ICD-10-CM | POA: Insufficient documentation

## 2014-09-10 DIAGNOSIS — C3412 Malignant neoplasm of upper lobe, left bronchus or lung: Secondary | ICD-10-CM

## 2014-09-10 DIAGNOSIS — S4992XA Unspecified injury of left shoulder and upper arm, initial encounter: Secondary | ICD-10-CM | POA: Diagnosis present

## 2014-09-10 MED ORDER — HYDROMORPHONE HCL 1 MG/ML IJ SOLN
1.0000 mg | Freq: Once | INTRAMUSCULAR | Status: AC
  Administered 2014-09-10: 1 mg via INTRAMUSCULAR
  Filled 2014-09-10: qty 1

## 2014-09-10 MED ORDER — CYANOCOBALAMIN 1000 MCG/ML IJ SOLN
1000.0000 ug | Freq: Once | INTRAMUSCULAR | Status: AC
  Administered 2014-09-10: 1000 ug via INTRAMUSCULAR

## 2014-09-10 MED ORDER — HYDROCODONE-ACETAMINOPHEN 5-325 MG PO TABS: 1.0000 | ORAL_TABLET | Freq: Four times a day (QID) | ORAL | Status: DC | PRN

## 2014-09-10 NOTE — Telephone Encounter (Signed)
Gave avs & calendar for March/April. Sent message to schedule treatment

## 2014-09-10 NOTE — ED Notes (Signed)
Pt's yellow ring with white stones is given to pt's grandson  [name] at bedside.

## 2014-09-10 NOTE — ED Notes (Signed)
Pt ambulated well and tolerated it well with assist.

## 2014-09-10 NOTE — Progress Notes (Signed)
[facility] Telephone:([phone]) [phone]   Fax:([phone]) [phone]  OFFICE PROGRESS NOTE  [name], MD [place] [address]  DIAGNOSIS: Lung cancer  Primary site: Lung (Right)  Staging method: AJCC 7th Edition  Clinical: Stage IIIA (T3, N2, M0) signed by Curt Bears, MD on 01/01/2014 6:57 PM  Summary: Stage IIIA (T3, N2, M0)   PRIOR THERAPY: Systemic chemotherapy with carboplatin for an AUC of 4 and Alimta 400 mg/m2 given in 3 weeks. Status post 6 cycles, last dose was given 05/07/2014 with partial response.   CURRENT THERAPY: Systemic chemotherapy with single agent Alimta 400 MG/M2 every C weeks. First dose 09/17/2014.  DISEASE STAGE:  Lung cancer  Primary site: Lung (Right)  Staging method: AJCC 7th Edition  Clinical: Stage IIIA (T3, N2, M0) signed by Curt Bears, MD on 01/01/2014 6:57 PM  Summary: Stage IIIA (T3, N2, M0)  CHEMOTHERAPY INTENT: palliative  CURRENT # OF CHEMOTHERAPY CYCLES: 1 CURRENT ANTIEMETICS: compazine  CURRENT SMOKING STATUS: current smoker  ORAL CHEMOTHERAPY AND CONSENT: n/a  CURRENT BISPHOSPHONATES USE: none  PAIN MANAGEMENT: hydrocodone  NARCOTICS INDUCED CONSTIPATION: none  LIVING WILL AND CODE STATUS:   INTERVAL HISTORY: Beverly Lewis 81 y.o. female returns to the clinic today for followup visit accompanied by her grandson. The patient has been on observation for the last 3 months. She is feeling fine today with no specific complaints except for some generalized aching pain secondary to arthritis. She denied having any nausea or vomiting. She has no fever or chills. She denied having any weight loss or night sweats. She has no chest pain but continues to have shortness of breath at baseline and increased with exertion with no cough or hemoptysis. The patient had repeat CT scan of the chest performed recently and she is here for evaluation and discussion of her scan results.  MEDICAL HISTORY: Past  Medical History  Diagnosis Date  . Hypertension   . Hyperlipidemia   . Cerebrovascular accident history of mild cerebrovascular accident which caused some visual disturbance.   . Diabetes mellitus   . Morbid obesity   . Lung mass     Right upper Lobe  . Shortness of breath   . Pneumonia     hx  . Peripheral vascular disease     hx blood clot 30 yrs ago leg  . GERD (gastroesophageal reflux disease)   . Arthritis   . Pneumothorax, iatrogenic 8/2/113-discharge note    Post Biospy  . Cough with hemoptysis 01/22/12    Prior to discharge  . Tachycardia 01/22/12    Prior to discharge  . History of radiation therapy 02/18/12,02/23/12,02/25/12., 03/01/12,&03/03/12    RULlung 50Gy/5/fx  . Lung cancer 01/21/12    ALLERGIES:  is allergic to asa buff (mag.  MEDICATIONS:  Current Outpatient Prescriptions  Medication Sig Dispense Refill  . allopurinol (ZYLOPRIM) 100 MG tablet Take 100 mg by mouth daily.     Marland Kitchen amLODipine (NORVASC) 10 MG tablet Take 10 mg by mouth daily.      Marland Kitchen aspirin EC 81 MG tablet Take 81 mg by mouth daily.    Marland Kitchen atorvastatin (LIPITOR) 20 MG tablet Take 20 mg by mouth daily.      Marland Kitchen COLCRYS 0.6 MG tablet Take 0.6 mg by mouth 2 (two) times daily as needed. For gout    . dexamethasone (DECADRON) 4 MG tablet Take 1 tab twice a day on the day before, day of, day after chemo. 30 tablet  1  . eprosartan-hydrochlorothiazide (TEVETEN HCT) 600-12.5 MG per tablet Take 1 tablet by mouth daily.     . Ergocalciferol (VITAMIN D2) 2000 UNITS TABS Take 1 tablet by mouth daily.    . fluticasone (FLONASE) 50 MCG/ACT nasal spray     . folic acid (FOLVITE) 1 MG tablet Take 1 tablet (1 mg total) by mouth daily. 30 tablet 3  . furosemide (LASIX) 20 MG tablet Take 20 mg by mouth daily.     Marland Kitchen HYDROcodone-acetaminophen (LORCET) 10-650 MG per tablet Take 1 tablet by mouth every 8 (eight) hours as needed for pain.     Marland Kitchen JANUVIA 100 MG tablet Take 100 mg by mouth daily.     . megestrol (MEGACE) 20 MG tablet  Take 20 mg by mouth daily.    Marland Kitchen NEXIUM 40 MG capsule Take 40 mg by mouth daily before breakfast.     . NIASPAN 500 MG CR tablet Take 500 mg by mouth at bedtime.     . prochlorperazine (COMPAZINE) 10 MG tablet Take 1 tablet (10 mg total) by mouth every 6 (six) hours as needed for nausea or vomiting. 30 tablet 1   No current facility-administered medications for this visit.    SURGICAL HISTORY:  Past Surgical History  Procedure Laterality Date  . Abdominal aortic aneurysm repair  06/28/10    Stent Graft repair    . Exploratory laparotomy      For ectopic pregnancy  . Lumbar disc surgery    . Aortogram   03/03/11    with stenting , left external iliac artery  . Appendectomy    . Navigational bronchoscopy      Right Upper Lobe Mass with Biopsies, Brushings, Washings, and bronchoalveolar Lavage:  . Paratracheal adenopathy and bilateral adrenal nodules      REVIEW OF SYSTEMS:  Constitutional: positive for fatigue Eyes: negative Ears, nose, mouth, throat, and face: negative Respiratory: positive for cough and dyspnea on exertion Cardiovascular: negative Gastrointestinal: negative Genitourinary:negative Integument/breast: negative Hematologic/lymphatic: negative Musculoskeletal:negative Neurological: negative Behavioral/Psych: negative Endocrine: negative Allergic/Immunologic: negative   PHYSICAL EXAMINATION: General appearance: alert, cooperative, fatigued and no distress Head: Normocephalic, without obvious abnormality, atraumatic Neck: no adenopathy, no JVD, supple, symmetrical, trachea midline and thyroid not enlarged, symmetric, no tenderness/mass/nodules Lymph nodes: Cervical, supraclavicular, and axillary nodes normal. Resp: clear to auscultation bilaterally Back: symmetric, no curvature. ROM normal. No CVA tenderness. Cardio: regular rate and rhythm, S1, S2 normal, no murmur, click, rub or gallop GI: soft, non-tender; bowel sounds normal; no masses,  no  organomegaly Extremities: 1+ edema bilaterally Neurologic: Alert and oriented X 3, normal strength and tone. Normal symmetric reflexes. Normal coordination and gait  ECOG PERFORMANCE STATUS: 2 - Symptomatic, <50% confined to bed  Blood pressure 116/66, pulse 97, temperature 98.3 F (36.8 C), temperature source Oral, resp. rate 18, height 5\' 2"  (1.575 m), weight 222 lb 1.6 oz (100.744 kg), SpO2 96 %.  LABORATORY DATA: Lab Results  Component Value Date   WBC 4.7 09/03/2014   HGB 13.0 09/03/2014   HCT 41.2 09/03/2014   MCV 100.7 09/03/2014   PLT 185 09/03/2014      Chemistry      Component Value Date/Time   NA 144 09/03/2014 0833   NA 140 01/20/2012 0840   K 4.3 09/03/2014 0833   K 3.8 01/20/2012 0840   CL 103 01/20/2012 0840   CO2 25 09/03/2014 0833   CO2 24 01/20/2012 0840   BUN 28.9* 09/03/2014 0833   BUN 24* 10/23/2013  1118   CREATININE 1.4* 09/03/2014 0833   CREATININE 1.10 10/23/2013 1118   CREATININE 1.08 01/20/2012 0840      Component Value Date/Time   CALCIUM 9.6 09/03/2014 0833   CALCIUM 9.4 01/20/2012 0840   ALKPHOS 98 09/03/2014 0833   ALKPHOS 97 01/20/2012 0840   AST 19 09/03/2014 0833   AST 15 01/20/2012 0840   ALT 19 09/03/2014 0833   ALT 13 01/20/2012 0840   BILITOT 0.32 09/03/2014 0833   BILITOT 0.2* 01/20/2012 0840       RADIOGRAPHIC STUDIES: Ct Chest Wo Contrast  09/03/2014   CLINICAL DATA:  Restaging left lung cancer diagnosed 2013. Chemotherapy ongoing. Subsequent treatment strategy.  EXAM: CT CHEST WITHOUT CONTRAST  TECHNIQUE: Multidetector CT imaging of the chest was performed following the standard protocol without IV contrast.  COMPARISON:  CT 05/25/2014, PET-CT 12/08/2013  FINDINGS: Mediastinum/Nodes: No axillary or supraclavicular lymphadenopathy. Right lower paratracheal lymph node measures 12 mm increased from 8 mm on comparison.  Small high right paratracheal lymph node measures 7 mm compared to 5 mm (image 8, series 2). No pericardial  fluid. Esophagus is normal.  The hilum is difficult to assess on this noncontrast exam.  Lungs/Pleura: Multi lobular right upper lobe consolidative mass measures 71 x 31 mm compared to 67 x 27 mm for an apparent increase in volume. This lesion measures 33 mm in maximal craniocaudal dimension compared to 29 mm. There is volume loss in the right upper lobe . Peripheral to the Upper pole to the lobular mass there is interlobular septal thickening increased from prior.  Upper abdomen: Bilateral adrenal adenomas are again demonstrated. Low-density lesion the left hepatic lobe consistent with benign cysts not changed.  Musculoskeletal: No aggressive osseous lesion. Multiple levels of endplate spurring and joint space narrowing in the thoracic spine.  IMPRESSION: 1. Interval increased in volume of right upper lobe lobular mass is concerning for recurrence / progression of lung carcinoma. 2. Increase in peripheral interlobular septal thickening suggests postobstructive pneumonitis. Alternatively this could relate to radiation change. 3. Mild increase in size of small paratracheal lymph nodes on the right is also concerning for recurrence. These results will be called to the ordering clinician or representative by the Radiologist Assistant, and communication documented in the PACS or zVision Dashboard.   Electronically Signed   By: Suzy Bouchard M.D.   On: 09/03/2014 09:47   ASSESSMENT AND PLAN: This is a very pleasant 81 years old [place] American female with unresectable stage IIIA non-small cell lung cancer and the patient is not a candidate for radiotherapy. She was treated with systemic chemotherapy with carboplatin and Alimta status post 6 cycles. She is tolerating her treatment fairly well except for the fatigue. She has been observation for the last 3 months. The recent CT scan of the chest showed evidence for disease progression with increase in the volume of the right upper lobe lobular mass. I discussed  the scan results with the patient and her grandson. I recommended for her to resume systemic chemotherapy and I recommended for her a regimen consisting of single agent Alimta 400 mg/M2 every 3 weeks. She is expected to start the first cycle of her treatment next week. The patient will receive vitamin B 12 injection today. She will come back for follow-up visit in 4 weeks with the start of cycle #2. She was advised to call immediately if she has any concerning symptoms in the interval. The patient voices understanding of current disease status and treatment options  and is in agreement with the current care plan.  All questions were answered. The patient knows to call the clinic with any problems, questions or concerns. We can certainly see the patient much sooner if necessary.  Disclaimer: This note was dictated with voice recognition software. Similar sounding words can inadvertently be transcribed and may not be corrected upon review.

## 2014-09-10 NOTE — ED Notes (Addendum)
Pt tripped and hitting head on column and hitting left shoulder. Pt has bump to right side of forehead. No LOC. Pt is not on blood thinners.

## 2014-09-10 NOTE — ED Provider Notes (Signed)
CSN: [id number]     Arrival date & time 09/10/14  1837 History   First MD Initiated Contact with Patient 09/10/14 1953     Chief Complaint  Patient presents with  . Fall  . Head Injury     Patient is a 81 y.o. female presenting with fall and head injury. The history is provided by the patient. No language interpreter was used.  Fall  Head Injury  Beverly Lewis presents for evaluation of injuries following a fall. She tripped over her rug when going to answer the door and fell forward striking her head on call him and landing on her left upper arm. She had no loss of consciousness. This happened around 2 PM today. She reports pain in her head as well as pain in her left upper arm. She denies any weakness. She is currently Beverly Lewis being followed by oncology for lung cancer. She takes a baby aspirin daily. Symptoms are moderate and constant.  Past Medical History  Diagnosis Date  . Hypertension   . Hyperlipidemia   . Cerebrovascular accident history of mild cerebrovascular accident which caused some visual disturbance.   . Diabetes mellitus   . Morbid obesity   . Lung mass     Right upper Lobe  . Shortness of breath   . Pneumonia     hx  . Peripheral vascular disease     hx blood clot 30 yrs ago leg  . GERD (gastroesophageal reflux disease)   . Arthritis   . Pneumothorax, iatrogenic 8/2/113-discharge note    Post Biospy  . Cough with hemoptysis 01/22/12    Prior to discharge  . Tachycardia 01/22/12    Prior to discharge  . History of radiation therapy 02/18/12,02/23/12,02/25/12., 03/01/12,&03/03/12    RULlung 50Gy/5/fx  . Lung cancer 01/21/12   Past Surgical History  Procedure Laterality Date  . Abdominal aortic aneurysm repair  06/28/10    Stent Graft repair    . Exploratory laparotomy      For ectopic pregnancy  . Lumbar disc surgery    . Aortogram   03/03/11    with stenting , left external iliac artery  . Appendectomy    . Navigational bronchoscopy      Right Upper Lobe Mass  with Biopsies, Brushings, Washings, and bronchoalveolar Lavage:  . Paratracheal adenopathy and bilateral adrenal nodules     Family History  Problem Relation Age of Onset  . Heart disease Mother   . Hypertension Mother   . Diabetes Mother   . Hyperlipidemia Mother   . Heart disease Father   . Hypertension Father   . Diabetes Father   . Hyperlipidemia Father   . Diabetes Daughter    History  Substance Use Topics  . Smoking status: Current Every Day Smoker -- 0.50 packs/day for 65 years    Types: Cigarettes  . Smokeless tobacco: Never Used  . Alcohol Use: No     Comment: She does not consume alcohol on a regular basis.   OB History    No data available     Review of Systems  All other systems reviewed and are negative.     Allergies  Asa buff (mag  Home Medications   Prior to Admission medications   Medication Sig Start Date End Date Taking? Authorizing Provider  acetaminophen (TYLENOL) 500 MG tablet Take 500 mg by mouth every 6 (six) hours as needed for moderate pain (inflammation).   Yes Historical Provider, MD  allopurinol (ZYLOPRIM) 100 MG tablet  Take 100 mg by mouth daily.    Yes Historical Provider, MD  amLODipine (NORVASC) 10 MG tablet Take 10 mg by mouth daily.     Yes Historical Provider, MD  aspirin EC 81 MG tablet Take 81 mg by mouth daily.   Yes Historical Provider, MD  atorvastatin (LIPITOR) 20 MG tablet Take 20 mg by mouth daily.     Yes Historical Provider, MD  eprosartan-hydrochlorothiazide (TEVETEN HCT) 600-12.5 MG per tablet Take 1 tablet by mouth daily.    Yes Historical Provider, MD  Ergocalciferol (VITAMIN D2) 2000 UNITS TABS Take 1 tablet by mouth daily.   Yes Historical Provider, MD  fluticasone (FLONASE) 50 MCG/ACT nasal spray Place 1 spray into both nostrils daily.  04/02/14  Yes Historical Provider, MD  folic acid (FOLVITE) 1 MG tablet Take 1 mg by mouth daily.   Yes Historical Provider, MD  furosemide (LASIX) 20 MG tablet Take 20 mg by mouth  daily as needed for edema (edema).  01/05/14  Yes Historical Provider, MD  HYDROcodone-acetaminophen (NORCO/VICODIN) 5-325 MG per tablet Take 1 tablet by mouth 2 (two) times daily.  07/06/14  Yes Historical Provider, MD  JANUVIA 100 MG tablet Take 100 mg by mouth daily.  03/11/11  Yes Historical Provider, MD  NEXIUM 40 MG capsule Take 40 mg by mouth daily before breakfast.  01/09/11  Yes Historical Provider, MD  sulindac (CLINORIL) 200 MG tablet Take 200 mg by mouth 2 (two) times daily. 08/24/14  Yes Historical Provider, MD  COLCRYS 0.6 MG tablet Take 0.6 mg by mouth 2 (two) times daily as needed. For gout 11/03/11   Historical Provider, MD  dexamethasone (DECADRON) 4 MG tablet Take 4 mg by mouth 2 (two) times daily with a meal. Take one tablet bid the day before , the day of and the day after chemo    Historical Provider, MD   BP 115/62 mmHg  Pulse 110  Temp(Src) 98 F (36.7 C) (Oral)  Resp 18  SpO2 95% Physical Exam  Constitutional: She is oriented to person, place, and time. She appears well-developed and well-nourished.  HENT:  Head: Normocephalic.  Moderate swelling to the right forehead with overlying abrasion  Cardiovascular: Normal rate and regular rhythm.   No murmur heard. Pulmonary/Chest: Effort normal and breath sounds normal. No respiratory distress.  Abdominal: Soft. There is no tenderness. There is no rebound and no guarding.  Musculoskeletal: She exhibits no edema.  To palpation over the left upper arm with decreased range of motion distally secondary to pain. 2+ radial pulses.  Neurological: She is alert and oriented to person, place, and time. No cranial nerve deficit.  Skin: Skin is warm and dry.  Psychiatric: She has a normal mood and affect. Her behavior is normal.  Nursing note and vitals reviewed.   ED Course  Procedures (including critical care time) Labs Review Labs Reviewed - No data to display  Imaging Review Ct Head Wo Contrast  09/10/2014   CLINICAL DATA:   [place] and hit head.  EXAM: CT HEAD WITHOUT CONTRAST  CT CERVICAL SPINE WITHOUT CONTRAST  TECHNIQUE: Multidetector CT imaging of the head and cervical spine was performed following the standard protocol without intravenous contrast. Multiplanar CT image reconstructions of the cervical spine were also generated.  COMPARISON:  None.  FINDINGS: CT HEAD FINDINGS  There is a large right final scalp hematoma. No underlying skull fracture. Hyperostosis frontalis interna is noted. There is age related cerebral atrophy, ventriculomegaly and fairly extensive periventricular white matter  disease. No extra-axial fluid collection or subdural hematoma. No findings for hemispheric infarction. No mass lesion. The brainstem and cerebellum are grossly normal. The paranasal sinuses and mastoid air cells are grossly clear.  CT CERVICAL SPINE FINDINGS  Severe degenerative cervical spondylosis with multilevel disc disease and facet disease. No definite acute fracture. The skullbase C1 and C1-2 articulations are maintained. The dens is intact. Bilateral carotid artery calcifications are noted. The visualized lung apices are clear.  IMPRESSION: No acute intracranial findings or skull fracture. Large right frontal scalp hematoma.  Severe degenerative cervical spondylosis but no definite acute fracture.   Electronically Signed   By: Marijo Sanes M.D.   On: 09/10/2014 21:26   Ct Cervical Spine Wo Contrast  09/10/2014   CLINICAL DATA:  [place] and hit head.  EXAM: CT HEAD WITHOUT CONTRAST  CT CERVICAL SPINE WITHOUT CONTRAST  TECHNIQUE: Multidetector CT imaging of the head and cervical spine was performed following the standard protocol without intravenous contrast. Multiplanar CT image reconstructions of the cervical spine were also generated.  COMPARISON:  None.  FINDINGS: CT HEAD FINDINGS  There is a large right final scalp hematoma. No underlying skull fracture. Hyperostosis frontalis interna is noted. There is age related cerebral atrophy,  ventriculomegaly and fairly extensive periventricular white matter disease. No extra-axial fluid collection or subdural hematoma. No findings for hemispheric infarction. No mass lesion. The brainstem and cerebellum are grossly normal. The paranasal sinuses and mastoid air cells are grossly clear.  CT CERVICAL SPINE FINDINGS  Severe degenerative cervical spondylosis with multilevel disc disease and facet disease. No definite acute fracture. The skullbase C1 and C1-2 articulations are maintained. The dens is intact. Bilateral carotid artery calcifications are noted. The visualized lung apices are clear.  IMPRESSION: No acute intracranial findings or skull fracture. Large right frontal scalp hematoma.  Severe degenerative cervical spondylosis but no definite acute fracture.   Electronically Signed   By: Marijo Sanes M.D.   On: 09/10/2014 21:26   Dg Humerus Left  09/10/2014   CLINICAL DATA:  [place] at home today.  Injured left arm.  EXAM: LEFT HUMERUS - 2+ VIEW  COMPARISON:  None.  FINDINGS: There is a complex comminuted fracture involving the proximal humeral shaft. The humeral head and neck are intact. There are a severe degenerative changes involving the glenohumeral joint. The elbow is grossly intact.  IMPRESSION: Complex comminuted proximal humeral shaft fracture.  Advanced glenohumeral joint degenerative changes.   Electronically Signed   By: Marijo Sanes M.D.   On: 09/10/2014 20:15     EKG Interpretation None      MDM   Final diagnoses:  Fall, initial encounter  Proximal humerus fracture, left, closed, initial encounter  Head contusion, initial encounter   Patient here for evaluation of injuries following a mechanical fall.  CT head and neck negative for acute injuries.  LUE with proximal comminuted humerus fracture.  Patient is NVI on exam.  She is able to ambulate without difficulty in the department.  Discussed orthopedics follow up as well as return precautions.      Beverly Reichert,  MD 09/11/14 [phone]

## 2014-09-10 NOTE — Discharge Instructions (Signed)
Facial or Scalp Contusion A facial or scalp contusion is a deep bruise on the face or head. Injuries to the face and head generally cause a lot of swelling, especially around the eyes. Contusions are the result of an injury that caused bleeding under the skin. The contusion may turn blue, purple, or yellow. Minor injuries will give you a painless contusion, but more severe contusions may stay painful and swollen for a few weeks.  CAUSES  A facial or scalp contusion is caused by a blunt injury or trauma to the face or head area.  SIGNS AND SYMPTOMS   Swelling of the injured area.   Discoloration of the injured area.   Tenderness, soreness, or pain in the injured area.  DIAGNOSIS  The diagnosis can be made by taking a medical history and doing a physical exam. An X-ray exam, CT scan, or MRI may be needed to determine if there are any associated injuries, such as broken bones (fractures). TREATMENT  Often, the best treatment for a facial or scalp contusion is applying cold compresses to the injured area. Over-the-counter medicines may also be recommended for pain control.  HOME CARE INSTRUCTIONS   Only take over-the-counter or prescription medicines as directed by your health care provider.   Apply ice to the injured area.   Put ice in a plastic bag.   Place a towel between your skin and the bag.   Leave the ice on for 20 minutes, 2-3 times a day.  SEEK MEDICAL CARE IF:  You have bite problems.   You have pain with chewing.   You are concerned about facial defects. SEEK IMMEDIATE MEDICAL CARE IF:  You have severe pain or a headache that is not relieved by medicine.   You have unusual sleepiness, confusion, or personality changes.   You throw up (vomit).   You have a persistent nosebleed.   You have double vision or blurred vision.   You have fluid drainage from your nose or ear.   You have difficulty walking or using your arms or legs.  MAKE SURE YOU:    Understand these instructions.  Will watch your condition.  Will get help right away if you are not doing well or get worse. Document Released: 07/16/2004 Document Revised: 03/29/2013 Document Reviewed: 01/19/2013 [facility] Patient Information 2015 [place], [place]. This information is not intended to replace advice given to you by your health care provider. Make sure you discuss any questions you have with your health care provider.  Humerus Fracture, Treated with Immobilization The humerus is the large bone in your upper arm. You have a broken (fractured) humerus. These fractures are easily diagnosed with X-rays. TREATMENT  Simple fractures which will heal without disability are treated with simple immobilization. Immobilization means you will wear a cast, splint, or sling. You have a fracture which will do well with immobilization. The fracture will heal well simply by being held in a good position until it is stable enough to begin range of motion exercises. Do not take part in activities which would further injure your arm.  HOME CARE INSTRUCTIONS   Put ice on the injured area.  Put ice in a plastic bag.  Place a towel between your skin and the bag.  Leave the ice on for 15-20 minutes, 03-04 times a day.  If you have a cast:  Do not scratch the skin under the cast using sharp or pointed objects.  Check the skin around the cast every day. You may put  lotion on any red or sore areas.  Keep your cast dry and clean.  If you have a splint:  Wear the splint as directed.  Keep your splint dry and clean.  You may loosen the elastic around the splint if your fingers become numb, tingle, or turn cold or blue.  If you have a sling:  Wear the sling as directed.  Do not put pressure on any part of your cast or splint until it is fully hardened.  Your cast or splint can be protected during bathing with a plastic bag. Do not lower the cast or splint into water.  Only take  over-the-counter or prescription medicines for pain, discomfort, or fever as directed by your caregiver.  Do range of motion exercises as instructed by your caregiver.  Follow up as directed by your caregiver. This is very important in order to avoid permanent injury or disability and chronic pain. SEEK IMMEDIATE MEDICAL CARE IF:   Your skin or nails in the injured arm turn blue or gray.  Your arm feels cold or numb.  You develop severe pain in the injured arm.  You are having problems with the medicines you were given. MAKE SURE YOU:   Understand these instructions.  Will watch your condition.  Will get help right away if you are not doing well or get worse. Document Released: 09/14/2000 Document Revised: 08/31/2011 Document Reviewed: 07/23/2010 [facility] Patient Information 2015 [place], [place]. This information is not intended to replace advice given to you by your health care provider. Make sure you discuss any questions you have with your health care provider.

## 2014-09-10 NOTE — ED Notes (Signed)
Bed: MZ58 Expected date:  Expected time:  Means of arrival:  Comments: Triage 1

## 2014-09-14 ENCOUNTER — Other Ambulatory Visit: Payer: Self-pay | Admitting: *Deleted

## 2014-09-14 DIAGNOSIS — C3412 Malignant neoplasm of upper lobe, left bronchus or lung: Secondary | ICD-10-CM

## 2014-09-17 ENCOUNTER — Ambulatory Visit: Payer: Medicare Other

## 2014-09-17 ENCOUNTER — Other Ambulatory Visit: Payer: Medicare Other

## 2014-09-17 ENCOUNTER — Telehealth: Payer: Self-pay | Admitting: Internal Medicine

## 2014-09-17 NOTE — Telephone Encounter (Signed)
Pt called states she broke her arm and can't make it in today and will just come to her next visit that is already set up. Sent msg to MD advising of the reason for cancelling.... KJ

## 2014-09-25 ENCOUNTER — Telehealth: Payer: Self-pay | Admitting: *Deleted

## 2014-09-25 NOTE — Telephone Encounter (Signed)
Physical therapist Glean Salvo called stating that due to her injury she would recommend a hospital bed for patient. For the hospital bed, she would need an order from the MD. Direct number to contact her [phone]. Message sent to MD and Nurse.

## 2014-09-25 NOTE — Telephone Encounter (Signed)
Ok to order a hospital bed

## 2014-09-27 ENCOUNTER — Encounter (HOSPITAL_COMMUNITY): Payer: Self-pay | Admitting: Emergency Medicine

## 2014-09-27 ENCOUNTER — Inpatient Hospital Stay (HOSPITAL_COMMUNITY)
Admission: EM | Admit: 2014-09-27 | Discharge: 2014-10-01 | DRG: 602 | Disposition: A | Discharge status: Skilled Nursing Facility | Payer: Medicare Other | Attending: Internal Medicine | Admitting: Internal Medicine

## 2014-09-27 DIAGNOSIS — R627 Adult failure to thrive: Secondary | ICD-10-CM | POA: Diagnosis present

## 2014-09-27 DIAGNOSIS — Y92009 Unspecified place in unspecified non-institutional (private) residence as the place of occurrence of the external cause: Secondary | ICD-10-CM

## 2014-09-27 DIAGNOSIS — L03311 Cellulitis of abdominal wall: Secondary | ICD-10-CM | POA: Diagnosis not present

## 2014-09-27 DIAGNOSIS — Z79899 Other long term (current) drug therapy: Secondary | ICD-10-CM

## 2014-09-27 DIAGNOSIS — R52 Pain, unspecified: Secondary | ICD-10-CM | POA: Diagnosis not present

## 2014-09-27 DIAGNOSIS — L89323 Pressure ulcer of left buttock, stage 3: Secondary | ICD-10-CM | POA: Diagnosis present

## 2014-09-27 DIAGNOSIS — R799 Abnormal finding of blood chemistry, unspecified: Secondary | ICD-10-CM

## 2014-09-27 DIAGNOSIS — K219 Gastro-esophageal reflux disease without esophagitis: Secondary | ICD-10-CM | POA: Diagnosis present

## 2014-09-27 DIAGNOSIS — C3490 Malignant neoplasm of unspecified part of unspecified bronchus or lung: Secondary | ICD-10-CM | POA: Diagnosis present

## 2014-09-27 DIAGNOSIS — E119 Type 2 diabetes mellitus without complications: Secondary | ICD-10-CM | POA: Diagnosis present

## 2014-09-27 DIAGNOSIS — Z833 Family history of diabetes mellitus: Secondary | ICD-10-CM

## 2014-09-27 DIAGNOSIS — R32 Unspecified urinary incontinence: Secondary | ICD-10-CM | POA: Diagnosis not present

## 2014-09-27 DIAGNOSIS — Z8673 Personal history of transient ischemic attack (TIA), and cerebral infarction without residual deficits: Secondary | ICD-10-CM

## 2014-09-27 DIAGNOSIS — Z923 Personal history of irradiation: Secondary | ICD-10-CM

## 2014-09-27 DIAGNOSIS — Z8249 Family history of ischemic heart disease and other diseases of the circulatory system: Secondary | ICD-10-CM

## 2014-09-27 DIAGNOSIS — D508 Other iron deficiency anemias: Secondary | ICD-10-CM

## 2014-09-27 DIAGNOSIS — B369 Superficial mycosis, unspecified: Secondary | ICD-10-CM | POA: Diagnosis present

## 2014-09-27 DIAGNOSIS — N179 Acute kidney failure, unspecified: Secondary | ICD-10-CM | POA: Diagnosis present

## 2014-09-27 DIAGNOSIS — B9620 Unspecified Escherichia coli [E. coli] as the cause of diseases classified elsewhere: Secondary | ICD-10-CM | POA: Diagnosis present

## 2014-09-27 DIAGNOSIS — C3412 Malignant neoplasm of upper lobe, left bronchus or lung: Secondary | ICD-10-CM | POA: Diagnosis present

## 2014-09-27 DIAGNOSIS — Z7982 Long term (current) use of aspirin: Secondary | ICD-10-CM

## 2014-09-27 DIAGNOSIS — Z79891 Long term (current) use of opiate analgesic: Secondary | ICD-10-CM

## 2014-09-27 DIAGNOSIS — Z8679 Personal history of other diseases of the circulatory system: Secondary | ICD-10-CM

## 2014-09-27 DIAGNOSIS — I129 Hypertensive chronic kidney disease with stage 1 through stage 4 chronic kidney disease, or unspecified chronic kidney disease: Secondary | ICD-10-CM | POA: Diagnosis present

## 2014-09-27 DIAGNOSIS — Z6841 Body Mass Index (BMI) 40.0 and over, adult: Secondary | ICD-10-CM

## 2014-09-27 DIAGNOSIS — N390 Urinary tract infection, site not specified: Secondary | ICD-10-CM | POA: Diagnosis present

## 2014-09-27 DIAGNOSIS — M1990 Unspecified osteoarthritis, unspecified site: Secondary | ICD-10-CM | POA: Diagnosis present

## 2014-09-27 DIAGNOSIS — F17210 Nicotine dependence, cigarettes, uncomplicated: Secondary | ICD-10-CM | POA: Diagnosis present

## 2014-09-27 DIAGNOSIS — M25562 Pain in left knee: Secondary | ICD-10-CM

## 2014-09-27 DIAGNOSIS — B356 Tinea cruris: Secondary | ICD-10-CM

## 2014-09-27 DIAGNOSIS — E860 Dehydration: Secondary | ICD-10-CM | POA: Diagnosis present

## 2014-09-27 DIAGNOSIS — S42302A Unspecified fracture of shaft of humerus, left arm, initial encounter for closed fracture: Secondary | ICD-10-CM | POA: Diagnosis present

## 2014-09-27 DIAGNOSIS — N189 Chronic kidney disease, unspecified: Secondary | ICD-10-CM | POA: Diagnosis present

## 2014-09-27 DIAGNOSIS — L89153 Pressure ulcer of sacral region, stage 3: Secondary | ICD-10-CM

## 2014-09-27 DIAGNOSIS — L03314 Cellulitis of groin: Secondary | ICD-10-CM | POA: Diagnosis present

## 2014-09-27 DIAGNOSIS — W19XXXA Unspecified fall, initial encounter: Secondary | ICD-10-CM

## 2014-09-27 DIAGNOSIS — L89159 Pressure ulcer of sacral region, unspecified stage: Secondary | ICD-10-CM | POA: Diagnosis present

## 2014-09-27 DIAGNOSIS — E785 Hyperlipidemia, unspecified: Secondary | ICD-10-CM | POA: Diagnosis present

## 2014-09-27 DIAGNOSIS — I739 Peripheral vascular disease, unspecified: Secondary | ICD-10-CM | POA: Diagnosis present

## 2014-09-27 DIAGNOSIS — R159 Full incontinence of feces: Secondary | ICD-10-CM | POA: Diagnosis not present

## 2014-09-27 DIAGNOSIS — Z886 Allergy status to analgesic agent status: Secondary | ICD-10-CM

## 2014-09-27 NOTE — ED Notes (Signed)
Patient has moderate redness to entire lower abdomen, several small sores to bilateral inner thighs for unknown time. Rates pain 8/10.

## 2014-09-27 NOTE — ED Notes (Signed)
Patient presents from home via EMS for "bed sores", possible UTI. Patient has Hx of fall two weeks ago, decreased mobility, c/o "bed sores" to sacrum and "foul smelling urine".   VS: 110/84, 70Hr, 16resp

## 2014-09-27 NOTE — Progress Notes (Signed)
CSW met with pt at bedside. Beverly Lewis was present during the end of interview. Patient confirms that she comes from home and has bed sores. Per note, pt may have possible UTI. Patient stated "My bottom hurts." Patient confirms that she has bed sores. She states that she spends a lot of time sitting in a chair. Pt states that when she is not at home she uses a wheel chair. Also, she states that she has a lot of walkers and uses the walkers sometimes when she is home.  Patient informed CSW that she broke her arm about 2 weeks ago, and has not took a bath since. She states that she has been using baby wipes and doing the best that she can.   Patient states that she does not fall often. She states that she does not feel neglected by her family but expressed that she was not happy with her current home health and the fact that they did not come to their scheduled visit.   Patient states that she lives at home and that her 2 grandsons and daughter live with her. Patient informed CSW that she does need assistance at home with her ADL's. Beverly Lewis states that she has home health, but they missed a day to come and see her. He says that her home health is through [place].  [name]/Grandson [place], [place] ED CSW 09/27/2014 11:35 PM

## 2014-09-27 NOTE — ED Notes (Signed)
Bed: QI29 Expected date:  Expected time:  Means of arrival:  Comments: EMS fall 2 weeks ago, dec mobility, bed sores

## 2014-09-28 ENCOUNTER — Encounter (HOSPITAL_COMMUNITY): Payer: Self-pay | Admitting: Emergency Medicine

## 2014-09-28 DIAGNOSIS — Y92009 Unspecified place in unspecified non-institutional (private) residence as the place of occurrence of the external cause: Secondary | ICD-10-CM

## 2014-09-28 DIAGNOSIS — N189 Chronic kidney disease, unspecified: Secondary | ICD-10-CM | POA: Diagnosis present

## 2014-09-28 DIAGNOSIS — Z79899 Other long term (current) drug therapy: Secondary | ICD-10-CM | POA: Diagnosis not present

## 2014-09-28 DIAGNOSIS — W19XXXA Unspecified fall, initial encounter: Secondary | ICD-10-CM | POA: Diagnosis present

## 2014-09-28 DIAGNOSIS — L03314 Cellulitis of groin: Secondary | ICD-10-CM | POA: Diagnosis not present

## 2014-09-28 DIAGNOSIS — Z886 Allergy status to analgesic agent status: Secondary | ICD-10-CM | POA: Diagnosis not present

## 2014-09-28 DIAGNOSIS — S42302S Unspecified fracture of shaft of humerus, left arm, sequela: Secondary | ICD-10-CM | POA: Diagnosis not present

## 2014-09-28 DIAGNOSIS — F17210 Nicotine dependence, cigarettes, uncomplicated: Secondary | ICD-10-CM | POA: Diagnosis present

## 2014-09-28 DIAGNOSIS — L03311 Cellulitis of abdominal wall: Secondary | ICD-10-CM | POA: Diagnosis present

## 2014-09-28 DIAGNOSIS — R32 Unspecified urinary incontinence: Secondary | ICD-10-CM | POA: Diagnosis not present

## 2014-09-28 DIAGNOSIS — B356 Tinea cruris: Secondary | ICD-10-CM

## 2014-09-28 DIAGNOSIS — N179 Acute kidney failure, unspecified: Secondary | ICD-10-CM

## 2014-09-28 DIAGNOSIS — Z79891 Long term (current) use of opiate analgesic: Secondary | ICD-10-CM | POA: Diagnosis not present

## 2014-09-28 DIAGNOSIS — E785 Hyperlipidemia, unspecified: Secondary | ICD-10-CM | POA: Diagnosis present

## 2014-09-28 DIAGNOSIS — C3412 Malignant neoplasm of upper lobe, left bronchus or lung: Secondary | ICD-10-CM | POA: Diagnosis present

## 2014-09-28 DIAGNOSIS — S42302A Unspecified fracture of shaft of humerus, left arm, initial encounter for closed fracture: Secondary | ICD-10-CM | POA: Diagnosis present

## 2014-09-28 DIAGNOSIS — R159 Full incontinence of feces: Secondary | ICD-10-CM | POA: Diagnosis not present

## 2014-09-28 DIAGNOSIS — B369 Superficial mycosis, unspecified: Secondary | ICD-10-CM | POA: Diagnosis present

## 2014-09-28 DIAGNOSIS — Z8673 Personal history of transient ischemic attack (TIA), and cerebral infarction without residual deficits: Secondary | ICD-10-CM | POA: Diagnosis not present

## 2014-09-28 DIAGNOSIS — E119 Type 2 diabetes mellitus without complications: Secondary | ICD-10-CM | POA: Diagnosis present

## 2014-09-28 DIAGNOSIS — I129 Hypertensive chronic kidney disease with stage 1 through stage 4 chronic kidney disease, or unspecified chronic kidney disease: Secondary | ICD-10-CM | POA: Diagnosis present

## 2014-09-28 DIAGNOSIS — L89153 Pressure ulcer of sacral region, stage 3: Secondary | ICD-10-CM | POA: Diagnosis not present

## 2014-09-28 DIAGNOSIS — E860 Dehydration: Secondary | ICD-10-CM | POA: Diagnosis present

## 2014-09-28 DIAGNOSIS — Z923 Personal history of irradiation: Secondary | ICD-10-CM | POA: Diagnosis not present

## 2014-09-28 DIAGNOSIS — L89159 Pressure ulcer of sacral region, unspecified stage: Secondary | ICD-10-CM | POA: Diagnosis present

## 2014-09-28 DIAGNOSIS — C3490 Malignant neoplasm of unspecified part of unspecified bronchus or lung: Secondary | ICD-10-CM | POA: Diagnosis not present

## 2014-09-28 DIAGNOSIS — Z6841 Body Mass Index (BMI) 40.0 and over, adult: Secondary | ICD-10-CM | POA: Diagnosis not present

## 2014-09-28 DIAGNOSIS — R52 Pain, unspecified: Secondary | ICD-10-CM | POA: Diagnosis present

## 2014-09-28 DIAGNOSIS — Z8679 Personal history of other diseases of the circulatory system: Secondary | ICD-10-CM | POA: Diagnosis not present

## 2014-09-28 DIAGNOSIS — M1990 Unspecified osteoarthritis, unspecified site: Secondary | ICD-10-CM | POA: Diagnosis present

## 2014-09-28 DIAGNOSIS — I739 Peripheral vascular disease, unspecified: Secondary | ICD-10-CM | POA: Diagnosis present

## 2014-09-28 DIAGNOSIS — Z7982 Long term (current) use of aspirin: Secondary | ICD-10-CM | POA: Diagnosis not present

## 2014-09-28 DIAGNOSIS — Z833 Family history of diabetes mellitus: Secondary | ICD-10-CM | POA: Diagnosis not present

## 2014-09-28 DIAGNOSIS — B9620 Unspecified Escherichia coli [E. coli] as the cause of diseases classified elsewhere: Secondary | ICD-10-CM | POA: Diagnosis present

## 2014-09-28 DIAGNOSIS — N390 Urinary tract infection, site not specified: Secondary | ICD-10-CM | POA: Diagnosis present

## 2014-09-28 DIAGNOSIS — R627 Adult failure to thrive: Secondary | ICD-10-CM | POA: Diagnosis present

## 2014-09-28 DIAGNOSIS — Z8249 Family history of ischemic heart disease and other diseases of the circulatory system: Secondary | ICD-10-CM | POA: Diagnosis not present

## 2014-09-28 DIAGNOSIS — L89323 Pressure ulcer of left buttock, stage 3: Secondary | ICD-10-CM | POA: Diagnosis present

## 2014-09-28 DIAGNOSIS — K219 Gastro-esophageal reflux disease without esophagitis: Secondary | ICD-10-CM | POA: Diagnosis present

## 2014-09-28 LAB — CBC
HEMATOCRIT: 35.3 % — AB (ref 36.0–46.0)
Hemoglobin: 11.5 g/dL — ABNORMAL LOW (ref 12.0–15.0)
MCH: 31.3 pg (ref 26.0–34.0)
MCHC: 32.6 g/dL (ref 30.0–36.0)
MCV: 96.2 fL (ref 78.0–100.0)
Platelets: 326 10*3/uL (ref 150–400)
RBC: 3.67 MIL/uL — ABNORMAL LOW (ref 3.87–5.11)
RDW: 16.1 % — AB (ref 11.5–15.5)
WBC: 9.6 10*3/uL (ref 4.0–10.5)

## 2014-09-28 LAB — COMPREHENSIVE METABOLIC PANEL
ALBUMIN: 3.4 g/dL — AB (ref 3.5–5.2)
ALT: 73 U/L — ABNORMAL HIGH (ref 0–35)
AST: 45 U/L — AB (ref 0–37)
Alkaline Phosphatase: 281 U/L — ABNORMAL HIGH (ref 39–117)
Anion gap: 13 (ref 5–15)
BUN: 100 mg/dL — ABNORMAL HIGH (ref 6–23)
CALCIUM: 9.8 mg/dL (ref 8.4–10.5)
CO2: 16 mmol/L — ABNORMAL LOW (ref 19–32)
CREATININE: 1.86 mg/dL — AB (ref 0.50–1.10)
Chloride: 111 mmol/L (ref 96–112)
GFR calc Af Amer: 28 mL/min — ABNORMAL LOW (ref >90)
GFR calc non Af Amer: 24 mL/min — ABNORMAL LOW (ref >90)
Glucose, Bld: 111 mg/dL — ABNORMAL HIGH (ref 70–99)
Potassium: 4.8 mmol/L (ref 3.5–5.1)
Sodium: 140 mmol/L (ref 135–145)
Total Bilirubin: 0.8 mg/dL (ref 0.3–1.2)
Total Protein: 8.3 g/dL (ref 6.0–8.3)

## 2014-09-28 LAB — URINALYSIS, ROUTINE W REFLEX MICROSCOPIC
Bilirubin Urine: NEGATIVE
GLUCOSE, UA: NEGATIVE mg/dL
KETONES UR: NEGATIVE mg/dL
NITRITE: POSITIVE — AB
Protein, ur: NEGATIVE mg/dL
Specific Gravity, Urine: 1.017 (ref 1.005–1.030)
Urobilinogen, UA: 0.2 mg/dL (ref 0.0–1.0)
pH: 5 (ref 5.0–8.0)

## 2014-09-28 LAB — GLUCOSE, CAPILLARY
Glucose-Capillary: 77 mg/dL (ref 70–99)
Glucose-Capillary: 89 mg/dL (ref 70–99)
Glucose-Capillary: 93 mg/dL (ref 70–99)

## 2014-09-28 LAB — PROTIME-INR
INR: 1.28 (ref 0.00–1.49)
Prothrombin Time: 16.1 seconds — ABNORMAL HIGH (ref 11.6–15.2)

## 2014-09-28 LAB — URINE MICROSCOPIC-ADD ON

## 2014-09-28 MED ORDER — ONDANSETRON HCL 4 MG PO TABS: 4.0000 mg | ORAL_TABLET | Freq: Four times a day (QID) | ORAL | Status: DC | PRN

## 2014-09-28 MED ORDER — FLUTICASONE PROPIONATE 50 MCG/ACT NA SUSP
1.0000 | Freq: Every day | NASAL | Status: DC
  Administered 2014-09-28 – 2014-10-01 (×4): 1 via NASAL
  Filled 2014-09-28: qty 16

## 2014-09-28 MED ORDER — ATORVASTATIN CALCIUM 20 MG PO TABS
20.0000 mg | ORAL_TABLET | Freq: Every day | ORAL | Status: DC
  Administered 2014-09-28 – 2014-10-01 (×4): 20 mg via ORAL
  Filled 2014-09-28 (×4): qty 1

## 2014-09-28 MED ORDER — ONDANSETRON HCL 4 MG/2ML IJ SOLN: 4.0000 mg | Freq: Four times a day (QID) | INTRAMUSCULAR | Status: DC | PRN

## 2014-09-28 MED ORDER — VANCOMYCIN HCL 10 G IV SOLR
1500.0000 mg | INTRAVENOUS | Status: DC
  Administered 2014-09-28: 1500 mg via INTRAVENOUS
  Filled 2014-09-28: qty 1500

## 2014-09-28 MED ORDER — HYDROCODONE-ACETAMINOPHEN 5-325 MG PO TABS
1.0000 | ORAL_TABLET | Freq: Four times a day (QID) | ORAL | Status: DC | PRN
  Administered 2014-09-28 – 2014-09-29 (×3): 1 via ORAL
  Filled 2014-09-28 (×4): qty 1

## 2014-09-28 MED ORDER — COLCHICINE 0.6 MG PO TABS
0.6000 mg | ORAL_TABLET | Freq: Two times a day (BID) | ORAL | Status: DC | PRN
  Filled 2014-09-28: qty 1

## 2014-09-28 MED ORDER — ACETAMINOPHEN 650 MG RE SUPP: 650.0000 mg | Freq: Four times a day (QID) | RECTAL | Status: DC | PRN

## 2014-09-28 MED ORDER — AMLODIPINE BESYLATE 10 MG PO TABS
10.0000 mg | ORAL_TABLET | Freq: Every day | ORAL | Status: DC
  Administered 2014-09-29 – 2014-10-01 (×3): 10 mg via ORAL
  Filled 2014-09-28 (×4): qty 1

## 2014-09-28 MED ORDER — ENOXAPARIN SODIUM 30 MG/0.3ML ~~LOC~~ SOLN
30.0000 mg | SUBCUTANEOUS | Status: DC
  Administered 2014-09-28 – 2014-09-29 (×2): 30 mg via SUBCUTANEOUS
  Filled 2014-09-28 (×2): qty 0.3

## 2014-09-28 MED ORDER — INSULIN ASPART 100 UNIT/ML ~~LOC~~ SOLN: 0.0000 [IU] | Freq: Every day | SUBCUTANEOUS | Status: DC

## 2014-09-28 MED ORDER — PIPERACILLIN-TAZOBACTAM 3.375 G IVPB 30 MIN
3.3750 g | Freq: Once | INTRAVENOUS | Status: AC
  Administered 2014-09-28: 3.375 g via INTRAVENOUS
  Filled 2014-09-28: qty 50

## 2014-09-28 MED ORDER — ALLOPURINOL 100 MG PO TABS
100.0000 mg | ORAL_TABLET | Freq: Every day | ORAL | Status: DC
  Administered 2014-09-28 – 2014-10-01 (×4): 100 mg via ORAL
  Filled 2014-09-28 (×4): qty 1

## 2014-09-28 MED ORDER — MUSCLE RUB 10-15 % EX CREA
TOPICAL_CREAM | CUTANEOUS | Status: DC | PRN
  Filled 2014-09-28: qty 85

## 2014-09-28 MED ORDER — ALBUTEROL SULFATE (2.5 MG/3ML) 0.083% IN NEBU: 2.5000 mg | INHALATION_SOLUTION | RESPIRATORY_TRACT | Status: DC | PRN

## 2014-09-28 MED ORDER — PANTOPRAZOLE SODIUM 40 MG PO TBEC
40.0000 mg | DELAYED_RELEASE_TABLET | Freq: Every day | ORAL | Status: DC
  Administered 2014-09-28 – 2014-10-01 (×4): 40 mg via ORAL
  Filled 2014-09-28 (×4): qty 1

## 2014-09-28 MED ORDER — SODIUM CHLORIDE 0.9 % IV SOLN
INTRAVENOUS | Status: AC
  Administered 2014-09-28 (×2): via INTRAVENOUS

## 2014-09-28 MED ORDER — ACETAMINOPHEN 325 MG PO TABS: 650.0000 mg | ORAL_TABLET | Freq: Four times a day (QID) | ORAL | Status: DC | PRN

## 2014-09-28 MED ORDER — INSULIN ASPART 100 UNIT/ML ~~LOC~~ SOLN
0.0000 [IU] | Freq: Three times a day (TID) | SUBCUTANEOUS | Status: DC
Start: 2014-09-28 — End: 2014-10-02

## 2014-09-28 MED ORDER — ASPIRIN EC 81 MG PO TBEC
81.0000 mg | DELAYED_RELEASE_TABLET | Freq: Every day | ORAL | Status: DC
  Administered 2014-09-28 – 2014-10-01 (×4): 81 mg via ORAL
  Filled 2014-09-28 (×4): qty 1

## 2014-09-28 MED ORDER — FLUCONAZOLE 100 MG PO TABS
100.0000 mg | ORAL_TABLET | Freq: Every day | ORAL | Status: DC
  Administered 2014-09-28 – 2014-10-01 (×4): 100 mg via ORAL
  Filled 2014-09-28 (×4): qty 1

## 2014-09-28 MED ORDER — FOLIC ACID 1 MG PO TABS
1.0000 mg | ORAL_TABLET | Freq: Every day | ORAL | Status: DC
  Administered 2014-09-28 – 2014-10-01 (×4): 1 mg via ORAL
  Filled 2014-09-28 (×4): qty 1

## 2014-09-28 NOTE — ED Provider Notes (Signed)
CSN: [id number]     Arrival date & time 09/27/14  2200 History   First MD Initiated Contact with Patient 09/28/14 0301     Chief Complaint  Patient presents with  . Skin Problem     (Consider location/radiation/quality/duration/timing/severity/associated sxs/prior Treatment) Patient is a 81 y.o. female presenting with rash. The history is provided by the patient. No language interpreter was used.  Rash Location:  Ano-genital Ano-genital rash location:  L buttock, R buttock, perineum and groin Quality: peeling   Quality: not swelling   Severity:  Moderate Onset quality:  Gradual Timing:  Constant Progression:  Worsening Chronicity:  New Context: not animal contact   Relieved by:  Nothing Worsened by:  Nothing tried Ineffective treatments:  None tried Associated symptoms: no periorbital edema, no tongue swelling and not wheezing     Past Medical History  Diagnosis Date  . Hypertension   . Hyperlipidemia   . Cerebrovascular accident history of mild cerebrovascular accident which caused some visual disturbance.   . Diabetes mellitus   . Morbid obesity   . Lung mass     Right upper Lobe  . Shortness of breath   . Pneumonia     hx  . Peripheral vascular disease     hx blood clot 30 yrs ago leg  . GERD (gastroesophageal reflux disease)   . Arthritis   . Pneumothorax, iatrogenic 8/2/113-discharge note    Post Biospy  . Cough with hemoptysis 01/22/12    Prior to discharge  . Tachycardia 01/22/12    Prior to discharge  . History of radiation therapy 02/18/12,02/23/12,02/25/12., 03/01/12,&03/03/12    RULlung 50Gy/5/fx  . Lung cancer 01/21/12   Past Surgical History  Procedure Laterality Date  . Abdominal aortic aneurysm repair  06/28/10    Stent Graft repair    . Exploratory laparotomy      For ectopic pregnancy  . Lumbar disc surgery    . Aortogram   03/03/11    with stenting , left external iliac artery  . Appendectomy    . Navigational bronchoscopy      Right Upper Lobe  Mass with Biopsies, Brushings, Washings, and bronchoalveolar Lavage:  . Paratracheal adenopathy and bilateral adrenal nodules     Family History  Problem Relation Age of Onset  . Heart disease Mother   . Hypertension Mother   . Diabetes Mother   . Hyperlipidemia Mother   . Heart disease Father   . Hypertension Father   . Diabetes Father   . Hyperlipidemia Father   . Diabetes Daughter    History  Substance Use Topics  . Smoking status: Current Every Day Smoker -- 0.50 packs/day for 65 years    Types: Cigarettes  . Smokeless tobacco: Never Used  . Alcohol Use: No     Comment: She does not consume alcohol on a regular basis.   OB History    No data available     Review of Systems  Respiratory: Negative for wheezing.   Skin: Positive for rash.  All other systems reviewed and are negative.     Allergies  Asa buff (mag  Home Medications   Prior to Admission medications   Medication Sig Start Date End Date Taking? Authorizing Provider  acetaminophen (TYLENOL) 500 MG tablet Take 500 mg by mouth every 6 (six) hours as needed for moderate pain (inflammation).   Yes Historical Provider, MD  allopurinol (ZYLOPRIM) 100 MG tablet Take 100 mg by mouth daily.    Yes Historical Provider, MD  amLODipine (NORVASC) 10 MG tablet Take 10 mg by mouth daily.     Yes Historical Provider, MD  aspirin EC 81 MG tablet Take 81 mg by mouth daily.   Yes Historical Provider, MD  atorvastatin (LIPITOR) 20 MG tablet Take 20 mg by mouth daily.     Yes Historical Provider, MD  COLCRYS 0.6 MG tablet Take 0.6 mg by mouth 2 (two) times daily as needed (gout). For gout 11/03/11  Yes Historical Provider, MD  eprosartan-hydrochlorothiazide (TEVETEN HCT) 600-12.5 MG per tablet Take 1 tablet by mouth daily.    Yes Historical Provider, MD  Ergocalciferol (VITAMIN D2) 2000 UNITS TABS Take 1 tablet by mouth daily.   Yes Historical Provider, MD  fluticasone (FLONASE) 50 MCG/ACT nasal spray Place 1 spray into both  nostrils daily.  04/02/14  Yes Historical Provider, MD  folic acid (FOLVITE) 1 MG tablet Take 1 mg by mouth daily.   Yes Historical Provider, MD  HYDROcodone-acetaminophen (NORCO/VICODIN) 5-325 MG per tablet Take 1-2 tablets by mouth every 6 (six) hours as needed. 09/10/14  Yes Quintella Reichert, MD  JANUVIA 100 MG tablet Take 100 mg by mouth daily.  03/11/11  Yes Historical Provider, MD  NEXIUM 40 MG capsule Take 40 mg by mouth daily before breakfast.  01/09/11  Yes Historical Provider, MD  dexamethasone (DECADRON) 4 MG tablet Take 4 mg by mouth 2 (two) times daily with a meal. Take one tablet bid the day before , the day of and the day after chemo    Historical Provider, MD  furosemide (LASIX) 20 MG tablet Take 20 mg by mouth daily as needed for edema (edema).  01/05/14   Historical Provider, MD   BP 135/43 mmHg  Pulse 84  Temp(Src) 98 F (36.7 C) (Oral)  Resp 18  SpO2 98% Physical Exam  Constitutional: She is oriented to person, place, and time. She appears well-developed and well-nourished. No distress.  HENT:  Head: Normocephalic and atraumatic.  Tacky mucus membranes  Eyes: Conjunctivae are normal. Pupils are equal, round, and reactive to light.  Neck: Normal range of motion. Neck supple.  Cardiovascular: Normal rate, regular rhythm and intact distal pulses.   Pulmonary/Chest: Effort normal. No respiratory distress. She has no wheezes. She has no rales.  Abdominal: Soft. Bowel sounds are normal. There is no tenderness. There is no rebound and no guarding.  Musculoskeletal: Normal range of motion.  Neurological: She is alert and oriented to person, place, and time.  Skin:     Psychiatric: She has a normal mood and affect.    ED Course  Procedures (including critical care time) Labs Review Labs Reviewed  CBC - Abnormal; Notable for the following:    RBC 3.67 (*)    Hemoglobin 11.5 (*)    HCT 35.3 (*)    RDW 16.1 (*)    All other components within normal limits  COMPREHENSIVE  METABOLIC PANEL - Abnormal; Notable for the following:    CO2 16 (*)    Glucose, Bld 111 (*)    BUN 100 (*)    Creatinine, Ser 1.86 (*)    Albumin 3.4 (*)    AST 45 (*)    ALT 73 (*)    Alkaline Phosphatase 281 (*)    GFR calc non Af Amer 24 (*)    GFR calc Af Amer 28 (*)    All other components within normal limits  PROTIME-INR - Abnormal; Notable for the following:    Prothrombin Time 16.1 (*)  All other components within normal limits    Imaging Review No results found.   EKG Interpretation None      MDM   Final diagnoses:  None    Markedly elevated BUN and increased creatinine.  decubs likely from sitting in own urine.  Will need admission with abx and antifungals and hydration  Admit to inpatient med surg per Dr. Alcario Drought    Veatrice Kells, MD 09/28/14 [phone]

## 2014-09-28 NOTE — Progress Notes (Signed)
Attempted to flush IV. IV is occluded. IV team consult initiated. Zosyn on hold.

## 2014-09-28 NOTE — Progress Notes (Signed)
EDCM spoke to patient at bedside.  Patient reports she lives at home with her daughter and her grandchildren.  Patient reports her on e grandson helps her a lot named [employer], dispensing.  Patient reports she has home health services with [facility] for a RN, PT and an aide, but she said the aide never showed up to give her a bath.  Patient reports she has a walker, which is borrowed, cane, bedside commode and wheelchair at home.  Patient reports she is usually able to walk at home without the use of any dme and now has to hold on to her grandson's arm to walk to the bathroom.  Patient noted to be wearing sling to her left arm.  Patient's pcp listed is Dr. Jeanie Cooks.  Patient is followed by Dr. Earlie Server at [facility].  Per chart review, hospital bed has been ordered for patient through [facility].  No further EDCM needs at this time.

## 2014-09-28 NOTE — H&P (Signed)
History and Physical  Beverly Lewis JXB:[id number] DOB: 03-28-1929 DOA: 09/27/2014  Referring physician: Dr. Veatrice Kells, EDP PCP: [name], MD  Outpatient Specialists:  1. Medical Oncology: Dr. Curt Bears  Chief Complaint: Painful buttock sores.  HPI: Beverly Lewis is a 81 y.o. female, lives at home with daughter and 2 grandsons, apparently has DME (walker and wheelchair) but ambulates either independently or with assistance from family, PMH of unresectable stage IIIa non-small cell lung cancer-not a candidate for radiotherapy, treated with carboplatin and Alimta 6 cycles, was on observation for 3 months but recent CT showed disease progression and was supposed to resume systemic chemotherapy with Alimta but missed an appointment secondary to fall and left humerus fracture, DM, HTN, HLD, CVA, GERD, tobacco abuse, presented with complaints of painful sores on her bottom. She states that since she fell and broke her left arm, she has been spending much of her time sitting on the chair and sleeps in her chair. She denies stool or urinary incontinence. However she has not been able to care for herself i.e. bathe regularly. Since then she has noted painful sores on her bottom and pain and redness in her lower abdomen. She complains of chills and subjective fevers.? [place] BM yesterday. No nausea, vomiting. Appetite decreased. No significant weight loss. Intermittent dyspnea and nonproductive cough but denies chest pain. In the ED, evaluation showed bicarbonate 16, BUN 100, creatinine 1.86, mildly abnormal LFTs, hemoglobin 11.5. Hospitalist admission was requested for management of dehydration, acute on chronic renal failure, sacral decubitus and cellulitis + fungal infection of groin and lower abdomen.  Review of Systems: All systems reviewed and apart from history of presenting illness, are negative.  Past Medical History  Diagnosis Date  . Hypertension   .  Hyperlipidemia   . Cerebrovascular accident history of mild cerebrovascular accident which caused some visual disturbance.   . Diabetes mellitus   . Morbid obesity   . Lung mass     Right upper Lobe  . Shortness of breath   . Pneumonia     hx  . Peripheral vascular disease     hx blood clot 30 yrs ago leg  . GERD (gastroesophageal reflux disease)   . Arthritis   . Pneumothorax, iatrogenic 8/2/113-discharge note    Post Biospy  . Cough with hemoptysis 01/22/12    Prior to discharge  . Tachycardia 01/22/12    Prior to discharge  . History of radiation therapy 02/18/12,02/23/12,02/25/12., 03/01/12,&03/03/12    RULlung 50Gy/5/fx  . Lung cancer 01/21/12   Past Surgical History  Procedure Laterality Date  . Abdominal aortic aneurysm repair  06/28/10    Stent Graft repair    . Exploratory laparotomy      For ectopic pregnancy  . Lumbar disc surgery    . Aortogram   03/03/11    with stenting , left external iliac artery  . Appendectomy    . Navigational bronchoscopy      Right Upper Lobe Mass with Biopsies, Brushings, Washings, and bronchoalveolar Lavage:  . Paratracheal adenopathy and bilateral adrenal nodules     Social History:  reports that she has been smoking Cigarettes.  She has a 32.5 pack-year smoking history. She has never used smokeless tobacco. She reports that she does not drink alcohol or use illicit drugs. Widowed. Lives with family.  Allergies  Allergen Reactions  . Asa Buff (Mag [Buffered Aspirin]     In tolerance in higher dosages.    Family History  Problem Relation Age of Onset  . Heart disease Mother   . Hypertension Mother   . Diabetes Mother   . Hyperlipidemia Mother   . Heart disease Father   . Hypertension Father   . Diabetes Father   . Hyperlipidemia Father   . Diabetes Daughter     Prior to Admission medications   Medication Sig Start Date End Date Taking? Authorizing Provider  acetaminophen (TYLENOL) 500 MG tablet Take 500 mg by mouth every 6 (six)  hours as needed for moderate pain (inflammation).   Yes Historical Provider, MD  allopurinol (ZYLOPRIM) 100 MG tablet Take 100 mg by mouth daily.    Yes Historical Provider, MD  amLODipine (NORVASC) 10 MG tablet Take 10 mg by mouth daily.     Yes Historical Provider, MD  aspirin EC 81 MG tablet Take 81 mg by mouth daily.   Yes Historical Provider, MD  atorvastatin (LIPITOR) 20 MG tablet Take 20 mg by mouth daily.     Yes Historical Provider, MD  COLCRYS 0.6 MG tablet Take 0.6 mg by mouth 2 (two) times daily as needed (gout). For gout 11/03/11  Yes Historical Provider, MD  eprosartan-hydrochlorothiazide (TEVETEN HCT) 600-12.5 MG per tablet Take 1 tablet by mouth daily.    Yes Historical Provider, MD  Ergocalciferol (VITAMIN D2) 2000 UNITS TABS Take 1 tablet by mouth daily.   Yes Historical Provider, MD  fluticasone (FLONASE) 50 MCG/ACT nasal spray Place 1 spray into both nostrils daily.  04/02/14  Yes Historical Provider, MD  folic acid (FOLVITE) 1 MG tablet Take 1 mg by mouth daily.   Yes Historical Provider, MD  HYDROcodone-acetaminophen (NORCO/VICODIN) 5-325 MG per tablet Take 1-2 tablets by mouth every 6 (six) hours as needed. 09/10/14  Yes Quintella Reichert, MD  JANUVIA 100 MG tablet Take 100 mg by mouth daily.  03/11/11  Yes Historical Provider, MD  NEXIUM 40 MG capsule Take 40 mg by mouth daily before breakfast.  01/09/11  Yes Historical Provider, MD  dexamethasone (DECADRON) 4 MG tablet Take 4 mg by mouth 2 (two) times daily with a meal. Take one tablet bid the day before , the day of and the day after chemo    Historical Provider, MD  furosemide (LASIX) 20 MG tablet Take 20 mg by mouth daily as needed for edema (edema).  01/05/14   Historical Provider, MD   Physical Exam: Filed Vitals:   09/27/14 2203 09/28/14 0133 09/28/14 0529 09/28/14 0659  BP: 97/38 135/43 140/62 99/51  Pulse: 89 84 86 105  Temp: 98 F (36.7 C)   97.6 F (36.4 C)  TempSrc: Oral   Oral  Resp: 18 18 18 20   SpO2: 97% 98%  99% 100%     General exam: Moderately built and nourished pleasant elderly patient, lying comfortably supine in bed in no obvious distress.  Head, eyes and ENT: Nontraumatic and normocephalic. Pupils equally reacting to light and accommodation. Oral mucosa dry.  Neck: Supple. No JVD, carotid bruit or thyromegaly.  Lymphatics: No lymphadenopathy.  Respiratory system: Clear to auscultation. No increased work of breathing.  Cardiovascular system: S1 and S2 heard, RRR. No JVD, murmurs, gallops, clicks. Trace bilateral pedal edema.  Gastrointestinal system: Abdomen is nondistended, soft and nontender. Normal bowel sounds heard. No organomegaly or masses appreciated.   Central nervous system: Alert and oriented. No focal neurological deficits.  Extremities: Symmetric 5 x 5 power. Peripheral pulses symmetrically felt.   Skin: Patient has erythema lower mid abdominal region extending into bilateral  groin, medial upper thighs and perineum suspicious for cellulitis and fungal infection of skin. 2 tiny (approximately 0.5 cm) midline subumbilical wounds which are not actively draining. She has 4-5 stage III sacral decubitus ulcers of the ring sizes on bilateral sacrum, posterior bilateral upper thighs which do not look grossly infected  Musculoskeletal system: Negative exam.  Psychiatry: Pleasant and cooperative.   Labs on Admission:  Basic Metabolic Panel:  Recent Labs Lab 09/28/14 0008  NA 140  K 4.8  CL 111  CO2 16*  GLUCOSE 111*  BUN 100*  CREATININE 1.86*  CALCIUM 9.8   Liver Function Tests:  Recent Labs Lab 09/28/14 0008  AST 45*  ALT 73*  ALKPHOS 281*  BILITOT 0.8  PROT 8.3  ALBUMIN 3.4*   No results for input(s): LIPASE, AMYLASE in the last 168 hours. No results for input(s): AMMONIA in the last 168 hours. CBC:  Recent Labs Lab 09/28/14 0008  WBC 9.6  HGB 11.5*  HCT 35.3*  MCV 96.2  PLT 326   Cardiac Enzymes: No results for input(s): CKTOTAL, CKMB,  CKMBINDEX, TROPONINI in the last 168 hours.  BNP (last 3 results) No results for input(s): PROBNP in the last 8760 hours. CBG: No results for input(s): GLUCAP in the last 168 hours.  Radiological Exams on Admission: No results found.  EKG: None seen in Epic for today.  Assessment/Plan   81 y.o. female, lives at home with daughter and 2 grandsons, apparently has DME (walker and wheelchair) but ambulates either independently or with assistance from family, PMH of unresectable stage IIIa non-small cell lung cancer-not a candidate for radiotherapy, treated with carboplatin and Alimta 6 cycles, was on observation for 3 months but recent CT showed disease progression and was supposed to resume systemic chemotherapy with Alimta but missed an appointment secondary to fall and left humerus fracture, DM, HTN, HLD, CVA, GERD, tobacco abuse, presented with complaints of painful sores on her bottom. Recent fall and left humerus fracture. Admitted now with cellulitis complicating fungal infection of groin, multiple sacral decubitus ulcers, dehydration and acute on chronic kidney disease.  Principal Problem:   Cellulitis of groin complicated by fungal infection - Likely precipitated by inadequate hygiene - Treat empirically with IV vancomycin and fluconazole - Monitor closely.  Active Problems:   Fungal infection of the groin - Management as above.    Sacral decubitus ulcer - Secondary to constantly sitting on chair. Patient denies urinary or fecal incontinence. - Wounds appear clean. - WOC see consultation. - Would like to avoid Foley since patient is able to get up to toilet or bedside commode.    Dehydration - Temporarily hold Lasix. - Brief IV fluids and oral intake as tolerated.    Renal failure (ARF), acute on chronic stage III - Baseline creatinine probably in the 1.4 range. - Current acute renal failure secondary to poor oral intake, Lasix and ARB-hold - Brief IV fluids and follow  BMP    Cancer of upper lobe of left lung - Outpatient follow-up with oncology    Fracture of left humerus - Continue left upper extremity sling and nonweightbearing on left upper extremity - She was seen in ED on 3/21 for same and EDP discussed with orthopedics (no name or service documented) - Outpatient follow-up with orthopedics    Fall at home - On 3/21, seen in ED. No reported falls since. - PT and OT evaluation    Failure to thrive in adult - Multifactorial.     Code Status:  Full  Family Communication: None at bedside  Disposition Plan: DC home in 2-3 days   Time spent: 68 minutes  Melena Hayes, MD, FACP, FHM. [facility] Pager [phone]  If 7PM-7AM, please contact night-coverage [email or web address] Password [facility] 09/28/2014, 8:43 AM

## 2014-09-28 NOTE — ED Notes (Signed)
Two unsuccessful IV attempts.

## 2014-09-28 NOTE — Progress Notes (Signed)
ANTIBIOTIC CONSULT NOTE - INITIAL  Pharmacy Consult for vancomycin, Diflucan Indication: cellulitis/fungal skin infection  Allergies  Allergen Reactions  . Asa Buff (Mag [Buffered Aspirin]     In tolerance in higher dosages.    Patient Measurements: Weight: 222 lb 0.1 oz (100.7 kg)  Vital Signs: Temp: 97.6 F (36.4 C) (04/08 0659) Temp Source: Oral (04/08 0659) BP: 99/51 mmHg (04/08 0659) Pulse Rate: 105 (04/08 0659) Intake/Output from previous day:   Intake/Output from this shift: Total I/O In: -  Out: 75 [Urine:75]  Labs:  Recent Labs  09/28/14 0008  WBC 9.6  HGB 11.5*  PLT 326  CREATININE 1.86*   Estimated Creatinine Clearance: 25.4 mL/min (by C-G formula based on Cr of 1.86). No results for input(s): VANCOTROUGH, VANCOPEAK, VANCORANDOM, GENTTROUGH, GENTPEAK, GENTRANDOM, TOBRATROUGH, TOBRAPEAK, TOBRARND, AMIKACINPEAK, AMIKACINTROU, AMIKACIN in the last 72 hours.   Microbiology: No results found for this or any previous visit (from the past 720 hour(s)).  Medical History: Past Medical History  Diagnosis Date  . Hypertension   . Hyperlipidemia   . Cerebrovascular accident history of mild cerebrovascular accident which caused some visual disturbance.   . Diabetes mellitus   . Morbid obesity   . Lung mass     Right upper Lobe  . Shortness of breath   . Pneumonia     hx  . Peripheral vascular disease     hx blood clot 30 yrs ago leg  . GERD (gastroesophageal reflux disease)   . Arthritis   . Pneumothorax, iatrogenic 8/2/113-discharge note    Post Biospy  . Cough with hemoptysis 01/22/12    Prior to discharge  . Tachycardia 01/22/12    Prior to discharge  . History of radiation therapy 02/18/12,02/23/12,02/25/12., 03/01/12,&03/03/12    RULlung 50Gy/5/fx  . Lung cancer 01/21/12    Medications:  Scheduled:  . enoxaparin (LOVENOX) injection  30 mg Subcutaneous Q24H  . insulin aspart  0-5 Units Subcutaneous QHS  . insulin aspart  0-9 Units Subcutaneous TID WC    Infusions:  . sodium chloride     Assessment: 81 yo presented to ER early 4/8 with painful buttocks sores that developed after recently breaking arm after a fall and spending much of her time sitting on chair. PMH includes HTN, HLP, CVA, unresectable stage IIIa NSCLC not candidate for radiotherapy s/p carbo/alimta x 6 cycles. Patient was to continue Alimta alone but missed cycle 1 that was supposed to have been given 3/28 after fall/fracture. To start vancomycin and Diflucan per pharmacy for cellulitis/fungal infection on 4/8  4/8 >> vancomycin >> 4/8 >> PO Diflucan >>    Afebrile WBC 9.6 SCr 1.86, elevated. CrCl 26 N  4/8 urine: pending  Goal of Therapy:  Vancomycin Trough 10-15 mcg/mL  Plan:  1) Vancomycin 1500mg  IV q48 per weight and renal function per nomogram 2) Diflucan 100mg  PO daily   Adrian Saran, PharmD, BCPS Pager [phone] 09/28/2014 9:42 AM

## 2014-09-28 NOTE — ED Notes (Signed)
Delay in zosyn start due to no IV access.

## 2014-09-28 NOTE — ED Notes (Signed)
Upon changing bed linens and chucks, patient very vocal about not wanting help and wanting to "roll over myself". Patient complete linen change, barrier cream applied, clean chucks applied.

## 2014-09-28 NOTE — Consult Note (Signed)
WOC wound consult note It is unclear on this patients home situation, she reports walking independently, however the appearance of her buttocks, thighs would suggest she has been immobile and incontinent.  She does not endorse incontinence.  She is very tender to touch over this area.  Reason for Consult: evaluation of skin breakdown Wound type: MASD(moisture associated skin damage) gluteal cleft, inner thighs, buttocks IAD: pannus and groin Stage III Pressure ulcer left ischium  Pressure Ulcer POA: Yes Measurement: several full thickness areas on the inner gluteal cleft 0.5cm x 0.5cm x 0.2cm, larger area left ischium 4cm x 1.5cm x 0.2cm, posterior thighs left 3.5cm x 4.0cm x 0.1cm, unable to visualize right posterior thigh well due to pain with movement.  Wound PVX:YIAX ischium: 90% yellow, posterior thighs pink, moist, areas in the gluteal cleft area pink, moist, partial thickness  Intense redness under the pannus and tender to my touch during assessment, large amounts of antifungal ointments and powders currently in place.  Drainage (amount, consistency, odor) unable to assess due to moisture.  Periwound: Dressing procedure/placement/frequency: Add air mattress for moisture management and this patient currently is quite immobile Add Interdry Ag+ under pannus for IAD Consider FC for management of incontinence until we can determine her mobility.  Silicone foam to protect pressure ulcer and other skin breakdown unless they are staying saturated with urine, then to use zinc based barrier cream instead.  Discussed POC with patient and bedside nurse.  Re consult if needed, will not follow at this time. Thanks  Luise Yamamoto [place], [place] [phone])

## 2014-09-29 DIAGNOSIS — S42302S Unspecified fracture of shaft of humerus, left arm, sequela: Secondary | ICD-10-CM

## 2014-09-29 LAB — GLUCOSE, CAPILLARY
GLUCOSE-CAPILLARY: 78 mg/dL (ref 70–99)
GLUCOSE-CAPILLARY: 71 mg/dL (ref 70–99)
GLUCOSE-CAPILLARY: 94 mg/dL (ref 70–99)
Glucose-Capillary: 89 mg/dL (ref 70–99)

## 2014-09-29 LAB — BASIC METABOLIC PANEL
ANION GAP: 8 (ref 5–15)
BUN: 67 mg/dL — ABNORMAL HIGH (ref 6–23)
CALCIUM: 8.7 mg/dL (ref 8.4–10.5)
CO2: 17 mmol/L — AB (ref 19–32)
Chloride: 116 mmol/L — ABNORMAL HIGH (ref 96–112)
Creatinine, Ser: 1.27 mg/dL — ABNORMAL HIGH (ref 0.50–1.10)
GFR calc non Af Amer: 38 mL/min — ABNORMAL LOW (ref >90)
GFR, EST AFRICAN AMERICAN: 44 mL/min — AB (ref >90)
Glucose, Bld: 82 mg/dL (ref 70–99)
Potassium: 4.7 mmol/L (ref 3.5–5.1)
Sodium: 141 mmol/L (ref 135–145)

## 2014-09-29 LAB — CBC
HCT: 30.8 % — ABNORMAL LOW (ref 36.0–46.0)
HEMOGLOBIN: 10 g/dL — AB (ref 12.0–15.0)
MCH: 31.1 pg (ref 26.0–34.0)
MCHC: 32.5 g/dL (ref 30.0–36.0)
MCV: 95.7 fL (ref 78.0–100.0)
Platelets: 273 10*3/uL (ref 150–400)
RBC: 3.22 MIL/uL — ABNORMAL LOW (ref 3.87–5.11)
RDW: 16 % — ABNORMAL HIGH (ref 11.5–15.5)
WBC: 6.5 10*3/uL (ref 4.0–10.5)

## 2014-09-29 MED ORDER — ENOXAPARIN SODIUM 60 MG/0.6ML ~~LOC~~ SOLN
50.0000 mg | SUBCUTANEOUS | Status: DC
  Administered 2014-09-30: 50 mg via SUBCUTANEOUS
  Filled 2014-09-29: qty 0.6

## 2014-09-29 MED ORDER — VANCOMYCIN HCL 10 G IV SOLR
1250.0000 mg | INTRAVENOUS | Status: DC
  Filled 2014-09-29: qty 1250

## 2014-09-29 MED ORDER — SODIUM CHLORIDE 0.9 % IV SOLN
3.0000 g | Freq: Three times a day (TID) | INTRAVENOUS | Status: DC
  Administered 2014-09-29 – 2014-10-01 (×6): 3 g via INTRAVENOUS
  Filled 2014-09-29 (×10): qty 3

## 2014-09-29 MED ORDER — ENOXAPARIN SODIUM 40 MG/0.4ML ~~LOC~~ SOLN: 40.0000 mg | SUBCUTANEOUS | Status: DC

## 2014-09-29 MED ORDER — HYDROCODONE-ACETAMINOPHEN 5-325 MG PO TABS
1.0000 | ORAL_TABLET | Freq: Four times a day (QID) | ORAL | Status: DC | PRN
  Administered 2014-09-29 – 2014-10-01 (×8): 2 via ORAL
  Filled 2014-09-29 (×3): qty 2
  Filled 2014-09-29: qty 1
  Filled 2014-09-29 (×2): qty 2
  Filled 2014-09-29: qty 1
  Filled 2014-09-29 (×2): qty 2

## 2014-09-29 NOTE — Progress Notes (Addendum)
ANTIBIOTIC CONSULT NOTE - Follow Up  Pharmacy Consult for vancomycin Indication: cellulitis/fungal skin infection  Allergies  Allergen Reactions  . Asa Buff (Mag [Buffered Aspirin]     In tolerance in higher dosages.    Patient Measurements: Weight: 222 lb 0.1 oz (100.7 kg)  Vital Signs: Temp: 98.6 F (37 C) (04/09 0525) Temp Source: Oral (04/09 0525) BP: 142/81 mmHg (04/09 0525) Pulse Rate: 101 (04/09 0525) Intake/Output from previous day: 04/08 0701 - 04/09 0700 In: -  Out: 2694 [Urine:1275] Intake/Output from this shift:    Labs:  Recent Labs  09/28/14 0008 09/29/14 0430  WBC 9.6 6.5  HGB 11.5* 10.0*  PLT 326 273  CREATININE 1.86* 1.27*   Estimated Creatinine Clearance: 37.2 mL/min (by C-G formula based on Cr of 1.27). No results for input(s): VANCOTROUGH, VANCOPEAK, VANCORANDOM, GENTTROUGH, GENTPEAK, GENTRANDOM, TOBRATROUGH, TOBRAPEAK, TOBRARND, AMIKACINPEAK, AMIKACINTROU, AMIKACIN in the last 72 hours.   Microbiology: Recent Results (from the past 720 hour(s))  Urine culture     Status: None (Preliminary result)   Collection Time: 09/28/14  9:00 AM  Result Value Ref Range Status   Specimen Description URINE, RANDOM  Final   Special Requests NONE  Final   Colony Count   Final    >=100,000 COLONIES/ML Performed at [facility]    Culture   Final    ESCHERICHIA COLI Performed at [facility]    Report Status PENDING  Incomplete    Medical History: Past Medical History  Diagnosis Date  . Hypertension   . Hyperlipidemia   . Cerebrovascular accident history of mild cerebrovascular accident which caused some visual disturbance.   . Diabetes mellitus   . Morbid obesity   . Lung mass     Right upper Lobe  . Shortness of breath   . Pneumonia     hx  . Peripheral vascular disease     hx blood clot 30 yrs ago leg  . GERD (gastroesophageal reflux disease)   . Arthritis   . Pneumothorax, iatrogenic 8/2/113-discharge note    Post  Biospy  . Cough with hemoptysis 01/22/12    Prior to discharge  . Tachycardia 01/22/12    Prior to discharge  . History of radiation therapy 02/18/12,02/23/12,02/25/12., 03/01/12,&03/03/12    RULlung 50Gy/5/fx  . Lung cancer 01/21/12    Medications:  Scheduled:  . allopurinol  100 mg Oral Daily  . amLODipine  10 mg Oral Daily  . aspirin EC  81 mg Oral Daily  . atorvastatin  20 mg Oral Daily  . enoxaparin (LOVENOX) injection  30 mg Subcutaneous Q24H  . fluconazole  100 mg Oral Daily  . fluticasone  1 spray Each Nare Daily  . folic acid  1 mg Oral Daily  . insulin aspart  0-5 Units Subcutaneous QHS  . insulin aspart  0-9 Units Subcutaneous TID WC  . pantoprazole  40 mg Oral Daily  . vancomycin  1,500 mg Intravenous Q48H   Infusions:    Assessment: 82 yo presented to ER early 4/8 with painful buttocks sores that developed after recently breaking arm after a fall and spending much of her time sitting on chair. PMH includes HTN, HLP, CVA, unresectable stage IIIa NSCLC not candidate for radiotherapy s/p carbo/alimta x 6 cycles. Patient was to continue Alimta alone but missed cycle 1 that was supposed to have been given 3/28 after fall/fracture. To start vancomycin and Diflucan per pharmacy for cellulitis/fungal infection on 4/8  4/8 >> vancomycin >> 4/8 >> PO  Diflucan >>    Afebrile WBC improved 9.6 > 6.5 SCr improved 1.86 > 1.27, CrCl 37 ml/min CG and normalized  4/8 urine: >100k E.coli (sensitivities pending)  Goal of Therapy:  Vancomycin Trough 10-15 mcg/mL  Plan:   Increase vancomycin 1250mg  IV q24h Check trough at steady state Add gram negative coverage for E.coli in urine?  Peggyann Juba, PharmD, BCPS Pager: [phone]  09/29/2014 12:32 PM   Addendum: Vancomycin dc'd Add Unasyn 3g IV q8h for wound infection, should also cover E.coli in urine if patient becomes symptomatic.  Peggyann Juba, PharmD, BCPS 09/29/2014 3:25 PM

## 2014-09-29 NOTE — Progress Notes (Addendum)
[facility] Progress Note   Beverly Lewis EVO:[id number] DOB: 1929-04-01 DOA: 09/27/2014 PCP: [name], MD  Brief narrative: Beverly Lewis is a 81 y.o. female with past medical history of diabetes mellitus, hypertension, hyperlipidemia, tobacco abuse unresectable stage III non-small cell lung cancer on chemotherapy. She was supposed to resume chemotherapy but missed her appointment due to left humerus comminuted fracture on 3/22. Since the fracture she has not been as mobile as before and has been sitting in a "rest chair" at home. She presents to the hospital for painful ulcers on the perianal, lower abdominal and buttock area. I have spoken with her grandson personally. He has been helping her up to go to the bathroom every day when he is not there, his brother is there. They have not noticed that she has been urinating on herself and he states that it appears that this chair that she has sitting in has been dry. He is not sure if she is wiping herself properly in the bathroom as he does not assist with this. He states when she was seen in the ER, she was noted to have feces on her bottom.    Subjective: Has no complaints of pain at rest but when moved to look at her ulcers, she is in severe pain  Assessment/Plan: Principal Problem:   Cellulitis of groin, lower abdomen and buttocks with ulcers-   Fungal infection of the groin - cont IV Vancomycin and Diflucan - she is currently incontinent of urine and stool although apparently she was not at home- will need foley placement as least for now - Wound care consult appreciated-  have spoken with grandson- understandably,  he is not comfortable doing dressings in this area for his grandmother- looking into other options  Active Problems:   Cancer of upper lobe of left lung/ stage 3 non-small cell cancer - missed appt as she refuses to go down the stairs at home since her fall on 3/21 - will need f/u with Dr Julien Nordmann as soon  as able to continue chemo- has an appt for 4/18    Dehydration   Renal failure (ARF), acute on chronic - lasix on hold- follow Cr which is improving    Fracture of left humerus due to fall at home - continue sling- f/u with ortho- went to see Dr Marcelino Scot on 3/23- was told to keep sling on for 6-8 wks - was supposed to see Dr Marcelino Scot last week but she stated she could not go down the stairs- grandson states he thinks she was scared she would fall again- will need to continue to address this issue - as she missed her appt, will contact Dr Marcelino Scot on Monday  Pyuria - no signs or symptoms to suggest active UTI   Code Status: full code Family Communication:  grandson Disposition Plan: to be determined DVT prophylaxis: Lovenox Consultants: Procedures:  Antibiotics: Anti-infectives    Start     Dose/Rate Route Frequency Ordered Stop   09/28/14 1500  vancomycin (VANCOCIN) 1,500 mg in sodium chloride 0.9 % 500 mL IVPB     1,500 mg 250 mL/hr over 120 Minutes Intravenous Every 48 hours 09/28/14 1420     09/28/14 1500  fluconazole (DIFLUCAN) tablet 100 mg     100 mg Oral Daily 09/28/14 1420     09/28/14 0415  piperacillin-tazobactam (ZOSYN) IVPB 3.375 g     3.375 g 100 mL/hr over 30 Minutes Intravenous  Once 09/28/14 0404 09/28/14 0727  Objective: Filed Weights   09/28/14 0900  Weight: 100.7 kg (222 lb 0.1 oz)    Intake/Output Summary (Last 24 hours) at 09/29/14 1107 Last data filed at 09/28/14 2051  Gross per 24 hour  Intake      0 ml  Output    925 ml  Net   -925 ml     Vitals Filed Vitals:   09/28/14 0900 09/28/14 1340 09/28/14 2050 09/29/14 0525  BP:  95/60 101/52 142/81  Pulse:   84 101  Temp:   98 F (36.7 C) 98.6 F (37 C)  TempSrc:   Oral Oral  Resp:   20 20  Weight: 100.7 kg (222 lb 0.1 oz)     SpO2:   92% 100%    Exam:  General:  Pt is alert, not in acute distress  HEENT: No icterus, No thrush  Cardiovascular: regular rate and rhythm, S1/S2 No  murmur  Respiratory: clear to auscultation bilaterally   Abdomen: Soft, +Bowel sounds, non tender, non distended, no guarding  MSK: No LE edema, cyanosis or clubbing  Skin- erythema, ulcers and tenderness in abdominal fold, peri-anal and buttock area  Data Reviewed: Basic Metabolic Panel:  Recent Labs Lab 09/28/14 0008 09/29/14 0430  NA 140 141  K 4.8 4.7  CL 111 116*  CO2 16* 17*  GLUCOSE 111* 82  BUN 100* 67*  CREATININE 1.86* 1.27*  CALCIUM 9.8 8.7   Liver Function Tests:  Recent Labs Lab 09/28/14 0008  AST 45*  ALT 73*  ALKPHOS 281*  BILITOT 0.8  PROT 8.3  ALBUMIN 3.4*   No results for input(s): LIPASE, AMYLASE in the last 168 hours. No results for input(s): AMMONIA in the last 168 hours. CBC:  Recent Labs Lab 09/28/14 0008 09/29/14 0430  WBC 9.6 6.5  HGB 11.5* 10.0*  HCT 35.3* 30.8*  MCV 96.2 95.7  PLT 326 273   Cardiac Enzymes: No results for input(s): CKTOTAL, CKMB, CKMBINDEX, TROPONINI in the last 168 hours. BNP (last 3 results) No results for input(s): BNP in the last 8760 hours.  ProBNP (last 3 results) No results for input(s): PROBNP in the last 8760 hours.  CBG:  Recent Labs Lab 09/28/14 1144 09/28/14 1615 09/28/14 2059 09/29/14 0800  GLUCAP 77 89 93 78    No results found for this or any previous visit (from the past 240 hour(s)).   Studies:  Recent x-ray studies have been reviewed in detail by the Attending Physician  Scheduled Meds:  Scheduled Meds: . allopurinol  100 mg Oral Daily  . amLODipine  10 mg Oral Daily  . aspirin EC  81 mg Oral Daily  . atorvastatin  20 mg Oral Daily  . enoxaparin (LOVENOX) injection  30 mg Subcutaneous Q24H  . fluconazole  100 mg Oral Daily  . fluticasone  1 spray Each Nare Daily  . folic acid  1 mg Oral Daily  . insulin aspart  0-5 Units Subcutaneous QHS  . insulin aspart  0-9 Units Subcutaneous TID WC  . pantoprazole  40 mg Oral Daily  . vancomycin  1,500 mg Intravenous Q48H    Continuous Infusions:   Time spent on care of this patient: 35 min   [place], MD 09/29/2014, 11:07 AM  LOS: 1 day   [facility] Office  [phone] Pager - Text Page per [email or web address]  If 7PM-7AM, please contact night-coverage [email or web address]

## 2014-09-29 NOTE — Progress Notes (Signed)
Physical Therapy Treatment Patient Details Name: Beverly Lewis MRN: [id number] DOB: 19-Feb-1929 Today's Date: 09/29/2014    History of Present Illness : Painful buttock sores, admitted 09/27/14, recent fall, L humerus fracture, in sling, decreased ability to transfer.    PT Comments    Patient requiring extensive assistance, declines SNF rehab. Grandson in room, requesting hospital bed with air overlay, WC and cushion. Patient will benefit from PT to address problems listed in note below.  Follow Up Recommendations  SNF;Home health PT;Supervision/Assistance - 24 hour (patient declines,SNF, grandson did not speak up excepte that patient needs Hospital bed. )     Equipment Recommendations  Wheelchair (measurements PT);Wheelchair cushion (measurements PT) (ramp)    Recommendations for Other Services       Precautions / Restrictions Precautions Precautions: Fall Precaution Comments: buttock sores, pain. L shoulder fracture, in sling Required Braces or Orthoses: Sling    Mobility  Bed Mobility Overal bed mobility: Needs Assistance;+2 for physical assistance;+ 2 for safety/equipment Bed Mobility: Supine to Sit;Sit to Supine     Supine to sit: Total assist;+2 for physical assistance;+2 for safety/equipment;HOB elevated Sit to supine: Total assist;+2 for physical assistance;+2 for safety/equipment   General bed mobility comments: purple slides utilized to facilitate sliding  around to sit at edge and back to bed, placed  under buttocks.`  Transfers Overall transfer level: Needs assistance               General transfer comment: unable to stand on 2 trials.  Ambulation/Gait                 Stairs            Wheelchair Mobility    Modified Rankin (Stroke Patients Only)       Balance                                    Cognition Arousal/Alertness: Awake/alert Behavior During Therapy: WFL for tasks assessed/performed Overall  Cognitive Status: Within Functional Limits for tasks assessed                      Exercises      General Comments        Pertinent Vitals/Pain Pain Assessment: Faces Faces Pain Scale: Hurts whole lot Pain Location: buttocks Pain Descriptors / Indicators: Burning;Tender Pain Intervention(s): Limited activity within patient's tolerance;Monitored during session (used slide sheets to slide easier.)    Home Living Family/patient expects to be discharged to:: Private residence Living Arrangements: Children Available Help at Discharge: Family Type of Home: House Home Access: Stairs to enter   Home Layout: One level Home Equipment: Bedside commode;Cane - quad;Walker - 2 wheels      Prior Function Level of Independence: Needs assistance  Gait / Transfers Assistance Needed: recently only able to transfer after fall, grandson assisting.       PT Goals (current goals can now be found in the care plan section) Acute Rehab PT Goals Patient Stated Goal: to go home PT Goal Formulation: With patient/family Time For Goal Achievement: 10/13/14 Potential to Achieve Goals: Fair    Frequency  Min 3X/week    PT Plan      Co-evaluation             End of Session Equipment Utilized During Treatment: Gait belt Activity Tolerance: Patient limited by pain Patient left: in bed;with call bell/phone within reach;with family/visitor  present     Time: 7416-3845 PT Time Calculation (min) (ACUTE ONLY): 35 min  Charges:  $Therapeutic Activity: 8-22 mins                    G Codes:      Claretha Cooper 09/29/2014, 5:59 PM Tresa Endo PT [phone]

## 2014-09-30 ENCOUNTER — Inpatient Hospital Stay (HOSPITAL_COMMUNITY): Payer: Medicare Other

## 2014-09-30 DIAGNOSIS — C3490 Malignant neoplasm of unspecified part of unspecified bronchus or lung: Secondary | ICD-10-CM

## 2014-09-30 LAB — GLUCOSE, CAPILLARY
Glucose-Capillary: 82 mg/dL (ref 70–99)
Glucose-Capillary: 99 mg/dL (ref 70–99)
Glucose-Capillary: 99 mg/dL (ref 70–99)
Glucose-Capillary: 93 mg/dL (ref 70–99)

## 2014-09-30 LAB — BASIC METABOLIC PANEL
ANION GAP: 9 (ref 5–15)
BUN: 39 mg/dL — ABNORMAL HIGH (ref 6–23)
CALCIUM: 9.2 mg/dL (ref 8.4–10.5)
CO2: 20 mmol/L (ref 19–32)
Chloride: 115 mmol/L — ABNORMAL HIGH (ref 96–112)
Creatinine, Ser: 1.05 mg/dL (ref 0.50–1.10)
GFR calc non Af Amer: 48 mL/min — ABNORMAL LOW (ref >90)
GFR, EST AFRICAN AMERICAN: 55 mL/min — AB (ref >90)
Glucose, Bld: 82 mg/dL (ref 70–99)
Potassium: 4.4 mmol/L (ref 3.5–5.1)
SODIUM: 144 mmol/L (ref 135–145)

## 2014-09-30 NOTE — Clinical Social Work Note (Signed)
CSW reviewed pt chart that reflected pt had "declined SNF".  CSW met with RN and case manager who confirmed that pt had "declined SNF"  No further CSW needs  If any other CSW needs arise please re consult  CSW signing off  .Dede Query, LCSW [facility] Clinical Social Worker - Weekend Coverage cell #: [phone]

## 2014-09-30 NOTE — Progress Notes (Signed)
CARE MANAGEMENT NOTE 09/30/2014  Patient:  Beverly Lewis, Beverly Lewis   Account Number:  [id number]  Date Initiated:  09/30/2014  Documentation initiated by:  [facility]  Subjective/Objective Assessment:   non-small cell lung cancer, fall, fx left humerus     Action/Plan:   Anticipated DC Date:  10/01/2014   Anticipated DC Plan:  [place]  In-house referral  Clinical Social Worker      DC Forensic scientist  CM consult      [facility] Choice  HOME HEALTH  Resumption Of Svcs/PTA Provider   Choice offered to / List presented to:  C-1 Patient   DME arranged  [place] [facility]      DME agency  [place].   Status of service:  Completed, signed off Medicare Important Message given?  YES (If response is "NO", the following Medicare IM given date fields will be blank) Date Medicare IM given:  09/30/2014 Medicare IM given by:  [facility] Date Additional Medicare IM given:   Additional Medicare IM given by:    Discharge Disposition:  [place]  Per UR Regulation:    If discussed at Long Length of Stay Meetings, dates discussed:    Comments:  09/30/2014 1600 NCM spoke to pt and offered choice for [facility]. Provided pt with HHA list. Pt state she is active with [facility]. States she thought aide was ordered for her last time but aide did not come out. NCM contacted [facility] with orders for roc for [facility], PT, aide and SW. Pt is refusing SNF. States her son, Gracy Racer # [phone] or grandson is with her at all times. She is never at home alone. Pt requesting wheelchair and hospital bed for home. Contacted [facility] DME rep for delivery of equipment prior to dc home on 4/11. Rep with arrange with son, Gracy Racer. NCM contacted son, [name]. Explained to son that [facility] aide was arranged along with other services she had with [facility], and they will contact him to arrange for delivery of DME. Son will check  into Medicaid for pt.  Jonnie Finner RN CCM Case Mgmt phone [phone]

## 2014-09-30 NOTE — Progress Notes (Signed)
[facility] Progress Note   Beverly Lewis:[id number] DOB: [birth year] DOA: 09/27/2014 PCP: [name], MD  Brief narrative: Beverly Lewis is a 81 y.o. female with past medical history of diabetes mellitus, hypertension, hyperlipidemia, tobacco abuse unresectable stage III non-small cell lung cancer on chemotherapy. She was supposed to resume chemotherapy but missed her appointment due to left humerus comminuted fracture on 3/22. Since the fracture she has not been as mobile as before and has been sitting in a "rest chair" at home. She presents to the hospital for painful ulcers on the perianal, lower abdominal and buttock area. I have spoken with her grandson personally. He has been helping her up to go to the bathroom every day when he is not there, his brother is there. They have not noticed that she has been urinating on herself and he states that it appears that this chair that she has sitting in has been dry. He is not sure if she is wiping herself properly in the bathroom as he does not assist with this. He states when she was seen in the ER, she was noted to have feces on her bottom.   Subjective: Has no complaints other than pain in her groin/ buttock when moving. Refusing to go to SNF again today and tells me she can go home. She is aware that she not only needs assistance in getting out of bed but also with wiping herself after urinating and BMs. Her grandson is unable to convince her as well. I have spoken with him as well again today.   Assessment/Plan: Principal Problem:   Cellulitis of groin, lower abdomen and buttocks with ulcers-   Fungal infection of the groin - cont IV Unasyn and Diflucan- will switch to orals tomorrow - she is currently incontinent of urine and stool although apparently she was not at home- foley as least for now - Wound care consult appreciated-  have spoken with grandson- understandably,  he is not comfortable doing dressings in this area  for his grandmother- patient refusing SNF- only option is to request a home health Aid and RN- PT recommends HHPT- she has 24 hr supervision at home  Active Problems:   Cancer of upper lobe of left lung/ stage 3 non-small cell cancer - missed appt as she refuses to go down the stairs at home since her fall on 3/21 - will need f/u with Dr Julien Nordmann as soon as able to - has an appt for 4/18    Dehydration   Renal failure (ARF), acute on chronic - lasix on hold- follow Cr which is improving    Fracture of left humerus due to fall at home - continue sling- f/u with ortho- went to see Dr Marcelino Scot on 3/23- was told to keep sling on for 6-8 wks - was supposed to see Dr Marcelino Scot last week but she stated she could not go down the stairs- grandson states he thinks she was scared she would fall again- will need to continue to address this issue - as she missed her appt, will contact Dr Marcelino Scot on Monday  Pyuria - E coli in urine culture - no signs or symptoms to suggest active UTI - is already on Unasyn   Code Status: full code Family Communication:  grandson Disposition Plan: to be determined DVT prophylaxis: Lovenox Consultants: Procedures:  Antibiotics: Anti-infectives    Start     Dose/Rate Route Frequency Ordered Stop   09/29/14 1800  vancomycin (VANCOCIN) 1,250 mg in sodium  chloride 0.9 % 250 mL IVPB  Status:  Discontinued     1,250 mg 166.7 mL/hr over 90 Minutes Intravenous Every 24 hours 09/29/14 1239 09/29/14 1518   09/29/14 1600  Ampicillin-Sulbactam (UNASYN) 3 g in sodium chloride 0.9 % 100 mL IVPB     3 g 100 mL/hr over 60 Minutes Intravenous Every 8 hours 09/29/14 1524     09/28/14 1500  vancomycin (VANCOCIN) 1,500 mg in sodium chloride 0.9 % 500 mL IVPB  Status:  Discontinued     1,500 mg 250 mL/hr over 120 Minutes Intravenous Every 48 hours 09/28/14 1420 09/29/14 1239   09/28/14 1500  fluconazole (DIFLUCAN) tablet 100 mg     100 mg Oral Daily 09/28/14 1420     09/28/14 0415   piperacillin-tazobactam (ZOSYN) IVPB 3.375 g     3.375 g 100 mL/hr over 30 Minutes Intravenous  Once 09/28/14 0404 09/28/14 0727      Objective: Filed Weights   09/28/14 0900  Weight: 100.7 kg (222 lb 0.1 oz)    Intake/Output Summary (Last 24 hours) at 09/30/14 1335 Last data filed at 09/30/14 0900  Gross per 24 hour  Intake    480 ml  Output   2001 ml  Net  -1521 ml     Vitals Filed Vitals:   09/29/14 1335 09/29/14 1900 09/29/14 2128 09/30/14 0448  BP: 136/79  142/66 133/65  Pulse: 88  91 99  Temp: 98.3 F (36.8 C)  97.6 F (36.4 C) 98.2 F (36.8 C)  TempSrc: Oral  Oral Oral  Resp: 18  16 16   Height:  5\' 2"  (1.575 m)    Weight:      SpO2: 99%  99% 100%    Exam:  General:  Pt is alert, not in acute distress  HEENT: No icterus, No thrush  Cardiovascular: regular rate and rhythm, S1/S2 No murmur  Respiratory: clear to auscultation bilaterally   Abdomen: Soft, +Bowel sounds, non tender, non distended, no guarding  MSK: No LE edema, cyanosis or clubbing  Skin- erythema, ulcers and tenderness in abdominal fold, peri-anal and buttock area  Data Reviewed: Basic Metabolic Panel:  Recent Labs Lab 09/28/14 0008 09/29/14 0430 09/30/14 0435  NA 140 141 144  K 4.8 4.7 4.4  CL 111 116* 115*  CO2 16* 17* 20  GLUCOSE 111* 82 82  BUN 100* 67* 39*  CREATININE 1.86* 1.27* 1.05  CALCIUM 9.8 8.7 9.2   Liver Function Tests:  Recent Labs Lab 09/28/14 0008  AST 45*  ALT 73*  ALKPHOS 281*  BILITOT 0.8  PROT 8.3  ALBUMIN 3.4*   No results for input(s): LIPASE, AMYLASE in the last 168 hours. No results for input(s): AMMONIA in the last 168 hours. CBC:  Recent Labs Lab 09/28/14 0008 09/29/14 0430  WBC 9.6 6.5  HGB 11.5* 10.0*  HCT 35.3* 30.8*  MCV 96.2 95.7  PLT 326 273   Cardiac Enzymes: No results for input(s): CKTOTAL, CKMB, CKMBINDEX, TROPONINI in the last 168 hours. BNP (last 3 results) No results for input(s): BNP in the last 8760  hours.  ProBNP (last 3 results) No results for input(s): PROBNP in the last 8760 hours.  CBG:  Recent Labs Lab 09/29/14 1140 09/29/14 1706 09/29/14 2108 09/30/14 0745 09/30/14 1129  GLUCAP 71 94 89 82 99    Recent Results (from the past 240 hour(s))  Urine culture     Status: None (Preliminary result)   Collection Time: 09/28/14  9:00 AM  Result Value  Ref Range Status   Specimen Description URINE, RANDOM  Final   Special Requests NONE  Final   Colony Count   Final    >=100,000 COLONIES/ML Performed at [facility]    Culture   Final    ESCHERICHIA COLI Performed at [facility]    Report Status PENDING  Incomplete     Studies:  Recent x-ray studies have been reviewed in detail by the Attending Physician  Scheduled Meds:  Scheduled Meds: . allopurinol  100 mg Oral Daily  . amLODipine  10 mg Oral Daily  . ampicillin-sulbactam (UNASYN) IV  3 g Intravenous Q8H  . aspirin EC  81 mg Oral Daily  . atorvastatin  20 mg Oral Daily  . enoxaparin (LOVENOX) injection  50 mg Subcutaneous Q24H  . fluconazole  100 mg Oral Daily  . fluticasone  1 spray Each Nare Daily  . folic acid  1 mg Oral Daily  . insulin aspart  0-5 Units Subcutaneous QHS  . insulin aspart  0-9 Units Subcutaneous TID WC  . pantoprazole  40 mg Oral Daily   Continuous Infusions:   Time spent on care of this patient: 35 min   [place], MD 09/30/2014, 1:35 PM  LOS: 2 days   [facility] Office  [phone] Pager - Text Page per [email or web address]  If 7PM-7AM, please contact night-coverage [email or web address]

## 2014-10-01 ENCOUNTER — Telehealth: Payer: Self-pay | Admitting: Medical Oncology

## 2014-10-01 DIAGNOSIS — L89153 Pressure ulcer of sacral region, stage 3: Secondary | ICD-10-CM

## 2014-10-01 LAB — GLUCOSE, CAPILLARY
GLUCOSE-CAPILLARY: 118 mg/dL — AB (ref 70–99)
Glucose-Capillary: 80 mg/dL (ref 70–99)
Glucose-Capillary: 79 mg/dL (ref 70–99)
Glucose-Capillary: 112 mg/dL — ABNORMAL HIGH (ref 70–99)

## 2014-10-01 LAB — CBC
HCT: 34.2 % — ABNORMAL LOW (ref 36.0–46.0)
Hemoglobin: 10.7 g/dL — ABNORMAL LOW (ref 12.0–15.0)
MCH: 30.4 pg (ref 26.0–34.0)
MCHC: 31.3 g/dL (ref 30.0–36.0)
MCV: 97.2 fL (ref 78.0–100.0)
Platelets: 241 10*3/uL (ref 150–400)
RBC: 3.52 MIL/uL — ABNORMAL LOW (ref 3.87–5.11)
RDW: 16 % — AB (ref 11.5–15.5)
WBC: 5.2 10*3/uL (ref 4.0–10.5)

## 2014-10-01 LAB — BASIC METABOLIC PANEL
ANION GAP: 7 (ref 5–15)
BUN: 30 mg/dL — ABNORMAL HIGH (ref 6–23)
CALCIUM: 9.1 mg/dL (ref 8.4–10.5)
CHLORIDE: 114 mmol/L — AB (ref 96–112)
CO2: 21 mmol/L (ref 19–32)
Creatinine, Ser: 1.04 mg/dL (ref 0.50–1.10)
GFR calc Af Amer: 56 mL/min — ABNORMAL LOW (ref >90)
GFR calc non Af Amer: 48 mL/min — ABNORMAL LOW (ref >90)
GLUCOSE: 80 mg/dL (ref 70–99)
POTASSIUM: 4.5 mmol/L (ref 3.5–5.1)
Sodium: 142 mmol/L (ref 135–145)

## 2014-10-01 LAB — URINE CULTURE: Colony Count: >=100000

## 2014-10-01 MED ORDER — NYSTATIN 100000 UNIT/GM EX POWD: Freq: Four times a day (QID) | CUTANEOUS | Status: DC

## 2014-10-01 MED ORDER — AMOXICILLIN-POT CLAVULANATE 875-125 MG PO TABS: 1.0000 | ORAL_TABLET | Freq: Two times a day (BID) | ORAL | Status: AC

## 2014-10-01 MED ORDER — HYDROCODONE-ACETAMINOPHEN 5-325 MG PO TABS: 1.0000 | ORAL_TABLET | Freq: Four times a day (QID) | ORAL | Status: DC | PRN

## 2014-10-01 NOTE — Progress Notes (Signed)
Patient with several episodes of hemoptysis last night. Did so about 6-7 times.

## 2014-10-01 NOTE — Progress Notes (Signed)
Physical Therapy Treatment Patient Details Name: Beverly Lewis MRN: [id number] DOB: 10-22-1928 Today's Date: 10/01/2014    History of Present Illness : Painful buttock sores, admitted 09/27/14, recent fall, L humerus fracture, in sling, decreased ability to transfer.    PT Comments    Granddaughter present during session and assisted with getting pt OOB to recliner.  Pt required + 2 assist for safety and increased time.  Greatest difficulty was self scooting to EOB and demonstrated limited stance time due to weakness.  Pt was able to briefly rise from bed and take a few pivoted steps to recliner.    Follow Up Recommendations  SNF;Home health PT;Supervision/Assistance - 24 hour (pending pt/family final decision)     Equipment Recommendations       Recommendations for Other Services       Precautions / Restrictions Precautions Precautions: Fall Precaution Comments: buttock sores, pain. L shoulder fracture, in sling Required Braces or Orthoses: Sling Restrictions Weight Bearing Restrictions: Yes LUE Weight Bearing: Non weight bearing    Mobility  Bed Mobility Overal bed mobility: Needs Assistance;+2 for physical assistance;+ 2 for safety/equipment Bed Mobility: Rolling;Supine to Sit Rolling: Total assist (L UE in sling)   Supine to sit: Total assist;+2 for physical assistance;+2 for safety/equipment;HOB elevated     General bed mobility comments: used bed pad to scoot hips such that pt could sit EOB.  Once upright, pt was able to sit EOB at Supervision level x 5 min  Transfers Overall transfer level: Needs assistance Equipment used: None             General transfer comment: with Granddaughter Olivia Mackie, assisted pt to standing + 2 hand held assist as pt is unable to use walker due to L UE in sling (NWB).  Pt was able to take a few pivoted steps 1/4 turn to recliner.    Ambulation/Gait             General Gait Details: transfers only attempted   Stairs            Wheelchair Mobility    Modified Rankin (Stroke Patients Only)       Balance                                    Cognition Arousal/Alertness: Awake/alert Behavior During Therapy: WFL for tasks assessed/performed Overall Cognitive Status: Within Functional Limits for tasks assessed                      Exercises      General Comments        Pertinent Vitals/Pain Pain Assessment: Faces Faces Pain Scale: Hurts whole lot Pain Location: buttocks Pain Descriptors / Indicators: Sore Pain Intervention(s): Monitored during session;Repositioned    Home Living                      Prior Function            PT Goals (current goals can now be found in the care plan section) Progress towards PT goals: Progressing toward goals    Frequency  Min 3X/week    PT Plan      Co-evaluation             End of Session Equipment Utilized During Treatment: Gait belt Activity Tolerance: Patient limited by fatigue Patient left: in chair;with call bell/phone within reach;with family/visitor present  Time: 7282-0601 PT Time Calculation (min) (ACUTE ONLY): 29 min  Charges:  $Therapeutic Activity: 23-37 mins                    G Codes:      Rica Koyanagi  PTA [facility]  Acute  Rehab Pager      [phone]

## 2014-10-01 NOTE — Telephone Encounter (Signed)
Pt going to SNF today per [employer]. I called and notified Tisa at [facility] that there is not a need for DME at this time.

## 2014-10-01 NOTE — Discharge Summary (Addendum)
Physician Discharge Summary  NITI LEISURE WEX:[id number] DOB: September 23, 1928 DOA: 09/27/2014  PCP: [name], MD  Admit date: 09/27/2014 Discharge date: 10/01/2014  Time spent: 65 minutes  Recommendations for Outpatient Follow-up:  1. Would DC Foley in 2-3 days  2. Recommend wound care consult once she arrives at skilled nursing facility Current wound care orders: Add air mattress for moisture management -- Add Interdry Ag+ under pannus for IAD-Silicone foam to protect pressure ulcer and other skin breakdown unless they are staying saturated with urine, then to use zinc based barrier cream instead. 3. PT orders per Dr. Marcelino Scot: Active motion at left elbow and pendulum of left shoulder 4. Needs appointment with Dr. Marcelino Scot for 2 weeks from today  5 has an appointment with Dr. Julien Nordmann for 4/18- will need transportation  Discharge Condition: Stable Diet recommendation: Low sodium heart healthy diabetic diet  Discharge Diagnoses:  Principal Problem:   Cellulitis of groin Active Problems:   Non-small cell carcinoma of lung, stage 3 Moisture associated skin damage with ulcers- gluteal cleft, inner thighs/buttocks in in pannus Stage 3 pressure ulcer left ischium    Dehydration   Renal failure (ARF), acute on chronic   Fracture of left humerus   Fungal infection of the groin   Fall at home   Failure to thrive in adult   History of present illness:  Beverly Lewis is a 81 y.o. female with past medical history of diabetes mellitus, hypertension, hyperlipidemia, tobacco abuse unresectable stage III non-small cell lung cancer on chemotherapy. She was supposed to resume chemotherapy but missed her appointment due to left humerus comminuted fracture on 3/22. Since the fracture she has not been as mobile as before and has been sitting in a "rest chair" at home. She presents to the hospital for painful ulcers on the perianal, lower abdominal and buttock area. I have spoken with her grandson  personally. He has been helping her up to go to the bathroom every day when he is not there, his brother is there. They have not noticed that she has been urinating on herself and he states that it appears that this chair that she has sitting in has been dry. He is not sure if she is wiping herself properly in the bathroom as he does not assist with this. He states when she was seen in the ER, she was noted to have feces on her bottom.   Hospital Course:  Principal Problem:  Cellulitis of groin, lower abdomen and buttocks with ulcers- Fungal infection of the groin - suspect this was all due to incontinence, poor hygiene at home after recent humerus fracture. - she has been on IV Unasyn and Diflucan for 4 days- will switch to orals Augmentin for 28more days- d/c Diflucan today- continue nystatin powder - she is currently incontinent of urine - foley as least for now until she is able to ambulate more or at least use the bedpan appropriately - We will need to watch closely for incontinence of stool as well - Wound care consult appreciated-wound care orders listed above  - have spoken with grandson a few times- understandably, he is not comfortable doing dressings in this area for his grandmother- patient was refusing SNF but has actually agreed to it today and will be going to skilled nursing until wounds are healed and she is able to ambulate appropriately   Active Problems:  Cancer of upper lobe of left lung/ stage 3 non-small cell cancer - missed appt as she refuses  to go down the stairs at home since her fall on 3/21 - will need f/u with Dr Julien Nordmann as soon as able to - has an appt for 4/18   Dehydration  Renal failure (ARF), acute on chronic - lasix on hold- follow Cr which is improving   Fracture of left humerus due to fall at home - continue sling- f/u with ortho- went to see Dr Marcelino Scot on 3/23- was told to keep sling on for 6-8 wks - was supposed to see Dr Marcelino Scot last week but she  stated she could not go down the stairs- grandson states he thinks she was scared she would fall again- will need to continue to address this issue - as she missed her appt, have contacted Dr Marcelino Scot - he recommend she follow up with him in 2 weeks.-He would like an AP lateral of the left shoulder now so we can review it later-he recommends PT in the form of active motion at the elbow and pendulum of the shoulder.  Pyuria - E coli in urine culture sensitive to penicillins - no signs or symptoms to suggest active UTI but is already on Unasyn  Procedures:  none  Consultations:  Phone conversation with Dr Marcelino Scot  Discharge Exam: Danley Danker Weights   09/28/14 0900  Weight: 100.7 kg (222 lb 0.1 oz)   Filed Vitals:   10/01/14 0956  BP: 126/68  Pulse:   Temp:   Resp:     General: AAO x 3, no distress- morbidly obese Cardiovascular: RRR, no murmurs  Respiratory: clear to auscultation bilaterally GI: soft, non-tender, non-distended, bowel sound positive MSK: left arm in sling, no pedal edema Skin: Per wound care note- several full thickness areas on the inner gluteal cleft 0.5cm x 0.5cm x 0.2cm, larger area left ischium 4cm x 1.5cm x 0.2cm, posterior thighs left 3.5cm x 4.0cm x 0.1cm, unable to visualize right posterior thigh well due to pain with movement.  Wound KDT:OIZT ischium: 90% yellow, posterior thighs pink, moist, areas in the gluteal cleft area pink, moist, partial thickness  Intense redness under the pannus and tender to my touch during assessment, large amounts of antifungal ointments and powders currently in place.   Discharge Instructions You were cared for by a hospitalist during your hospital stay. If you have any questions about your discharge medications or the care you received while you were in the hospital after you are discharged, you can call the unit and asked to speak with the hospitalist on call if the hospitalist that took care of you is not available. Once you are  discharged, your primary care physician will handle any further medical issues. Please note that NO REFILLS for any discharge medications will be authorized once you are discharged, as it is imperative that you return to your primary care physician (or establish a relationship with a primary care physician if you do not have one) for your aftercare needs so that they can reassess your need for medications and monitor your lab values.      Discharge Instructions    Discharge instructions    Complete by:  As directed   Sodium heart healthy diabetic diet PT orders for left arm from Dr. Marcelino Scot: Active motion at elbow and pendulum of shoulder     Increase activity slowly    Complete by:  As directed             Medication List    STOP taking these medications  furosemide 20 MG tablet  Commonly known as:  LASIX      TAKE these medications        acetaminophen 500 MG tablet  Commonly known as:  TYLENOL  Take 500 mg by mouth every 6 (six) hours as needed for moderate pain (inflammation).     allopurinol 100 MG tablet  Commonly known as:  ZYLOPRIM  Take 100 mg by mouth daily.     amLODipine 10 MG tablet  Commonly known as:  NORVASC  Take 10 mg by mouth daily.     aspirin EC 81 MG tablet  Take 81 mg by mouth daily.     atorvastatin 20 MG tablet  Commonly known as:  LIPITOR  Take 20 mg by mouth daily.     COLCRYS 0.6 MG tablet  Generic drug:  colchicine  Take 0.6 mg by mouth 2 (two) times daily as needed (gout). For gout     eprosartan-hydrochlorothiazide 600-12.5 MG per tablet  Commonly known as:  TEVETEN HCT  Take 1 tablet by mouth daily.     fluticasone 50 MCG/ACT nasal spray  Commonly known as:  FLONASE  Place 1 spray into both nostrils daily.     folic acid 1 MG tablet  Commonly known as:  FOLVITE  Take 1 mg by mouth daily.     HYDROcodone-acetaminophen 5-325 MG per tablet  Commonly known as:  NORCO/VICODIN  Take 1 tablet by mouth every 6 (six) hours as  needed for moderate pain.     JANUVIA 100 MG tablet  Generic drug:  sitaGLIPtin  Take 100 mg by mouth daily.     NEXIUM 40 MG capsule  Generic drug:  esomeprazole  Take 40 mg by mouth daily before breakfast.     Vitamin D2 2000 UNITS Tabs  Take 1 tablet by mouth daily.       Allergies  Allergen Reactions  . Asa Buff (Mag [Buffered Aspirin]     In tolerance in higher dosages.   Follow-up Information    Follow up with [facility].   Why:  Home Health RN, Physical Therapy, aide, and Social Worker   Contact information:   [address] [place] [address] [phone]        The results of significant diagnostics from this hospitalization (including imaging, microbiology, ancillary and laboratory) are listed below for reference.    Significant Diagnostic Studies: Ct Head Wo Contrast  09/10/2014   CLINICAL DATA:  [place] and hit head.  EXAM: CT HEAD WITHOUT CONTRAST  CT CERVICAL SPINE WITHOUT CONTRAST  TECHNIQUE: Multidetector CT imaging of the head and cervical spine was performed following the standard protocol without intravenous contrast. Multiplanar CT image reconstructions of the cervical spine were also generated.  COMPARISON:  None.  FINDINGS: CT HEAD FINDINGS  There is a large right final scalp hematoma. No underlying skull fracture. Hyperostosis frontalis interna is noted. There is age related cerebral atrophy, ventriculomegaly and fairly extensive periventricular white matter disease. No extra-axial fluid collection or subdural hematoma. No findings for hemispheric infarction. No mass lesion. The brainstem and cerebellum are grossly normal. The paranasal sinuses and mastoid air cells are grossly clear.  CT CERVICAL SPINE FINDINGS  Severe degenerative cervical spondylosis with multilevel disc disease and facet disease. No definite acute fracture. The skullbase C1 and C1-2 articulations are maintained. The dens is intact. Bilateral carotid artery  calcifications are noted. The visualized lung apices are clear.  IMPRESSION: No acute intracranial findings or skull fracture.  Large right frontal scalp hematoma.  Severe degenerative cervical spondylosis but no definite acute fracture.   Electronically Signed   By: Marijo Sanes M.D.   On: 09/10/2014 21:26   Ct Chest Wo Contrast  09/03/2014   CLINICAL DATA:  Restaging left lung cancer diagnosed 2013. Chemotherapy ongoing. Subsequent treatment strategy.  EXAM: CT CHEST WITHOUT CONTRAST  TECHNIQUE: Multidetector CT imaging of the chest was performed following the standard protocol without IV contrast.  COMPARISON:  CT 05/25/2014, PET-CT 12/08/2013  FINDINGS: Mediastinum/Nodes: No axillary or supraclavicular lymphadenopathy. Right lower paratracheal lymph node measures 12 mm increased from 8 mm on comparison.  Small high right paratracheal lymph node measures 7 mm compared to 5 mm (image 8, series 2). No pericardial fluid. Esophagus is normal.  The hilum is difficult to assess on this noncontrast exam.  Lungs/Pleura: Multi lobular right upper lobe consolidative mass measures 71 x 31 mm compared to 67 x 27 mm for an apparent increase in volume. This lesion measures 33 mm in maximal craniocaudal dimension compared to 29 mm. There is volume loss in the right upper lobe . Peripheral to the Upper pole to the lobular mass there is interlobular septal thickening increased from prior.  Upper abdomen: Bilateral adrenal adenomas are again demonstrated. Low-density lesion the left hepatic lobe consistent with benign cysts not changed.  Musculoskeletal: No aggressive osseous lesion. Multiple levels of endplate spurring and joint space narrowing in the thoracic spine.  IMPRESSION: 1. Interval increased in volume of right upper lobe lobular mass is concerning for recurrence / progression of lung carcinoma. 2. Increase in peripheral interlobular septal thickening suggests postobstructive pneumonitis. Alternatively this could  relate to radiation change. 3. Mild increase in size of small paratracheal lymph nodes on the right is also concerning for recurrence. These results will be called to the ordering clinician or representative by the Radiologist Assistant, and communication documented in the PACS or zVision Dashboard.   Electronically Signed   By: Suzy Bouchard M.D.   On: 09/03/2014 09:47   Ct Cervical Spine Wo Contrast  09/10/2014   CLINICAL DATA:  [place] and hit head.  EXAM: CT HEAD WITHOUT CONTRAST  CT CERVICAL SPINE WITHOUT CONTRAST  TECHNIQUE: Multidetector CT imaging of the head and cervical spine was performed following the standard protocol without intravenous contrast. Multiplanar CT image reconstructions of the cervical spine were also generated.  COMPARISON:  None.  FINDINGS: CT HEAD FINDINGS  There is a large right final scalp hematoma. No underlying skull fracture. Hyperostosis frontalis interna is noted. There is age related cerebral atrophy, ventriculomegaly and fairly extensive periventricular white matter disease. No extra-axial fluid collection or subdural hematoma. No findings for hemispheric infarction. No mass lesion. The brainstem and cerebellum are grossly normal. The paranasal sinuses and mastoid air cells are grossly clear.  CT CERVICAL SPINE FINDINGS  Severe degenerative cervical spondylosis with multilevel disc disease and facet disease. No definite acute fracture. The skullbase C1 and C1-2 articulations are maintained. The dens is intact. Bilateral carotid artery calcifications are noted. The visualized lung apices are clear.  IMPRESSION: No acute intracranial findings or skull fracture. Large right frontal scalp hematoma.  Severe degenerative cervical spondylosis but no definite acute fracture.   Electronically Signed   By: Marijo Sanes M.D.   On: 09/10/2014 21:26   Dg Knee Complete 4 Views Left  09/30/2014   CLINICAL DATA:  81 year old female with fall today and left knee injury and pain. Initial  encounter.  EXAM: LEFT KNEE - COMPLETE  4+ VIEW  COMPARISON:  None. This exam was interpreted during a PACS downtime with limited availability of comparison cases.  FINDINGS: No acute fracture, no acute fracture, subluxation or dislocation identified.  Tricompartmental degenerative changes are noted, severe in the lateral compartment.  There may be a small knee effusion present.  No focal bony lesions are identified.  IMPRESSION: Question small knee effusion.  No acute bony abnormality.  Tricompartmental degenerative changes, severe in the lateral compartment.   Electronically Signed   By: Margarette Canada M.D.   On: 09/30/2014 13:23   Dg Humerus Left  09/10/2014   CLINICAL DATA:  [place] at home today.  Injured left arm.  EXAM: LEFT HUMERUS - 2+ VIEW  COMPARISON:  None.  FINDINGS: There is a complex comminuted fracture involving the proximal humeral shaft. The humeral head and neck are intact. There are a severe degenerative changes involving the glenohumeral joint. The elbow is grossly intact.  IMPRESSION: Complex comminuted proximal humeral shaft fracture.  Advanced glenohumeral joint degenerative changes.   Electronically Signed   By: Marijo Sanes M.D.   On: 09/10/2014 20:15    Microbiology: Recent Results (from the past 240 hour(s))  Urine culture     Status: None   Collection Time: 09/28/14  9:00 AM  Result Value Ref Range Status   Specimen Description URINE, RANDOM  Final   Special Requests NONE  Final   Colony Count   Final    >=100,000 COLONIES/ML Performed at [facility]    Culture   Final    ESCHERICHIA COLI Performed at [facility]    Report Status 10/01/2014 FINAL  Final   Organism ID, Bacteria ESCHERICHIA COLI  Final      Susceptibility   Escherichia coli - MIC*    AMPICILLIN >=32 RESISTANT Resistant     CEFAZOLIN <=4 SENSITIVE Sensitive     CEFTRIAXONE <=1 SENSITIVE Sensitive     CIPROFLOXACIN >=4 RESISTANT Resistant     GENTAMICIN <=1 SENSITIVE Sensitive      LEVOFLOXACIN >=8 RESISTANT Resistant     NITROFURANTOIN <=16 SENSITIVE Sensitive     TOBRAMYCIN <=1 SENSITIVE Sensitive     TRIMETH/SULFA <=20 SENSITIVE Sensitive     PIP/TAZO <=4 SENSITIVE Sensitive     * ESCHERICHIA COLI     Labs: Basic Metabolic Panel:  Recent Labs Lab 09/28/14 0008 09/29/14 0430 09/30/14 0435 10/01/14 0522  NA 140 141 144 142  K 4.8 4.7 4.4 4.5  CL 111 116* 115* 114*  CO2 16* 17* 20 21  GLUCOSE 111* 82 82 80  BUN 100* 67* 39* 30*  CREATININE 1.86* 1.27* 1.05 1.04  CALCIUM 9.8 8.7 9.2 9.1   Liver Function Tests:  Recent Labs Lab 09/28/14 0008  AST 45*  ALT 73*  ALKPHOS 281*  BILITOT 0.8  PROT 8.3  ALBUMIN 3.4*   No results for input(s): LIPASE, AMYLASE in the last 168 hours. No results for input(s): AMMONIA in the last 168 hours. CBC:  Recent Labs Lab 09/28/14 0008 09/29/14 0430 10/01/14 0522  WBC 9.6 6.5 5.2  HGB 11.5* 10.0* 10.7*  HCT 35.3* 30.8* 34.2*  MCV 96.2 95.7 97.2  PLT 326 273 241   Cardiac Enzymes: No results for input(s): CKTOTAL, CKMB, CKMBINDEX, TROPONINI in the last 168 hours. BNP: BNP (last 3 results) No results for input(s): BNP in the last 8760 hours.  ProBNP (last 3 results) No results for input(s): PROBNP in the last 8760 hours.  CBG:  Recent Labs Lab  09/30/14 1129 09/30/14 1636 09/30/14 2058 10/01/14 0748 10/01/14 1145  GLUCAP 99 99 93 80 79       Signed:  [place], MD [facility] 10/01/2014, 12:21 PM

## 2014-10-01 NOTE — Progress Notes (Addendum)
Clinical Social Work Department CLINICAL SOCIAL WORK PLACEMENT NOTE 10/01/2014  Patient:  Beverly Lewis, Beverly Lewis  Account Number:  [id number] Admit date:  09/27/2014  Clinical Social Worker:  Maryln Manuel  Date/time:  10/01/2014 09:30 AM  Clinical Social Work is seeking post-discharge placement for this patient at the following level of care:   SKILLED NURSING   (*CSW will update this form in Epic as items are completed)   10/01/2014  Patient/family provided with [facility] Department of Clinical Social Work's list of facilities offering this level of care within the geographic area requested by the patient (or if unable, by the patient's family).  10/01/2014  Patient/family informed of their freedom to choose among providers that offer the needed level of care, that participate in Medicare, Medicaid or managed care program needed by the patient, have an available bed and are willing to accept the patient.  10/01/2014  Patient/family informed of [facility]' ownership interest in [facility], as well as of the fact that they are under no obligation to receive care at this facility.  PASARR submitted to EDS on 10/01/2014 PASARR number received on 10/01/2014  FL2 transmitted to all facilities in geographic area requested by pt/family on  10/01/2014 FL2 transmitted to all facilities within larger geographic area on   Patient informed that his/her managed care company has contracts with or will negotiate with  certain facilities, including the following:     Patient/family informed of bed offers received:  10/01/2014 Patient chooses bed at [facility] Physician recommends and patient chooses bed at    Patient to be transferred to  on  [facility] on 10/01/2014 Patient to be transferred to facility by ambulance Corey Harold) Patient and family notified of transfer on 10/01/2014 Name of family member notified:  Pt notified at bedside and pt grandson,  [place] notified via telephone  The following physician request were entered in Epic:   Additional Comments:   Alison Murray, MSW, [place] Work [phone]

## 2014-10-01 NOTE — Progress Notes (Signed)
Pt for discharge to [facility].  CSW facilitated pt discharge needs including contacting facility, faxing pt discharge information via [facility], discussing with pt at bedside, pt granddaughter present at bedside, contact pt grandson, [name] via telephone and left message, providing RN phone number to call report, and arranging ambulance transportation via [place] for pt to [facility].  Pt agreeable to plan for [facility] and grateful that pt family were able to assist her in the decision as she was unsure about her first choice for placement. Pt eager to participate with therapy in order to return home after a short term rehab stay.   No further social work needs identified at this time.  CSW signing off.   Alison Murray, MSW, [place] Work [phone]

## 2014-10-01 NOTE — Progress Notes (Addendum)
CSW continuing to follow.  CSW followed up with pt at bedside.   CSW provided pt with SNF bed offers. CSW updated pt that CSW was able to determine from pt friend that pt friend had been to [facility] in the past. CSW discussed with pt the bed offers and contacted [facility] and [facility] at pt request to determine private room availability. Both facilities are able to offer a private room.   Pt discussed that she is agreeable to [facility] as they have private room availability.   CSW contacted [facility] [place] and confirmed that they could accept pt today.   CSW contacted pt grandson, [name] to update on pt choice and pt grandson, [name] states that he was able to break down pt bed at the home to make room for a hospital bed and that pt can return home with home health services. CSW discussed with pt grandson that recommendation is rehab at [facility] and pt grandson stated that pt was only agreeable to SNF due to pt not having a hospital bed at home. Pt grandson stated that he has not spoken to pt about returning home.   CSW spoke with pt and discussed with pt that pt grandson stated that he had broken down bed at the home to have a hospital bed delivered. Pt stated that she does want a hospital bed delivered once she completes rehab at SNF, but stated that the doctor stated that she needed to go to rehab at [facility] before returning home. CSW discussed with pt that [facility] can set up equipment and home health once she completes short term rehab stay. Pt states that she wishes to proceed with plan to go to [facility] for short term rehab before returning home.   CSW updated MD.   CSW to continue to follow to provide support and assist with pt disposition needs.   Addendum 1:47 pm:  CSW received notification from RN that pt family concerned about the facility that pt chose for SNF.    CSW received phone call from pt grandson, [name] stating that he wanted to know the other facilities that were able to offer a bed and inquired about if pt appropriate for [facility].   CSW discussed that pt was not appropriate for [facility] as this was not a recommendation from PT. CSW e-mailed pt grandson the bed offers that pt received.   CSW spoke with pt who confirmed that she was questioning her decision and hopeful that pt grandson can assist with confirming decision.  CSW received return phone call from pt grandson stating that he spoke with pt and they are agreeable to [facility].   CSW met with pt at bedside and pt stated that she is agreeable to [facility] for short term rehab.  CSW contacted [facility] and confirmed private room availability for today.   CSW to facilitate pt discharge needs.  Alison Murray, MSW, [place] Work [phone]

## 2014-10-01 NOTE — Progress Notes (Signed)
OT Cancellation Note  Patient Details Name: Beverly Lewis MRN: [id number] DOB: [birth year]   Cancelled Treatment:    Reason Eval/Treat Not Completed: Other (comment) Attempted OT eval but pt states she is tired from PT session and requests OT check back another time.  [place], [place] 10/01/2014, 11:39 AM

## 2014-10-01 NOTE — Clinical Documentation Improvement (Signed)
PLEASE NOTE IF PRESENT ON ADMISSION  Presents with cellulitis and fungal infection in groin area. Ulcers documented in progress notes; Stage 3 pressure ulcer of left ischium documented by [facility] RN in her 09/28/14 consult.  If you agree with her assessment, please document stage and site of pressure ulcer in next progress note and include in discharge summary if applicable.  Stage  I  Pressure Ulcer   (reddening of the skin) Stage  II Pressure Ulcer  (blister open or unopened) Stage  III Pressure Ulcer (through all layers skin) Stage IV Pressure Ulcer   (through skin & underlying  muscle, tendons, and bones) Other Condition Cannot Clinically Determine   Thank You, Zoila Shutter ,RN Clinical Documentation Specialist:  [phone]  [facility]

## 2014-10-01 NOTE — Telephone Encounter (Signed)
discharged to [facility].

## 2014-10-01 NOTE — Progress Notes (Signed)
Clinical Social Work Department BRIEF PSYCHOSOCIAL ASSESSMENT 10/01/2014  Patient:  Beverly Lewis, Beverly Lewis     Account Number:  [id number]     Admit date:  09/27/2014  Clinical Social Worker:  Maryln Manuel  Date/Time:  10/01/2014 09:30 AM  Referred by:  Physician  Date Referred:  10/01/2014 Referred for  SNF Placement   Other Referral:   Interview type:  Patient Other interview type:   and patient grandson, Beverly via telephone    PSYCHOSOCIAL DATA Living Status:  FAMILY Admitted from facility:   Level of care:   Primary support name:  Beverly Lewis/grandson/[phone] Primary support relationship to patient:  FAMILY Degree of support available:   adequate    CURRENT CONCERNS Current Concerns  Post-Acute Placement   Other Concerns:    SOCIAL WORK ASSESSMENT / PLAN CSW received notification from MD that pt was declining SNF all weekend, but MD spoke with pt at length this AM and pt is now agreeable to SNF and medically ready for discharge today.    CSW met with pt at bedside. CSW introduced self and explained role. Pt reports that she was hopeful to return home, but now agreeable to short term rehab at SNF in order to get stronger and have assistance with her wound care. CSW discussed with pt that per MD, pt medically ready today therefore decisions will have to made in a timely manner. Pt expressed understanding. CSW explored with pt if she is familiar with any facilities and pt reports that she is not familiar with facilities, but she has a friend that she been to a facility twice and her friend liked that facility, but pt cannot remember the name of facility. CSW assisted pt in contacting pt friend via telephone, but unable to reach at this time. Pt provided permission for CSW to continue attempting to reach pt friend. Pt is agreeable to [facility] search. Pt provided permission for CSW to contact pt grandson, [name] to discuss.    CSW contacted pt grandson, Beverly via  telephone. CSW updated pt grandson that pt is now agreeable to SNF for rehab. Pt grandson discussed that he is relieved that pt agreeable to go to a facility upon discharge. Pt grandson reports not being familiar with facilities, but will assist as much as he can in helping pt make a decision.    CSW was able to reach pt friend via telephone and pt friend provided CSW with information that pt friend went to rehab at [facility]. CSW will update pt with this information when CSW provides bed offers.    CSW completed FL2 and initiatied SNF search. CSW to follow up with pt and pt grandson with bed offers in order to get decision and facilitate pt discharge needs this afternoon.    CSW to continue to follow to provide support and assist with pt disposition needs.   Assessment/plan status:  Psychosocial Support/Ongoing Assessment of Needs Other assessment/ plan:   discharge planning   Information/referral to community resources:   [facility] list    PATIENT'S/FAMILY'S RESPONSE TO PLAN OF CARE: Pt alert and oriented x 4. Support provided to pt surrounding her hesitancy about SNF, but CSW provided positive reinforcement that pt agreeable for short term rehab. Pt aware of plan to discharge today and agreeable to plan for SNF this afternoon.   Alison Murray, MSW, [place] [facility] [phone]

## 2014-10-02 ENCOUNTER — Telehealth: Payer: Self-pay | Admitting: *Deleted

## 2014-10-02 NOTE — Telephone Encounter (Signed)
Diane. Please clarify.

## 2014-10-02 NOTE — Telephone Encounter (Signed)
Received message from grandson Gracy Racer [facility] ) requesting to speak with Dr. Julien Nordmann.  Spoke with [name], and was informed that pt was discharged to [facility] last night 10/01/14 from the hospital.  [name] stated pt had coughing up blood clots  while in the hospital.  Upon asking for further information, [name] stated he would like to speak with Dr. Julien Nordmann to update md of his grandmother's condition. Message sent to Dr. Julien Nordmann and Shauna Hugh, desk nurse today. [name]'s phone    [phone].

## 2014-10-03 ENCOUNTER — Telehealth: Payer: Self-pay | Admitting: *Deleted

## 2014-10-03 NOTE — Telephone Encounter (Signed)
TCF daughter who is at pt's home. Daughter states that pt. Has coughed up small amount blood yesterday and today-very small clots.  Daughter asking what she should do. Pt. Apparently had been on blood thinners at time of discharge from hospital but is no longer on them except for 1 baby aspirin a day. Denies any change in shortness of breath. Pt. At baseline according to daughter and pt. Denies dizzyness, headache, light headedness or fever.  Offered visit in Symptom Management clinic, but if condition worsened pt should go to ED.  Daughter states she will monitor situation and call back if she wants her mother to be seen in [facility] or if goes to ED. Daughter states she is thankful for advice. Pt. Had appt with Harvie Junior, PA, labs  and chemo on 10/08/14.

## 2014-10-08 ENCOUNTER — Encounter: Payer: Self-pay | Admitting: Physician Assistant

## 2014-10-08 ENCOUNTER — Other Ambulatory Visit (HOSPITAL_BASED_OUTPATIENT_CLINIC_OR_DEPARTMENT_OTHER): Payer: Medicare Other

## 2014-10-08 ENCOUNTER — Ambulatory Visit (HOSPITAL_BASED_OUTPATIENT_CLINIC_OR_DEPARTMENT_OTHER): Payer: Medicare Other | Admitting: Physician Assistant

## 2014-10-08 ENCOUNTER — Telehealth: Payer: Self-pay | Admitting: Internal Medicine

## 2014-10-08 ENCOUNTER — Ambulatory Visit (HOSPITAL_BASED_OUTPATIENT_CLINIC_OR_DEPARTMENT_OTHER): Payer: Medicare Other

## 2014-10-08 VITALS — BP 130/66 | HR 100 | Temp 97.7°F | Resp 16 | Ht 62.0 in | Wt 209.0 lb

## 2014-10-08 DIAGNOSIS — C3412 Malignant neoplasm of upper lobe, left bronchus or lung: Secondary | ICD-10-CM

## 2014-10-08 DIAGNOSIS — C3411 Malignant neoplasm of upper lobe, right bronchus or lung: Secondary | ICD-10-CM

## 2014-10-08 DIAGNOSIS — R042 Hemoptysis: Secondary | ICD-10-CM

## 2014-10-08 DIAGNOSIS — Z5111 Encounter for antineoplastic chemotherapy: Secondary | ICD-10-CM | POA: Diagnosis not present

## 2014-10-08 DIAGNOSIS — C3490 Malignant neoplasm of unspecified part of unspecified bronchus or lung: Secondary | ICD-10-CM

## 2014-10-08 LAB — COMPREHENSIVE METABOLIC PANEL (CC13)
ALBUMIN: 2.7 g/dL — AB (ref 3.5–5.0)
ALT: 36 U/L (ref 0–55)
ANION GAP: 13 meq/L — AB (ref 3–11)
AST: 25 U/L (ref 5–34)
Alkaline Phosphatase: 288 U/L — ABNORMAL HIGH (ref 40–150)
BUN: 32 mg/dL — ABNORMAL HIGH (ref 7.0–26.0)
CO2: 20 mEq/L — ABNORMAL LOW (ref 22–29)
CREATININE: 1.1 mg/dL (ref 0.6–1.1)
Calcium: 9.5 mg/dL (ref 8.4–10.4)
Chloride: 105 mEq/L (ref 98–109)
EGFR: 52 mL/min/{1.73_m2} — ABNORMAL LOW (ref >90)
Glucose: 166 mg/dl — ABNORMAL HIGH (ref 70–140)
POTASSIUM: 4.8 meq/L (ref 3.5–5.1)
SODIUM: 138 meq/L (ref 136–145)
TOTAL PROTEIN: 7.7 g/dL (ref 6.4–8.3)
Total Bilirubin: 0.32 mg/dL (ref 0.20–1.20)

## 2014-10-08 LAB — CBC WITH DIFFERENTIAL/PLATELET
BASO%: 0.2 % (ref 0.0–2.0)
Basophils Absolute: 0 10*3/uL (ref 0.0–0.1)
EOS ABS: 0 10*3/uL (ref 0.0–0.5)
EOS%: 0 % (ref 0.0–7.0)
HCT: 40 % (ref 34.8–46.6)
HGB: 12.8 g/dL (ref 11.6–15.9)
LYMPH#: 0.6 10*3/uL — AB (ref 0.9–3.3)
LYMPH%: 15.4 % (ref 14.0–49.7)
MCH: 30.6 pg (ref 25.1–34.0)
MCHC: 32 g/dL (ref 31.5–36.0)
MCV: 95.7 fL (ref 79.5–101.0)
MONO#: 0.1 10*3/uL (ref 0.1–0.9)
MONO%: 1.2 % (ref 0.0–14.0)
NEUT#: 3.4 10*3/uL (ref 1.5–6.5)
NEUT%: 83.2 % — AB (ref 38.4–76.8)
PLATELETS: 274 10*3/uL (ref 145–400)
RBC: 4.18 10*6/uL (ref 3.70–5.45)
RDW: 15.5 % — ABNORMAL HIGH (ref 11.2–14.5)
WBC: 4.1 10*3/uL (ref 3.9–10.3)

## 2014-10-08 MED ORDER — PEMETREXED DISODIUM CHEMO INJECTION 500 MG
400.0000 mg/m2 | Freq: Once | INTRAVENOUS | Status: AC
  Administered 2014-10-08: 850 mg via INTRAVENOUS
  Filled 2014-10-08: qty 34

## 2014-10-08 MED ORDER — SODIUM CHLORIDE 0.9 % IV SOLN
Freq: Once | INTRAVENOUS | Status: AC
  Administered 2014-10-08: 10:00:00 via INTRAVENOUS
  Filled 2014-10-08: qty 4

## 2014-10-08 MED ORDER — SODIUM CHLORIDE 0.9 % IV SOLN
Freq: Once | INTRAVENOUS | Status: AC
  Administered 2014-10-08: 10:00:00 via INTRAVENOUS

## 2014-10-08 NOTE — Progress Notes (Addendum)
[facility] Telephone:([phone]) [phone]   Fax:([phone]) [phone]  OFFICE PROGRESS NOTE  [name], MD [place] [address]  DIAGNOSIS: Lung cancer  Primary site: Lung (Right)  Staging method: AJCC 7th Edition  Clinical: Stage IIIA (T3, N2, M0) signed by Curt Bears, MD on 01/01/2014 6:57 PM  Summary: Stage IIIA (T3, N2, M0)   PRIOR THERAPY: Systemic chemotherapy with carboplatin for an AUC of 4 and Alimta 400 mg/m2 given in 3 weeks. Status post 6 cycles, last dose was given 05/07/2014 with partial response.   CURRENT THERAPY: Systemic chemotherapy with single agent Alimta 400 MG/M2 every C weeks. First dose expected 09/17/2014.  DISEASE STAGE:  Lung cancer  Primary site: Lung (Right)  Staging method: AJCC 7th Edition  Clinical: Stage IIIA (T3, N2, M0) signed by Curt Bears, MD on 01/01/2014 6:57 PM  Summary: Stage IIIA (T3, N2, M0)  CHEMOTHERAPY INTENT: palliative  CURRENT # OF CHEMOTHERAPY CYCLES: 1 CURRENT ANTIEMETICS: compazine  CURRENT SMOKING STATUS: current smoker  ORAL CHEMOTHERAPY AND CONSENT: n/a  CURRENT BISPHOSPHONATES USE: none  PAIN MANAGEMENT: hydrocodone  NARCOTICS INDUCED CONSTIPATION: none  LIVING WILL AND CODE STATUS:   INTERVAL HISTORY: Beverly Lewis 81 y.o. female returns to the clinic today for followup visit accompanied by her grandson. Her first cycle with single agent Alimta has been postponed secondary to a left humeral fracture and cellulitis involving her groin area. She is currently at a SNF for rehabilitation. She reports she will finish her antibiotics in the next couple of days. She has a Foley catheter in place.She has lost some weight but admits to not eating very well at the SNF. She doesn't like the food. She is feeling fine today with no specific complaints.  She denied having any nausea or vomiting. She has no fever or chills. She denied having any weight loss or night sweats. She has no chest  pain but continues to have shortness of breath at baseline and increased with exertion with no cough or hemoptysis. T  MEDICAL HISTORY: Past Medical History  Diagnosis Date  . Hypertension   . Hyperlipidemia   . Cerebrovascular accident history of mild cerebrovascular accident which caused some visual disturbance.   . Diabetes mellitus   . Morbid obesity   . Lung mass     Right upper Lobe  . Shortness of breath   . Pneumonia     hx  . Peripheral vascular disease     hx blood clot 30 yrs ago leg  . GERD (gastroesophageal reflux disease)   . Arthritis   . Pneumothorax, iatrogenic 8/2/113-discharge note    Post Biospy  . Cough with hemoptysis 01/22/12    Prior to discharge  . Tachycardia 01/22/12    Prior to discharge  . History of radiation therapy 02/18/12,02/23/12,02/25/12., 03/01/12,&03/03/12    RULlung 50Gy/5/fx  . Lung cancer 01/21/12    ALLERGIES:  is allergic to asa buff (mag.  MEDICATIONS:  Current Outpatient Prescriptions  Medication Sig Dispense Refill  . acetaminophen (TYLENOL) 500 MG tablet Take 500 mg by mouth every 6 (six) hours as needed for moderate pain (inflammation).    Marland Kitchen allopurinol (ZYLOPRIM) 100 MG tablet Take 100 mg by mouth daily.     Marland Kitchen amLODipine (NORVASC) 10 MG tablet Take 10 mg by mouth daily.      Marland Kitchen aspirin EC 81 MG tablet Take 81 mg by mouth daily.    Marland Kitchen atorvastatin (LIPITOR) 20 MG tablet Take 20  mg by mouth daily.      . cholecalciferol (VITAMIN D) 1000 UNITS tablet Take 1,000 Units by mouth daily.    Marland Kitchen COLCRYS 0.6 MG tablet Take 0.6 mg by mouth 2 (two) times daily as needed (gout). For gout    . eprosartan-hydrochlorothiazide (TEVETEN HCT) 600-12.5 MG per tablet Take 1 tablet by mouth daily.     . fluticasone (FLONASE) 50 MCG/ACT nasal spray Place 1 spray into both nostrils daily.     . folic acid (FOLVITE) 1 MG tablet Take 1 mg by mouth daily.    Marland Kitchen HYDROcodone-acetaminophen (NORCO/VICODIN) 5-325 MG per tablet Take 1 tablet by mouth every 6 (six) hours as  needed for moderate pain. 15 tablet 0  . JANUVIA 100 MG tablet Take 100 mg by mouth daily.     Marland Kitchen losartan-hydrochlorothiazide (HYZAAR) 100-12.5 MG per tablet Take 1 tablet by mouth daily.    Marland Kitchen nystatin (MYCOSTATIN) powder Apply topically 4 (four) times daily. 15 g 0  . omeprazole (PRILOSEC) 20 MG capsule Take 20 mg by mouth daily.    Marland Kitchen oxyCODONE-acetaminophen (PERCOCET/ROXICET) 5-325 MG per tablet Take by mouth every 4 (four) hours as needed for severe pain.    Marland Kitchen NEXIUM 40 MG capsule Take 40 mg by mouth daily before breakfast.      No current facility-administered medications for this visit.    SURGICAL HISTORY:  Past Surgical History  Procedure Laterality Date  . Abdominal aortic aneurysm repair  06/28/10    Stent Graft repair    . Exploratory laparotomy      For ectopic pregnancy  . Lumbar disc surgery    . Aortogram   03/03/11    with stenting , left external iliac artery  . Appendectomy    . Navigational bronchoscopy      Right Upper Lobe Mass with Biopsies, Brushings, Washings, and bronchoalveolar Lavage:  . Paratracheal adenopathy and bilateral adrenal nodules      REVIEW OF SYSTEMS:  Constitutional: positive for fatigue Eyes: negative Ears, nose, mouth, throat, and face: negative Respiratory: positive for cough and dyspnea on exertion Cardiovascular: negative Gastrointestinal: negative Genitourinary:negative Integument/breast: negative Hematologic/lymphatic: negative Musculoskeletal:negative Neurological: negative Behavioral/Psych: negative Endocrine: negative Allergic/Immunologic: negative   PHYSICAL EXAMINATION: General appearance: alert, cooperative, fatigued and no distress Head: Normocephalic, without obvious abnormality, atraumatic Neck: no adenopathy, no JVD, supple, symmetrical, trachea midline and thyroid not enlarged, symmetric, no tenderness/mass/nodules Lymph nodes: Cervical, supraclavicular, and axillary nodes normal. Resp: clear to auscultation  bilaterally Back: symmetric, no curvature. ROM normal. No CVA tenderness. Cardio: regular rate and rhythm, S1, S2 normal, no murmur, click, rub or gallop GI: soft, non-tender; bowel sounds normal; no masses,  no organomegaly Extremities: 1+ edema bilaterally Neurologic: Alert and oriented X 3, normal strength and tone. Normal symmetric reflexes. Normal coordination and gait  ECOG PERFORMANCE STATUS: 2 - Symptomatic, <50% confined to bed  Blood pressure 130/66, pulse 100, temperature 97.7 F (36.5 C), temperature source Oral, resp. rate 16, height _0  (1.575 m), weight 209 lb (94.802 kg), SpO2 100 %.  LABORATORY DATA: Lab Results  Component Value Date   WBC 4.1 10/08/2014   HGB 12.8 10/08/2014   HCT 40.0 10/08/2014   MCV 95.7 10/08/2014   PLT 274 10/08/2014      Chemistry      Component Value Date/Time   NA 138 10/08/2014 0850   NA 142 10/01/2014 0522   K 4.8 10/08/2014 0850   K 4.5 10/01/2014 0522   CL 114* 10/01/2014 0522  CO2 20* 10/08/2014 0850   CO2 21 10/01/2014 0522   BUN 32.0* 10/08/2014 0850   BUN 30* 10/01/2014 0522   CREATININE 1.1 10/08/2014 0850   CREATININE 1.04 10/01/2014 0522   CREATININE 1.10 10/23/2013 1118      Component Value Date/Time   CALCIUM 9.5 10/08/2014 0850   CALCIUM 9.1 10/01/2014 0522   ALKPHOS 288* 10/08/2014 0850   ALKPHOS 281* 09/28/2014 0008   AST 25 10/08/2014 0850   AST 45* 09/28/2014 0008   ALT 36 10/08/2014 0850   ALT 73* 09/28/2014 0008   BILITOT 0.32 10/08/2014 0850   BILITOT 0.8 09/28/2014 0008       RADIOGRAPHIC STUDIES: Ct Head Wo Contrast  09/10/2014   CLINICAL DATA:  [place] and hit head.  EXAM: CT HEAD WITHOUT CONTRAST  CT CERVICAL SPINE WITHOUT CONTRAST  TECHNIQUE: Multidetector CT imaging of the head and cervical spine was performed following the standard protocol without intravenous contrast. Multiplanar CT image reconstructions of the cervical spine were also generated.  COMPARISON:  None.  FINDINGS: CT HEAD  FINDINGS  There is a large right final scalp hematoma. No underlying skull fracture. Hyperostosis frontalis interna is noted. There is age related cerebral atrophy, ventriculomegaly and fairly extensive periventricular white matter disease. No extra-axial fluid collection or subdural hematoma. No findings for hemispheric infarction. No mass lesion. The brainstem and cerebellum are grossly normal. The paranasal sinuses and mastoid air cells are grossly clear.  CT CERVICAL SPINE FINDINGS  Severe degenerative cervical spondylosis with multilevel disc disease and facet disease. No definite acute fracture. The skullbase C1 and C1-2 articulations are maintained. The dens is intact. Bilateral carotid artery calcifications are noted. The visualized lung apices are clear.  IMPRESSION: No acute intracranial findings or skull fracture. Large right frontal scalp hematoma.  Severe degenerative cervical spondylosis but no definite acute fracture.   Electronically Signed   By: Marijo Sanes M.D.   On: 09/10/2014 21:26   Ct Cervical Spine Wo Contrast  09/10/2014   CLINICAL DATA:  [place] and hit head.  EXAM: CT HEAD WITHOUT CONTRAST  CT CERVICAL SPINE WITHOUT CONTRAST  TECHNIQUE: Multidetector CT imaging of the head and cervical spine was performed following the standard protocol without intravenous contrast. Multiplanar CT image reconstructions of the cervical spine were also generated.  COMPARISON:  None.  FINDINGS: CT HEAD FINDINGS  There is a large right final scalp hematoma. No underlying skull fracture. Hyperostosis frontalis interna is noted. There is age related cerebral atrophy, ventriculomegaly and fairly extensive periventricular white matter disease. No extra-axial fluid collection or subdural hematoma. No findings for hemispheric infarction. No mass lesion. The brainstem and cerebellum are grossly normal. The paranasal sinuses and mastoid air cells are grossly clear.  CT CERVICAL SPINE FINDINGS  Severe degenerative  cervical spondylosis with multilevel disc disease and facet disease. No definite acute fracture. The skullbase C1 and C1-2 articulations are maintained. The dens is intact. Bilateral carotid artery calcifications are noted. The visualized lung apices are clear.  IMPRESSION: No acute intracranial findings or skull fracture. Large right frontal scalp hematoma.  Severe degenerative cervical spondylosis but no definite acute fracture.   Electronically Signed   By: Marijo Sanes M.D.   On: 09/10/2014 21:26   Dg Knee Complete 4 Views Left  09/30/2014   CLINICAL DATA:  81 year old female with fall today and left knee injury and pain. Initial encounter.  EXAM: LEFT KNEE - COMPLETE 4+ VIEW  COMPARISON:  None. This exam was interpreted during a PACS  downtime with limited availability of comparison cases.  FINDINGS: No acute fracture, no acute fracture, subluxation or dislocation identified.  Tricompartmental degenerative changes are noted, severe in the lateral compartment.  There may be a small knee effusion present.  No focal bony lesions are identified.  IMPRESSION: Question small knee effusion.  No acute bony abnormality.  Tricompartmental degenerative changes, severe in the lateral compartment.   Electronically Signed   By: Margarette Canada M.D.   On: 09/30/2014 13:23   Dg Humerus Left  09/10/2014   CLINICAL DATA:  [place] at home today.  Injured left arm.  EXAM: LEFT HUMERUS - 2+ VIEW  COMPARISON:  None.  FINDINGS: There is a complex comminuted fracture involving the proximal humeral shaft. The humeral head and neck are intact. There are a severe degenerative changes involving the glenohumeral joint. The elbow is grossly intact.  IMPRESSION: Complex comminuted proximal humeral shaft fracture.  Advanced glenohumeral joint degenerative changes.   Electronically Signed   By: Marijo Sanes M.D.   On: 09/10/2014 20:15   ASSESSMENT AND PLAN: This is a very pleasant 81 years old [place] American female with unresectable stage  IIIA non-small cell lung cancer and the patient is not a candidate for radiotherapy. She was treated with systemic chemotherapy with carboplatin and Alimta status post 6 cycles. She is tolerated her treatment fairly well except for the fatigue. She had been on observation for the last 3 months. The recent CT scan of the chest showed evidence for disease progression with increase in the volume of the right upper lobe lobular mass. The patient was discussed with and also seen by Dr. Julien Nordmann. She will resume systemic chemotherapy with single agent Alimta at 500 mg/m given every 3 weeks, first cycle to be given today.  She will follow-up in 3 weeks prior to start of cycle #2 the repeat CBC differential and C met. She is to continue on folic acid,1 mg by mouth daily.  She is eager to complete her rehabilitation so that she can return home.  She was advised to call immediately if she has any concerning symptoms in the interval. The patient voices understanding of current disease status and treatment options and is in agreement with the current care plan.  All questions were answered. The patient knows to call the clinic with any problems, questions or concerns. We can certainly see the patient much sooner if necessary.  Carlton Adam, PA-C 10/08/2014  ADDENDUM: Hematology/Oncology Attending: I had a face to face encounter with the patient. I recommended her care plan. This is a very pleasant 81 years old African-American female with progressive non-small cell lung cancer, adenocarcinoma. She status post 6 cycles of systemic chemotherapy with carboplatin and Alimta with partial response. The patient was supposed to start systemic chemotherapy again with single agent Alimta after she has evidence for disease progression and the most recent CT scan of the chest. Her treatment was delayed secondary to a fracture of her left hand. She is currently in a rehabilitation center and expected to go home  soon. We will arrange for the patient to proceed with her treatment as scheduled today. She would come back for follow-up visit in 3 weeks for evaluation before proceeding with cycle #2 of her systemic chemotherapy. She was advised to call immediately if she has any concerning symptoms in the interval.  Disclaimer: This note was dictated with voice recognition software. Similar sounding words can inadvertently be transcribed and may not be corrected upon review. MOHAMED,MOHAMED  K., MD 10/08/2014

## 2014-10-08 NOTE — Telephone Encounter (Signed)
gave and printed appt sched adna vs fo rpt for May....sed added tx.

## 2014-10-08 NOTE — Patient Instructions (Signed)
Follow up in 3 weeks, prior to your next scheduled cycle of chemotherapy

## 2014-10-08 NOTE — Patient Instructions (Signed)
[facility] Discharge Instructions for Patients Receiving Chemotherapy  Today you received the following chemotherapy agents:  Alimta  To help prevent nausea and vomiting after your treatment, we encourage you to take your nausea medication as ordered per MD.   If you develop nausea and vomiting that is not controlled by your nausea medication, call the clinic.   BELOW ARE SYMPTOMS THAT SHOULD BE REPORTED IMMEDIATELY:  *FEVER GREATER THAN 100.5 F  *CHILLS WITH OR WITHOUT FEVER  NAUSEA AND VOMITING THAT IS NOT CONTROLLED WITH YOUR NAUSEA MEDICATION  *UNUSUAL SHORTNESS OF BREATH  *UNUSUAL BRUISING OR BLEEDING  TENDERNESS IN MOUTH AND THROAT WITH OR WITHOUT PRESENCE OF ULCERS  *URINARY PROBLEMS  *BOWEL PROBLEMS  UNUSUAL RASH Items with * indicate a potential emergency and should be followed up as soon as possible.  Feel free to call the clinic you have any questions or concerns. The clinic phone number is [phone].  Please show the [facility] at check-in to the Emergency Department and triage nurse.

## 2014-10-09 ENCOUNTER — Other Ambulatory Visit: Payer: Self-pay | Admitting: Physician Assistant

## 2014-10-09 ENCOUNTER — Telehealth: Payer: Self-pay | Admitting: Physician Assistant

## 2014-10-09 ENCOUNTER — Encounter: Payer: Self-pay | Admitting: Physician Assistant

## 2014-10-09 NOTE — Progress Notes (Signed)
Patient referred to Dr. Pablo Ledger for consideration for radiation therapy regarding hemoptysis. Patient's family member asked that all appointments be made on the same day as there is a CT for transportation from the skilled nursing facility that her mother, Beverly Lewis is in. Issues discussed with Dr. Julien Nordmann who agreed to Ms. chemotherapy from 10/29/2014 to 10/31/2014 to allow for Dr. Unknown Jim appointment. She will have lab, provider appointment and chemotherapy on 10/31/2014 and see Dr. Pablo Ledger at 1:30 on 511 per Santiago Glad in radiation oncology.  Carlton Adam, PA-C 10/09/2014

## 2014-10-09 NOTE — Telephone Encounter (Signed)
spoke with daughter and a calendar has been mailed to the patient

## 2014-10-18 ENCOUNTER — Ambulatory Visit: Payer: Medicare Other

## 2014-10-18 ENCOUNTER — Ambulatory Visit: Payer: Medicare Other | Admitting: Radiation Oncology

## 2014-10-29 ENCOUNTER — Other Ambulatory Visit: Payer: Medicare Other

## 2014-10-29 ENCOUNTER — Telehealth: Payer: Self-pay | Admitting: *Deleted

## 2014-10-29 ENCOUNTER — Ambulatory Visit: Payer: Medicare Other | Admitting: Oncology

## 2014-10-29 ENCOUNTER — Ambulatory Visit: Payer: Medicare Other

## 2014-10-29 NOTE — Telephone Encounter (Signed)
VERBAL ORDER AND READ BACK TO DR.MOHAMED- NEED TO CONTACT PT.'S PRIMARY CARE PHYSICIAN FOR HOME HEALTH CARE. NOTIFIED [facility] WITH [facility].

## 2014-10-31 ENCOUNTER — Inpatient Hospital Stay: Admission: RE | Admit: 2014-10-31 | Payer: Medicare Other | Source: Ambulatory Visit | Admitting: Radiation Oncology

## 2014-10-31 ENCOUNTER — Ambulatory Visit (HOSPITAL_BASED_OUTPATIENT_CLINIC_OR_DEPARTMENT_OTHER): Payer: Medicare Other | Admitting: Physician Assistant

## 2014-10-31 ENCOUNTER — Telehealth: Payer: Self-pay

## 2014-10-31 ENCOUNTER — Ambulatory Visit: Payer: Medicare Other

## 2014-10-31 ENCOUNTER — Inpatient Hospital Stay: Admission: RE | Admit: 2014-10-31 | Payer: Medicare Other | Source: Ambulatory Visit

## 2014-10-31 ENCOUNTER — Ambulatory Visit
Admission: RE | Admit: 2014-10-31 | Discharge: 2014-10-31 | Disposition: A | Discharge status: Home / Self Care | Payer: Medicare Other | Source: Ambulatory Visit | Attending: Radiation Oncology | Admitting: Radiation Oncology

## 2014-10-31 ENCOUNTER — Encounter: Payer: Self-pay | Admitting: Physician Assistant

## 2014-10-31 ENCOUNTER — Ambulatory Visit (HOSPITAL_BASED_OUTPATIENT_CLINIC_OR_DEPARTMENT_OTHER): Payer: Medicare Other

## 2014-10-31 ENCOUNTER — Telehealth: Payer: Self-pay | Admitting: Internal Medicine

## 2014-10-31 ENCOUNTER — Other Ambulatory Visit (HOSPITAL_BASED_OUTPATIENT_CLINIC_OR_DEPARTMENT_OTHER): Payer: Medicare Other

## 2014-10-31 VITALS — BP 102/53 | HR 96 | Temp 98.3°F | Resp 18 | Ht 62.0 in

## 2014-10-31 DIAGNOSIS — C3411 Malignant neoplasm of upper lobe, right bronchus or lung: Secondary | ICD-10-CM | POA: Diagnosis not present

## 2014-10-31 DIAGNOSIS — C3490 Malignant neoplasm of unspecified part of unspecified bronchus or lung: Secondary | ICD-10-CM

## 2014-10-31 DIAGNOSIS — Z5111 Encounter for antineoplastic chemotherapy: Secondary | ICD-10-CM | POA: Diagnosis not present

## 2014-10-31 DIAGNOSIS — C3412 Malignant neoplasm of upper lobe, left bronchus or lung: Secondary | ICD-10-CM

## 2014-10-31 LAB — CBC WITH DIFFERENTIAL/PLATELET
BASO%: 0.6 % (ref 0.0–2.0)
Basophils Absolute: 0.1 10*3/uL (ref 0.0–0.1)
EOS ABS: 0.1 10*3/uL (ref 0.0–0.5)
EOS%: 0.8 % (ref 0.0–7.0)
HCT: 34.6 % — ABNORMAL LOW (ref 34.8–46.6)
HGB: 11.5 g/dL — ABNORMAL LOW (ref 11.6–15.9)
LYMPH%: 13.3 % — AB (ref 14.0–49.7)
MCH: 31.3 pg (ref 25.1–34.0)
MCHC: 33.2 g/dL (ref 31.5–36.0)
MCV: 94.5 fL (ref 79.5–101.0)
MONO#: 1.3 10*3/uL — AB (ref 0.1–0.9)
MONO%: 11.9 % (ref 0.0–14.0)
NEUT#: 8 10*3/uL — ABNORMAL HIGH (ref 1.5–6.5)
NEUT%: 73.4 % (ref 38.4–76.8)
PLATELETS: 419 10*3/uL — AB (ref 145–400)
RBC: 3.66 10*6/uL — AB (ref 3.70–5.45)
RDW: 16.9 % — AB (ref 11.2–14.5)
WBC: 10.9 10*3/uL — ABNORMAL HIGH (ref 3.9–10.3)
lymph#: 1.4 10*3/uL (ref 0.9–3.3)

## 2014-10-31 LAB — COMPREHENSIVE METABOLIC PANEL (CC13)
ALBUMIN: 2.5 g/dL — AB (ref 3.5–5.0)
ALK PHOS: 242 U/L — AB (ref 40–150)
ALT: 23 U/L (ref 0–55)
AST: 28 U/L (ref 5–34)
Anion Gap: 12 mEq/L — ABNORMAL HIGH (ref 3–11)
BUN: 25.1 mg/dL (ref 7.0–26.0)
CO2: 22 mEq/L (ref 22–29)
Calcium: 8.9 mg/dL (ref 8.4–10.4)
Chloride: 105 mEq/L (ref 98–109)
Creatinine: 1 mg/dL (ref 0.6–1.1)
EGFR: 58 mL/min/{1.73_m2} — ABNORMAL LOW (ref >90)
Glucose: 111 mg/dl (ref 70–140)
POTASSIUM: 4 meq/L (ref 3.5–5.1)
Sodium: 139 mEq/L (ref 136–145)
Total Bilirubin: 0.26 mg/dL (ref 0.20–1.20)
Total Protein: 6.9 g/dL (ref 6.4–8.3)

## 2014-10-31 MED ORDER — SODIUM CHLORIDE 0.9 % IV SOLN
Freq: Once | INTRAVENOUS | Status: AC
  Administered 2014-10-31: 11:00:00 via INTRAVENOUS
  Filled 2014-10-31: qty 4

## 2014-10-31 MED ORDER — SODIUM CHLORIDE 0.9 % IV SOLN
Freq: Once | INTRAVENOUS | Status: AC
  Administered 2014-10-31: 11:00:00 via INTRAVENOUS

## 2014-10-31 MED ORDER — SODIUM CHLORIDE 0.9 % IV SOLN
385.0000 mg/m2 | Freq: Once | INTRAVENOUS | Status: AC
  Administered 2014-10-31: 800 mg via INTRAVENOUS
  Filled 2014-10-31: qty 32

## 2014-10-31 NOTE — Telephone Encounter (Signed)
Gave and printed appt sched and avs for pt for May and June  °

## 2014-10-31 NOTE — Telephone Encounter (Signed)
Spoke with Cameo RN med/onc infusion room.Patient hasn't had any hemoptysis in 2 weeks.Cancel appointment per Dr.Wentworth if no active bleeding.She will inform patient.

## 2014-10-31 NOTE — Progress Notes (Addendum)
[place] [phone]) [phone]   [phone]  OFFICE PROGRESS NOTE  [name], MD [place] [address]  DIAGNOSIS: Lung cancer  Primary site: Lung (Right)  Staging method: AJCC 7th Edition  Clinical: Stage IIIA (T3, N2, M0) signed by Curt Bears, MD on 01/01/2014 6:57 PM  Summary: Stage IIIA (T3, N2, M0)   PRIOR THERAPY: Systemic chemotherapy with carboplatin for an AUC of 4 and Alimta 400 mg/m2 given in 3 weeks. Status post 6 cycles, last dose was given 05/07/2014 with partial response.   CURRENT THERAPY: Systemic chemotherapy with single agent Alimta 400 MG/M2 every C weeks. First dose given 09/17/2014.  DISEASE STAGE:  Lung cancer  Primary site: Lung (Right)  Staging method: AJCC 7th Edition  Clinical: Stage IIIA (T3, N2, M0) signed by Curt Bears, MD on 01/01/2014 6:57 PM  Summary: Stage IIIA (T3, N2, M0)  CHEMOTHERAPY INTENT: palliative  CURRENT # OF CHEMOTHERAPY CYCLES: 1 CURRENT ANTIEMETICS: compazine  CURRENT SMOKING STATUS: current smoker  ORAL CHEMOTHERAPY AND CONSENT: n/a  CURRENT BISPHOSPHONATES USE: none  PAIN MANAGEMENT: hydrocodone  NARCOTICS INDUCED CONSTIPATION: none  LIVING WILL AND CODE STATUS:   INTERVAL HISTORY: Beverly Lewis 81 y.o. female returns to the clinic today for followup visit accompanied by a family member . Her first cycle with single agent Alimta has been postponed secondary to a left humeral fracture and cellulitis involving her groin area. She is now at home and reports that she is eating a bit better. Her appetite have been decreased when she was in the facility because she did not care for the food. She request a refill for her dexamethasone. She presents to proceed with cycle #2 of her systemic chemotherapy with single agent Alimta.  She has a Foley catheter in place. She is feeling fine today with no specific complaints.  She denied having any nausea or vomiting. She has  no fever or chills. She denied having any weight loss or night sweats. She has no chest pain but continues to have shortness of breath at baseline and increased with exertion with no cough or hemoptysis. She had a few episodes of hemoptysis while she was in the skilled nursing facility but has had no reported episodes of hemoptysis for the past week. She was to be evaluated by Dr. Pablo Ledger for the hemoptysis however since this has resolved we will postpone of this radiation oncology evaluation for now.  MEDICAL HISTORY: Past Medical History  Diagnosis Date  . Hypertension   . Hyperlipidemia   . Cerebrovascular accident history of mild cerebrovascular accident which caused some visual disturbance.   . Diabetes mellitus   . Morbid obesity   . Lung mass     Right upper Lobe  . Shortness of breath   . Pneumonia     hx  . Peripheral vascular disease     hx blood clot 30 yrs ago leg  . GERD (gastroesophageal reflux disease)   . Arthritis   . Pneumothorax, iatrogenic 8/2/113-discharge note    Post Biospy  . Cough with hemoptysis 01/22/12    Prior to discharge  . Tachycardia 01/22/12    Prior to discharge  . History of radiation therapy 02/18/12,02/23/12,02/25/12., 03/01/12,&03/03/12    RULlung 50Gy/5/fx  . Lung cancer 01/21/12    ALLERGIES:  is allergic to asa buff (mag.  MEDICATIONS:  Current Outpatient Prescriptions  Medication Sig Dispense Refill  . acetaminophen (TYLENOL) 500 MG tablet Take 500 mg by  mouth every 6 (six) hours as needed for moderate pain (inflammation).    Marland Kitchen amLODipine (NORVASC) 10 MG tablet Take 10 mg by mouth daily.      Marland Kitchen aspirin EC 81 MG tablet Take 81 mg by mouth daily.    Marland Kitchen atorvastatin (LIPITOR) 20 MG tablet Take 20 mg by mouth daily.      . cholecalciferol (VITAMIN D) 1000 UNITS tablet Take 1,000 Units by mouth daily.    Marland Kitchen COLCRYS 0.6 MG tablet Take 0.6 mg by mouth 2 (two) times daily as needed (gout). For gout    . eprosartan-hydrochlorothiazide (TEVETEN HCT)  600-12.5 MG per tablet Take 1 tablet by mouth daily.     . fluticasone (FLONASE) 50 MCG/ACT nasal spray Place 1 spray into both nostrils daily.     . folic acid (FOLVITE) 1 MG tablet Take 1 mg by mouth daily.    Marland Kitchen HYDROcodone-acetaminophen (NORCO/VICODIN) 5-325 MG per tablet Take 1 tablet by mouth every 6 (six) hours as needed for moderate pain. 15 tablet 0  . JANUVIA 100 MG tablet Take 100 mg by mouth daily.     Marland Kitchen losartan-hydrochlorothiazide (HYZAAR) 100-12.5 MG per tablet Take 1 tablet by mouth daily.    Marland Kitchen NEXIUM 40 MG capsule Take 40 mg by mouth daily before breakfast.     . allopurinol (ZYLOPRIM) 100 MG tablet Take 100 mg by mouth daily.     Marland Kitchen nystatin (MYCOSTATIN) powder Apply topically 4 (four) times daily. (Patient not taking: Reported on 10/31/2014) 15 g 0  . omeprazole (PRILOSEC) 20 MG capsule Take 20 mg by mouth daily.    Marland Kitchen oxyCODONE-acetaminophen (PERCOCET/ROXICET) 5-325 MG per tablet Take by mouth every 4 (four) hours as needed for severe pain.     No current facility-administered medications for this visit.    SURGICAL HISTORY:  Past Surgical History  Procedure Laterality Date  . Abdominal aortic aneurysm repair  06/28/10    Stent Graft repair    . Exploratory laparotomy      For ectopic pregnancy  . Lumbar disc surgery    . Aortogram   03/03/11    with stenting , left external iliac artery  . Appendectomy    . Navigational bronchoscopy      Right Upper Lobe Mass with Biopsies, Brushings, Washings, and bronchoalveolar Lavage:  . Paratracheal adenopathy and bilateral adrenal nodules      REVIEW OF SYSTEMS:  Constitutional: positive for fatigue Eyes: negative Ears, nose, mouth, throat, and face: negative Respiratory: positive for cough and dyspnea on exertion Cardiovascular: negative Gastrointestinal: negative Genitourinary:negative Integument/breast: negative Hematologic/lymphatic: negative Musculoskeletal:negative Neurological: negative Behavioral/Psych:  negative Endocrine: negative Allergic/Immunologic: negative   PHYSICAL EXAMINATION: General appearance: alert, cooperative, fatigued and no distress Head: Normocephalic, without obvious abnormality, atraumatic Neck: no adenopathy, no JVD, supple, symmetrical, trachea midline and thyroid not enlarged, symmetric, no tenderness/mass/nodules Lymph nodes: Cervical, supraclavicular, and axillary nodes normal. Resp: clear to auscultation bilaterally Back: symmetric, no curvature. ROM normal. No CVA tenderness. Cardio: regular rate and rhythm, S1, S2 normal, no murmur, click, rub or gallop GI: soft, non-tender; bowel sounds normal; no masses,  no organomegaly Extremities: 1+ edema bilaterally Neurologic: Alert and oriented X 3, normal strength and tone. Normal symmetric reflexes. Normal coordination and gait  ECOG PERFORMANCE STATUS: 2 - Symptomatic, <50% confined to bed  Blood pressure 102/53, pulse 96, temperature 98.3 F (36.8 C), temperature source Oral, resp. rate 18, height 5\' 2"  (1.575 m).  LABORATORY DATA: Lab Results  Component Value Date  WBC 10.9* 10/31/2014   HGB 11.5* 10/31/2014   HCT 34.6* 10/31/2014   MCV 94.5 10/31/2014   PLT 419* 10/31/2014      Chemistry      Component Value Date/Time   NA 139 10/31/2014 0909   NA 142 10/01/2014 0522   K 4.0 10/31/2014 0909   K 4.5 10/01/2014 0522   CL 114* 10/01/2014 0522   CO2 22 10/31/2014 0909   CO2 21 10/01/2014 0522   BUN 25.1 10/31/2014 0909   BUN 30* 10/01/2014 0522   CREATININE 1.0 10/31/2014 0909   CREATININE 1.04 10/01/2014 0522   CREATININE 1.10 10/23/2013 1118      Component Value Date/Time   CALCIUM 8.9 10/31/2014 0909   CALCIUM 9.1 10/01/2014 0522   ALKPHOS 242* 10/31/2014 0909   ALKPHOS 281* 09/28/2014 0008   AST 28 10/31/2014 0909   AST 45* 09/28/2014 0008   ALT 23 10/31/2014 0909   ALT 73* 09/28/2014 0008   BILITOT 0.26 10/31/2014 0909   BILITOT 0.8 09/28/2014 0008       RADIOGRAPHIC  STUDIES: No results found. ASSESSMENT AND PLAN: This is a very pleasant 81 years old [place] American female with unresectable stage IIIA non-small cell lung cancer and the patient is not a candidate for radiotherapy. She was treated with systemic chemotherapy with carboplatin and Alimta status post 6 cycles. She is tolerated her treatment fairly well except for the fatigue. She had been on observation. The recent CT scan of the chest showed evidence for disease progression with increase in the volume of the right upper lobe lobular mass. The patient was discussed with and also seen by Dr. Julien Nordmann. She will resume systemic chemotherapy with single agent Alimta at 500 mg/m given every 3 weeks, cycle #2  to be given today.  She will follow-up in 3 weeks prior to start of cycle #3 the repeat CBC differential and C met. She is to continue on folic acid,1 mg by mouth daily. A refill for her dexamethasone was sent her pharmacy of record via E scribed. She is asked to continue to monitor and report any episodes of hemoptysis. Should this again become more commonplace, we will refer her to Dr. Pablo Ledger for evaluation and possible palliative radiation therapy. She is eager to complete her rehabilitation so that she can return home.  She was advised to call immediately if she has any concerning symptoms in the interval. The patient voices understanding of current disease status and treatment options and is in agreement with the current care plan.  All questions were answered. The patient knows to call the clinic with any problems, questions or concerns. We can certainly see the patient much sooner if necessary.  Carlton Adam, PA-C 10/31/2014  ADDENDUM: Hematology/Oncology Attending: I had a face to face encounter with the patient. I recommended her care plan. This is a very pleasant 81 years old African-American female who is currently undergoing treatment with single agent Alimta status post 1  cycle. She tolerated the first cycle of her treatment well with no significant adverse effects. The patient continues to have baseline shortness of breath. She also has a history of hemoptysis but this improved over the last 7 days. She was referred to Dr. Pablo Ledger for consideration of palliative radiotherapy if she has any recurrent hemoptysis. I recommended for the patient to proceed with cycle #2 of her chemotherapy today as a scheduled. She would come back for follow-up visit in 3 weeks for reevaluation before starting cycle #3.  The patient was advised to call immediately if she has any concerning symptoms in the interval.  Disclaimer: This note was dictated with voice recognition software. Similar sounding words can inadvertently be transcribed and may be missed upon review. Eilleen Kempf., MD 11/04/2014

## 2014-10-31 NOTE — Patient Instructions (Signed)
[facility] Discharge Instructions for Patients Receiving Chemotherapy  Today you received the following chemotherapy agent: Alimta   To help prevent nausea and vomiting after your treatment, we encourage you to take your nausea medication as prescribed.    If you develop nausea and vomiting that is not controlled by your nausea medication, call the clinic.   BELOW ARE SYMPTOMS THAT SHOULD BE REPORTED IMMEDIATELY:  *FEVER GREATER THAN 100.5 F  *CHILLS WITH OR WITHOUT FEVER  NAUSEA AND VOMITING THAT IS NOT CONTROLLED WITH YOUR NAUSEA MEDICATION  *UNUSUAL SHORTNESS OF BREATH  *UNUSUAL BRUISING OR BLEEDING  TENDERNESS IN MOUTH AND THROAT WITH OR WITHOUT PRESENCE OF ULCERS  *URINARY PROBLEMS  *BOWEL PROBLEMS  UNUSUAL RASH Items with * indicate a potential emergency and should be followed up as soon as possible.  Feel free to call the clinic you have any questions or concerns. The clinic phone number is [phone].  Please show the [facility] at check-in to the Emergency Department and triage nurse.

## 2014-11-01 MED ORDER — DEXAMETHASONE 4 MG PO TABS: ORAL_TABLET | ORAL | Status: DC

## 2014-11-01 NOTE — Patient Instructions (Signed)
Continue weekly labs as scheduled. Follow up in 3 weeks, prior to your next scheduled cycle of chemotherapy 

## 2014-11-20 ENCOUNTER — Ambulatory Visit: Payer: Medicare Other | Admitting: Vascular Surgery

## 2014-11-20 ENCOUNTER — Ambulatory Visit: Payer: Medicare Other

## 2014-11-20 ENCOUNTER — Ambulatory Visit
Admission: RE | Admit: 2014-11-20 | Discharge: 2014-11-20 | Disposition: A | Discharge status: Home / Self Care | Payer: Medicare Other | Source: Ambulatory Visit | Attending: Vascular Surgery | Admitting: Vascular Surgery

## 2014-11-20 ENCOUNTER — Other Ambulatory Visit: Payer: Medicare Other

## 2014-11-20 DIAGNOSIS — I714 Abdominal aortic aneurysm, without rupture, unspecified: Secondary | ICD-10-CM

## 2014-11-20 DIAGNOSIS — Z48812 Encounter for surgical aftercare following surgery on the circulatory system: Secondary | ICD-10-CM

## 2014-11-20 MED ORDER — IOPAMIDOL (ISOVUE-370) INJECTION 76%
75.0000 mL | Freq: Once | INTRAVENOUS | Status: AC | PRN
  Administered 2014-11-20: 75 mL via INTRAVENOUS

## 2014-11-21 ENCOUNTER — Telehealth: Payer: Self-pay | Admitting: Internal Medicine

## 2014-11-21 ENCOUNTER — Ambulatory Visit (HOSPITAL_BASED_OUTPATIENT_CLINIC_OR_DEPARTMENT_OTHER): Payer: Medicare Other

## 2014-11-21 ENCOUNTER — Other Ambulatory Visit (HOSPITAL_BASED_OUTPATIENT_CLINIC_OR_DEPARTMENT_OTHER): Payer: Medicare Other

## 2014-11-21 ENCOUNTER — Ambulatory Visit (HOSPITAL_BASED_OUTPATIENT_CLINIC_OR_DEPARTMENT_OTHER): Payer: Medicare Other | Admitting: Physician Assistant

## 2014-11-21 ENCOUNTER — Encounter: Payer: Self-pay | Admitting: Physician Assistant

## 2014-11-21 VITALS — BP 99/36 | HR 163 | Temp 98.5°F | Resp 18 | Ht 62.0 in

## 2014-11-21 VITALS — BP 123/61 | HR 77 | Resp 16

## 2014-11-21 DIAGNOSIS — C3411 Malignant neoplasm of upper lobe, right bronchus or lung: Secondary | ICD-10-CM

## 2014-11-21 DIAGNOSIS — C3490 Malignant neoplasm of unspecified part of unspecified bronchus or lung: Secondary | ICD-10-CM

## 2014-11-21 DIAGNOSIS — R0609 Other forms of dyspnea: Secondary | ICD-10-CM | POA: Diagnosis not present

## 2014-11-21 DIAGNOSIS — C3412 Malignant neoplasm of upper lobe, left bronchus or lung: Secondary | ICD-10-CM

## 2014-11-21 DIAGNOSIS — R5383 Other fatigue: Secondary | ICD-10-CM | POA: Diagnosis not present

## 2014-11-21 DIAGNOSIS — Z5111 Encounter for antineoplastic chemotherapy: Secondary | ICD-10-CM | POA: Diagnosis not present

## 2014-11-21 LAB — COMPREHENSIVE METABOLIC PANEL (CC13)
ALT: 15 U/L (ref 0–55)
ANION GAP: 12 meq/L — AB (ref 3–11)
AST: 24 U/L (ref 5–34)
Albumin: 2.3 g/dL — ABNORMAL LOW (ref 3.5–5.0)
Alkaline Phosphatase: 185 U/L — ABNORMAL HIGH (ref 40–150)
BILIRUBIN TOTAL: 0.31 mg/dL (ref 0.20–1.20)
BUN: 12.5 mg/dL (ref 7.0–26.0)
CO2: 18 meq/L — AB (ref 22–29)
Calcium: 8.4 mg/dL (ref 8.4–10.4)
Chloride: 112 mEq/L — ABNORMAL HIGH (ref 98–109)
Creatinine: 0.8 mg/dL (ref 0.6–1.1)
EGFR: 85 mL/min/{1.73_m2} — AB (ref >90)
Glucose: 133 mg/dl (ref 70–140)
Potassium: 3.8 mEq/L (ref 3.5–5.1)
Sodium: 142 mEq/L (ref 136–145)
TOTAL PROTEIN: 6.8 g/dL (ref 6.4–8.3)

## 2014-11-21 LAB — CBC WITH DIFFERENTIAL/PLATELET
BASO%: 0.3 % (ref 0.0–2.0)
Basophils Absolute: 0 10*3/uL (ref 0.0–0.1)
EOS ABS: 0 10*3/uL (ref 0.0–0.5)
EOS%: 0 % (ref 0.0–7.0)
HCT: 35 % (ref 34.8–46.6)
HGB: 11.2 g/dL — ABNORMAL LOW (ref 11.6–15.9)
LYMPH#: 0.6 10*3/uL — AB (ref 0.9–3.3)
LYMPH%: 15 % (ref 14.0–49.7)
MCH: 31.3 pg (ref 25.1–34.0)
MCHC: 32 g/dL (ref 31.5–36.0)
MCV: 97.8 fL (ref 79.5–101.0)
MONO#: 0.1 10*3/uL (ref 0.1–0.9)
MONO%: 2.5 % (ref 0.0–14.0)
NEUT#: 3.2 10*3/uL (ref 1.5–6.5)
NEUT%: 82.2 % — ABNORMAL HIGH (ref 38.4–76.8)
Platelets: 368 10*3/uL (ref 145–400)
RBC: 3.58 10*6/uL — AB (ref 3.70–5.45)
RDW: 19.8 % — AB (ref 11.2–14.5)
WBC: 3.9 10*3/uL (ref 3.9–10.3)

## 2014-11-21 MED ORDER — SODIUM CHLORIDE 0.9 % IJ SOLN
10.0000 mL | INTRAMUSCULAR | Status: DC | PRN
  Filled 2014-11-21: qty 10

## 2014-11-21 MED ORDER — CYANOCOBALAMIN 1000 MCG/ML IJ SOLN
INTRAMUSCULAR | Status: AC
  Filled 2014-11-21: qty 1

## 2014-11-21 MED ORDER — SODIUM CHLORIDE 0.9 % IV SOLN
375.0000 mg/m2 | Freq: Once | INTRAVENOUS | Status: AC
  Administered 2014-11-21: 800 mg via INTRAVENOUS
  Filled 2014-11-21: qty 32

## 2014-11-21 MED ORDER — SODIUM CHLORIDE 0.9 % IV SOLN
Freq: Once | INTRAVENOUS | Status: AC
  Administered 2014-11-21: 12:00:00 via INTRAVENOUS
  Filled 2014-11-21: qty 4

## 2014-11-21 MED ORDER — CYANOCOBALAMIN 1000 MCG/ML IJ SOLN
1000.0000 ug | Freq: Once | INTRAMUSCULAR | Status: AC
  Administered 2014-11-21: 1000 ug via INTRAMUSCULAR

## 2014-11-21 MED ORDER — SODIUM CHLORIDE 0.9 % IV SOLN
Freq: Once | INTRAVENOUS | Status: AC
  Administered 2014-11-21: 12:00:00 via INTRAVENOUS

## 2014-11-21 NOTE — Telephone Encounter (Signed)
Gave and printed appt sched and avs for pt for June and July....the patient didi not want barium

## 2014-11-21 NOTE — Progress Notes (Addendum)
[place] [phone]   [phone]  OFFICE PROGRESS NOTE  [name], MD [place] [address]  DIAGNOSIS: Lung cancer  Primary site: Lung (Right)  Staging method: AJCC 7th Edition  Clinical: Stage IIIA (T3, N2, M0) signed by Curt Bears, MD on 01/01/2014 6:57 PM  Summary: Stage IIIA (T3, N2, M0)   PRIOR THERAPY: Systemic chemotherapy with carboplatin for an AUC of 4 and Alimta 400 mg/m2 given in 3 weeks. Status post 6 cycles, last dose was given 05/07/2014 with partial response.   CURRENT THERAPY: Systemic chemotherapy with single agent Alimta 400 MG/M2 every C weeks. First dose given 09/17/2014. Status post 2 cycles  DISEASE STAGE:  Lung cancer  Primary site: Lung (Right)  Staging method: AJCC 7th Edition  Clinical: Stage IIIA (T3, N2, M0) signed by Curt Bears, MD on 01/01/2014 6:57 PM  Summary: Stage IIIA (T3, N2, M0)  CHEMOTHERAPY INTENT: palliative  CURRENT # OF CHEMOTHERAPY CYCLES: 3 CURRENT ANTIEMETICS: compazine  CURRENT SMOKING STATUS: current smoker  ORAL CHEMOTHERAPY AND CONSENT: n/a  CURRENT BISPHOSPHONATES USE: none  PAIN MANAGEMENT: hydrocodone  NARCOTICS INDUCED CONSTIPATION: none  LIVING WILL AND CODE STATUS:   INTERVAL HISTORY: Beverly Lewis 81 y.o. female returns to the clinic today for followup visit accompanied by a family member . Her first cycle with single agent Alimta has been postponed secondary to a left humeral fracture and cellulitis involving her groin area. She is now at home and reports that she is eating a bit better.  She presents to proceed with cycle #3 of her systemic chemotherapy with single agent Alimta. She is feeling fine today with no specific complaints.  She denied having any nausea or vomiting. She has no fever or chills. She denied having any weight loss or night sweats. She has no chest pain but continues to have shortness of breath at baseline and increased  with exertion with no cough or hemoptysis.   MEDICAL HISTORY: Past Medical History  Diagnosis Date  . Hypertension   . Hyperlipidemia   . Cerebrovascular accident history of mild cerebrovascular accident which caused some visual disturbance.   . Diabetes mellitus   . Morbid obesity   . Lung mass     Right upper Lobe  . Shortness of breath   . Pneumonia     hx  . Peripheral vascular disease     hx blood clot 30 yrs ago leg  . GERD (gastroesophageal reflux disease)   . Arthritis   . Pneumothorax, iatrogenic 8/2/113-discharge note    Post Biospy  . Cough with hemoptysis 01/22/12    Prior to discharge  . Tachycardia 01/22/12    Prior to discharge  . History of radiation therapy 02/18/12,02/23/12,02/25/12., 03/01/12,&03/03/12    RULlung 50Gy/5/fx  . Lung cancer 01/21/12    ALLERGIES:  is allergic to asa buff (mag.  MEDICATIONS:  Current Outpatient Prescriptions  Medication Sig Dispense Refill  . acetaminophen (TYLENOL) 500 MG tablet Take 500 mg by mouth every 6 (six) hours as needed for moderate pain (inflammation).    Marland Kitchen allopurinol (ZYLOPRIM) 100 MG tablet Take 100 mg by mouth daily.     Marland Kitchen amLODipine (NORVASC) 10 MG tablet Take 10 mg by mouth daily.      Marland Kitchen aspirin EC 81 MG tablet Take 81 mg by mouth daily.    Marland Kitchen atorvastatin (LIPITOR) 20 MG tablet Take 20 mg by mouth daily.      . cholecalciferol (  VITAMIN D) 1000 UNITS tablet Take 1,000 Units by mouth daily.    Marland Kitchen COLCRYS 0.6 MG tablet Take 0.6 mg by mouth 2 (two) times daily as needed (gout). For gout    . dexamethasone (DECADRON) 4 MG tablet Take 1 tablet by mouth the day before, the day of and the day after chemotherapy 30 tablet 0  . eprosartan-hydrochlorothiazide (TEVETEN HCT) 600-12.5 MG per tablet Take 1 tablet by mouth daily.     . fluticasone (FLONASE) 50 MCG/ACT nasal spray Place 1 spray into both nostrils daily.     . folic acid (FOLVITE) 1 MG tablet Take 1 mg by mouth daily.    Marland Kitchen HYDROcodone-acetaminophen (NORCO/VICODIN) 5-325  MG per tablet Take 1 tablet by mouth every 6 (six) hours as needed for moderate pain. 15 tablet 0  . JANUVIA 100 MG tablet Take 100 mg by mouth daily.     Marland Kitchen losartan-hydrochlorothiazide (HYZAAR) 100-12.5 MG per tablet Take 1 tablet by mouth daily.    Marland Kitchen NEXIUM 40 MG capsule Take 40 mg by mouth daily before breakfast.     . nystatin (MYCOSTATIN) powder Apply topically 4 (four) times daily. 15 g 0  . omeprazole (PRILOSEC) 20 MG capsule Take 20 mg by mouth daily.    Marland Kitchen oxyCODONE-acetaminophen (PERCOCET/ROXICET) 5-325 MG per tablet Take by mouth every 4 (four) hours as needed for severe pain.     No current facility-administered medications for this visit.    SURGICAL HISTORY:  Past Surgical History  Procedure Laterality Date  . Abdominal aortic aneurysm repair  06/28/10    Stent Graft repair    . Exploratory laparotomy      For ectopic pregnancy  . Lumbar disc surgery    . Aortogram   03/03/11    with stenting , left external iliac artery  . Appendectomy    . Navigational bronchoscopy      Right Upper Lobe Mass with Biopsies, Brushings, Washings, and bronchoalveolar Lavage:  . Paratracheal adenopathy and bilateral adrenal nodules      REVIEW OF SYSTEMS:  Constitutional: positive for fatigue Eyes: negative Ears, nose, mouth, throat, and face: negative Respiratory: positive for cough and dyspnea on exertion Cardiovascular: negative Gastrointestinal: negative Genitourinary:negative Integument/breast: negative Hematologic/lymphatic: negative Musculoskeletal:negative Neurological: negative Behavioral/Psych: negative Endocrine: negative Allergic/Immunologic: negative   PHYSICAL EXAMINATION: General appearance: alert, cooperative, fatigued and no distress Head: Normocephalic, without obvious abnormality, atraumatic Neck: no adenopathy, no JVD, supple, symmetrical, trachea midline and thyroid not enlarged, symmetric, no tenderness/mass/nodules Lymph nodes: Cervical, supraclavicular,  and axillary nodes normal. Resp: clear to auscultation bilaterally Back: symmetric, no curvature. ROM normal. No CVA tenderness. Cardio: regular rate and rhythm, S1, S2 normal, no murmur, click, rub or gallop GI: soft, non-tender; bowel sounds normal; no masses,  no organomegaly Extremities: 1+ edema bilaterally Neurologic: Alert and oriented X 3, normal strength and tone. Normal symmetric reflexes. Normal coordination and gait  ECOG PERFORMANCE STATUS: 2 - Symptomatic, <50% confined to bed  Blood pressure 99/36, pulse 163, temperature 98.5 F (36.9 C), temperature source Oral, resp. rate 18, height $RemoveBe'5\' 2"'NRLNDKbXi$  (1.575 m).  LABORATORY DATA: Lab Results  Component Value Date   WBC 3.9 11/21/2014   HGB 11.2* 11/21/2014   HCT 35.0 11/21/2014   MCV 97.8 11/21/2014   PLT 368 11/21/2014      Chemistry      Component Value Date/Time   NA 142 11/21/2014 0933   NA 142 10/01/2014 0522   K 3.8 11/21/2014 0933   K 4.5 10/01/2014 0522  CL 114* 10/01/2014 0522   CO2 18* 11/21/2014 0933   CO2 21 10/01/2014 0522   BUN 12.5 11/21/2014 0933   BUN 30* 10/01/2014 0522   CREATININE 0.8 11/21/2014 0933   CREATININE 1.04 10/01/2014 0522   CREATININE 1.10 10/23/2013 1118      Component Value Date/Time   CALCIUM 8.4 11/21/2014 0933   CALCIUM 9.1 10/01/2014 0522   ALKPHOS 185* 11/21/2014 0933   ALKPHOS 281* 09/28/2014 0008   AST 24 11/21/2014 0933   AST 45* 09/28/2014 0008   ALT 15 11/21/2014 0933   ALT 73* 09/28/2014 0008   BILITOT 0.31 11/21/2014 0933   BILITOT 0.8 09/28/2014 0008       RADIOGRAPHIC STUDIES: Ct Angio Abd/pel W/ And/or W/o  11/20/2014   CLINICAL DATA:  EVAR ? 2011 or 2012, appy, oophorectomy, hx of right lung cancer 2013 with radiation and chemo last treatment 3 weeks ago, prev in pacs, patient broke left arm and is unable to bring arm above head, minimal weight loss, constipation, occ. Diarrhea  EXAM: CT ANGIOGRAPHY ABDOMEN AND PELVIS  TECHNIQUE: Multidetector CT imaging of  the abdomen and pelvis was performed using the standard protocol during bolus administration of intravenous contrast. Multiplanar reconstructed images including MIPs were obtained and reviewed to evaluate the vascular anatomy.  CONTRAST:  75 mL Isovue 370 IV  COMPARISON:  COMPARISON PET-CT 12/08/2013 and earlier studies  FINDINGS: ARTERIAL FINDINGS:  Aorta: Moderate irregular mural thrombus in the visualized distal descending thoracic segment and suprarenal segment of the aorta. No dissection or stenosis. Patent bifurcated infrarenal stent graft ; no endoleak. Native sac diameter 3.7 cm (Previously 4.1 x 3.8 cm).  Celiac axis: Partially calcified ostial plaque resulting in at least mild short segment stenosis, patent distally  Superior mesenteric: Scattered calcified plaque without significant stenosis. Classic distal branch anatomy.  Left renal: Ostial plaque over a length of approximately 1 cm resulting in at least mild stenosis, patent distally.  Right renal: Single, with calcified ostial plaque extending over a length of proximally 16 mm resulting in moderate stenosis. Patent distally.  Inferior mesenteric: Short segment origin stenosis, reconstituted distally by visceral collaterals.  Left iliac: Left limb of the stent graft extends to the distal common iliac. There is moderate plaque throughout the external iliac artery with tandem areas of at least mild stenosis. Patent stent in the mid external iliac artery. No aneurysm. Origin occlusion of the SFA.  Right iliac: Right limb of the stent graft extends to the distal common iliac into and aneurysmal segment measuring 3.1 cm diameter (stable since previous), just proximal to the the common iliac bifurcation. There is moderate plaque through the external iliac without significant stenosis. There is dilatation of the internal iliac up to 19 mm diameter (stable since previous). Origin occlusion of the SFA.  Venous findings: Patent hepatic veins, portal vein,  superior mesenteric vein, splenic vein, bilateral renal veins, IVC. Linear metallic foreign body in a right retroperitoneal branch of the IVC inferior to the level of the right renal vein, stable since scans dating back to 04/15/2001.  Review of the MIP images confirms the above findings.  Nonvascular findings: Linear scarring in the visualized lung bases. Stable hepatic segment 3 cyst. Unremarkable spleen, pancreas. Small bilateral renal cysts. Stable bilateral adrenal adenomas. Stomach, small bowel, and colon are nondilated. Multiple sigmoid diverticula without significant adjacent inflammatory/ edematous change. Urinary bladder decompressed by Foley catheter. Uterus and adnexal regions unremarkable. No ascites. No free air. No adenopathy localized. Advanced degenerative disc  disease L1-L5.  IMPRESSION: 1. Patent infrarenal bifurcated stent graft with no endoleak, decrease in native aneurysm sac diameter to 3.7 cm. 2. Bilateral renal artery ostial stenosis right greater than left. 3. 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 4. 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 5. 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   Electronically Signed   By: Lucrezia Europe M.D.   On: 11/20/2014 13:02   ASSESSMENT AND PLAN: This is a very pleasant 81 years old [place] American female with unresectable stage IIIA non-small cell lung cancer and the patient is not a candidate for radiotherapy. She was treated with systemic chemotherapy with carboplatin and Alimta status post 6 cycles. She is tolerated her treatment fairly well except for the fatigue. She had been on observation. The  CT scan of the chest showed evidence for disease progression with increase in the volume of the right upper lobe lobular mass. The patient was discussed with and also seen by Dr. Julien Nordmann. She will continue systemic chemotherapy with single agent Alimta at 500  mg/m given every 3 weeks, cycle #3  to be given today.  She will follow-up in 3 weeks prior to start of cycle #4 the repeat CBC differential and C met, as well as a restaging CT scan of the chest, abdomen and pelvis with contrast to reevaluate her disease. She is to continue on folic acid,1 mg by mouth daily.She is asked to continue to monitor and report any episodes of hemoptysis. Should this again become more commonplace, we will refer her to Dr. Pablo Ledger for evaluation and possible palliative radiation therapy.   She was advised to call immediately if she has any concerning symptoms in the interval. The patient voices understanding of current disease status and treatment options and is in agreement with the current care plan.  All questions were answered. The patient knows to call the clinic with any problems, questions or concerns. We can certainly see the patient much sooner if necessary.  Carlton Adam, PA-C 11/21/2014  ADDENDUM: Hematology/Oncology Attending: I had a face to face encounter with the patient. I recommended her care plan. This is a very pleasant 81 years old African-American female with recurrent non-small cell lung cancer, adenocarcinoma was currently undergoing systemic chemotherapy with single agent Alimta status post 2 cycles. She is tolerating her treatment fairly well except for mild fatigue. I recommended for the patient to proceed with cycle #3 today as a scheduled. She would come back for follow-up visit in 3 weeks for evaluation after repeating CT scan of the chest. The patient was advised to call immediately if she has any concerning symptoms in the interval.   Disclaimer: This note was dictated with voice recognition software. Similar sounding words can inadvertently be transcribed and may be missed upon review. Eilleen Kempf., MD 11/26/2014

## 2014-11-21 NOTE — Progress Notes (Signed)
Patient has bed sore to L heel. She states Awilda Metro, [place] is aware. Patient advised to inform home care RN for additional management.

## 2014-11-21 NOTE — Patient Instructions (Signed)
[facility] Discharge Instructions for Patients Receiving Chemotherapy  Today you received the following chemotherapy agents Alimta.  To help prevent nausea and vomiting after your treatment, we encourage you to take your nausea medication as prescribed.   If you develop nausea and vomiting that is not controlled by your nausea medication, call the clinic.   BELOW ARE SYMPTOMS THAT SHOULD BE REPORTED IMMEDIATELY:  *FEVER GREATER THAN 100.5 F  *CHILLS WITH OR WITHOUT FEVER  NAUSEA AND VOMITING THAT IS NOT CONTROLLED WITH YOUR NAUSEA MEDICATION  *UNUSUAL SHORTNESS OF BREATH  *UNUSUAL BRUISING OR BLEEDING  TENDERNESS IN MOUTH AND THROAT WITH OR WITHOUT PRESENCE OF ULCERS  *URINARY PROBLEMS  *BOWEL PROBLEMS  UNUSUAL RASH Items with * indicate a potential emergency and should be followed up as soon as possible.  Feel free to call the clinic you have any questions or concerns. The clinic phone number is [phone].  Please show the CHEMO ALERT CARD at check-in to the Emergency Department and triage nurse.   

## 2014-11-22 NOTE — Patient Instructions (Signed)
Continue labs and chemotherapy as scheduled Follow-up in 3 weeks with a restaging CT scan of your chest, abdomen and pelvis to reevaluate your disease 

## 2014-11-26 ENCOUNTER — Encounter: Payer: Self-pay | Admitting: Vascular Surgery

## 2014-11-27 ENCOUNTER — Ambulatory Visit: Payer: Medicare Other | Admitting: Vascular Surgery

## 2014-11-28 ENCOUNTER — Encounter: Payer: Self-pay | Admitting: Skilled Nursing Facility1

## 2014-11-28 NOTE — Progress Notes (Signed)
Subjective:     Patient ID: Beverly Lewis, female   DOB: Jul 25, 1928, 81 y.o.   MRN: [id number]  HPI   Review of Systems     Objective:   Physical Exam To assist the pt in identifying dietary strategies to gain some lost weight back.    Assessment:     Pt identified as being malnourished due to some lost wt. Pt was contacted via the telephone at [phone]. Pt was unavailable.     Plan:     Dietitian left a voicemail prompting the pt to call Ernestene Kiel CSO,RD,LDN at their earliest convenience.

## 2014-11-30 ENCOUNTER — Encounter: Payer: Self-pay | Admitting: Vascular Surgery

## 2014-12-04 ENCOUNTER — Ambulatory Visit (INDEPENDENT_AMBULATORY_CARE_PROVIDER_SITE_OTHER): Payer: Medicare Other | Admitting: Vascular Surgery

## 2014-12-04 ENCOUNTER — Encounter: Payer: Self-pay | Admitting: Vascular Surgery

## 2014-12-04 VITALS — BP 72/52 | HR 113 | Resp 16 | Ht 62.0 in | Wt 210.0 lb

## 2014-12-04 DIAGNOSIS — Z48812 Encounter for surgical aftercare following surgery on the circulatory system: Secondary | ICD-10-CM | POA: Diagnosis not present

## 2014-12-04 DIAGNOSIS — I713 Abdominal aortic aneurysm, ruptured, unspecified: Secondary | ICD-10-CM

## 2014-12-04 NOTE — Progress Notes (Signed)
Subjective:     Patient ID: Beverly Lewis, female   DOB: [birth year], 81 y.o.   MRN: [id number]  HPI this 81 year old female is 3 years post urgent aortic stent graft for leaking abdominal aortic aneurysm. She has done well from a vascular standpoint. She has known severe occlusive disease in her external iliac and superficial femoral arteries bilaterally. Her ABIs are in the range of 0.5-0.6 bilaterally. She also has a non-small cell stage III lung cancer which is being treated by Dr. Inda Merlin with chemotherapy. She is R he had radiation therapy.  Past Medical History  Diagnosis Date  . Hypertension   . Hyperlipidemia   . Cerebrovascular accident history of mild cerebrovascular accident which caused some visual disturbance.   . Diabetes mellitus   . Morbid obesity   . Lung mass     Right upper Lobe  . Shortness of breath   . Pneumonia     hx  . Peripheral vascular disease     hx blood clot 30 yrs ago leg  . GERD (gastroesophageal reflux disease)   . Arthritis   . Pneumothorax, iatrogenic 8/2/113-discharge note    Post Biospy  . Cough with hemoptysis 01/22/12    Prior to discharge  . Tachycardia 01/22/12    Prior to discharge  . History of radiation therapy 02/18/12,02/23/12,02/25/12., 03/01/12,&03/03/12    RULlung 50Gy/5/fx  . Lung cancer 01/21/12  . AAA (abdominal aortic aneurysm)     History  Substance Use Topics  . Smoking status: Current Every Day Smoker -- 0.50 packs/day for 65 years    Types: Cigarettes  . Smokeless tobacco: Never Used  . Alcohol Use: No     Comment: She does not consume alcohol on a regular basis.    Family History  Problem Relation Age of Onset  . Heart disease Mother   . Hypertension Mother   . Diabetes Mother   . Hyperlipidemia Mother   . Heart disease Father   . Hypertension Father   . Diabetes Father   . Hyperlipidemia Father   . Diabetes Daughter     Allergies  Allergen Reactions  . Asa Buff (Mag [Buffered Aspirin]     In tolerance in  higher dosages.     Current outpatient prescriptions:  .  acetaminophen (TYLENOL) 500 MG tablet, Take 500 mg by mouth every 6 (six) hours as needed for moderate pain (inflammation)., Disp: , Rfl:  .  allopurinol (ZYLOPRIM) 100 MG tablet, Take 100 mg by mouth daily. , Disp: , Rfl:  .  amLODipine (NORVASC) 10 MG tablet, Take 10 mg by mouth daily.  , Disp: , Rfl:  .  aspirin EC 81 MG tablet, Take 81 mg by mouth daily., Disp: , Rfl:  .  atorvastatin (LIPITOR) 20 MG tablet, Take 20 mg by mouth daily.  , Disp: , Rfl:  .  cholecalciferol (VITAMIN D) 1000 UNITS tablet, Take 1,000 Units by mouth daily., Disp: , Rfl:  .  COLCRYS 0.6 MG tablet, Take 0.6 mg by mouth 2 (two) times daily as needed (gout). For gout, Disp: , Rfl:  .  dexamethasone (DECADRON) 4 MG tablet, Take 1 tablet by mouth the day before, the day of and the day after chemotherapy, Disp: 30 tablet, Rfl: 0 .  eprosartan-hydrochlorothiazide (TEVETEN HCT) 600-12.5 MG per tablet, Take 1 tablet by mouth daily. , Disp: , Rfl:  .  fluticasone (FLONASE) 50 MCG/ACT nasal spray, Place 1 spray into both nostrils daily. , Disp: , Rfl:  .  folic acid (FOLVITE) 1 MG tablet, Take 1 mg by mouth daily., Disp: , Rfl:  .  HYDROcodone-acetaminophen (NORCO/VICODIN) 5-325 MG per tablet, Take 1 tablet by mouth every 6 (six) hours as needed for moderate pain., Disp: 15 tablet, Rfl: 0 .  JANUVIA 100 MG tablet, Take 100 mg by mouth daily. , Disp: , Rfl:  .  losartan-hydrochlorothiazide (HYZAAR) 100-12.5 MG per tablet, Take 1 tablet by mouth daily., Disp: , Rfl:  .  NEXIUM 40 MG capsule, Take 40 mg by mouth daily before breakfast. , Disp: , Rfl:  .  nystatin (MYCOSTATIN) powder, Apply topically 4 (four) times daily., Disp: 15 g, Rfl: 0 .  omeprazole (PRILOSEC) 20 MG capsule, Take 20 mg by mouth daily., Disp: , Rfl:  .  oxyCODONE-acetaminophen (PERCOCET/ROXICET) 5-325 MG per tablet, Take by mouth every 4 (four) hours as needed for severe pain., Disp: , Rfl:   Filed  Vitals:   12/04/14 1515  BP: 72/52  Pulse: 113  Resp: 16  Height: '5\' 2"'$  (1.575 m)  Weight: 210 lb (95.255 kg)    Body mass index is 38.4 kg/(m^2).           Review of Systems denies chest pain, palpitations, dyspnea on exertion, orthopnea. He recently had an ulcer which was a pressure sore on the left heel which has been improving.     Objective:   Physical Exam BP 72/52 mmHg  Pulse 113  Resp 16  Ht '5\' 2"'$  (1.575 m)  Wt 210 lb (95.255 kg)  BMI 38.40 kg/m2  Gen.-alert and oriented x3 in no apparent distress-in a wheelchair-elderly and frail  HEENT normal for age Lungs no rhonchi or wheezing Cardiovascular regular rhythm no murmurs carotid pulses 3+ palpable no bruits audible Abdomen soft nontender no palpable masses Musculoskeletal free of  major deformities Skin clear -no rashes Neurologic normal Lower extremities 3+ femoral pulses palpable bilaterally with 1+ edema bilaterally Left heel has a 2 cm area which previously was blistered but appears to be on the healing curve at the present time with no evidence of infection or full thickness skin loss.  Today I reviewed the CT angiogram of the abdomen and pelvis which was performed a few weeks ago. This revealed continued shrinkage of the aneurysm sac to 3.7 cm in maximum diameter. There is a stent in the left external iliac artery and a small right common iliac artery aneurysm. No evidence of endoleak is noted.       Assessment:     Doing well 3 years post urgent aortoiliac stent graft for leaking abdominal aortic aneurysm Stage III non-small cell lung cancer    Plan:     Patient to return in 1 year with continued follow-up of aneurysm stent graft with CT angiogram

## 2014-12-05 NOTE — Addendum Note (Signed)
Addended by: Dorthula Rue L on: 12/05/2014 11:26 AM   Modules accepted: Orders

## 2014-12-10 ENCOUNTER — Ambulatory Visit (HOSPITAL_COMMUNITY)
Admission: RE | Admit: 2014-12-10 | Discharge: 2014-12-10 | Disposition: A | Discharge status: Home / Self Care | Payer: Medicare Other | Source: Ambulatory Visit | Attending: Physician Assistant | Admitting: Physician Assistant

## 2014-12-10 DIAGNOSIS — C3490 Malignant neoplasm of unspecified part of unspecified bronchus or lung: Secondary | ICD-10-CM

## 2014-12-12 ENCOUNTER — Ambulatory Visit (HOSPITAL_BASED_OUTPATIENT_CLINIC_OR_DEPARTMENT_OTHER): Payer: Medicare Other

## 2014-12-12 ENCOUNTER — Ambulatory Visit: Payer: Medicare Other

## 2014-12-12 ENCOUNTER — Other Ambulatory Visit (HOSPITAL_BASED_OUTPATIENT_CLINIC_OR_DEPARTMENT_OTHER): Payer: Medicare Other

## 2014-12-12 ENCOUNTER — Encounter: Payer: Self-pay | Admitting: Physician Assistant

## 2014-12-12 ENCOUNTER — Telehealth: Payer: Self-pay | Admitting: Internal Medicine

## 2014-12-12 ENCOUNTER — Ambulatory Visit (HOSPITAL_BASED_OUTPATIENT_CLINIC_OR_DEPARTMENT_OTHER): Payer: Medicare Other | Admitting: Physician Assistant

## 2014-12-12 VITALS — BP 97/54 | HR 88 | Temp 98.4°F | Resp 19 | Ht 62.0 in | Wt 206.6 lb

## 2014-12-12 DIAGNOSIS — C3490 Malignant neoplasm of unspecified part of unspecified bronchus or lung: Secondary | ICD-10-CM

## 2014-12-12 DIAGNOSIS — C3491 Malignant neoplasm of unspecified part of right bronchus or lung: Secondary | ICD-10-CM

## 2014-12-12 DIAGNOSIS — C3412 Malignant neoplasm of upper lobe, left bronchus or lung: Secondary | ICD-10-CM

## 2014-12-12 DIAGNOSIS — C3411 Malignant neoplasm of upper lobe, right bronchus or lung: Secondary | ICD-10-CM | POA: Diagnosis not present

## 2014-12-12 DIAGNOSIS — Z5111 Encounter for antineoplastic chemotherapy: Secondary | ICD-10-CM

## 2014-12-12 LAB — CBC WITH DIFFERENTIAL/PLATELET
BASO%: 0.5 % (ref 0.0–2.0)
Basophils Absolute: 0 10*3/uL (ref 0.0–0.1)
EOS ABS: 0.1 10*3/uL (ref 0.0–0.5)
EOS%: 1.7 % (ref 0.0–7.0)
HEMATOCRIT: 32.4 % — AB (ref 34.8–46.6)
HEMOGLOBIN: 10.4 g/dL — AB (ref 11.6–15.9)
LYMPH%: 21.2 % (ref 14.0–49.7)
MCH: 32 pg (ref 25.1–34.0)
MCHC: 32.1 g/dL (ref 31.5–36.0)
MCV: 99.7 fL (ref 79.5–101.0)
MONO#: 1.2 10*3/uL — ABNORMAL HIGH (ref 0.1–0.9)
MONO%: 15.2 % — ABNORMAL HIGH (ref 0.0–14.0)
NEUT#: 4.6 10*3/uL (ref 1.5–6.5)
NEUT%: 61.4 % (ref 38.4–76.8)
Platelets: 345 10*3/uL (ref 145–400)
RBC: 3.25 10*6/uL — ABNORMAL LOW (ref 3.70–5.45)
RDW: 21.2 % — AB (ref 11.2–14.5)
WBC: 7.6 10*3/uL (ref 3.9–10.3)
lymph#: 1.6 10*3/uL (ref 0.9–3.3)

## 2014-12-12 LAB — COMPREHENSIVE METABOLIC PANEL (CC13)
ALK PHOS: 232 U/L — AB (ref 40–150)
ALT: 32 U/L (ref 0–55)
AST: 42 U/L — AB (ref 5–34)
Albumin: 2.6 g/dL — ABNORMAL LOW (ref 3.5–5.0)
Anion Gap: 10 mEq/L (ref 3–11)
BILIRUBIN TOTAL: 0.6 mg/dL (ref 0.20–1.20)
BUN: 29.4 mg/dL — AB (ref 7.0–26.0)
CALCIUM: 8.9 mg/dL (ref 8.4–10.4)
CHLORIDE: 108 meq/L (ref 98–109)
CO2: 25 mEq/L (ref 22–29)
CREATININE: 1.6 mg/dL — AB (ref 0.6–1.1)
EGFR: 34 mL/min/{1.73_m2} — ABNORMAL LOW (ref >90)
Glucose: 89 mg/dl (ref 70–140)
Potassium: 3.6 mEq/L (ref 3.5–5.1)
Sodium: 144 mEq/L (ref 136–145)
Total Protein: 6.8 g/dL (ref 6.4–8.3)

## 2014-12-12 MED ORDER — SODIUM CHLORIDE 0.9 % IV SOLN
385.0000 mg/m2 | Freq: Once | INTRAVENOUS | Status: AC
  Administered 2014-12-12: 800 mg via INTRAVENOUS
  Filled 2014-12-12: qty 32

## 2014-12-12 MED ORDER — SODIUM CHLORIDE 0.9 % IV SOLN
Freq: Once | INTRAVENOUS | Status: AC
  Administered 2014-12-12: 11:00:00 via INTRAVENOUS

## 2014-12-12 MED ORDER — SODIUM CHLORIDE 0.9 % IV SOLN
Freq: Once | INTRAVENOUS | Status: AC
  Administered 2014-12-12: 11:00:00 via INTRAVENOUS
  Filled 2014-12-12: qty 4

## 2014-12-12 NOTE — Progress Notes (Addendum)
[facility] [phone]   [phone]  OFFICE PROGRESS NOTE  [name], MD [place] [address]  DIAGNOSIS: Lung cancer  Primary site: Lung (Right)  Staging method: AJCC 7th Edition  Clinical: Stage IIIA (T3, N2, M0) signed by Curt Bears, MD on 01/01/2014 6:57 PM  Summary: Stage IIIA (T3, N2, M0)   PRIOR THERAPY: Systemic chemotherapy with carboplatin for an AUC of 4 and Alimta 400 mg/m2 given in 3 weeks. Status post 6 cycles, last dose was given 05/07/2014 with partial response.   CURRENT THERAPY: Systemic chemotherapy with single agent Alimta 400 MG/M2 every C weeks. First dose given 09/17/2014. Status post 3 cycles  DISEASE STAGE:  Lung cancer  Primary site: Lung (Right)  Staging method: AJCC 7th Edition  Clinical: Stage IIIA (T3, N2, M0) signed by Curt Bears, MD on 01/01/2014 6:57 PM  Summary: Stage IIIA (T3, N2, M0)  CHEMOTHERAPY INTENT: palliative  CURRENT # OF CHEMOTHERAPY CYCLES: 4 CURRENT ANTIEMETICS: compazine  CURRENT SMOKING STATUS: current smoker  ORAL CHEMOTHERAPY AND CONSENT: n/a  CURRENT BISPHOSPHONATES USE: none  PAIN MANAGEMENT: hydrocodone  NARCOTICS INDUCED CONSTIPATION: none  LIVING WILL AND CODE STATUS:   INTERVAL HISTORY: Beverly Lewis 81 y.o. female returns to the clinic today for followup visit accompanied by a family member. She is now status post 3 cycles of single agent Alimta. She recently had a restaging CT scan of the chest and presents to discuss the results. Overall she's tolerating her treatment with single agent Alimta relatively well.  She presents to proceed with cycle #4 of her systemic chemotherapy with single agent Alimta. She is feeling fine today with no specific complaints.  She denied having any nausea or vomiting. She has no fever or chills. She denied having any weight loss or night sweats. She has no chest pain but continues to have shortness of breath at  baseline and increased with exertion with no cough or hemoptysis.   MEDICAL HISTORY: Past Medical History  Diagnosis Date  . Hypertension   . Hyperlipidemia   . Cerebrovascular accident history of mild cerebrovascular accident which caused some visual disturbance.   . Diabetes mellitus   . Morbid obesity   . Lung mass     Right upper Lobe  . Shortness of breath   . Pneumonia     hx  . Peripheral vascular disease     hx blood clot 30 yrs ago leg  . GERD (gastroesophageal reflux disease)   . Arthritis   . Pneumothorax, iatrogenic 8/2/113-discharge note    Post Biospy  . Cough with hemoptysis 01/22/12    Prior to discharge  . Tachycardia 01/22/12    Prior to discharge  . History of radiation therapy 02/18/12,02/23/12,02/25/12., 03/01/12,&03/03/12    RULlung 50Gy/5/fx  . Lung cancer 01/21/12  . AAA (abdominal aortic aneurysm)     ALLERGIES:  is allergic to asa buff (mag.  MEDICATIONS:  Current Outpatient Prescriptions  Medication Sig Dispense Refill  . acetaminophen (TYLENOL) 500 MG tablet Take 500 mg by mouth every 6 (six) hours as needed for moderate pain (inflammation).    Marland Kitchen allopurinol (ZYLOPRIM) 100 MG tablet Take 100 mg by mouth daily.     Marland Kitchen amLODipine (NORVASC) 10 MG tablet Take 10 mg by mouth daily.      Marland Kitchen aspirin EC 81 MG tablet Take 81 mg by mouth daily.    Marland Kitchen atorvastatin (LIPITOR) 20 MG tablet Take 20 mg by mouth  daily.      . cholecalciferol (VITAMIN D) 1000 UNITS tablet Take 1,000 Units by mouth daily.    Marland Kitchen COLCRYS 0.6 MG tablet Take 0.6 mg by mouth 2 (two) times daily as needed (gout). For gout    . dexamethasone (DECADRON) 4 MG tablet Take 1 tablet by mouth the day before, the day of and the day after chemotherapy 30 tablet 0  . eprosartan-hydrochlorothiazide (TEVETEN HCT) 600-12.5 MG per tablet Take 1 tablet by mouth daily.     . fluticasone (FLONASE) 50 MCG/ACT nasal spray Place 1 spray into both nostrils daily.     . folic acid (FOLVITE) 1 MG tablet Take 1 mg by mouth  daily.    Marland Kitchen HYDROcodone-acetaminophen (NORCO/VICODIN) 5-325 MG per tablet Take 1 tablet by mouth every 6 (six) hours as needed for moderate pain. 15 tablet 0  . JANUVIA 100 MG tablet Take 100 mg by mouth daily.     Marland Kitchen losartan-hydrochlorothiazide (HYZAAR) 100-12.5 MG per tablet Take 1 tablet by mouth daily.    Marland Kitchen NEXIUM 40 MG capsule Take 40 mg by mouth daily before breakfast.     . nystatin (MYCOSTATIN) powder Apply topically 4 (four) times daily. 15 g 0  . omeprazole (PRILOSEC) 20 MG capsule Take 20 mg by mouth daily.    Marland Kitchen oxyCODONE-acetaminophen (PERCOCET/ROXICET) 5-325 MG per tablet Take by mouth every 4 (four) hours as needed for severe pain.     No current facility-administered medications for this visit.    SURGICAL HISTORY:  Past Surgical History  Procedure Laterality Date  . Abdominal aortic aneurysm repair  06/28/10    Stent Graft repair    . Exploratory laparotomy      For ectopic pregnancy  . Lumbar disc surgery    . Aortogram   03/03/11    with stenting , left external iliac artery  . Appendectomy    . Navigational bronchoscopy      Right Upper Lobe Mass with Biopsies, Brushings, Washings, and bronchoalveolar Lavage:  . Paratracheal adenopathy and bilateral adrenal nodules      REVIEW OF SYSTEMS:  Constitutional: positive for fatigue Eyes: negative Ears, nose, mouth, throat, and face: negative Respiratory: positive for cough and dyspnea on exertion Cardiovascular: negative Gastrointestinal: negative Genitourinary:negative Integument/breast: negative Hematologic/lymphatic: negative Musculoskeletal:negative Neurological: negative Behavioral/Psych: negative Endocrine: negative Allergic/Immunologic: negative   PHYSICAL EXAMINATION: General appearance: alert, cooperative, fatigued and no distress Head: Normocephalic, without obvious abnormality, atraumatic Neck: no adenopathy, no JVD, supple, symmetrical, trachea midline and thyroid not enlarged, symmetric, no  tenderness/mass/nodules Lymph nodes: Cervical, supraclavicular, and axillary nodes normal. Resp: clear to auscultation bilaterally Back: symmetric, no curvature. ROM normal. No CVA tenderness. Cardio: regular rate and rhythm, S1, S2 normal, no murmur, click, rub or gallop GI: soft, non-tender; bowel sounds normal; no masses,  no organomegaly Extremities: 1+ edema bilaterally Neurologic: Alert and oriented X 3, normal strength and tone. Normal symmetric reflexes. Normal coordination and gait  ECOG PERFORMANCE STATUS: 2 - Symptomatic, <50% confined to bed  Blood pressure 97/54, pulse 88, temperature 98.4 F (36.9 C), temperature source Oral, resp. rate 19, height $RemoveBe'5\' 2"'zaYdssSts$  (1.575 m), weight 206 lb 9.6 oz (93.713 kg), SpO2 97 %.  LABORATORY DATA: Lab Results  Component Value Date   WBC 7.6 12/12/2014   HGB 10.4* 12/12/2014   HCT 32.4* 12/12/2014   MCV 99.7 12/12/2014   PLT 345 12/12/2014      Chemistry      Component Value Date/Time   NA 144 12/12/2014 0934  NA 142 10/01/2014 0522   K 3.6 12/12/2014 0934   K 4.5 10/01/2014 0522   CL 114* 10/01/2014 0522   CO2 25 12/12/2014 0934   CO2 21 10/01/2014 0522   BUN 29.4* 12/12/2014 0934   BUN 30* 10/01/2014 0522   CREATININE 1.6* 12/12/2014 0934   CREATININE 1.04 10/01/2014 0522   CREATININE 1.10 10/23/2013 1118      Component Value Date/Time   CALCIUM 8.9 12/12/2014 0934   CALCIUM 9.1 10/01/2014 0522   ALKPHOS 232* 12/12/2014 0934   ALKPHOS 281* 09/28/2014 0008   AST 42* 12/12/2014 0934   AST 45* 09/28/2014 0008   ALT 32 12/12/2014 0934   ALT 73* 09/28/2014 0008   BILITOT 0.60 12/12/2014 0934   BILITOT 0.8 09/28/2014 0008       RADIOGRAPHIC STUDIES: Ct Chest Wo Contrast  12/10/2014   CLINICAL DATA:  Restaging non small cell lung cancer.  EXAM: CT CHEST WITHOUT CONTRAST  TECHNIQUE: Multidetector CT imaging of the chest was performed following the standard protocol without IV contrast.  COMPARISON:  Chest CT 09/03/2014   FINDINGS: Chest wall: No breast masses are identified. There is persistent soft tissue thickening involving the right chest wall adjacent to an abnormal anterior rib. Findings most likely due to radiation change. Small scattered axillary and subpectoral lymph nodes but no mass or adenopathy. The bony thorax is otherwise unremarkable. Stable degenerative changes involving the thoracic spine. No destructive bone lesions.  Mediastinum: The heart is normal in size. No pericardial effusion. Stable advanced atherosclerotic calcifications involving the aorta and branch vessels including the coronary arteries. The esophagus is grossly normal. Stable small hiatal hernia. Small mediastinal lymph nodes are stable. The 6 mm upper mediastinal node on image number 13 measures 6 mm short axis and is unchanged. The precarinal node on image number 21 measures 10 mm short axis and is unchanged. No new mediastinal or hilar lymph nodes.  Lungs/ pleura: The large right upper lobe lung mass is relatively stable. It measures approximately 8 x 3.1 cm and previously measured 7.1 x 3.1 cm. It is likely a combination of tumor and radiation fibrosis. No CT findings for metastatic pulmonary disease. No acute pulmonary findings. No pleural effusion. Stable emphysematous changes.  Upper abdomen: Stable bilateral adrenal gland adenomas and benign left hepatic lobe cyst.  IMPRESSION: 1. Overall stable CT appearance of these large complex right upper lobe lung mass, likely a combination of tumor and radiation change. 2. Stable mediastinal lymph nodes. 3. No CT findings for metastatic pulmonary disease. 4. Stable soft tissue thickening involving the anterior chest wall at the same level of the tumor and an abnormal anterior rib. This all likely radiation change. 5. Stable adrenal gland adenomas.   Electronically Signed   By: Marijo Sanes M.D.   On: 12/10/2014 09:00   Ct Angio Abd/pel W/ And/or W/o  11/20/2014   CLINICAL DATA:  EVAR ? 2011 or  2012, appy, oophorectomy, hx of right lung cancer 2013 with radiation and chemo last treatment 3 weeks ago, prev in pacs, patient broke left arm and is unable to bring arm above head, minimal weight loss, constipation, occ. Diarrhea  EXAM: CT ANGIOGRAPHY ABDOMEN AND PELVIS  TECHNIQUE: Multidetector CT imaging of the abdomen and pelvis was performed using the standard protocol during bolus administration of intravenous contrast. Multiplanar reconstructed images including MIPs were obtained and reviewed to evaluate the vascular anatomy.  CONTRAST:  75 mL Isovue 370 IV  COMPARISON:  COMPARISON PET-CT 12/08/2013 and earlier  studies  FINDINGS: ARTERIAL FINDINGS:  Aorta: Moderate irregular mural thrombus in the visualized distal descending thoracic segment and suprarenal segment of the aorta. No dissection or stenosis. Patent bifurcated infrarenal stent graft ; no endoleak. Native sac diameter 3.7 cm (Previously 4.1 x 3.8 cm).  Celiac axis: Partially calcified ostial plaque resulting in at least mild short segment stenosis, patent distally  Superior mesenteric: Scattered calcified plaque without significant stenosis. Classic distal branch anatomy.  Left renal: Ostial plaque over a length of approximately 1 cm resulting in at least mild stenosis, patent distally.  Right renal: Single, with calcified ostial plaque extending over a length of proximally 16 mm resulting in moderate stenosis. Patent distally.  Inferior mesenteric: Short segment origin stenosis, reconstituted distally by visceral collaterals.  Left iliac: Left limb of the stent graft extends to the distal common iliac. There is moderate plaque throughout the external iliac artery with tandem areas of at least mild stenosis. Patent stent in the mid external iliac artery. No aneurysm. Origin occlusion of the SFA.  Right iliac: Right limb of the stent graft extends to the distal common iliac into and aneurysmal segment measuring 3.1 cm diameter (stable since  previous), just proximal to the the common iliac bifurcation. There is moderate plaque through the external iliac without significant stenosis. There is dilatation of the internal iliac up to 19 mm diameter (stable since previous). Origin occlusion of the SFA.  Venous findings: Patent hepatic veins, portal vein, superior mesenteric vein, splenic vein, bilateral renal veins, IVC. Linear metallic foreign body in a right retroperitoneal branch of the IVC inferior to the level of the right renal vein, stable since scans dating back to 04/15/2001.  Review of the MIP images confirms the above findings.  Nonvascular findings: Linear scarring in the visualized lung bases. Stable hepatic segment 3 cyst. Unremarkable spleen, pancreas. Small bilateral renal cysts. Stable bilateral adrenal adenomas. Stomach, small bowel, and colon are nondilated. Multiple sigmoid diverticula without significant adjacent inflammatory/ edematous change. Urinary bladder decompressed by Foley catheter. Uterus and adnexal regions unremarkable. No ascites. No free air. No adenopathy localized. Advanced degenerative disc disease L1-L5.  IMPRESSION: 1. Patent infrarenal bifurcated stent graft with no endoleak, decrease in native aneurysm sac diameter to 3.7 cm. 2. Bilateral renal artery ostial stenosis right greater than left. 3. 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 4. 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 5. 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   Electronically Signed   By: Lucrezia Europe M.D.   On: 11/20/2014 13:02   ASSESSMENT AND PLAN: This is a very pleasant 81 years old [place] American female with unresectable stage IIIA non-small cell lung cancer and the patient is not a candidate for radiotherapy. She was treated with systemic chemotherapy with carboplatin and Alimta status post 6 cycles. She is tolerated her treatment fairly well except  for the fatigue. She had been on observation. The  CT scan of the chest showed evidence for disease progression with increase in the volume of the right upper lobe lobular mass. Now being treated with single agent Alimta at 500 mg/m given every 3 weeks. Status post 3 cycles. The patient was discussed with and also seen by Dr. Julien Nordmann. Her recent restaging CT scan revealed stable disease. She will continue with single agent Alimta cycle #4 today as scheduled.  She will follow-up in 3 weeks prior to start of cycle #5 with repeat CBC differential and C  met.  She is to continue on folic acid,1 mg by mouth daily.  She was advised to call immediately if she has any concerning symptoms in the interval. The patient voices understanding of current disease status and treatment options and is in agreement with the current care plan.  All questions were answered. The patient knows to call the clinic with any problems, questions or concerns. We can certainly see the patient much sooner if necessary.  Carlton Adam, PA-C 12/12/2014  ADDENDUM: Hematology/Oncology Attending: I had a face to face encounter with the patient. I recommended her care plan. This is a very pleasant 81 years old African-American female with progressive non-small cell lung cancer, adenocarcinoma was currently undergoing systemic chemotherapy with single agent Alimta status post 3 cycles. The patient is tolerating her treatment fairly well with no significant adverse effect except for mild fatigue. Her recent CT scan of the chest showed stable disease with no significant progression. I discussed the scan results with the patient and her family. I recommended for her to continue with her current maintenance chemotherapy with Alimta as a scheduled. She will receive cycle #4 today. She would come back for follow-up visit in 3 weeks for reevaluation with the start of cycle #5.  The patient was advised to call immediately if she has any  concerning symptoms in the interval.  Disclaimer: This note was dictated with voice recognition software. Similar sounding words can inadvertently be transcribed and may be missed upon review. Eilleen Kempf., MD 12/18/2014

## 2014-12-12 NOTE — Patient Instructions (Signed)
[facility] Discharge Instructions for Patients Receiving Chemotherapy  Today you received the following chemotherapy agents Alimta  To help prevent nausea and vomiting after your treatment, we encourage you to take your nausea medication as needed   If you develop nausea and vomiting that is not controlled by your nausea medication, call the clinic.   BELOW ARE SYMPTOMS THAT SHOULD BE REPORTED IMMEDIATELY:  *FEVER GREATER THAN 100.5 F  *CHILLS WITH OR WITHOUT FEVER  NAUSEA AND VOMITING THAT IS NOT CONTROLLED WITH YOUR NAUSEA MEDICATION  *UNUSUAL SHORTNESS OF BREATH  *UNUSUAL BRUISING OR BLEEDING  TENDERNESS IN MOUTH AND THROAT WITH OR WITHOUT PRESENCE OF ULCERS  *URINARY PROBLEMS  *BOWEL PROBLEMS  UNUSUAL RASH Items with * indicate a potential emergency and should be followed up as soon as possible.  Feel free to call the clinic you have any questions or concerns. The clinic phone number is [phone].  Please show the CHEMO ALERT CARD at check-in to the Emergency Department and triage nurse.   

## 2014-12-12 NOTE — Telephone Encounter (Signed)
Gave and printeda ppt sched and avs for pt for July and AUG

## 2014-12-16 NOTE — Patient Instructions (Signed)
Your recent restaging CT scan of the chest revealed stable disease Continue labs and chemotherapy as scheduled Follow-up in 3 weeks, prior to next scheduled cycle of chemotherapy

## 2014-12-18 DIAGNOSIS — Z5111 Encounter for antineoplastic chemotherapy: Secondary | ICD-10-CM | POA: Insufficient documentation

## 2015-01-02 ENCOUNTER — Ambulatory Visit (HOSPITAL_BASED_OUTPATIENT_CLINIC_OR_DEPARTMENT_OTHER): Payer: Medicare Other

## 2015-01-02 ENCOUNTER — Other Ambulatory Visit (HOSPITAL_BASED_OUTPATIENT_CLINIC_OR_DEPARTMENT_OTHER): Payer: Medicare Other

## 2015-01-02 ENCOUNTER — Telehealth: Payer: Self-pay | Admitting: Physician Assistant

## 2015-01-02 ENCOUNTER — Encounter: Payer: Self-pay | Admitting: Physician Assistant

## 2015-01-02 ENCOUNTER — Ambulatory Visit (HOSPITAL_BASED_OUTPATIENT_CLINIC_OR_DEPARTMENT_OTHER): Payer: Medicare Other | Admitting: Physician Assistant

## 2015-01-02 ENCOUNTER — Ambulatory Visit: Payer: Medicare Other

## 2015-01-02 VITALS — BP 98/54 | HR 92 | Temp 98.1°F | Resp 18

## 2015-01-02 DIAGNOSIS — E876 Hypokalemia: Secondary | ICD-10-CM | POA: Diagnosis not present

## 2015-01-02 DIAGNOSIS — Z5111 Encounter for antineoplastic chemotherapy: Secondary | ICD-10-CM

## 2015-01-02 DIAGNOSIS — C3411 Malignant neoplasm of upper lobe, right bronchus or lung: Secondary | ICD-10-CM

## 2015-01-02 DIAGNOSIS — C3490 Malignant neoplasm of unspecified part of unspecified bronchus or lung: Secondary | ICD-10-CM

## 2015-01-02 DIAGNOSIS — C3412 Malignant neoplasm of upper lobe, left bronchus or lung: Secondary | ICD-10-CM

## 2015-01-02 LAB — COMPREHENSIVE METABOLIC PANEL (CC13)
ALBUMIN: 2.6 g/dL — AB (ref 3.5–5.0)
ALK PHOS: 203 U/L — AB (ref 40–150)
ALT: 15 U/L (ref 0–55)
AST: 20 U/L (ref 5–34)
Anion Gap: 10 mEq/L (ref 3–11)
BILIRUBIN TOTAL: 0.34 mg/dL (ref 0.20–1.20)
BUN: 30.1 mg/dL — AB (ref 7.0–26.0)
CHLORIDE: 111 meq/L — AB (ref 98–109)
CO2: 24 mEq/L (ref 22–29)
Calcium: 9.2 mg/dL (ref 8.4–10.4)
Creatinine: 1.1 mg/dL (ref 0.6–1.1)
EGFR: 56 mL/min/{1.73_m2} — ABNORMAL LOW (ref >90)
GLUCOSE: 87 mg/dL (ref 70–140)
POTASSIUM: 3 meq/L — AB (ref 3.5–5.1)
Sodium: 146 mEq/L — ABNORMAL HIGH (ref 136–145)
TOTAL PROTEIN: 7.3 g/dL (ref 6.4–8.3)

## 2015-01-02 LAB — CBC WITH DIFFERENTIAL/PLATELET
BASO%: 0.2 % (ref 0.0–2.0)
Basophils Absolute: 0 10*3/uL (ref 0.0–0.1)
EOS%: 1.8 % (ref 0.0–7.0)
Eosinophils Absolute: 0.1 10*3/uL (ref 0.0–0.5)
HEMATOCRIT: 33.1 % — AB (ref 34.8–46.6)
HEMOGLOBIN: 10.7 g/dL — AB (ref 11.6–15.9)
LYMPH%: 27.2 % (ref 14.0–49.7)
MCH: 33.4 pg (ref 25.1–34.0)
MCHC: 32.4 g/dL (ref 31.5–36.0)
MCV: 103.1 fL — ABNORMAL HIGH (ref 79.5–101.0)
MONO#: 0.9 10*3/uL (ref 0.1–0.9)
MONO%: 19.2 % — AB (ref 0.0–14.0)
NEUT#: 2.5 10*3/uL (ref 1.5–6.5)
NEUT%: 51.6 % (ref 38.4–76.8)
Platelets: 329 10*3/uL (ref 145–400)
RBC: 3.21 10*6/uL — AB (ref 3.70–5.45)
RDW: 23.6 % — AB (ref 11.2–14.5)
WBC: 4.8 10*3/uL (ref 3.9–10.3)
lymph#: 1.3 10*3/uL (ref 0.9–3.3)

## 2015-01-02 MED ORDER — SODIUM CHLORIDE 0.9 % IV SOLN
385.0000 mg/m2 | Freq: Once | INTRAVENOUS | Status: AC
  Administered 2015-01-02: 800 mg via INTRAVENOUS
  Filled 2015-01-02: qty 32

## 2015-01-02 MED ORDER — SODIUM CHLORIDE 0.9 % IV SOLN
Freq: Once | INTRAVENOUS | Status: AC
  Administered 2015-01-02: 11:00:00 via INTRAVENOUS

## 2015-01-02 MED ORDER — POTASSIUM CHLORIDE CRYS ER 20 MEQ PO TBCR: 20.0000 meq | EXTENDED_RELEASE_TABLET | Freq: Every day | ORAL | Status: DC

## 2015-01-02 MED ORDER — SODIUM CHLORIDE 0.9 % IV SOLN
Freq: Once | INTRAVENOUS | Status: AC
  Administered 2015-01-02: 12:00:00 via INTRAVENOUS
  Filled 2015-01-02: qty 4

## 2015-01-02 NOTE — Patient Instructions (Signed)
Follow up with your diabetes doctor regarding your diabetic neuropathy affecting your feet Take your folic acid and dexamethasone as prescribed Follow up in 3 weeks prior to your next scheduled cycle of chemotherapy

## 2015-01-02 NOTE — Progress Notes (Addendum)
[facility] [phone]   [phone]  OFFICE PROGRESS NOTE  [name], MD [place] [address]  DIAGNOSIS: Lung cancer  Primary site: Lung (Right)  Staging method: AJCC 7th Edition  Clinical: Stage IIIA (T3, N2, M0) signed by Curt Bears, MD on 01/01/2014 6:57 PM  Summary: Stage IIIA (T3, N2, M0)   PRIOR THERAPY: Systemic chemotherapy with carboplatin for an AUC of 4 and Alimta 400 mg/m2 given in 3 weeks. Status post 6 cycles, last dose was given 05/07/2014 with partial response.   CURRENT THERAPY: Systemic chemotherapy with single agent Alimta 400 MG/M2 every C weeks. First dose given 09/17/2014. Status post 4 cycles  DISEASE STAGE:  Lung cancer  Primary site: Lung (Right)  Staging method: AJCC 7th Edition  Clinical: Stage IIIA (T3, N2, M0) signed by Curt Bears, MD on 01/01/2014 6:57 PM  Summary: Stage IIIA (T3, N2, M0)  CHEMOTHERAPY INTENT: palliative  CURRENT # OF CHEMOTHERAPY CYCLES: 5 CURRENT ANTIEMETICS: compazine  CURRENT SMOKING STATUS: current smoker  ORAL CHEMOTHERAPY AND CONSENT: n/a  CURRENT BISPHOSPHONATES USE: none  PAIN MANAGEMENT: hydrocodone  NARCOTICS INDUCED CONSTIPATION: none  LIVING WILL AND CODE STATUS:   INTERVAL HISTORY: Beverly Lewis 81 y.o. female returns to the clinic today for followup visit accompanied by a family member. She is now status post 4 cycles of single agent Alimta. She is tolerating her course of single agent Alimta without difficulty. Her primary complaint is that of diabetic neuropathy affecting her feet. She is scheduled to follow-up with her diabetic physician next week. She presents to proceed with cycle #5 of her systemic chemotherapy with single agent Alimta. She voiced no other specific complaints today. She denied having any nausea or vomiting. She has no fever or chills. She denied having any weight loss or night sweats. She has no chest pain but  continues to have shortness of breath at baseline and increased with exertion with no cough or hemoptysis.   MEDICAL HISTORY: Past Medical History  Diagnosis Date  . Hypertension   . Hyperlipidemia   . Cerebrovascular accident history of mild cerebrovascular accident which caused some visual disturbance.   . Diabetes mellitus   . Morbid obesity   . Lung mass     Right upper Lobe  . Shortness of breath   . Pneumonia     hx  . Peripheral vascular disease     hx blood clot 30 yrs ago leg  . GERD (gastroesophageal reflux disease)   . Arthritis   . Pneumothorax, iatrogenic 8/2/113-discharge note    Post Biospy  . Cough with hemoptysis 01/22/12    Prior to discharge  . Tachycardia 01/22/12    Prior to discharge  . History of radiation therapy 02/18/12,02/23/12,02/25/12., 03/01/12,&03/03/12    RULlung 50Gy/5/fx  . Lung cancer 01/21/12  . AAA (abdominal aortic aneurysm)     ALLERGIES:  is allergic to asa buff (mag.  MEDICATIONS:  Current Outpatient Prescriptions  Medication Sig Dispense Refill  . acetaminophen (TYLENOL) 500 MG tablet Take 500 mg by mouth every 6 (six) hours as needed for moderate pain (inflammation).    Marland Kitchen allopurinol (ZYLOPRIM) 100 MG tablet Take 100 mg by mouth daily.     Marland Kitchen amLODipine (NORVASC) 10 MG tablet Take 10 mg by mouth daily.      Marland Kitchen aspirin EC 81 MG tablet Take 81 mg by mouth daily.    Marland Kitchen atorvastatin (LIPITOR) 20 MG tablet Take 20 mg  by mouth daily.      . cholecalciferol (VITAMIN D) 1000 UNITS tablet Take 1,000 Units by mouth daily.    Marland Kitchen COLCRYS 0.6 MG tablet Take 0.6 mg by mouth 2 (two) times daily as needed (gout). For gout    . dexamethasone (DECADRON) 4 MG tablet Take 1 tablet by mouth the day before, the day of and the day after chemotherapy 30 tablet 0  . eprosartan-hydrochlorothiazide (TEVETEN HCT) 600-12.5 MG per tablet Take 1 tablet by mouth daily.     . fluticasone (FLONASE) 50 MCG/ACT nasal spray Place 1 spray into both nostrils daily.     . folic acid  (FOLVITE) 1 MG tablet Take 1 mg by mouth daily.    Marland Kitchen HYDROcodone-acetaminophen (NORCO/VICODIN) 5-325 MG per tablet Take 1 tablet by mouth every 6 (six) hours as needed for moderate pain. 15 tablet 0  . JANUVIA 100 MG tablet Take 100 mg by mouth daily.     Marland Kitchen losartan-hydrochlorothiazide (HYZAAR) 100-12.5 MG per tablet Take 1 tablet by mouth daily.    Marland Kitchen NEXIUM 40 MG capsule Take 40 mg by mouth daily before breakfast.     . nystatin (MYCOSTATIN) powder Apply topically 4 (four) times daily. 15 g 0  . omeprazole (PRILOSEC) 20 MG capsule Take 20 mg by mouth daily.    Marland Kitchen oxyCODONE-acetaminophen (PERCOCET/ROXICET) 5-325 MG per tablet Take by mouth every 4 (four) hours as needed for severe pain.    . potassium chloride SA (K-DUR,KLOR-CON) 20 MEQ tablet Take 1 tablet (20 mEq total) by mouth daily. 10 tablet 0   No current facility-administered medications for this visit.    SURGICAL HISTORY:  Past Surgical History  Procedure Laterality Date  . Abdominal aortic aneurysm repair  06/28/10    Stent Graft repair    . Exploratory laparotomy      For ectopic pregnancy  . Lumbar disc surgery    . Aortogram   03/03/11    with stenting , left external iliac artery  . Appendectomy    . Navigational bronchoscopy      Right Upper Lobe Mass with Biopsies, Brushings, Washings, and bronchoalveolar Lavage:  . Paratracheal adenopathy and bilateral adrenal nodules      REVIEW OF SYSTEMS:  Constitutional: positive for fatigue Eyes: negative Ears, nose, mouth, throat, and face: negative Respiratory: positive for cough and dyspnea on exertion Cardiovascular: negative Gastrointestinal: negative Genitourinary:negative Integument/breast: negative Hematologic/lymphatic: negative Musculoskeletal:negative Neurological: positive for Complains of diabetic peripheral neuropathy affecting her feet Behavioral/Psych: negative Endocrine: negative Allergic/Immunologic: negative   PHYSICAL EXAMINATION: General  appearance: alert, cooperative, fatigued and no distress Head: Normocephalic, without obvious abnormality, atraumatic Neck: no adenopathy, no JVD, supple, symmetrical, trachea midline and thyroid not enlarged, symmetric, no tenderness/mass/nodules Lymph nodes: Cervical, supraclavicular, and axillary nodes normal. Resp: clear to auscultation bilaterally Back: symmetric, no curvature. ROM normal. No CVA tenderness. Cardio: regular rate and rhythm, S1, S2 normal, no murmur, click, rub or gallop GI: soft, non-tender; bowel sounds normal; no masses,  no organomegaly Extremities: 1+ edema bilaterally Neurologic: Alert and oriented X 3, normal strength and tone. Normal symmetric reflexes. Normal coordination and gait  ECOG PERFORMANCE STATUS: 2 - Symptomatic, <50% confined to bed  Blood pressure 98/54, pulse 92, temperature 98.1 F (36.7 C), temperature source Oral, resp. rate 18, SpO2 100 %.  LABORATORY DATA: Lab Results  Component Value Date   WBC 4.8 01/02/2015   HGB 10.7* 01/02/2015   HCT 33.1* 01/02/2015   MCV 103.1* 01/02/2015   PLT 329 01/02/2015  Chemistry      Component Value Date/Time   NA 146* 01/02/2015 0930   NA 142 10/01/2014 0522   K 3.0* 01/02/2015 0930   K 4.5 10/01/2014 0522   CL 114* 10/01/2014 0522   CO2 24 01/02/2015 0930   CO2 21 10/01/2014 0522   BUN 30.1* 01/02/2015 0930   BUN 30* 10/01/2014 0522   CREATININE 1.1 01/02/2015 0930   CREATININE 1.04 10/01/2014 0522   CREATININE 1.10 10/23/2013 1118      Component Value Date/Time   CALCIUM 9.2 01/02/2015 0930   CALCIUM 9.1 10/01/2014 0522   ALKPHOS 203* 01/02/2015 0930   ALKPHOS 281* 09/28/2014 0008   AST 20 01/02/2015 0930   AST 45* 09/28/2014 0008   ALT 15 01/02/2015 0930   ALT 73* 09/28/2014 0008   BILITOT 0.34 01/02/2015 0930   BILITOT 0.8 09/28/2014 0008       RADIOGRAPHIC STUDIES: Ct Chest Wo Contrast  12/10/2014   CLINICAL DATA:  Restaging non small cell lung cancer.  EXAM: CT CHEST  WITHOUT CONTRAST  TECHNIQUE: Multidetector CT imaging of the chest was performed following the standard protocol without IV contrast.  COMPARISON:  Chest CT 09/03/2014  FINDINGS: Chest wall: No breast masses are identified. There is persistent soft tissue thickening involving the right chest wall adjacent to an abnormal anterior rib. Findings most likely due to radiation change. Small scattered axillary and subpectoral lymph nodes but no mass or adenopathy. The bony thorax is otherwise unremarkable. Stable degenerative changes involving the thoracic spine. No destructive bone lesions.  Mediastinum: The heart is normal in size. No pericardial effusion. Stable advanced atherosclerotic calcifications involving the aorta and branch vessels including the coronary arteries. The esophagus is grossly normal. Stable small hiatal hernia. Small mediastinal lymph nodes are stable. The 6 mm upper mediastinal node on image number 13 measures 6 mm short axis and is unchanged. The precarinal node on image number 21 measures 10 mm short axis and is unchanged. No new mediastinal or hilar lymph nodes.  Lungs/ pleura: The large right upper lobe lung mass is relatively stable. It measures approximately 8 x 3.1 cm and previously measured 7.1 x 3.1 cm. It is likely a combination of tumor and radiation fibrosis. No CT findings for metastatic pulmonary disease. No acute pulmonary findings. No pleural effusion. Stable emphysematous changes.  Upper abdomen: Stable bilateral adrenal gland adenomas and benign left hepatic lobe cyst.  IMPRESSION: 1. Overall stable CT appearance of these large complex right upper lobe lung mass, likely a combination of tumor and radiation change. 2. Stable mediastinal lymph nodes. 3. No CT findings for metastatic pulmonary disease. 4. Stable soft tissue thickening involving the anterior chest wall at the same level of the tumor and an abnormal anterior rib. This all likely radiation change. 5. Stable adrenal  gland adenomas.   Electronically Signed   By: Marijo Sanes M.D.   On: 12/10/2014 09:00   ASSESSMENT AND PLAN: This is a very pleasant 81 years old [place] American female with unresectable stage IIIA non-small cell lung cancer and the patient is not a candidate for radiotherapy. She was treated with systemic chemotherapy with carboplatin and Alimta status post 6 cycles. She is tolerated her treatment fairly well except for the fatigue. She had been on observation. The  CT scan of the chest showed evidence for disease progression with increase in the volume of the right upper lobe lobular mass. Now being treated with single agent Alimta at 500 mg/m given every 3  weeks. Status post 4 cycles. The patient was discussed with and also seen by Dr. Julien Nordmann. Her recent restaging CT scan revealed stable disease. She will continue with single agent Alimta cycle #5 today as scheduled. She was found to be hypokalemic today with a potassium of 3.0. A prescription for potassium chloride 20 mEq by mouth daily for the next 10 days was sent her pharmacy of record via E scribed. She will follow-up in 3 weeks prior to start of cycle #6 with repeat CBC differential and C met.  She is to continue on folic acid,1 mg by mouth daily. She admits to forgetting to take her dexamethasone yesterday and this morning. She was reminded to take this as prescribed and to keep a close watch on her blood sugars.  She was advised to call immediately if she has any concerning symptoms in the interval. The patient voices understanding of current disease status and treatment options and is in agreement with the current care plan.  All questions were answered. The patient knows to call the clinic with any problems, questions or concerns. We can certainly see the patient much sooner if necessary.  Carlton Adam, PA-C 01/02/2015  ADDENDUM: Hematology/Oncology Attending: I had a face to face encounter with the patient today. I  recommended her care plan. This is a very pleasant 81 years old African-American female with unresectable a stage IIIa non-small cell lung cancer who is currently undergoing treatment with single agent Alimta status post 4 cycles. The patient is tolerating her treatment well except for mild fatigue and shortness breath with exertion. I recommended for her to proceed with cycle #5 today as a scheduled. For the hypokalemia the patient will receive potassium chloride supplement. She would come back for follow-up visit in 3 weeks for reevaluation and management of any adverse effect of her treatment. She was advised to call immediately if she has any concerning symptoms in the interval.  Disclaimer: This note was dictated with voice recognition software. Similar sounding words can inadvertently be transcribed and may be missed upon review. Eilleen Kempf., MD 01/06/2015

## 2015-01-02 NOTE — Patient Instructions (Signed)
[facility] Discharge Instructions for Patients Receiving Chemotherapy  Today you received the following chemotherapy agents Alimta  To help prevent nausea and vomiting after your treatment, we encourage you to take your nausea medication as needed   If you develop nausea and vomiting that is not controlled by your nausea medication, call the clinic.   BELOW ARE SYMPTOMS THAT SHOULD BE REPORTED IMMEDIATELY:  *FEVER GREATER THAN 100.5 F  *CHILLS WITH OR WITHOUT FEVER  NAUSEA AND VOMITING THAT IS NOT CONTROLLED WITH YOUR NAUSEA MEDICATION  *UNUSUAL SHORTNESS OF BREATH  *UNUSUAL BRUISING OR BLEEDING  TENDERNESS IN MOUTH AND THROAT WITH OR WITHOUT PRESENCE OF ULCERS  *URINARY PROBLEMS  *BOWEL PROBLEMS  UNUSUAL RASH Items with * indicate a potential emergency and should be followed up as soon as possible.  Feel free to call the clinic you have any questions or concerns. The clinic phone number is [phone].  Please show the CHEMO ALERT CARD at check-in to the Emergency Department and triage nurse.   

## 2015-01-02 NOTE — Telephone Encounter (Signed)
Pt confirmed labs/ov per 07/13 POF, gave pt AVS and Calendar.... KJ

## 2015-01-23 ENCOUNTER — Encounter: Payer: Self-pay | Admitting: Internal Medicine

## 2015-01-23 ENCOUNTER — Ambulatory Visit: Payer: Medicare Other

## 2015-01-23 ENCOUNTER — Telehealth: Payer: Self-pay | Admitting: Internal Medicine

## 2015-01-23 ENCOUNTER — Ambulatory Visit (HOSPITAL_BASED_OUTPATIENT_CLINIC_OR_DEPARTMENT_OTHER): Payer: Medicare Other | Admitting: Internal Medicine

## 2015-01-23 ENCOUNTER — Other Ambulatory Visit: Payer: Self-pay | Admitting: Medical Oncology

## 2015-01-23 ENCOUNTER — Telehealth: Payer: Self-pay | Admitting: *Deleted

## 2015-01-23 ENCOUNTER — Other Ambulatory Visit (HOSPITAL_BASED_OUTPATIENT_CLINIC_OR_DEPARTMENT_OTHER): Payer: Medicare Other

## 2015-01-23 ENCOUNTER — Telehealth: Payer: Self-pay | Admitting: Physician Assistant

## 2015-01-23 ENCOUNTER — Ambulatory Visit (HOSPITAL_COMMUNITY)
Admission: RE | Admit: 2015-01-23 | Discharge: 2015-01-23 | Disposition: A | Discharge status: Home / Self Care | Payer: Medicare Other | Source: Ambulatory Visit | Attending: Internal Medicine | Admitting: Internal Medicine

## 2015-01-23 ENCOUNTER — Telehealth: Payer: Self-pay | Admitting: Medical Oncology

## 2015-01-23 ENCOUNTER — Ambulatory Visit (HOSPITAL_BASED_OUTPATIENT_CLINIC_OR_DEPARTMENT_OTHER): Payer: Medicare Other

## 2015-01-23 VITALS — BP 79/52 | HR 118 | Temp 98.7°F | Resp 22 | Ht 62.0 in

## 2015-01-23 DIAGNOSIS — D649 Anemia, unspecified: Secondary | ICD-10-CM

## 2015-01-23 DIAGNOSIS — C3411 Malignant neoplasm of upper lobe, right bronchus or lung: Secondary | ICD-10-CM

## 2015-01-23 DIAGNOSIS — C3490 Malignant neoplasm of unspecified part of unspecified bronchus or lung: Secondary | ICD-10-CM

## 2015-01-23 DIAGNOSIS — T451X5A Adverse effect of antineoplastic and immunosuppressive drugs, initial encounter: Secondary | ICD-10-CM

## 2015-01-23 DIAGNOSIS — M549 Dorsalgia, unspecified: Secondary | ICD-10-CM | POA: Diagnosis not present

## 2015-01-23 DIAGNOSIS — Z5111 Encounter for antineoplastic chemotherapy: Secondary | ICD-10-CM

## 2015-01-23 DIAGNOSIS — D6481 Anemia due to antineoplastic chemotherapy: Secondary | ICD-10-CM

## 2015-01-23 DIAGNOSIS — I10 Essential (primary) hypertension: Secondary | ICD-10-CM

## 2015-01-23 DIAGNOSIS — R600 Localized edema: Secondary | ICD-10-CM

## 2015-01-23 DIAGNOSIS — M79604 Pain in right leg: Secondary | ICD-10-CM

## 2015-01-23 DIAGNOSIS — R7989 Other specified abnormal findings of blood chemistry: Secondary | ICD-10-CM

## 2015-01-23 DIAGNOSIS — M79605 Pain in left leg: Secondary | ICD-10-CM

## 2015-01-23 LAB — CBC WITH DIFFERENTIAL/PLATELET
BASO%: 0.2 % (ref 0.0–2.0)
Basophils Absolute: 0 10*3/uL (ref 0.0–0.1)
EOS ABS: 0.1 10*3/uL (ref 0.0–0.5)
EOS%: 1.7 % (ref 0.0–7.0)
HCT: 25 % — ABNORMAL LOW (ref 34.8–46.6)
HGB: 8 g/dL — ABNORMAL LOW (ref 11.6–15.9)
LYMPH%: 6.3 % — AB (ref 14.0–49.7)
MCH: 33.9 pg (ref 25.1–34.0)
MCHC: 32 g/dL (ref 31.5–36.0)
MCV: 105.9 fL — ABNORMAL HIGH (ref 79.5–101.0)
MONO#: 0.6 10*3/uL (ref 0.1–0.9)
MONO%: 11.3 % (ref 0.0–14.0)
NEUT%: 80.5 % — ABNORMAL HIGH (ref 38.4–76.8)
NEUTROS ABS: 4.2 10*3/uL (ref 1.5–6.5)
Platelets: 273 10*3/uL (ref 145–400)
RBC: 2.36 10*6/uL — ABNORMAL LOW (ref 3.70–5.45)
RDW: 20.7 % — ABNORMAL HIGH (ref 11.2–14.5)
WBC: 5.2 10*3/uL (ref 3.9–10.3)
lymph#: 0.3 10*3/uL — ABNORMAL LOW (ref 0.9–3.3)

## 2015-01-23 LAB — COMPREHENSIVE METABOLIC PANEL (CC13)
ALT: 17 U/L (ref 0–55)
AST: 20 U/L (ref 5–34)
Albumin: 2.3 g/dL — ABNORMAL LOW (ref 3.5–5.0)
Alkaline Phosphatase: 182 U/L — ABNORMAL HIGH (ref 40–150)
Anion Gap: 10 mEq/L (ref 3–11)
BUN: 54.6 mg/dL — AB (ref 7.0–26.0)
CHLORIDE: 109 meq/L (ref 98–109)
CO2: 22 mEq/L (ref 22–29)
Calcium: 8.1 mg/dL — ABNORMAL LOW (ref 8.4–10.4)
Creatinine: 1.9 mg/dL — ABNORMAL HIGH (ref 0.6–1.1)
EGFR: 28 mL/min/{1.73_m2} — ABNORMAL LOW (ref >90)
GLUCOSE: 116 mg/dL (ref 70–140)
Potassium: 4.6 mEq/L (ref 3.5–5.1)
SODIUM: 140 meq/L (ref 136–145)
TOTAL PROTEIN: 6.2 g/dL — AB (ref 6.4–8.3)
Total Bilirubin: 0.39 mg/dL (ref 0.20–1.20)

## 2015-01-23 LAB — ABO/RH: ABO/RH(D): B POS

## 2015-01-23 MED ORDER — DEXAMETHASONE 4 MG PO TABS: ORAL_TABLET | ORAL | Status: DC

## 2015-01-23 MED ORDER — SODIUM CHLORIDE 0.9 % IV SOLN
Freq: Once | INTRAVENOUS | Status: AC
  Administered 2015-01-23: 12:00:00 via INTRAVENOUS

## 2015-01-23 MED ORDER — HYDROCODONE-ACETAMINOPHEN 7.5-325 MG PO TABS: 1.0000 | ORAL_TABLET | Freq: Four times a day (QID) | ORAL | Status: DC | PRN

## 2015-01-23 NOTE — Telephone Encounter (Signed)
per pof to send pt back to lab-sent [facility] email sch pt for 2 units of blood tomorrow-pt to get pdated sch b4 leaving

## 2015-01-23 NOTE — Patient Instructions (Signed)
Smoking Cessation Quitting smoking is important to your health and has many advantages. However, it is not always easy to quit since nicotine is a very addictive drug. Oftentimes, people try 3 times or more before being able to quit. This document explains the best ways for you to prepare to quit smoking. Quitting takes hard work and a lot of effort, but you can do it. ADVANTAGES OF QUITTING SMOKING  You will live longer, feel better, and live better.  Your body will feel the impact of quitting smoking almost immediately.  Within 20 minutes, blood pressure decreases. Your pulse returns to its normal level.  After 8 hours, carbon monoxide levels in the blood return to normal. Your oxygen level increases.  After 24 hours, the chance of having a heart attack starts to decrease. Your breath, hair, and body stop smelling like smoke.  After 48 hours, damaged nerve endings begin to recover. Your sense of taste and smell improve.  After 72 hours, the body is virtually free of nicotine. Your bronchial tubes relax and breathing becomes easier.  After 2 to 12 weeks, lungs can hold more air. Exercise becomes easier and circulation improves.  The risk of having a heart attack, stroke, cancer, or lung disease is greatly reduced.  After 1 year, the risk of coronary heart disease is cut in half.  After 5 years, the risk of stroke falls to the same as a nonsmoker.  After 10 years, the risk of lung cancer is cut in half and the risk of other cancers decreases significantly.  After 15 years, the risk of coronary heart disease drops, usually to the level of a nonsmoker.  If you are pregnant, quitting smoking will improve your chances of having a healthy baby.  The people you live with, especially any children, will be healthier.  You will have extra money to spend on things other than cigarettes. QUESTIONS TO THINK ABOUT BEFORE ATTEMPTING TO QUIT You may want to talk about your answers with your  health care provider.  Why do you want to quit?  If you tried to quit in the past, what helped and what did not?  What will be the most difficult situations for you after you quit? How will you plan to handle them?  Who can help you through the tough times? Your family? Friends? A health care provider?  What pleasures do you get from smoking? What ways can you still get pleasure if you quit? Here are some questions to ask your health care provider:  How can you help me to be successful at quitting?  What medicine do you think would be best for me and how should I take it?  What should I do if I need more help?  What is smoking withdrawal like? How can I get information on withdrawal? GET READY  Set a quit date.  Change your environment by getting rid of all cigarettes, ashtrays, matches, and lighters in your home, car, or work. Do not let people smoke in your home.  Review your past attempts to quit. Think about what worked and what did not. GET SUPPORT AND ENCOURAGEMENT You have a better chance of being successful if you have help. You can get support in many ways.  Tell your family, friends, and coworkers that you are going to quit and need their support. Ask them not to smoke around you.  Get individual, group, or telephone counseling and support. Programs are available at local hospitals and health centers. Call   your local health department for information about programs in your area.  Spiritual beliefs and practices may help some smokers quit.  Download a "quit meter" on your computer to keep track of quit statistics, such as how long you have gone without smoking, cigarettes not smoked, and money saved.  Get a self-help book about quitting smoking and staying off tobacco. LEARN NEW SKILLS AND BEHAVIORS  Distract yourself from urges to smoke. Talk to someone, go for a walk, or occupy your time with a task.  Change your normal routine. Take a different route to work.  Drink tea instead of coffee. Eat breakfast in a different place.  Reduce your stress. Take a hot bath, exercise, or read a book.  Plan something enjoyable to do every day. Reward yourself for not smoking.  Explore interactive web-based programs that specialize in helping you quit. GET MEDICINE AND USE IT CORRECTLY Medicines can help you stop smoking and decrease the urge to smoke. Combining medicine with the above behavioral methods and support can greatly increase your chances of successfully quitting smoking.  Nicotine replacement therapy helps deliver nicotine to your body without the negative effects and risks of smoking. Nicotine replacement therapy includes nicotine gum, lozenges, inhalers, nasal sprays, and skin patches. Some may be available over-the-counter and others require a prescription.  Antidepressant medicine helps people abstain from smoking, but how this works is unknown. This medicine is available by prescription.  Nicotinic receptor partial agonist medicine simulates the effect of nicotine in your brain. This medicine is available by prescription. Ask your health care provider for advice about which medicines to use and how to use them based on your health history. Your health care provider will tell you what side effects to look out for if you choose to be on a medicine or therapy. Carefully read the information on the package. Do not use any other product containing nicotine while using a nicotine replacement product.  RELAPSE OR DIFFICULT SITUATIONS Most relapses occur within the first 3 months after quitting. Do not be discouraged if you start smoking again. Remember, most people try several times before finally quitting. You may have symptoms of withdrawal because your body is used to nicotine. You may crave cigarettes, be irritable, feel very hungry, cough often, get headaches, or have difficulty concentrating. The withdrawal symptoms are only temporary. They are strongest  when you first quit, but they will go away within 10-14 days. To reduce the chances of relapse, try to:  Avoid drinking alcohol. Drinking lowers your chances of successfully quitting.  Reduce the amount of caffeine you consume. Once you quit smoking, the amount of caffeine in your body increases and can give you symptoms, such as a rapid heartbeat, sweating, and anxiety.  Avoid smokers because they can make you want to smoke.  Do not let weight gain distract you. Many smokers will gain weight when they quit, usually less than 10 pounds. Eat a healthy diet and stay active. You can always lose the weight gained after you quit.  Find ways to improve your mood other than smoking. FOR MORE INFORMATION  www.smokefree.gov  Document Released: 06/02/2001 Document Revised: 10/23/2013 Document Reviewed: 09/17/2011 [employer] Patient Information 2015 [employer]. This information is not intended to replace advice given to you by your health care provider. Make sure you discuss any questions you have with your health care provider.  

## 2015-01-23 NOTE — Telephone Encounter (Signed)
Per chemo RN I have rescheduled treatment appt from today to next week. RN to give patient schedule

## 2015-01-23 NOTE — Telephone Encounter (Signed)
per reply from [facility] pt sch 2 units of blood here @ 9:30. Called & talked w/ [name] grandson gave time/date/location

## 2015-01-23 NOTE — Patient Instructions (Signed)
Dehydration, Adult Dehydration is when you lose more fluids from the body than you take in. Vital organs like the kidneys, brain, and heart cannot function without a proper amount of fluids and salt. Any loss of fluids from the body can cause dehydration.  CAUSES   Vomiting.  Diarrhea.  Excessive sweating.  Excessive urine output.  Fever. SYMPTOMS  Mild dehydration  Thirst.  Dry lips.  Slightly dry mouth. Moderate dehydration  Very dry mouth.  Sunken eyes.  Skin does not bounce back quickly when lightly pinched and released.  Dark urine and decreased urine production.  Decreased tear production.  Headache. Severe dehydration  Very dry mouth.  Extreme thirst.  Rapid, weak pulse (more than 100 beats per minute at rest).  Cold hands and feet.  Not able to sweat in spite of heat and temperature.  Rapid breathing.  Blue lips.  Confusion and lethargy.  Difficulty being awakened.  Minimal urine production.  No tears. DIAGNOSIS  Your caregiver will diagnose dehydration based on your symptoms and your exam. Blood and urine tests will help confirm the diagnosis. The diagnostic evaluation should also identify the cause of dehydration. TREATMENT  Treatment of mild or moderate dehydration can often be done at home by increasing the amount of fluids that you drink. It is best to drink small amounts of fluid more often. Drinking too much at one time can make vomiting worse. Refer to the home care instructions below. Severe dehydration needs to be treated at the hospital where you will probably be given intravenous (IV) fluids that contain water and electrolytes. HOME CARE INSTRUCTIONS   Ask your caregiver about specific rehydration instructions.  Drink enough fluids to keep your urine clear or pale yellow.  Drink small amounts frequently if you have nausea and vomiting.  Eat as you normally do.  Avoid:  Foods or drinks high in sugar.  Carbonated  drinks.  Juice.  Extremely hot or cold fluids.  Drinks with caffeine.  Fatty, greasy foods.  Alcohol.  Tobacco.  Overeating.  Gelatin desserts.  Wash your hands well to avoid spreading bacteria and viruses.  Only take over-the-counter or prescription medicines for pain, discomfort, or fever as directed by your caregiver.  Ask your caregiver if you should continue all prescribed and over-the-counter medicines.  Keep all follow-up appointments with your caregiver. SEEK MEDICAL CARE IF:  You have abdominal pain and it increases or stays in one area (localizes).  You have a rash, stiff neck, or severe headache.  You are irritable, sleepy, or difficult to awaken.  You are weak, dizzy, or extremely thirsty. SEEK IMMEDIATE MEDICAL CARE IF:   You are unable to keep fluids down or you get worse despite treatment.  You have frequent episodes of vomiting or diarrhea.  You have blood or green matter (bile) in your vomit.  You have blood in your stool or your stool looks black and tarry.  You have not urinated in 6 to 8 hours, or you have only urinated a small amount of very dark urine.  You have a fever.  You faint. MAKE SURE YOU:   Understand these instructions.  Will watch your condition.  Will get help right away if you are not doing well or get worse. Document Released: 06/08/2005 Document Revised: 08/31/2011 Document Reviewed: 01/26/2011 Exit[employer] Patient Information 2015 [employer]. This information is not intended to replace advice given to you by your health care provider. Make sure you discuss any questions you have with your health care   provider.  

## 2015-01-23 NOTE — Progress Notes (Signed)
Hold Chemotherapy today due to Creatinine 1.9, administer fluids per Dr. Julien Nordmann. Patient verbalized understanding.

## 2015-01-23 NOTE — Progress Notes (Signed)
[facility] [phone]   [phone]) [phone]  OFFICE PROGRESS NOTE  [name], MD [place] [address]  DIAGNOSIS: Lung cancer  Primary site: Lung (Right)  Staging method: AJCC 7th Edition  Clinical: Stage IIIA (T3, N2, M0) signed by Curt Bears, MD on 01/01/2014 6:57 PM  Summary: Stage IIIA (T3, N2, M0)   PRIOR THERAPY: Systemic chemotherapy with carboplatin for an AUC of 4 and Alimta 400 mg/m2 given in 3 weeks. Status post 6 cycles, last dose was given 05/07/2014 with partial response.   CURRENT THERAPY: Systemic chemotherapy with single agent Alimta 400 MG/M2 every 3 weeks. First dose given 09/17/2014. Status post 5 cycles  DISEASE STAGE:  Lung cancer  Primary site: Lung (Right)  Staging method: AJCC 7th Edition  Clinical: Stage IIIA (T3, N2, M0) signed by Curt Bears, MD on 01/01/2014 6:57 PM  Summary: Stage IIIA (T3, N2, M0)  CHEMOTHERAPY INTENT: palliative  CURRENT # OF CHEMOTHERAPY CYCLES: 6 CURRENT ANTIEMETICS: compazine  CURRENT SMOKING STATUS: current smoker  ORAL CHEMOTHERAPY AND CONSENT: n/a  CURRENT BISPHOSPHONATES USE: none  PAIN MANAGEMENT: hydrocodone  NARCOTICS INDUCED CONSTIPATION: none  LIVING WILL AND CODE STATUS:   INTERVAL HISTORY: Beverly Lewis 81 y.o. female returns to the clinic today for follow-up visit accompanied by her grandson. The patient is feeling fine today except for the swelling of the lower extremity and mild fatigue. She also has pain in her legs and back. She is currently on Tylenol No. 3 but not helping and she is requesting another pain medication. She denied having any significant chest pain but has shortness breath with exertion with no cough or hemoptysis. She denied having any significant weight loss or night sweats. No fever or chills. She has no bleeding issues. She is here today to start cycle #6 of her chemotherapy with single agent  Alimta.  MEDICAL HISTORY: Past Medical History  Diagnosis Date  . Hypertension   . Hyperlipidemia   . Cerebrovascular accident history of mild cerebrovascular accident which caused some visual disturbance.   . Diabetes mellitus   . Morbid obesity   . Lung mass     Right upper Lobe  . Shortness of breath   . Pneumonia     hx  . Peripheral vascular disease     hx blood clot 30 yrs ago leg  . GERD (gastroesophageal reflux disease)   . Arthritis   . Pneumothorax, iatrogenic 8/2/113-discharge note    Post Biospy  . Cough with hemoptysis 01/22/12    Prior to discharge  . Tachycardia 01/22/12    Prior to discharge  . History of radiation therapy 02/18/12,02/23/12,02/25/12., 03/01/12,&03/03/12    RULlung 50Gy/5/fx  . Lung cancer 01/21/12  . AAA (abdominal aortic aneurysm)     ALLERGIES:  is allergic to asa buff (mag.  MEDICATIONS:  Current Outpatient Prescriptions  Medication Sig Dispense Refill  . acetaminophen-codeine (TYLENOL #3) 300-30 MG per tablet Take 1 tablet by mouth 2 (two) times daily as needed.  2  . allopurinol (ZYLOPRIM) 100 MG tablet Take 100 mg by mouth daily.     Marland Kitchen aspirin EC 81 MG tablet Take 81 mg by mouth daily.    Marland Kitchen atorvastatin (LIPITOR) 20 MG tablet Take 20 mg by mouth daily.      Marland Kitchen COLCRYS 0.6 MG tablet Take 0.6 mg by mouth 2 (two) times daily as needed (gout). For gout    . fluticasone (FLONASE) 50 MCG/ACT nasal spray  Place 1 spray into both nostrils daily.     . folic acid (FOLVITE) 1 MG tablet Take 1 mg by mouth daily.    Marland Kitchen JANUVIA 100 MG tablet Take 100 mg by mouth daily.     Marland Kitchen nystatin (MYCOSTATIN) powder Apply topically 4 (four) times daily. 15 g 0  . potassium chloride SA (K-DUR,KLOR-CON) 20 MEQ tablet Take 1 tablet (20 mEq total) by mouth daily. 10 tablet 0  . acetaminophen (TYLENOL) 500 MG tablet Take 500 mg by mouth every 6 (six) hours as needed for moderate pain (inflammation).    Marland Kitchen amLODipine (NORVASC) 10 MG tablet Take 10 mg by mouth daily.      .  cholecalciferol (VITAMIN D) 1000 UNITS tablet Take 1,000 Units by mouth daily.    Marland Kitchen dexamethasone (DECADRON) 4 MG tablet Take 1 tablet by mouth the day before, the day of and the day after chemotherapy (Patient not taking: Reported on 01/23/2015) 30 tablet 0  . eprosartan-hydrochlorothiazide (TEVETEN HCT) 600-12.5 MG per tablet Take 1 tablet by mouth daily.     Marland Kitchen losartan-hydrochlorothiazide (HYZAAR) 100-12.5 MG per tablet Take 1 tablet by mouth daily.    Marland Kitchen NEXIUM 40 MG capsule Take 40 mg by mouth daily before breakfast.     . omeprazole (PRILOSEC) 20 MG capsule Take 20 mg by mouth daily.     No current facility-administered medications for this visit.    SURGICAL HISTORY:  Past Surgical History  Procedure Laterality Date  . Abdominal aortic aneurysm repair  06/28/10    Stent Graft repair    . Exploratory laparotomy      For ectopic pregnancy  . Lumbar disc surgery    . Aortogram   03/03/11    with stenting , left external iliac artery  . Appendectomy    . Navigational bronchoscopy      Right Upper Lobe Mass with Biopsies, Brushings, Washings, and bronchoalveolar Lavage:  . Paratracheal adenopathy and bilateral adrenal nodules      REVIEW OF SYSTEMS:  Constitutional: positive for fatigue Eyes: negative Ears, nose, mouth, throat, and face: negative Respiratory: positive for dyspnea on exertion Cardiovascular: negative Gastrointestinal: negative Genitourinary:negative Integument/breast: negative Hematologic/lymphatic: negative Musculoskeletal:negative Neurological: negative Behavioral/Psych: negative Endocrine: negative Allergic/Immunologic: negative   PHYSICAL EXAMINATION: General appearance: alert, cooperative, fatigued and no distress Head: Normocephalic, without obvious abnormality, atraumatic Neck: no adenopathy, no JVD, supple, symmetrical, trachea midline and thyroid not enlarged, symmetric, no tenderness/mass/nodules Lymph nodes: Cervical, supraclavicular, and axillary  nodes normal. Resp: clear to auscultation bilaterally Back: symmetric, no curvature. ROM normal. No CVA tenderness. Cardio: regular rate and rhythm, S1, S2 normal, no murmur, click, rub or gallop GI: soft, non-tender; bowel sounds normal; no masses,  no organomegaly Extremities: extremities normal, atraumatic, no cyanosis or edema Neurologic: Alert and oriented X 3, normal strength and tone. Normal symmetric reflexes. Normal coordination and gait  ECOG PERFORMANCE STATUS: 2 - Symptomatic, <50% confined to bed  Blood pressure 79/52, pulse 118, temperature 98.7 F (37.1 C), temperature source Oral, resp. rate 22, height '5\' 2"'$  (1.575 m), SpO2 93 %.  LABORATORY DATA: Lab Results  Component Value Date   WBC 5.2 01/23/2015   HGB 8.0* 01/23/2015   HCT 25.0* 01/23/2015   MCV 105.9* 01/23/2015   PLT 273 01/23/2015      Chemistry      Component Value Date/Time   NA 146* 01/02/2015 0930   NA 142 10/01/2014 0522   K 3.0* 01/02/2015 0930   K 4.5 10/01/2014 0522  CL 114* 10/01/2014 0522   CO2 24 01/02/2015 0930   CO2 21 10/01/2014 0522   BUN 30.1* 01/02/2015 0930   BUN 30* 10/01/2014 0522   CREATININE 1.1 01/02/2015 0930   CREATININE 1.04 10/01/2014 0522   CREATININE 1.10 10/23/2013 1118      Component Value Date/Time   CALCIUM 9.2 01/02/2015 0930   CALCIUM 9.1 10/01/2014 0522   ALKPHOS 203* 01/02/2015 0930   ALKPHOS 281* 09/28/2014 0008   AST 20 01/02/2015 0930   AST 45* 09/28/2014 0008   ALT 15 01/02/2015 0930   ALT 73* 09/28/2014 0008   BILITOT 0.34 01/02/2015 0930   BILITOT 0.8 09/28/2014 0008       RADIOGRAPHIC STUDIES: No results found.  ASSESSMENT AND PLAN: This is a very pleasant 81 years old African-American female with recurrent non-small cell lung cancer, adenocarcinoma. She is currently undergoing systemic chemotherapy with single agent Alimta status post 5 cycles and tolerating her treatment well. She will proceed with cycle #6 today as scheduled. The patient  would come back for follow-up visit in 3 weeks for evaluation after repeating CT scan of the chest for restaging of her disease. For the chemotherapy-induced anemia, I will arrange for the patient to receive 2 units of PRBCs transfusion. For the hypertension, the patient was encouraged to increase her oral intake and I will arrange for her to receive 1 L of normal saline during her treatment today. For pain management, the patient was given prescription for Norco 7.5/325 mg by mouth as needed for pain. She was advised to call immediately if she has any concerning symptoms in the interval.  The patient voices understanding of current disease status and treatment options and is in agreement with the current care plan.  All questions were answered. The patient knows to call the clinic with any problems, questions or concerns. We can certainly see the patient much sooner if necessary.  Disclaimer: This note was dictated with voice recognition software. Similar sounding words can inadvertently be transcribed and may not be corrected upon review.

## 2015-01-23 NOTE — Telephone Encounter (Signed)
Left message with pt ?grandson, not brad , that pt has blood transfusion tomorrow at cancer center at 0930.

## 2015-01-24 ENCOUNTER — Ambulatory Visit (HOSPITAL_BASED_OUTPATIENT_CLINIC_OR_DEPARTMENT_OTHER): Payer: Medicare Other

## 2015-01-24 VITALS — BP 120/82 | HR 79 | Temp 97.8°F | Resp 16

## 2015-01-24 DIAGNOSIS — D649 Anemia, unspecified: Secondary | ICD-10-CM | POA: Diagnosis not present

## 2015-01-24 DIAGNOSIS — D6481 Anemia due to antineoplastic chemotherapy: Secondary | ICD-10-CM

## 2015-01-24 LAB — PREPARE RBC (CROSSMATCH)

## 2015-01-24 MED ORDER — ACETAMINOPHEN 325 MG PO TABS
ORAL_TABLET | ORAL | Status: AC
  Filled 2015-01-24: qty 2

## 2015-01-24 MED ORDER — SODIUM CHLORIDE 0.9 % IV SOLN: 250.0000 mL | Freq: Once | INTRAVENOUS | Status: DC

## 2015-01-24 MED ORDER — SODIUM CHLORIDE 0.9 % IV SOLN
250.0000 mL | Freq: Once | INTRAVENOUS | Status: AC
  Administered 2015-01-24: 250 mL via INTRAVENOUS

## 2015-01-24 MED ORDER — DIPHENHYDRAMINE HCL 25 MG PO CAPS
25.0000 mg | ORAL_CAPSULE | Freq: Once | ORAL | Status: AC
  Administered 2015-01-24: 25 mg via ORAL

## 2015-01-24 MED ORDER — ACETAMINOPHEN 325 MG PO TABS
650.0000 mg | ORAL_TABLET | Freq: Once | ORAL | Status: AC
  Administered 2015-01-24: 650 mg via ORAL

## 2015-01-24 MED ORDER — DIPHENHYDRAMINE HCL 25 MG PO CAPS
ORAL_CAPSULE | ORAL | Status: AC
  Filled 2015-01-24: qty 1

## 2015-01-24 NOTE — Progress Notes (Signed)
Pt states she feels better after receiving IV fluids yesterday.  VS have improved.

## 2015-01-24 NOTE — Patient Instructions (Signed)

## 2015-01-25 LAB — TYPE AND SCREEN
ABO/RH(D): B POS
ANTIBODY SCREEN: NEGATIVE
Unit division: 0
Unit division: 0

## 2015-01-30 ENCOUNTER — Ambulatory Visit: Payer: Medicare Other

## 2015-01-30 ENCOUNTER — Other Ambulatory Visit: Payer: Medicare Other

## 2015-01-31 ENCOUNTER — Other Ambulatory Visit: Payer: Self-pay | Admitting: *Deleted

## 2015-01-31 ENCOUNTER — Telehealth: Payer: Self-pay | Admitting: Internal Medicine

## 2015-01-31 NOTE — Telephone Encounter (Signed)
Spoke with grandson and he is aware of her new appointment

## 2015-02-05 ENCOUNTER — Other Ambulatory Visit: Payer: Self-pay | Admitting: Medical Oncology

## 2015-02-05 DIAGNOSIS — C3490 Malignant neoplasm of unspecified part of unspecified bronchus or lung: Secondary | ICD-10-CM

## 2015-02-06 ENCOUNTER — Ambulatory Visit (HOSPITAL_BASED_OUTPATIENT_CLINIC_OR_DEPARTMENT_OTHER): Payer: Medicare Other

## 2015-02-06 ENCOUNTER — Other Ambulatory Visit (HOSPITAL_BASED_OUTPATIENT_CLINIC_OR_DEPARTMENT_OTHER): Payer: Medicare Other

## 2015-02-06 ENCOUNTER — Other Ambulatory Visit: Payer: Self-pay | Admitting: Medical Oncology

## 2015-02-06 VITALS — BP 108/70 | HR 115 | Temp 98.8°F | Resp 16

## 2015-02-06 DIAGNOSIS — Z5111 Encounter for antineoplastic chemotherapy: Secondary | ICD-10-CM

## 2015-02-06 DIAGNOSIS — C3411 Malignant neoplasm of upper lobe, right bronchus or lung: Secondary | ICD-10-CM | POA: Diagnosis not present

## 2015-02-06 DIAGNOSIS — I878 Other specified disorders of veins: Secondary | ICD-10-CM

## 2015-02-06 DIAGNOSIS — C3490 Malignant neoplasm of unspecified part of unspecified bronchus or lung: Secondary | ICD-10-CM

## 2015-02-06 DIAGNOSIS — C3412 Malignant neoplasm of upper lobe, left bronchus or lung: Secondary | ICD-10-CM

## 2015-02-06 LAB — CBC WITH DIFFERENTIAL/PLATELET
BASO%: 0.1 % (ref 0.0–2.0)
Basophils Absolute: 0 10*3/uL (ref 0.0–0.1)
EOS ABS: 0 10*3/uL (ref 0.0–0.5)
EOS%: 0.1 % (ref 0.0–7.0)
HCT: 34.3 % — ABNORMAL LOW (ref 34.8–46.6)
HEMOGLOBIN: 11.1 g/dL — AB (ref 11.6–15.9)
LYMPH%: 7.4 % — ABNORMAL LOW (ref 14.0–49.7)
MCH: 34 pg (ref 25.1–34.0)
MCHC: 32.3 g/dL (ref 31.5–36.0)
MCV: 105.2 fL — AB (ref 79.5–101.0)
MONO#: 1 10*3/uL — ABNORMAL HIGH (ref 0.1–0.9)
MONO%: 9.8 % (ref 0.0–14.0)
NEUT#: 8.6 10*3/uL — ABNORMAL HIGH (ref 1.5–6.5)
NEUT%: 82.6 % — ABNORMAL HIGH (ref 38.4–76.8)
Platelets: 209 10*3/uL (ref 145–400)
RBC: 3.26 10*6/uL — ABNORMAL LOW (ref 3.70–5.45)
RDW: 26.8 % — AB (ref 11.2–14.5)
WBC: 10.4 10*3/uL — AB (ref 3.9–10.3)
lymph#: 0.8 10*3/uL — ABNORMAL LOW (ref 0.9–3.3)

## 2015-02-06 LAB — COMPREHENSIVE METABOLIC PANEL (CC13)
ALK PHOS: 160 U/L — AB (ref 40–150)
ALT: 20 U/L (ref 0–55)
AST: 23 U/L (ref 5–34)
Albumin: 2.3 g/dL — ABNORMAL LOW (ref 3.5–5.0)
Anion Gap: 9 mEq/L (ref 3–11)
BILIRUBIN TOTAL: 0.36 mg/dL (ref 0.20–1.20)
BUN: 14.1 mg/dL (ref 7.0–26.0)
CO2: 22 mEq/L (ref 22–29)
CREATININE: 1 mg/dL (ref 0.6–1.1)
Calcium: 9 mg/dL (ref 8.4–10.4)
Chloride: 114 mEq/L — ABNORMAL HIGH (ref 98–109)
EGFR: 62 mL/min/{1.73_m2} — ABNORMAL LOW (ref >90)
GLUCOSE: 97 mg/dL (ref 70–140)
Potassium: 3.8 mEq/L (ref 3.5–5.1)
SODIUM: 145 meq/L (ref 136–145)
TOTAL PROTEIN: 6.5 g/dL (ref 6.4–8.3)

## 2015-02-06 MED ORDER — CYANOCOBALAMIN 1000 MCG/ML IJ SOLN
1000.0000 ug | Freq: Once | INTRAMUSCULAR | Status: AC
  Administered 2015-02-06: 1000 ug via INTRAMUSCULAR

## 2015-02-06 MED ORDER — SODIUM CHLORIDE 0.9 % IV SOLN
Freq: Once | INTRAVENOUS | Status: AC
  Administered 2015-02-06: 11:00:00 via INTRAVENOUS
  Filled 2015-02-06: qty 4

## 2015-02-06 MED ORDER — SODIUM CHLORIDE 0.9 % IV SOLN
400.0000 mg/m2 | Freq: Once | INTRAVENOUS | Status: AC
  Administered 2015-02-06: 800 mg via INTRAVENOUS
  Filled 2015-02-06: qty 32

## 2015-02-06 MED ORDER — CYANOCOBALAMIN 1000 MCG/ML IJ SOLN
INTRAMUSCULAR | Status: AC
  Filled 2015-02-06: qty 1

## 2015-02-06 MED ORDER — SODIUM CHLORIDE 0.9 % IV SOLN
Freq: Once | INTRAVENOUS | Status: AC
  Administered 2015-02-06: 11:00:00 via INTRAVENOUS

## 2015-02-06 MED ORDER — SODIUM CHLORIDE 0.9 % IV SOLN: 400.0000 mg/m2 | Freq: Once | INTRAVENOUS | Status: DC

## 2015-02-06 NOTE — Patient Instructions (Addendum)
Per Dr. Julien Nordmann, Take your lasix as prescribed to decrease the swelling in your legs.  [facility] Discharge Instructions for Patients Receiving Chemotherapy  Today you received the following chemotherapy agents:  Alimta  To help prevent nausea and vomiting after your treatment, we encourage you to take your nausea medication as ordered per MD.   If you develop nausea and vomiting that is not controlled by your nausea medication, call the clinic.   BELOW ARE SYMPTOMS THAT SHOULD BE REPORTED IMMEDIATELY:  *FEVER GREATER THAN 100.5 F  *CHILLS WITH OR WITHOUT FEVER  NAUSEA AND VOMITING THAT IS NOT CONTROLLED WITH YOUR NAUSEA MEDICATION  *UNUSUAL SHORTNESS OF BREATH  *UNUSUAL BRUISING OR BLEEDING  TENDERNESS IN MOUTH AND THROAT WITH OR WITHOUT PRESENCE OF ULCERS  *URINARY PROBLEMS  *BOWEL PROBLEMS  UNUSUAL RASH Items with * indicate a potential emergency and should be followed up as soon as possible.  Feel free to call the clinic you have any questions or concerns. The clinic phone number is [phone].  Please show the [facility] at check-in to the Emergency Department and triage nurse.

## 2015-02-06 NOTE — Progress Notes (Signed)
Beverly Lewis ordered port a cath.

## 2015-02-06 NOTE — Progress Notes (Signed)
Per pt required 2 person assist out of car to get to wheel chair. It also took two person assist to get pt from wheel chair to chair. Pt states that she is very weak and descended to the floor. Pt's son from out of town states that pt is living with minimal assistance and needs more. Pt's legs are edematous and peeling. Pt states has been having increased pain in her legs, relieved by norco.  This Probation officer spoke with Dr. Julien Nordmann who stated that pt should take her lasix to relieve the edema and continue with treatment if creatinine is okay. This Probation officer also spoke with his RN Diane who will contact social work about getting pt more assistance at home.   Creatinine level 1.0. Pt stated she has lasix at home, pt instructed to start taking lasix again to reduce the swelling in her legs. Pt informed that social work will be contacting her to help get her some assistance.

## 2015-02-07 ENCOUNTER — Other Ambulatory Visit: Payer: Self-pay | Admitting: *Deleted

## 2015-02-07 DIAGNOSIS — C3490 Malignant neoplasm of unspecified part of unspecified bronchus or lung: Secondary | ICD-10-CM

## 2015-02-11 ENCOUNTER — Ambulatory Visit (HOSPITAL_COMMUNITY): Payer: Medicare Other

## 2015-02-11 ENCOUNTER — Other Ambulatory Visit: Payer: Self-pay | Admitting: Radiology

## 2015-02-12 ENCOUNTER — Encounter (HOSPITAL_COMMUNITY): Payer: Self-pay

## 2015-02-12 ENCOUNTER — Ambulatory Visit (HOSPITAL_COMMUNITY): Payer: Medicare Other

## 2015-02-12 ENCOUNTER — Inpatient Hospital Stay (HOSPITAL_COMMUNITY)
Admission: EM | Admit: 2015-02-12 | Discharge: 2015-02-14 | DRG: 812 | Disposition: A | Discharge status: Home Health | Payer: Medicare Other | Attending: Internal Medicine | Admitting: Internal Medicine

## 2015-02-12 ENCOUNTER — Other Ambulatory Visit (HOSPITAL_COMMUNITY): Payer: Medicare Other

## 2015-02-12 DIAGNOSIS — R627 Adult failure to thrive: Secondary | ICD-10-CM | POA: Diagnosis present

## 2015-02-12 DIAGNOSIS — Z833 Family history of diabetes mellitus: Secondary | ICD-10-CM

## 2015-02-12 DIAGNOSIS — D649 Anemia, unspecified: Secondary | ICD-10-CM | POA: Diagnosis present

## 2015-02-12 DIAGNOSIS — Z9221 Personal history of antineoplastic chemotherapy: Secondary | ICD-10-CM | POA: Diagnosis not present

## 2015-02-12 DIAGNOSIS — D6481 Anemia due to antineoplastic chemotherapy: Secondary | ICD-10-CM | POA: Diagnosis present

## 2015-02-12 DIAGNOSIS — E785 Hyperlipidemia, unspecified: Secondary | ICD-10-CM | POA: Diagnosis present

## 2015-02-12 DIAGNOSIS — T451X5A Adverse effect of antineoplastic and immunosuppressive drugs, initial encounter: Secondary | ICD-10-CM | POA: Diagnosis present

## 2015-02-12 DIAGNOSIS — I739 Peripheral vascular disease, unspecified: Secondary | ICD-10-CM | POA: Diagnosis present

## 2015-02-12 DIAGNOSIS — K219 Gastro-esophageal reflux disease without esophagitis: Secondary | ICD-10-CM | POA: Diagnosis present

## 2015-02-12 DIAGNOSIS — L519 Erythema multiforme, unspecified: Secondary | ICD-10-CM | POA: Diagnosis present

## 2015-02-12 DIAGNOSIS — S80829A Blister (nonthermal), unspecified lower leg, initial encounter: Secondary | ICD-10-CM | POA: Diagnosis present

## 2015-02-12 DIAGNOSIS — M1990 Unspecified osteoarthritis, unspecified site: Secondary | ICD-10-CM | POA: Diagnosis present

## 2015-02-12 DIAGNOSIS — E119 Type 2 diabetes mellitus without complications: Secondary | ICD-10-CM | POA: Diagnosis present

## 2015-02-12 DIAGNOSIS — L8990 Pressure ulcer of unspecified site, unspecified stage: Secondary | ICD-10-CM | POA: Diagnosis present

## 2015-02-12 DIAGNOSIS — X58XXXA Exposure to other specified factors, initial encounter: Secondary | ICD-10-CM | POA: Diagnosis present

## 2015-02-12 DIAGNOSIS — L511 Stevens-Johnson syndrome: Secondary | ICD-10-CM | POA: Diagnosis present

## 2015-02-12 DIAGNOSIS — Z6841 Body Mass Index (BMI) 40.0 and over, adult: Secondary | ICD-10-CM | POA: Diagnosis not present

## 2015-02-12 DIAGNOSIS — S90821A Blister (nonthermal), right foot, initial encounter: Secondary | ICD-10-CM | POA: Diagnosis present

## 2015-02-12 DIAGNOSIS — Z8249 Family history of ischemic heart disease and other diseases of the circulatory system: Secondary | ICD-10-CM

## 2015-02-12 DIAGNOSIS — I10 Essential (primary) hypertension: Secondary | ICD-10-CM | POA: Diagnosis present

## 2015-02-12 DIAGNOSIS — C3490 Malignant neoplasm of unspecified part of unspecified bronchus or lung: Secondary | ICD-10-CM | POA: Diagnosis present

## 2015-02-12 DIAGNOSIS — Z923 Personal history of irradiation: Secondary | ICD-10-CM | POA: Diagnosis not present

## 2015-02-12 DIAGNOSIS — S90822A Blister (nonthermal), left foot, initial encounter: Secondary | ICD-10-CM | POA: Diagnosis present

## 2015-02-12 DIAGNOSIS — Z8679 Personal history of other diseases of the circulatory system: Secondary | ICD-10-CM | POA: Diagnosis not present

## 2015-02-12 DIAGNOSIS — S80822A Blister (nonthermal), left lower leg, initial encounter: Secondary | ICD-10-CM | POA: Diagnosis present

## 2015-02-12 DIAGNOSIS — Z8673 Personal history of transient ischemic attack (TIA), and cerebral infarction without residual deficits: Secondary | ICD-10-CM | POA: Diagnosis not present

## 2015-02-12 DIAGNOSIS — F17210 Nicotine dependence, cigarettes, uncomplicated: Secondary | ICD-10-CM | POA: Diagnosis present

## 2015-02-12 LAB — PREPARE RBC (CROSSMATCH)

## 2015-02-12 LAB — CBC WITH DIFFERENTIAL/PLATELET
BASOS ABS: 0 10*3/uL (ref 0.0–0.1)
BASOS PCT: 0 % (ref 0–1)
Basophils Absolute: 0 10*3/uL (ref 0.0–0.1)
Basophils Relative: 0 % (ref 0–1)
EOS ABS: 0 10*3/uL (ref 0.0–0.7)
Eosinophils Absolute: 0 10*3/uL (ref 0.0–0.7)
Eosinophils Relative: 0 % (ref 0–5)
Eosinophils Relative: 1 % (ref 0–5)
HCT: 16 % — ABNORMAL LOW (ref 36.0–46.0)
HCT: 28.5 % — ABNORMAL LOW (ref 36.0–46.0)
HEMOGLOBIN: 9 g/dL — AB (ref 12.0–15.0)
Hemoglobin: 5 g/dL — CL (ref 12.0–15.0)
LYMPHS PCT: 15 % (ref 12–46)
Lymphocytes Relative: 10 % — ABNORMAL LOW (ref 12–46)
Lymphs Abs: 0.5 10*3/uL — ABNORMAL LOW (ref 0.7–4.0)
Lymphs Abs: 0.4 10*3/uL — ABNORMAL LOW (ref 0.7–4.0)
MCH: 33.1 pg (ref 26.0–34.0)
MCH: 33.8 pg (ref 26.0–34.0)
MCHC: 31.3 g/dL (ref 30.0–36.0)
MCHC: 31.6 g/dL (ref 30.0–36.0)
MCV: 106 fL — ABNORMAL HIGH (ref 78.0–100.0)
MCV: 107.1 fL — ABNORMAL HIGH (ref 78.0–100.0)
MONO ABS: 0 10*3/uL — AB (ref 0.1–1.0)
Monocytes Absolute: 0.2 10*3/uL (ref 0.1–1.0)
Monocytes Relative: 4 % (ref 3–12)
Monocytes Relative: 1 % — ABNORMAL LOW (ref 3–12)
NEUTROS ABS: 2.3 10*3/uL (ref 1.7–7.7)
NEUTROS PCT: 83 % — AB (ref 43–77)
Neutro Abs: 4.1 10*3/uL (ref 1.7–7.7)
Neutrophils Relative %: 86 % — ABNORMAL HIGH (ref 43–77)
PLATELETS: 178 10*3/uL (ref 150–400)
Platelets: 234 10*3/uL (ref 150–400)
RBC: 1.51 MIL/uL — ABNORMAL LOW (ref 3.87–5.11)
RBC: 2.66 MIL/uL — ABNORMAL LOW (ref 3.87–5.11)
RDW: 23.1 % — ABNORMAL HIGH (ref 11.5–15.5)
RDW: 22.9 % — ABNORMAL HIGH (ref 11.5–15.5)
WBC: 4.8 10*3/uL (ref 4.0–10.5)
WBC: 2.7 10*3/uL — ABNORMAL LOW (ref 4.0–10.5)

## 2015-02-12 LAB — BASIC METABOLIC PANEL
Anion gap: 5 (ref 5–15)
BUN: 25 mg/dL — ABNORMAL HIGH (ref 6–20)
CO2: 27 mmol/L (ref 22–32)
Calcium: 8.2 mg/dL — ABNORMAL LOW (ref 8.9–10.3)
Chloride: 108 mmol/L (ref 101–111)
Creatinine, Ser: 1.08 mg/dL — ABNORMAL HIGH (ref 0.44–1.00)
GFR calc Af Amer: 53 mL/min — ABNORMAL LOW (ref >60)
GFR calc non Af Amer: 46 mL/min — ABNORMAL LOW (ref >60)
Glucose, Bld: 93 mg/dL (ref 65–99)
Potassium: 4 mmol/L (ref 3.5–5.1)
Sodium: 140 mmol/L (ref 135–145)

## 2015-02-12 LAB — I-STAT CG4 LACTIC ACID, ED: Lactic Acid, Venous: 0.95 mmol/L (ref 0.5–2.0)

## 2015-02-12 LAB — GLUCOSE, CAPILLARY
Glucose-Capillary: 113 mg/dL — ABNORMAL HIGH (ref 65–99)
Glucose-Capillary: 92 mg/dL (ref 65–99)

## 2015-02-12 LAB — POC OCCULT BLOOD, ED: Fecal Occult Bld: POSITIVE — AB

## 2015-02-12 LAB — CBG MONITORING, ED
GLUCOSE-CAPILLARY: 90 mg/dL (ref 65–99)
Glucose-Capillary: 103 mg/dL — ABNORMAL HIGH (ref 65–99)

## 2015-02-12 LAB — PROTIME-INR
INR: 1.25 (ref 0.00–1.49)
PROTHROMBIN TIME: 15.9 s — AB (ref 11.6–15.2)

## 2015-02-12 MED ORDER — ATORVASTATIN CALCIUM 20 MG PO TABS
20.0000 mg | ORAL_TABLET | Freq: Every day | ORAL | Status: DC
  Administered 2015-02-12 – 2015-02-14 (×3): 20 mg via ORAL
  Filled 2015-02-12 (×3): qty 1

## 2015-02-12 MED ORDER — DIPHENHYDRAMINE HCL 25 MG PO CAPS
25.0000 mg | ORAL_CAPSULE | Freq: Once | ORAL | Status: AC
Start: 2015-02-12 — End: 2015-02-12
  Administered 2015-02-12: 25 mg via ORAL
  Filled 2015-02-12: qty 1

## 2015-02-12 MED ORDER — INSULIN ASPART 100 UNIT/ML ~~LOC~~ SOLN: 0.0000 [IU] | Freq: Three times a day (TID) | SUBCUTANEOUS | Status: DC

## 2015-02-12 MED ORDER — HEPARIN SODIUM (PORCINE) 5000 UNIT/ML IJ SOLN
5000.0000 [IU] | Freq: Three times a day (TID) | INTRAMUSCULAR | Status: DC
  Filled 2015-02-12 (×3): qty 1

## 2015-02-12 MED ORDER — ACETAMINOPHEN 325 MG PO TABS
650.0000 mg | ORAL_TABLET | Freq: Once | ORAL | Status: AC
  Administered 2015-02-12: 650 mg via ORAL
  Filled 2015-02-12: qty 2

## 2015-02-12 MED ORDER — SODIUM CHLORIDE 0.9 % IV SOLN
10.0000 mL/h | Freq: Once | INTRAVENOUS | Status: AC
  Administered 2015-02-12: 10 mL/h via INTRAVENOUS

## 2015-02-12 MED ORDER — LINAGLIPTIN 5 MG PO TABS
5.0000 mg | ORAL_TABLET | Freq: Every day | ORAL | Status: DC
  Administered 2015-02-12 – 2015-02-13 (×2): 5 mg via ORAL
  Filled 2015-02-12 (×2): qty 1

## 2015-02-12 MED ORDER — EPROSARTAN MESYLATE-HCTZ 600-12.5 MG PO TABS: 1.0000 | ORAL_TABLET | Freq: Every day | ORAL | Status: DC

## 2015-02-12 MED ORDER — CEFAZOLIN SODIUM-DEXTROSE 2-3 GM-% IV SOLR
2.0000 g | Freq: Three times a day (TID) | INTRAVENOUS | Status: DC
  Administered 2015-02-12 – 2015-02-14 (×6): 2 g via INTRAVENOUS
  Filled 2015-02-12 (×7): qty 50

## 2015-02-12 MED ORDER — FOLIC ACID 1 MG PO TABS
1.0000 mg | ORAL_TABLET | Freq: Every day | ORAL | Status: DC
Start: 2015-02-12 — End: 2015-02-14
  Administered 2015-02-12 – 2015-02-14 (×3): 1 mg via ORAL
  Filled 2015-02-12 (×4): qty 1

## 2015-02-12 MED ORDER — ALLOPURINOL 100 MG PO TABS
100.0000 mg | ORAL_TABLET | Freq: Every day | ORAL | Status: DC
  Administered 2015-02-12 – 2015-02-14 (×3): 100 mg via ORAL
  Filled 2015-02-12 (×3): qty 1

## 2015-02-12 MED ORDER — HYDROCODONE-ACETAMINOPHEN 5-325 MG PO TABS
1.0000 | ORAL_TABLET | ORAL | Status: DC | PRN
  Administered 2015-02-12 – 2015-02-14 (×9): 1 via ORAL
  Filled 2015-02-12 (×9): qty 1

## 2015-02-12 MED ORDER — HYDROCODONE-ACETAMINOPHEN 5-325 MG PO TABS
1.0000 | ORAL_TABLET | Freq: Once | ORAL | Status: AC
  Administered 2015-02-12: 1 via ORAL
  Filled 2015-02-12: qty 1

## 2015-02-12 MED ORDER — AMLODIPINE BESYLATE 10 MG PO TABS
10.0000 mg | ORAL_TABLET | Freq: Every day | ORAL | Status: DC
  Filled 2015-02-12: qty 1

## 2015-02-12 MED ORDER — INSULIN ASPART 100 UNIT/ML ~~LOC~~ SOLN: 0.0000 [IU] | Freq: Every day | SUBCUTANEOUS | Status: DC

## 2015-02-12 MED ORDER — IRBESARTAN 150 MG PO TABS
150.0000 mg | ORAL_TABLET | Freq: Every day | ORAL | Status: DC
  Administered 2015-02-13 – 2015-02-14 (×2): 150 mg via ORAL
  Filled 2015-02-12 (×3): qty 1

## 2015-02-12 MED ORDER — HYDROMORPHONE HCL 1 MG/ML IJ SOLN
1.0000 mg | Freq: Once | INTRAMUSCULAR | Status: AC
  Administered 2015-02-12: 1 mg via INTRAVENOUS
  Filled 2015-02-12: qty 1

## 2015-02-12 MED ORDER — SODIUM CHLORIDE 0.9 % IJ SOLN: 3.0000 mL | Freq: Two times a day (BID) | INTRAMUSCULAR | Status: DC

## 2015-02-12 MED ORDER — CEFAZOLIN SODIUM-DEXTROSE 2-3 GM-% IV SOLR: 2.0000 g | Freq: Once | INTRAVENOUS | Status: DC

## 2015-02-12 MED ORDER — MORPHINE SULFATE (PF) 2 MG/ML IV SOLN
2.0000 mg | INTRAVENOUS | Status: DC | PRN
  Administered 2015-02-12 – 2015-02-14 (×9): 2 mg via INTRAVENOUS
  Filled 2015-02-12 (×10): qty 1

## 2015-02-12 MED ORDER — SODIUM CHLORIDE 0.9 % IV SOLN
INTRAVENOUS | Status: DC
  Administered 2015-02-12: 19:00:00 via INTRAVENOUS

## 2015-02-12 MED ORDER — COLCHICINE 0.6 MG PO TABS
0.6000 mg | ORAL_TABLET | Freq: Two times a day (BID) | ORAL | Status: DC | PRN
  Administered 2015-02-12: 0.6 mg via ORAL
  Filled 2015-02-12 (×2): qty 1

## 2015-02-12 MED ORDER — SODIUM CHLORIDE 0.9 % IV SOLN
INTRAVENOUS | Status: DC
  Administered 2015-02-12: 23:00:00 via INTRAVENOUS

## 2015-02-12 MED ORDER — HYDROCHLOROTHIAZIDE 12.5 MG PO CAPS
12.5000 mg | ORAL_CAPSULE | Freq: Every day | ORAL | Status: DC
  Administered 2015-02-13 – 2015-02-14 (×2): 12.5 mg via ORAL
  Filled 2015-02-12 (×2): qty 1

## 2015-02-12 NOTE — ED Provider Notes (Signed)
Medical screening examination/treatment/procedure(s) were conducted as a shared visit with non-physician practitioner(s) and myself.  I personally evaluated the patient during the encounter.   EKG Interpretation None     Patient is here with draining blisters as 12 hours. Patient's labs reviewed and shows severe anemia. Will be transfused with packed red blood cells and admitted  Beverly Leigh, MD 02/12/15 1456

## 2015-02-12 NOTE — ED Notes (Signed)
Instructed to hold pt for consult.

## 2015-02-12 NOTE — ED Notes (Signed)
MD at bedside. 

## 2015-02-12 NOTE — ED Notes (Signed)
Bib ems c/o draining blisters to legs since 3am. Oncology pt receiving chemotherapy last week for lung CA

## 2015-02-12 NOTE — ED Provider Notes (Signed)
CSN: [id number]     Arrival date & time 02/12/15  1100 History   First MD Initiated Contact with Patient 02/12/15 1125     Chief Complaint  Patient presents with  . Blister     (Consider location/radiation/quality/duration/timing/severity/associated sxs/prior Treatment) HPI   Patient presents to the emergency department with blisters on bilateral lower extremities. She states that she noticed these blisters several days ago, they have been continuously draining clear to yellow fluid. They are painful to touch. She denies fever, chills, night sweats, fatigue, CP, weakness, dizziness, headache, blurred vision, back pain, neck pain, shortness of breath.   She also complains of a painful rash under bilateral breasts and beneath her stomach fat. She has been applying a powder (similar to anti athletes foot) to both areas.   Past Medical History  Diagnosis Date  . Hypertension   . Hyperlipidemia   . Cerebrovascular accident history of mild cerebrovascular accident which caused some visual disturbance.   . Diabetes mellitus   . Morbid obesity   . Lung mass     Right upper Lobe  . Shortness of breath   . Pneumonia     hx  . Peripheral vascular disease     hx blood clot 30 yrs ago leg  . GERD (gastroesophageal reflux disease)   . Arthritis   . Pneumothorax, iatrogenic 8/2/113-discharge note    Post Biospy  . Cough with hemoptysis 01/22/12    Prior to discharge  . Tachycardia 01/22/12    Prior to discharge  . History of radiation therapy 02/18/12,02/23/12,02/25/12., 03/01/12,&03/03/12    RULlung 50Gy/5/fx  . Lung cancer 01/21/12  . AAA (abdominal aortic aneurysm)    Past Surgical History  Procedure Laterality Date  . Abdominal aortic aneurysm repair  06/28/10    Stent Graft repair    . Exploratory laparotomy      For ectopic pregnancy  . Lumbar disc surgery    . Aortogram   03/03/11    with stenting , left external iliac artery  . Appendectomy    . Navigational bronchoscopy      Right  Upper Lobe Mass with Biopsies, Brushings, Washings, and bronchoalveolar Lavage:  . Paratracheal adenopathy and bilateral adrenal nodules     Family History  Problem Relation Age of Onset  . Heart disease Mother   . Hypertension Mother   . Diabetes Mother   . Hyperlipidemia Mother   . Heart disease Father   . Hypertension Father   . Diabetes Father   . Hyperlipidemia Father   . Diabetes Daughter    Social History  Substance Use Topics  . Smoking status: Current Every Day Smoker -- 0.50 packs/day for 65 years    Types: Cigarettes  . Smokeless tobacco: Never Used  . Alcohol Use: No     Comment: She does not consume alcohol on a regular basis.   OB History    No data available     Review of Systems   All other systems negative except as documented in the HPI. All pertinent positives and negatives as reviewed in the HPI.Marland Kitchen   Allergies  Asa buff (mag  Home Medications   Prior to Admission medications   Medication Sig Start Date End Date Taking? Authorizing Provider  acetaminophen (TYLENOL) 500 MG tablet Take 500 mg by mouth every 6 (six) hours as needed for moderate pain (inflammation).   Yes Historical Provider, MD  allopurinol (ZYLOPRIM) 100 MG tablet Take 100 mg by mouth daily.    Yes  Historical Provider, MD  aspirin EC 81 MG tablet Take 81 mg by mouth daily.   Yes Historical Provider, MD  atorvastatin (LIPITOR) 20 MG tablet Take 20 mg by mouth daily.     Yes Historical Provider, MD  cholecalciferol (VITAMIN D) 1000 UNITS tablet Take 1,000 Units by mouth daily.   Yes Historical Provider, MD  COLCRYS 0.6 MG tablet Take 0.6 mg by mouth 2 (two) times daily as needed (gout). For gout 11/03/11  Yes Historical Provider, MD  dexamethasone (DECADRON) 4 MG tablet Take 1 tablet by mouth the day before, the day of and the day after chemotherapy 01/23/15  Yes Curt Bears, MD  eprosartan-hydrochlorothiazide (TEVETEN HCT) 600-12.5 MG per tablet Take 1 tablet by mouth daily.    Yes  Historical Provider, MD  folic acid (FOLVITE) 1 MG tablet Take 1 mg by mouth daily.   Yes Historical Provider, MD  ibuprofen (ADVIL,MOTRIN) 200 MG tablet Take 800 mg by mouth every 6 (six) hours as needed for moderate pain.   Yes Historical Provider, MD  JANUVIA 100 MG tablet Take 100 mg by mouth daily.  03/11/11  Yes Historical Provider, MD  omeprazole (PRILOSEC) 20 MG capsule Take 20 mg by mouth daily.   Yes Historical Provider, MD  potassium chloride SA (K-DUR,KLOR-CON) 20 MEQ tablet Take 1 tablet (20 mEq total) by mouth daily. 01/02/15  Yes Adrena E Johnson, PA-C  amLODipine (NORVASC) 10 MG tablet Take 10 mg by mouth daily.      Historical Provider, MD  HYDROcodone-acetaminophen (NORCO/VICODIN) 5-325 MG per tablet Take 1 tablet by mouth daily as needed. 02/07/15   Historical Provider, MD  losartan-hydrochlorothiazide (HYZAAR) 100-12.5 MG per tablet Take 1 tablet by mouth daily.    Historical Provider, MD  nystatin (MYCOSTATIN) powder Apply topically 4 (four) times daily. Patient not taking: Reported on 02/12/2015 10/01/14   Debbe Odea, MD   BP 124/65 mmHg  Pulse 105  Temp(Src) 98.4 F (36.9 C) (Oral)  Resp 16  SpO2 98% Physical Exam  Constitutional: She is oriented to person, place, and time. She appears well-developed and well-nourished. No distress.  Obese female.  HENT:  Head: Normocephalic.  Eyes: Pupils are equal, round, and reactive to light.  Neck: Normal range of motion. Neck supple.  Cardiovascular: Normal rate, regular rhythm and normal heart sounds.  Exam reveals no gallop and no friction rub.   No murmur heard. Pulmonary/Chest: Effort normal and breath sounds normal. No respiratory distress.  Abdominal: Soft. Bowel sounds are normal. She exhibits no distension.  Neurological: She is alert and oriented to person, place, and time. She exhibits normal muscle tone. Coordination normal.  Skin: Skin is warm and dry. No rash noted.  Large blister located on the left lower shin  with clear drainage.  Large blister located on top of right foot and second and third toe.  Bilateral lower extremities with significant swelling and dry Skin.   Areas under bilateral breast suspicious for yeast infection. Skin is rubbed raw. Powder present.   Skin folds under abdominal fat is rubbed raw and suspicious for yeast infection. Powder present.   Psychiatric: She has a normal mood and affect.  Nursing note and vitals reviewed.   ED Course  Procedures (including critical care time) Labs Review Labs Reviewed  CBC WITH DIFFERENTIAL/PLATELET - Abnormal; Notable for the following:    RBC 1.51 (*)    Hemoglobin 5.0 (*)    HCT 16.0 (*)    MCV 106.0 (*)    RDW  23.1 (*)    Neutrophils Relative % 86 (*)    Lymphocytes Relative 10 (*)    Lymphs Abs 0.5 (*)    All other components within normal limits  BASIC METABOLIC PANEL - Abnormal; Notable for the following:    BUN 25 (*)    Creatinine, Ser 1.08 (*)    Calcium 8.2 (*)    GFR calc non Af Amer 46 (*)    GFR calc Af Amer 53 (*)    All other components within normal limits  COMPREHENSIVE METABOLIC PANEL - Abnormal; Notable for the following:    Calcium 8.2 (*)    Total Protein 6.3 (*)    Albumin 2.2 (*)    AST 42 (*)    All other components within normal limits  CBC - Abnormal; Notable for the following:    WBC 1.3 (*)    RBC 3.18 (*)    Hemoglobin 10.2 (*)    HCT 32.2 (*)    MCV 101.3 (*)    RDW 23.6 (*)    Platelets 139 (*)    All other components within normal limits  CBC WITH DIFFERENTIAL/PLATELET - Abnormal; Notable for the following:    WBC 2.7 (*)    RBC 2.66 (*)    Hemoglobin 9.0 (*)    HCT 28.5 (*)    MCV 107.1 (*)    RDW 22.9 (*)    Neutrophils Relative % 83 (*)    Monocytes Relative 1 (*)    Lymphs Abs 0.4 (*)    Monocytes Absolute 0.0 (*)    All other components within normal limits  PROTIME-INR - Abnormal; Notable for the following:    Prothrombin Time 15.9 (*)    All other components within  normal limits  GLUCOSE, CAPILLARY - Abnormal; Notable for the following:    Glucose-Capillary 113 (*)    All other components within normal limits  BASIC METABOLIC PANEL - Abnormal; Notable for the following:    Potassium 3.3 (*)    Calcium 8.2 (*)    GFR calc non Af Amer 56 (*)    All other components within normal limits  CBC - Abnormal; Notable for the following:    WBC 1.3 (*)    RBC 3.05 (*)    Hemoglobin 9.9 (*)    HCT 30.7 (*)    MCV 100.7 (*)    RDW 23.6 (*)    Platelets 124 (*)    All other components within normal limits  GLUCOSE, CAPILLARY - Abnormal; Notable for the following:    Glucose-Capillary 111 (*)    All other components within normal limits  CBG MONITORING, ED - Abnormal; Notable for the following:    Glucose-Capillary 103 (*)    All other components within normal limits  POC OCCULT BLOOD, ED - Abnormal; Notable for the following:    Fecal Occult Bld POSITIVE (*)    All other components within normal limits  WOUND CULTURE  CULTURE, BLOOD (ROUTINE X 2)  CULTURE, BLOOD (ROUTINE X 2)  GLUCOSE, CAPILLARY  GLUCOSE, CAPILLARY  GLUCOSE, CAPILLARY  GLUCOSE, CAPILLARY  GLUCOSE, CAPILLARY  GLUCOSE, CAPILLARY  GLUCOSE, CAPILLARY  GLUCOSE, CAPILLARY  I-STAT CG4 LACTIC ACID, ED  CBG MONITORING, ED  TYPE AND SCREEN  PREPARE RBC (CROSSMATCH)  PREPARE RBC (CROSSMATCH)    Imaging Review No results found. I have personally reviewed and evaluated these images and lab results as part of my medical decision-making.   EKG Interpretation None      I  spoke with orthopedics.  [facility] about the patient.  She has multiple issues that are causing a problem.  She will be admitted to the hospital  Dalia Heading, PA-C 02/14/15 1549  Lacretia Leigh, MD 02/14/15 [phone]

## 2015-02-12 NOTE — ED Notes (Signed)
Pt c/o increase in pain in right foot and ankle provider informed.

## 2015-02-12 NOTE — ED Notes (Signed)
PA/RN notified of abnormal lab

## 2015-02-12 NOTE — Progress Notes (Signed)
I was called by the the ED to see Beverly Lewis in regards to blisters on her legs.She is an 81 yo female with cancer who has been on chemo and developed leg blisters in past few days. No injuries or systemic symptoms such as fever or chills. She has several blisters, right foot, left foot left anterior lower leg. None appear infected. She has mild to moderate lower leg edema. There is no cellulitis. I do not see any Orthopaedic reason for these blisters. No need for intervention from our standpoint. Could be medication related or a dermatologic condition, but nothing within our scope. Will sign off

## 2015-02-12 NOTE — H&P (Addendum)
[facility] History and Physical  Beverly Lewis PTW:[id number] DOB: 1929-01-27 DOA: 02/12/2015  Referring physician: Emergency Department PCP: [name], MD  Specialists:   Chief Complaint: Leg pain, blisters  HPI: Beverly Lewis is a 81 y.o. female  With a hx of DM2, HTN, HLD, Stage 3 NSCLC followed by Dr. Julien Nordmann who initially presented to the ED with complaints of large blisters involving L shin and R foot x several days. No active drainage. Orthopedic Surgery was consulted.   Of note, on the day of pt's last chemo infusion on 8/17, it was noted that patient had increased pain and swelling to her LE. At that time, she was advised to take lasix.  Pt is a poor historian, of note. During work up, pt was noted to have hgb of 5.0. Pt denied bleeding. Blood transfusion was ordered through the ED  Given above, hospitalist service was consulted for consideration for admission.  Review of Systems:  Review of Systems  Constitutional: Negative for fever and chills.  HENT: Negative for ear discharge, ear pain and hearing loss.   Eyes: Negative for pain and discharge.  Respiratory: Negative for cough and shortness of breath.   Cardiovascular: Negative for chest pain and orthopnea.  Gastrointestinal: Negative for nausea, vomiting and abdominal pain.  Genitourinary: Negative for dysuria and flank pain.  Musculoskeletal: Negative for back pain and neck pain.  Skin: Positive for rash.       Blisters of R foot and L shin  Neurological: Negative for tingling, tremors and loss of consciousness.  Psychiatric/Behavioral: Negative for hallucinations and memory loss.     Past Medical History  Diagnosis Date  . Hypertension   . Hyperlipidemia   . Cerebrovascular accident history of mild cerebrovascular accident which caused some visual disturbance.   . Diabetes mellitus   . Morbid obesity   . Lung mass     Right upper Lobe  . Shortness of breath   . Pneumonia     hx  .  Peripheral vascular disease     hx blood clot 30 yrs ago leg  . GERD (gastroesophageal reflux disease)   . Arthritis   . Pneumothorax, iatrogenic 8/2/113-discharge note    Post Biospy  . Cough with hemoptysis 01/22/12    Prior to discharge  . Tachycardia 01/22/12    Prior to discharge  . History of radiation therapy 02/18/12,02/23/12,02/25/12., 03/01/12,&03/03/12    RULlung 50Gy/5/fx  . Lung cancer 01/21/12  . AAA (abdominal aortic aneurysm)    Past Surgical History  Procedure Laterality Date  . Abdominal aortic aneurysm repair  06/28/10    Stent Graft repair    . Exploratory laparotomy      For ectopic pregnancy  . Lumbar disc surgery    . Aortogram   03/03/11    with stenting , left external iliac artery  . Appendectomy    . Navigational bronchoscopy      Right Upper Lobe Mass with Biopsies, Brushings, Washings, and bronchoalveolar Lavage:  . Paratracheal adenopathy and bilateral adrenal nodules     Social History:  reports that she has been smoking Cigarettes.  She has a 32.5 pack-year smoking history. She has never used smokeless tobacco. She reports that she does not drink alcohol or use illicit drugs.  where does patient live--home, ALF, SNF? and with whom if at home?  Can patient participate in ADLs?  Allergies  Allergen Reactions  . Asa Buff (Mag [Buffered Aspirin]     In tolerance in higher  dosages.    Family History  Problem Relation Age of Onset  . Heart disease Mother   . Hypertension Mother   . Diabetes Mother   . Hyperlipidemia Mother   . Heart disease Father   . Hypertension Father   . Diabetes Father   . Hyperlipidemia Father   . Diabetes Daughter     (be sure to complete)  Prior to Admission medications   Medication Sig Start Date End Date Taking? Authorizing Provider  acetaminophen (TYLENOL) 500 MG tablet Take 500 mg by mouth every 6 (six) hours as needed for moderate pain (inflammation).   Yes Historical Provider, MD  allopurinol (ZYLOPRIM) 100 MG tablet  Take 100 mg by mouth daily.    Yes Historical Provider, MD  aspirin EC 81 MG tablet Take 81 mg by mouth daily.   Yes Historical Provider, MD  atorvastatin (LIPITOR) 20 MG tablet Take 20 mg by mouth daily.     Yes Historical Provider, MD  cholecalciferol (VITAMIN D) 1000 UNITS tablet Take 1,000 Units by mouth daily.   Yes Historical Provider, MD  COLCRYS 0.6 MG tablet Take 0.6 mg by mouth 2 (two) times daily as needed (gout). For gout 11/03/11  Yes Historical Provider, MD  dexamethasone (DECADRON) 4 MG tablet Take 1 tablet by mouth the day before, the day of and the day after chemotherapy 01/23/15  Yes Curt Bears, MD  eprosartan-hydrochlorothiazide (TEVETEN HCT) 600-12.5 MG per tablet Take 1 tablet by mouth daily.    Yes Historical Provider, MD  folic acid (FOLVITE) 1 MG tablet Take 1 mg by mouth daily.   Yes Historical Provider, MD  ibuprofen (ADVIL,MOTRIN) 200 MG tablet Take 800 mg by mouth every 6 (six) hours as needed for moderate pain.   Yes Historical Provider, MD  JANUVIA 100 MG tablet Take 100 mg by mouth daily.  03/11/11  Yes Historical Provider, MD  omeprazole (PRILOSEC) 20 MG capsule Take 20 mg by mouth daily.   Yes Historical Provider, MD  potassium chloride SA (K-DUR,KLOR-CON) 20 MEQ tablet Take 1 tablet (20 mEq total) by mouth daily. 01/02/15  Yes Adrena E Johnson, PA-C  amLODipine (NORVASC) 10 MG tablet Take 10 mg by mouth daily.      Historical Provider, MD  HYDROcodone-acetaminophen (NORCO/VICODIN) 5-325 MG per tablet Take 1 tablet by mouth daily as needed. 02/07/15   Historical Provider, MD  losartan-hydrochlorothiazide (HYZAAR) 100-12.5 MG per tablet Take 1 tablet by mouth daily.    Historical Provider, MD  nystatin (MYCOSTATIN) powder Apply topically 4 (four) times daily. Patient not taking: Reported on 02/12/2015 10/01/14   Debbe Odea, MD   Physical Exam: Filed Vitals:   02/12/15 1434 02/12/15 1609 02/12/15 1618 02/12/15 1642  BP:    136/64  Pulse: 100 117 110   Temp:       TempSrc:      Resp: '23 22 17 18  '$ SpO2: 92% 82% 94% 96%     General:  Awake, in nad  Eyes: PERRL B  ENT: membranes moist, dentition fair  Neck: trachea midline,neck supple  Cardiovascular: regular, s1, s2  Respiratory: normal resp effort, no wheezing  Abdomen: soft, nondistended  Skin: large closed bullae involving L shin and R dorsum of inner foot, tender to light palpation  Musculoskeletal: perfused, no clubbing  Psychiatric: mood/affect normal// no auditory/visual hallucinations  Neurologic: cn2-12 grossly intact, strength/sensation intact  Labs on Admission:  Basic Metabolic Panel:  Recent Labs Lab 02/06/15 0944 02/12/15 1403  NA 145 140  K 3.8  4.0  CL  --  108  CO2 22 27  GLUCOSE 97 93  BUN 14.1 25*  CREATININE 1.0 1.08*  CALCIUM 9.0 8.2*   Liver Function Tests:  Recent Labs Lab 02/06/15 0944  AST 23  ALT 20  ALKPHOS 160*  BILITOT 0.36  PROT 6.5  ALBUMIN 2.3*   No results for input(s): LIPASE, AMYLASE in the last 168 hours. No results for input(s): AMMONIA in the last 168 hours. CBC:  Recent Labs Lab 02/06/15 0944 02/12/15 1403  WBC 10.4* 4.8  NEUTROABS 8.6* 4.1  HGB 11.1* 5.0*  HCT 34.3* 16.0*  MCV 105.2* 106.0*  PLT 209 234   Cardiac Enzymes: No results for input(s): CKTOTAL, CKMB, CKMBINDEX, TROPONINI in the last 168 hours.  BNP (last 3 results) No results for input(s): BNP in the last 8760 hours.  ProBNP (last 3 results) No results for input(s): PROBNP in the last 8760 hours.  CBG:  Recent Labs Lab 02/12/15 1119  GLUCAP 103*    Radiological Exams on Admission: No results found.   Assessment/Plan Principal Problem:   Antineoplastic chemotherapy induced anemia Active Problems:   Non-small cell carcinoma of lung, stage 3   Failure to thrive in adult   Anemia   Blister of lower leg   Acute blood loss anemia  1. Anemia, suspect secondary to recent chemo 1. Pt is s/p chemo on 8/17. All blood counts excluding  plts are lower compared to prior CBC 2. Suspect chemo related pancytopenia 3. Stools heme pos. ED to consult GI 4. Transfuse prbc's as already ordered through ed 5. Follow cbc trends 2. Bullae 1. Markedly tender even to minimal palpation with erythema extending to the knee 2. Will start on empiric cefazolin to cover cellulitis. Check blood cultures 3. Edges of bullae over L shin could be extended with mild rubbing 4. ED consulted Orthopedic Surgery 3. NSCLC, stage 3 1. Followed by Dr. Julien Nordmann as outpatient. Will place on his list 2. Pt is s/p chemo infusion on 8/17 4. Failure to thrive 1. Will consult Nutrition for input 5. HTN 1. BP stable 2. Cont home meds 6. HLD 1. Cont home meds 2. Cont statin 7. DVT prophylaixs 1. Very limited options for prophylaxis 2. Will have to avoid anticoagulation given transfusion dependent anemia with heme pos stools. 3. Will avoid SCD's given marked tenderness and bullae of LE 4. When hgb stabilizes, would then consider subQ heparin given hypercoagulable state 8. Severe protein calorie malnutrition with failure to thrive 1. Consult nutrition  Code Status: Full  Family Communication: Pt in room  Disposition Plan: [place], [place] [facility] Pager [phone]  If 7PM-7AM, please contact night-coverage [email or web address] Password [email or web address] 02/12/2015, 5:07 PM

## 2015-02-12 NOTE — ED Notes (Signed)
Pt is refusing any more blood drawls

## 2015-02-13 ENCOUNTER — Ambulatory Visit: Payer: Medicare Other

## 2015-02-13 ENCOUNTER — Other Ambulatory Visit: Payer: Medicare Other

## 2015-02-13 ENCOUNTER — Ambulatory Visit: Payer: Medicare Other | Admitting: Physician Assistant

## 2015-02-13 DIAGNOSIS — L511 Stevens-Johnson syndrome: Secondary | ICD-10-CM | POA: Diagnosis present

## 2015-02-13 DIAGNOSIS — D6481 Anemia due to antineoplastic chemotherapy: Principal | ICD-10-CM

## 2015-02-13 DIAGNOSIS — T451X5A Adverse effect of antineoplastic and immunosuppressive drugs, initial encounter: Secondary | ICD-10-CM

## 2015-02-13 DIAGNOSIS — C3490 Malignant neoplasm of unspecified part of unspecified bronchus or lung: Secondary | ICD-10-CM

## 2015-02-13 LAB — COMPREHENSIVE METABOLIC PANEL
ALK PHOS: 115 U/L (ref 38–126)
ALT: 26 U/L (ref 14–54)
ANION GAP: 8 (ref 5–15)
AST: 42 U/L — ABNORMAL HIGH (ref 15–41)
Albumin: 2.2 g/dL — ABNORMAL LOW (ref 3.5–5.0)
BILIRUBIN TOTAL: 1.1 mg/dL (ref 0.3–1.2)
BUN: 19 mg/dL (ref 6–20)
CALCIUM: 8.2 mg/dL — AB (ref 8.9–10.3)
CO2: 26 mmol/L (ref 22–32)
Chloride: 106 mmol/L (ref 101–111)
Creatinine, Ser: 0.84 mg/dL (ref 0.44–1.00)
GFR calc Af Amer: >60 mL/min (ref >60)
Glucose, Bld: 85 mg/dL (ref 65–99)
POTASSIUM: 3.6 mmol/L (ref 3.5–5.1)
Sodium: 140 mmol/L (ref 135–145)
TOTAL PROTEIN: 6.3 g/dL — AB (ref 6.5–8.1)

## 2015-02-13 LAB — GLUCOSE, CAPILLARY
GLUCOSE-CAPILLARY: 68 mg/dL (ref 65–99)
GLUCOSE-CAPILLARY: 77 mg/dL (ref 65–99)
GLUCOSE-CAPILLARY: 85 mg/dL (ref 65–99)
GLUCOSE-CAPILLARY: 89 mg/dL (ref 65–99)
GLUCOSE-CAPILLARY: 89 mg/dL (ref 65–99)
Glucose-Capillary: 76 mg/dL (ref 65–99)

## 2015-02-13 LAB — CBC
HEMATOCRIT: 32.2 % — AB (ref 36.0–46.0)
HEMOGLOBIN: 10.2 g/dL — AB (ref 12.0–15.0)
MCH: 32.1 pg (ref 26.0–34.0)
MCHC: 31.7 g/dL (ref 30.0–36.0)
MCV: 101.3 fL — ABNORMAL HIGH (ref 78.0–100.0)
Platelets: 139 10*3/uL — ABNORMAL LOW (ref 150–400)
RBC: 3.18 MIL/uL — ABNORMAL LOW (ref 3.87–5.11)
RDW: 23.6 % — AB (ref 11.5–15.5)
WBC: 1.3 10*3/uL — AB (ref 4.0–10.5)

## 2015-02-13 MED ORDER — INSULIN ASPART 100 UNIT/ML ~~LOC~~ SOLN: 0.0000 [IU] | Freq: Every day | SUBCUTANEOUS | Status: DC

## 2015-02-13 MED ORDER — ACETAMINOPHEN 325 MG PO TABS
650.0000 mg | ORAL_TABLET | ORAL | Status: DC | PRN
  Administered 2015-02-13: 650 mg via ORAL
  Filled 2015-02-13: qty 2

## 2015-02-13 MED ORDER — INSULIN ASPART 100 UNIT/ML ~~LOC~~ SOLN: 0.0000 [IU] | Freq: Three times a day (TID) | SUBCUTANEOUS | Status: DC

## 2015-02-13 NOTE — Consult Note (Signed)
WOC wound consult note Reason for Consult:partial thickness skin loss in the sacral and buttocks areas due to moisture (IAD), blisters (partial thickness) on the bilateral LEs  Wound type:Moisture, venous insufficiency Pressure Ulcer POA: No Measurement:Blister on Left medial LE has ruptured to reveal a 8cm x 4cm x 0.2cm are of partial thickness skin injury.  Intact, serum filled blister on the dorsum on the right foot measures 3cm x 4cm. Scattered partial thickness tissue loss in the buttock and sacral areas with the largest open area measuring 2cm x 1.5cm x 0.2cm are resultant from IAD. Partial thickness tissue loss in the inframammary, inguinal and subpannicular areas is also realted to moisture, intertriginous dermatitis. Wound BWI:OMBTD, pink, moist Drainage (amount, consistency, odor) serous, no odor Periwound:dry, intact, macerated in sacral and buttocks areas Dressing procedure/placement/frequency: I will provide a mattress with low air loss feature to assist in moisture management, pain management and pressure redistribution.  Additionally, I will provide pressure redistribution heel boots. Topical care for the bilateral LEs will be conservative (xeroform gauze with dry gauze dressings to cover and secured with Kerlix/tape). In addition to turning and repositioning, the partial thickness areas of tissue loss will be treated with moisture barrier ointment, Critic Aid Clear. [place] nursing team will not follow, but will remain available to this patient, the nursing and medical teams.  Please re-consult if needed. Thanks, Maudie Flakes, MSN, RN, [place], [place], [place] [phone])

## 2015-02-13 NOTE — Progress Notes (Signed)
Initial Nutrition Assessment  DOCUMENTATION CODES:   Morbid obesity  INTERVENTION:  - Diet advancement as medically feasible - RD will continue to monitor for needs, including need for nutrition supplement  NUTRITION DIAGNOSIS:   Inadequate oral intake related to inability to eat as evidenced by NPO status.  GOAL:   Patient will meet greater than or equal to 90% of their needs  MONITOR:   Diet advancement, Weight trends, Labs, I & O's  REASON FOR ASSESSMENT:   Consult Assessment of nutrition requirement/status  ASSESSMENT:   81 year old with a hx of DM2, HTN, HLD, Stage 3 NSCLC followed by Dr. Julien Nordmann who initially presented to the ED with complaints of large blisters involving L shin and R foot x several days. No active drainage. Orthopedic Surgery was consulted.   Pt seen for consult. BMI indicates morbid obesity. Pt has been NPO and unable to meet needs. She reports that PTA she was not eating well at nursing home due to not liking the food available at the facility. She reports she did have a good appetite though and did not have trouble chewing or swallowing.   She indicates that recently she has been losing weight. She states she is unsure of UBW but that she was recently told she weighed 206 lbs which is less than she usually weighs. Per chart review, pt weighed 206 lbs in June and has gained 21 lbs since that time; noted that she has severe edema per RN notes. Will monitor weight during admission as weight fluctuations may be related to fluid weight.   Unable to determine if pt was drinking nutrition supplements PTA; will monitor for needs with diet advancement.  Pt refused physical assessment; she was covered to her chin in blankets so unable to visually assess. Medications reviewed. Labs reviewed; CBGs: 77-113 mg/dL, BUN/creatinine elevated, Ca: 8.2 mg/dL, GFR: 53.   Diet Order:  Diet NPO time specified Except for: Sips with Meds  Skin:  Stage 2 pressure ulcers to  bilateral buttocks and R thigh, wounds to breast and abdomen  Last BM:  8/22  Height:   Ht Readings from Last 1 Encounters:  02/12/15 '5\' 3"'$  (1.6 m)    Weight:   Wt Readings from Last 1 Encounters:  02/12/15 227 lb 15.3 oz (103.4 kg)    Ideal Body Weight:  52.27 kg (kg)  BMI:  Body mass index is 40.39 kg/(m^2).  Estimated Nutritional Needs:   Kcal:  2070-2270  Protein:  85-95 grams  Fluid:  1.5-1.8 L/day  EDUCATION NEEDS:   No education needs identified at this time      Jarome Matin, RD, LDN Inpatient Clinical Dietitian Pager # [phone] After hours/weekend pager # [phone]

## 2015-02-13 NOTE — Care Management Note (Signed)
Case Management Note  Patient Details  Name: JALENA VANDERLINDEN MRN: [id number] Date of Birth: 1928/09/21  Subjective/Objective:  81 y/o f admitted w/anemia.HI:DUPBDH chemo-NSCLC.From home.Will confirm if active w/[facility].                  Action/Plan:d/c plan home.   Expected Discharge Date:   (unknown)               Expected Discharge Plan:  [place]  In-House Referral:     Discharge planning Services  CM Consult  Post Acute Care Choice:  Home Health Choice offered to:     DME Arranged:    DME Agency:     HH Arranged:    HH Agency:     Status of Service:  In process, will continue to follow  Medicare Important Message Given:    Date Medicare IM Given:    Medicare IM give by:    Date Additional Medicare IM Given:    Additional Medicare Important Message give by:     If discussed at [place] of Stay Meetings, dates discussed:    Additional Comments:  Dessa Phi, RN 02/13/2015, 2:14 PM

## 2015-02-13 NOTE — Progress Notes (Signed)
Hypoglycemic Event  CBG: 68  Treatment: 15 GM carbohydrate snack  Symptoms: None  Follow-up CBG: Time:1308 CBG Result:89  Possible Reasons for Event: Inadequate meal intake  Comments/MD notified:none    Hassell Done, [facility]  Remember to initiate Hypoglycemia Order Set & complete

## 2015-02-13 NOTE — Progress Notes (Signed)
CRITICAL VALUE ALERT  Critical value received:  WBC 1.3  Date of notification:  02/13/15  Time of notification: 1028  Critical value read back:yes   Nurse who received alert:  Laurance Flatten, RN  MD notified (1st page):  [place] Pink  Time of first page:  1030  MD notified (2nd page):  Time of second page:  Responding MD:  [place] Pink  Time MD responded:  1100

## 2015-02-13 NOTE — Progress Notes (Signed)
PROGRESS NOTE  Beverly Lewis YCX:[id number] DOB: 1929/06/13 DOA: 02/12/2015 PCP: [name], MD  HPI/Recap of past 32 hours: 81 year old female with past medical history of diabetes mellitus and non-small cell carcinoma of the lung stage III on chemotherapy admitted on 8/23 with leg pain and one week of lower extremity pain and blistering plus also found to have significant anemia with a hemoglobin of 5. Patient transfused 2 units packed red blood cells. Hemoglobin following transfusion 9.5 and this morning up to 10. Patient himself complains of significant leg pain at area of blistering, very tender.  Assessment/Plan: Principal Problem:   Antineoplastic chemotherapy induced anemia: No evidence of GI bleed. Allowing by mouth. Continue to monitor hemoglobin. Status post 2 units packed red blood cells Active Problems:   Non-small cell carcinoma of lung, stage 3, receiving Alimta: In review of medication, can cause localized erythema multiforme, which is suspect this is most likely. Supportive treatment. Awaiting wound care evaluation. We'll also get physical therapy to see.   Failure to thrive in adult Morbid obesity: Patient meets criteria with BMI greater than 40 Diabetes mellitus: Appreciate diabetes educator assistance. Changed over to sliding scale, holding home meds. Essential hypertension: Stable  Code Status: Full code  Family Communication: Left message with family  Disposition Plan: At this but discharged within 24 hours, awaiting PT and wound care consults   Consultants:  Wound care  Procedures:  None  Antibiotics:  None   Objective: BP 151/71 mmHg  Pulse 115  Temp(Src) 99.3 F (37.4 C) (Oral)  Resp 20  Ht '5\' 3"'$  (1.6 m)  Wt 103.4 kg (227 lb 15.3 oz)  BMI 40.39 kg/m2  SpO2 87%  Intake/Output Summary (Last 24 hours) at 02/13/15 1458 Last data filed at 02/13/15 0720  Gross per 24 hour  Intake   1319 ml  Output      0 ml  Net   1319 ml   Filed  Weights   02/12/15 2007  Weight: 103.4 kg (227 lb 15.3 oz)    Exam:   General:  Alert and oriented 3, no acute distress  Cardiovascular: Regular rate and rhythm, S1-S2  Respiratory: Clear to auscultation bilaterally  Abdomen: Soft, obese, nontender, positive bowel sounds  Musculoskeletal: Posterior aspect of left lower extremity has ruptured large vesicular bulla, quite tender. Additionally also seen on the dorsal aspect of right foot and side medial   Data Reviewed: Basic Metabolic Panel:  Recent Labs Lab 02/12/15 1403 02/13/15 0955  NA 140 140  K 4.0 3.6  CL 108 106  CO2 27 26  GLUCOSE 93 85  BUN 25* 19  CREATININE 1.08* 0.84  CALCIUM 8.2* 8.2*   Liver Function Tests:  Recent Labs Lab 02/13/15 0955  AST 42*  ALT 26  ALKPHOS 115  BILITOT 1.1  PROT 6.3*  ALBUMIN 2.2*   No results for input(s): LIPASE, AMYLASE in the last 168 hours. No results for input(s): AMMONIA in the last 168 hours. CBC:  Recent Labs Lab 02/12/15 1403 02/12/15 2050 02/13/15 0955  WBC 4.8 2.7* 1.3*  NEUTROABS 4.1 2.3  --   HGB 5.0* 9.0* 10.2*  HCT 16.0* 28.5* 32.2*  MCV 106.0* 107.1* 101.3*  PLT 234 178 139*   Cardiac Enzymes:   No results for input(s): CKTOTAL, CKMB, CKMBINDEX, TROPONINI in the last 168 hours. BNP (last 3 results) No results for input(s): BNP in the last 8760 hours.  ProBNP (last 3 results) No results for input(s): PROBNP in the last 8760 hours.  CBG:  Recent Labs Lab 02/12/15 2204 02/12/15 2357 02/13/15 0756 02/13/15 1212 02/13/15 1308  GLUCAP 92 85 77 68 89    Recent Results (from the past 240 hour(s))  Wound culture     Status: None (Preliminary result)   Collection Time: 02/12/15  1:48 PM  Result Value Ref Range Status   Specimen Description LEG LEFT SHIN  Final   Special Requests NONE  Final   Gram Stain   Final    NO WBC SEEN NO SQUAMOUS EPITHELIAL CELLS SEEN NO ORGANISMS SEEN Performed at [facility]    Culture PENDING   Incomplete   Report Status PENDING  Incomplete     Studies: No results found.  Scheduled Meds: . allopurinol  100 mg Oral Daily  . atorvastatin  20 mg Oral Daily  .  ceFAZolin (ANCEF) IV  2 g Intravenous G6K  . folic acid  1 mg Oral Daily  . irbesartan  150 mg Oral Daily   And  . hydrochlorothiazide  12.5 mg Oral Daily  . insulin aspart  0-5 Units Subcutaneous QHS  . insulin aspart  0-9 Units Subcutaneous TID WC  . sodium chloride  3 mL Intravenous Q12H    Continuous Infusions: . sodium chloride 10 mL/hr at 02/12/15 2324     Time spent: 25 minutes  Annita Brod  [facility] Pager [phone]-7AM, please contact night-coverage at [email or web address], password [email or web address]/24/2016, 2:58 PM  LOS: 1 day

## 2015-02-13 NOTE — Progress Notes (Signed)
Patient refusing specialty mattress Barbee Shropshire. Brigitte Pulse, RN

## 2015-02-13 NOTE — Progress Notes (Signed)
MD notified of HR in 120's and patient c/o 8/10 pain despite prn pain medications given. Will continue to monitor.  Barbee Shropshire. Brigitte Pulse, RN

## 2015-02-13 NOTE — Progress Notes (Signed)
Inpatient Diabetes Program Recommendations  AACE/ADA: New Consensus Statement on Inpatient Glycemic Control (2013)  Target Ranges:  Prepandial:   less than 140 mg/dL      Peak postprandial:   less than 180 mg/dL (1-2 hours)      Critically ill patients:  140 - 180 mg/dL   Reason for Visit: Hypoglycemia  Diabetes history: DM2 Outpatient Diabetes medications: Januvia 100 mg QD Current orders for Inpatient glycemic control: Januvia 100 mg QD, Novolog moderate tidwc and hs  Results for Beverly Lewis, Beverly Lewis (MRN [id number]) as of 02/13/2015 12:38  Ref. Range 02/12/2015 19:06 02/12/2015 22:04 02/12/2015 23:57 02/13/2015 07:56 02/13/2015 12:12  Glucose-Capillary Latest Ref Range: 65-99 mg/dL 113 (H) 92 85 77 68   Recommendations: D/C OHAs - Januvia 100 mg QD Decrease Novolog to sensitive tidwc and hs  Will continue to follow. Thank you. Lorenda Peck, RD, LDN, CDE Inpatient Diabetes Coordinator [phone]

## 2015-02-14 LAB — BASIC METABOLIC PANEL
ANION GAP: 8 (ref 5–15)
BUN: 15 mg/dL (ref 6–20)
CO2: 25 mmol/L (ref 22–32)
Calcium: 8.2 mg/dL — ABNORMAL LOW (ref 8.9–10.3)
Chloride: 106 mmol/L (ref 101–111)
Creatinine, Ser: 0.92 mg/dL (ref 0.44–1.00)
GFR calc Af Amer: >60 mL/min (ref >60)
GFR calc non Af Amer: 56 mL/min — ABNORMAL LOW (ref >60)
GLUCOSE: 92 mg/dL (ref 65–99)
POTASSIUM: 3.3 mmol/L — AB (ref 3.5–5.1)
Sodium: 139 mmol/L (ref 135–145)

## 2015-02-14 LAB — TYPE AND SCREEN
ABO/RH(D): B POS
Antibody Screen: NEGATIVE
Unit division: 0
Unit division: 0

## 2015-02-14 LAB — CBC
HEMATOCRIT: 30.7 % — AB (ref 36.0–46.0)
HEMOGLOBIN: 9.9 g/dL — AB (ref 12.0–15.0)
MCH: 32.5 pg (ref 26.0–34.0)
MCHC: 32.2 g/dL (ref 30.0–36.0)
MCV: 100.7 fL — AB (ref 78.0–100.0)
Platelets: 124 10*3/uL — ABNORMAL LOW (ref 150–400)
RBC: 3.05 MIL/uL — ABNORMAL LOW (ref 3.87–5.11)
RDW: 23.6 % — ABNORMAL HIGH (ref 11.5–15.5)
WBC: 1.3 10*3/uL — CL (ref 4.0–10.5)

## 2015-02-14 LAB — GLUCOSE, CAPILLARY
Glucose-Capillary: 83 mg/dL (ref 65–99)
Glucose-Capillary: 111 mg/dL — ABNORMAL HIGH (ref 65–99)

## 2015-02-14 MED ORDER — METOPROLOL TARTRATE 25 MG PO TABS: 12.5000 mg | ORAL_TABLET | Freq: Two times a day (BID) | ORAL | Status: DC

## 2015-02-14 MED ORDER — OXYCODONE HCL 5 MG PO CAPS: 5.0000 mg | ORAL_CAPSULE | ORAL | Status: DC | PRN

## 2015-02-14 NOTE — Care Management Important Message (Signed)
Important Message  Patient Details  Name: LAIKEN SANDY MRN: [id number] Date of Birth: 1928-11-18   Medicare Important Message Given:  Yes-second notification given    Camillo Flaming 02/14/2015, 12:46 [place] Message  Patient Details  Name: NASYA VINCENT MRN: [id number] Date of Birth: [birth year]   Medicare Important Message Given:  Yes-second notification given    Camillo Flaming 02/14/2015, 12:46 PM

## 2015-02-14 NOTE — Evaluation (Signed)
Physical Therapy Evaluation Patient Details Name: Beverly Lewis MRN: [id number] DOB: 1928/11/11 Today's Date: 02/14/2015   History of Present Illness  With a hx of DM2, HTN, HLD, Stage 3 NSCLC followed by Dr. Julien Nordmann who initially presented to the ED 02/12/15 with complaints of large blisters involving L shin and R foot x several days. Hemaglobin 5. recent chemotherapy. L humerus  fx  Clinical Impression  Patient  Is in severe pain of legs, unable to mobilize to edge of the bed. Barely able to move the legs on the bed. Patient crying out with attempts. Very limited evaluation. No caregivers present to learn of prior level of function. PT will provide a trial of therapy to address problems listed in note below to address problems listed in note below if patient is able to tolerate.     Follow Up Recommendations SNF;Supervision/Assistance - 24 hour    Equipment Recommendations  None recommended by PT    Recommendations for Other Services       Precautions / Restrictions Precautions Precautions: Fall Precaution Comments: severe pain of legs.      Mobility  Bed Mobility Overal bed mobility: Needs Assistance;+ 2 for safety/equipment             General bed mobility comments: attempted  repositioning trunk  and legs in bed but too painful to move.  Transfers                 General transfer comment: unable  Ambulation/Gait                Stairs            Wheelchair Mobility    Modified Rankin (Stroke Patients Only)       Balance                                             Pertinent Vitals/Pain Pain Assessment: Faces Faces Pain Scale: Hurts worst Pain Descriptors / Indicators: Grimacing;Guarding;Crying;Tender Pain Intervention(s): Limited activity within patient's tolerance;Repositioned    Home Living Family/patient expects to be discharged to:: Unsure Living Arrangements: Other relatives                Additional Comments: patient states grandsons assist.with transfers, uses a WC. No family presnet.    Prior Function                 Hand Dominance        Extremity/Trunk Assessment   Upper Extremity Assessment: Generalized weakness           Lower Extremity Assessment: RLE deficits/detail;LLE deficits/detail RLE Deficits / Details: unable to lift legs, unable to touch legs, in PRAFO LLE Deficits / Details: same     Communication   Communication: No difficulties  Cognition Arousal/Alertness: Awake/alert Behavior During Therapy: WFL for tasks assessed/performed Overall Cognitive Status: No family/caregiver present to determine baseline cognitive functioning                      General Comments      Exercises        Assessment/Plan    PT Assessment Patient needs continued PT services  PT Diagnosis Generalized weakness;Acute pain   PT Problem List Decreased strength;Decreased range of motion;Decreased activity tolerance;Pain  PT Treatment Interventions Functional mobility training;Therapeutic activities;Therapeutic exercise   PT Goals (Current goals can be found  in the Care Plan section) Acute Rehab PT Goals Patient Stated Goal: not stated PT Goal Formulation: Patient unable to participate in goal setting Time For Goal Achievement: 02/28/15 Potential to Achieve Goals: Poor    Frequency Min 2X/week   Barriers to discharge Decreased caregiver support      Co-evaluation               End of Session   Activity Tolerance: Patient limited by pain Patient left: in bed;with bed alarm set;with call bell/phone within reach           Time: 0811-0821 PT Time Calculation (min) (ACUTE ONLY): 10 min   Charges:   PT Evaluation $Initial PT Evaluation Tier I: 1 Procedure     PT G CodesClaretha Cooper 02/14/2015, 1:25 PM Tresa Endo PT [phone]

## 2015-02-14 NOTE — Progress Notes (Signed)
Completed D/C teaching. Answered all questions. Patient will be transported home via [place]. Pt is in stable condition.

## 2015-02-14 NOTE — Progress Notes (Signed)
CSW reviewed PT evaluation recommending SNF at discharge - per [facility], Juliann Pulse & son, Beverly Lewis - patient is declining SNF at this time and plans to return home with home care. CSW provided patient's son with [facility] SNF list & FL2 incase plans change once home and she agrees with SNF.   CSW consulted for transportation needs. Patient will need non-emergency ambulance transport home. CSW confirmed home address with patient's son, Beverly Lewis. [facility] called for transport. No other CSW needs identified - CSW signing off.   Raynaldo Opitz, [facility] Clinical Social Worker cell #: [phone]

## 2015-02-14 NOTE — Care Management Note (Signed)
Case Management Note  Patient Details  Name: Beverly Lewis MRN: [id number] Date of Birth: [birth year]  Subjective/Objective: [facility] aware of d/c home w/HHC-HHRN/HHPT.                   Action/Plan:d/c home w/HHC.   Expected Discharge Date:   (unknown)               Expected Discharge Plan:  [place]  In-House Referral:     Discharge planning Services  CM Consult  Post Acute Care Choice:  Home Health Choice offered to:     DME Arranged:    DME Agency:     HH Arranged:  RN, PT [place] Agency:     Status of Service:  Completed, signed off  Medicare Important Message Given:    Date Medicare IM Given:    Medicare IM give by:    Date Additional Medicare IM Given:    Additional Medicare Important Message give by:     If discussed at [place] of Stay Meetings, dates discussed:    Additional Comments:  Dessa Phi, RN 02/14/2015, 12:16 PM

## 2015-02-14 NOTE — Discharge Summary (Signed)
Discharge Summary  Beverly Lewis EQA:[id number] DOB: 10-Apr-1929  PCP: [name], MD  Admit date: 02/12/2015 Discharge date: 02/14/2015  Time spent: 25 minutes  Recommendations for Outpatient Follow-up:  1. New medication: OxyIR 5 mg by mouth every 4 hours when necessary for pain 2. Patient being discharged with home health PT, RN for wound care, aide    Discharge Diagnoses:  Active Hospital Problems   Diagnosis Date Noted  . Erythema multiforme bullosum 02/13/2015  . Morbid obesity 02/13/2015  . Blister of lower leg 02/12/2015  . Pressure ulcer 02/12/2015  . Antineoplastic chemotherapy induced anemia 01/23/2015  . Failure to thrive in adult 09/28/2014  . Non-small cell carcinoma of lung, stage 3 01/21/2012    Resolved Hospital Problems   Diagnosis Date Noted Date Resolved  No resolved problems to display.    Discharge Condition: Improved, being discharged home  Diet recommendation: Low-sodium  Filed Weights   02/12/15 2007  Weight: 103.4 kg (227 lb 15.3 oz)    History of present illness:  81 year old female with past medical history of diabetes mellitus and non-small cell carcinoma of the lung stage III on chemotherapy admitted on 8/23 with leg pain and one week of lower extremity pain and blistering plus also found to have significant anemia with a hemoglobin of 5. Patient transfused 2 units packed red blood cells. She was then admitted to the hospitalists service  Hospital Course:  Principal Problem:   Non-small cell carcinoma of the lung, stage III on  alimta for chemotherapy.  In review of the literature, this can cause localized Erythema multiforme bullosum which is what I suspect her fluid-filled blistering lesions of her legs are. No signs of acute infection. Seen by wound care who recommended topical care for lower extremities with Xeroform gauze with dry gauze dressings and secured with Kerlix tape plus moisture barrier ointment and pressure  redistribution heel boots. Medicine for pain control. Will be followed by home health RN to ensure wounds are healing. Hopefully this will not recur.  Seen by physical therapy who recommended skilled nursing which patient declined. Had a discussion with the patient's son who is here from [place] for a week. He will stay with her along with full home health services. Patient does not progress, we'll make arrangements to have her go to rehabilitation facility from home Active Problems:    Antineoplastic chemotherapy induced anemia: No evidence of GI bleed. 5 transfusion, hemoglobin stayed at 10    Morbid obesity: Patient meets criteria with BMI greater than 40   Procedures:  Status post 2 units packed red blood cells transfused 8/23  Consultations:  None  Discharge Exam: BP 122/55 mmHg  Pulse 116  Temp(Src) 98.4 F (36.9 C) (Oral)  Resp 22  Ht '5\' 3"'$  (1.6 m)  Wt 103.4 kg (227 lb 15.3 oz)  BMI 40.39 kg/m2  SpO2 100%  General: Alert and oriented 3, no acute distress Cardiovascular: Regular rate and rhythm, S1-S2 Respiratory: Clear to auscultation bilaterally  Discharge Instructions You were cared for by a hospitalist during your hospital stay. If you have any questions about your discharge medications or the care you received while you were in the hospital after you are discharged, you can call the unit and asked to speak with the hospitalist on call if the hospitalist that took care of you is not available. Once you are discharged, your primary care physician will handle any further medical issues. Please note that NO REFILLS for any discharge medications will be authorized  once you are discharged, as it is imperative that you return to your primary care physician (or establish a relationship with a primary care physician if you do not have one) for your aftercare needs so that they can reassess your need for medications and monitor your lab values.  Discharge Instructions    Diet  - low sodium heart healthy    Complete by:  As directed      Increase activity slowly    Complete by:  As directed             Medication List    STOP taking these medications        amLODipine 10 MG tablet  Commonly known as:  NORVASC     eprosartan-hydrochlorothiazide 600-12.5 MG per tablet  Commonly known as:  TEVETEN HCT     losartan-hydrochlorothiazide 100-12.5 MG per tablet  Commonly known as:  HYZAAR     nystatin powder  Commonly known as:  MYCOSTATIN      TAKE these medications        acetaminophen 500 MG tablet  Commonly known as:  TYLENOL  Take 500 mg by mouth every 6 (six) hours as needed for moderate pain (inflammation).     allopurinol 100 MG tablet  Commonly known as:  ZYLOPRIM  Take 100 mg by mouth daily.     aspirin EC 81 MG tablet  Take 81 mg by mouth daily.     atorvastatin 20 MG tablet  Commonly known as:  LIPITOR  Take 20 mg by mouth daily.     cholecalciferol 1000 UNITS tablet  Commonly known as:  VITAMIN D  Take 1,000 Units by mouth daily.     COLCRYS 0.6 MG tablet  Generic drug:  colchicine  Take 0.6 mg by mouth 2 (two) times daily as needed (gout). For gout     dexamethasone 4 MG tablet  Commonly known as:  DECADRON  Take 1 tablet by mouth the day before, the day of and the day after chemotherapy     folic acid 1 MG tablet  Commonly known as:  FOLVITE  Take 1 mg by mouth daily.     HYDROcodone-acetaminophen 5-325 MG per tablet  Commonly known as:  NORCO/VICODIN  Take 1 tablet by mouth daily as needed.     ibuprofen 200 MG tablet  Commonly known as:  ADVIL,MOTRIN  Take 800 mg by mouth every 6 (six) hours as needed for moderate pain.     JANUVIA 100 MG tablet  Generic drug:  sitaGLIPtin  Take 100 mg by mouth daily.     metoprolol tartrate 25 MG tablet  Commonly known as:  LOPRESSOR  Take 0.5 tablets (12.5 mg total) by mouth 2 (two) times daily.     omeprazole 20 MG capsule  Commonly known as:  PRILOSEC  Take 20 mg by  mouth daily.     oxycodone 5 MG capsule  Commonly known as:  OXY-IR  Take 1 capsule (5 mg total) by mouth every 4 (four) hours as needed.     potassium chloride SA 20 MEQ tablet  Commonly known as:  K-DUR,KLOR-CON  Take 1 tablet (20 mEq total) by mouth daily.       Allergies  Allergen Reactions  . Asa Buff (Mag [Buffered Aspirin]     In tolerance in higher dosages.       Follow-up Information    Follow up with AVBUERE,EDWIN A, MD In 1 month.   Specialty:  Internal Medicine  Contact information:   [place] [address] [phone]       Follow up with [facility].   Why:  HHRN/HHPT/HH Nurse's aide/HH Brewing technologist information:   [address] [place] [address] [phone]       Go to [name], MD.   Specialty:  Internal Medicine   Why:  Sept.29th '@10'$ :30 am at [facility] information:   [address] [place]  [address] [phone]        The results of significant diagnostics from this hospitalization (including imaging, microbiology, ancillary and laboratory) are listed below for reference.    Significant Diagnostic Studies: No results found.  Microbiology: Recent Results (from the past 240 hour(s))  Wound culture     Status: None (Preliminary result)   Collection Time: 02/12/15  1:48 PM  Result Value Ref Range Status   Specimen Description LEG LEFT SHIN  Final   Special Requests NONE  Final   Gram Stain   Final    NO WBC SEEN NO SQUAMOUS EPITHELIAL CELLS SEEN NO ORGANISMS SEEN Performed at [facility]    Culture   Final    Culture reincubated for better growth Performed at [facility]    Report Status PENDING  Incomplete  Culture, blood (routine x 2)     Status: None (Preliminary result)   Collection Time: 02/12/15  5:47 PM  Result Value Ref Range Status   Specimen Description BLOOD LEFT ANTECUBITAL  Final   Special Requests BOTTLES  DRAWN AEROBIC AND ANAEROBIC 4CC  Final   Culture   Final    NO GROWTH 2 DAYS Performed at [facility]    Report Status PENDING  Incomplete  Culture, blood (routine x 2)     Status: None (Preliminary result)   Collection Time: 02/12/15  8:50 PM  Result Value Ref Range Status   Specimen Description BLOOD RIGHT ANTECUBITAL  Final   Special Requests BOTTLES DRAWN AEROBIC ONLY 5ML  Final   Culture   Final    NO GROWTH 1 DAY Performed at [facility]    Report Status PENDING  Incomplete     Labs: Basic Metabolic Panel:  Recent Labs Lab 02/12/15 1403 02/13/15 0955 02/14/15 0433  NA 140 140 139  K 4.0 3.6 3.3*  CL 108 106 106  CO2 '27 26 25  '$ GLUCOSE 93 85 92  BUN 25* 19 15  CREATININE 1.08* 0.84 0.92  CALCIUM 8.2* 8.2* 8.2*   Liver Function Tests:  Recent Labs Lab 02/13/15 0955  AST 42*  ALT 26  ALKPHOS 115  BILITOT 1.1  PROT 6.3*  ALBUMIN 2.2*   No results for input(s): LIPASE, AMYLASE in the last 168 hours. No results for input(s): AMMONIA in the last 168 hours. CBC:  Recent Labs Lab 02/12/15 1403 02/12/15 2050 02/13/15 0955 02/14/15 0433  WBC 4.8 2.7* 1.3* 1.3*  NEUTROABS 4.1 2.3  --   --   HGB 5.0* 9.0* 10.2* 9.9*  HCT 16.0* 28.5* 32.2* 30.7*  MCV 106.0* 107.1* 101.3* 100.7*  PLT 234 178 139* 124*   Cardiac Enzymes: No results for input(s): CKTOTAL, CKMB, CKMBINDEX, TROPONINI in the last 168 hours. BNP: BNP (last 3 results) No results for input(s): BNP in the last 8760 hours.  ProBNP (last 3 results) No results for input(s): PROBNP in the last 8760 hours.  CBG:  Recent Labs Lab 02/13/15 1308 02/13/15 1745 02/13/15 2119 02/14/15 2876  02/14/15 1131  GLUCAP 89 76 89 83 111*       Signed:  Marquia Costello K  [facility] 02/14/2015, 4:38 PM

## 2015-02-14 NOTE — Care Management Note (Signed)
Case Management Note  Patient Details  Name: Beverly Lewis MRN: [id number] Date of Birth: 13-May-1929  Subjective/Objective:  PT recc-SNF. Patient pleasantly declines SNF. Her son Richard-from out of [place] staying w/patient for 1-2 weeks) in rm also trying to convince patient of need to consider SNF, she still declines SNF. [facility] rep Lecretia aware of resumption of HHC, added HH Nurse's aide, & social worker(to transition to SNF if needed). CSW to provide ambulance transp.Nsg aware.                   Action/Plan:d/c home w/HHC.   Expected Discharge Date:   (unknown)               Expected Discharge Plan:  [place]  In-House Referral:  Clinical Social Work  Discharge planning Services  CM Consult  Post Acute Care Choice:  Home Health Choice offered to:     DME Arranged:    DME Agency:     HH Arranged:  RN, PT, Nurse's Aide (Education officer, museum) [place]:     Status of Service:  Completed, signed off  Medicare Important Message Given:  Yes-second notification given Date Medicare IM Given:    Medicare IM give by:    Date Additional Medicare IM Given:    Additional Medicare Important Message give by:     If discussed at [place] of Stay Meetings, dates discussed:    Additional Comments:  Dessa Phi, RN 02/14/2015, 2:02 PM

## 2015-02-15 ENCOUNTER — Telehealth: Payer: Self-pay | Admitting: *Deleted

## 2015-02-15 NOTE — Telephone Encounter (Signed)
Patient family member called stating that she recently spit up blood. Family member does not know the amount or how often the patient is spitting up blood. Instructed family member to watch for frequent bloody coughs/amount and if it becomes excessive they should take her to the ED/Urgent Care. Family member verbalized understanding.

## 2015-02-16 LAB — WOUND CULTURE: Gram Stain: NONE SEEN

## 2015-02-17 LAB — CULTURE, BLOOD (ROUTINE X 2): Culture: NO GROWTH

## 2015-02-18 ENCOUNTER — Non-Acute Institutional Stay (SKILLED_NURSING_FACILITY): Payer: Medicare Other | Admitting: Adult Health

## 2015-02-18 DIAGNOSIS — K219 Gastro-esophageal reflux disease without esophagitis: Secondary | ICD-10-CM | POA: Diagnosis not present

## 2015-02-18 DIAGNOSIS — I10 Essential (primary) hypertension: Secondary | ICD-10-CM | POA: Diagnosis not present

## 2015-02-18 DIAGNOSIS — E1159 Type 2 diabetes mellitus with other circulatory complications: Secondary | ICD-10-CM | POA: Diagnosis not present

## 2015-02-18 DIAGNOSIS — M1A49X0 Other secondary chronic gout, multiple sites, without tophus (tophi): Secondary | ICD-10-CM | POA: Diagnosis not present

## 2015-02-18 DIAGNOSIS — E876 Hypokalemia: Secondary | ICD-10-CM | POA: Diagnosis not present

## 2015-02-18 DIAGNOSIS — R627 Adult failure to thrive: Secondary | ICD-10-CM

## 2015-02-18 DIAGNOSIS — E785 Hyperlipidemia, unspecified: Secondary | ICD-10-CM | POA: Diagnosis not present

## 2015-02-18 DIAGNOSIS — M1A09X0 Idiopathic chronic gout, multiple sites, without tophus (tophi): Secondary | ICD-10-CM | POA: Insufficient documentation

## 2015-02-18 DIAGNOSIS — L511 Stevens-Johnson syndrome: Secondary | ICD-10-CM | POA: Diagnosis not present

## 2015-02-18 DIAGNOSIS — T451X5A Adverse effect of antineoplastic and immunosuppressive drugs, initial encounter: Secondary | ICD-10-CM | POA: Diagnosis not present

## 2015-02-18 DIAGNOSIS — D6481 Anemia due to antineoplastic chemotherapy: Secondary | ICD-10-CM | POA: Diagnosis not present

## 2015-02-18 DIAGNOSIS — C3490 Malignant neoplasm of unspecified part of unspecified bronchus or lung: Secondary | ICD-10-CM

## 2015-02-18 DIAGNOSIS — E1151 Type 2 diabetes mellitus with diabetic peripheral angiopathy without gangrene: Secondary | ICD-10-CM

## 2015-02-18 LAB — CULTURE, BLOOD (ROUTINE X 2): Culture: NO GROWTH

## 2015-02-18 NOTE — Progress Notes (Signed)
Patient ID: Beverly Lewis, female   DOB: [birth year], 81 y.o.   MRN: [id number]    Facility: [facility]      Allergies  Allergen Reactions  . Asa Buff (Mag [Buffered Aspirin]     In tolerance in higher dosages.    Chief Complaint  Patient presents with  . Medical Management of Chronic Issues    HPI:  She has been hospitalized due to her stage III non-small cell lung cancer on chemotherapy. 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 Is being treated with alimta. Required blood transfusion for a hgb of 5.0. She is here for short term rehab with her goal to return back home.    Past Medical History  Diagnosis Date  . Hypertension   . Hyperlipidemia   . Cerebrovascular accident history of mild cerebrovascular accident which caused some visual disturbance.   . Diabetes mellitus   . Morbid obesity   . Lung mass     Right upper Lobe  . Shortness of breath   . Pneumonia     hx  . Peripheral vascular disease     hx blood clot 30 yrs ago leg  . GERD (gastroesophageal reflux disease)   . Arthritis   . Pneumothorax, iatrogenic 8/2/113-discharge note    Post Biospy  . Cough with hemoptysis 01/22/12    Prior to discharge  . Tachycardia 01/22/12    Prior to discharge  . History of radiation therapy 02/18/12,02/23/12,02/25/12., 03/01/12,&03/03/12    RULlung 50Gy/5/fx  . Lung cancer 01/21/12  . AAA (abdominal aortic aneurysm)     Past Surgical History  Procedure Laterality Date  . Abdominal aortic aneurysm repair  06/28/10    Stent Graft repair    . Exploratory laparotomy      For ectopic pregnancy  . Lumbar disc surgery    . Aortogram   03/03/11    with stenting , left external iliac artery  . Appendectomy    . Navigational bronchoscopy      Right Upper Lobe Mass with Biopsies, Brushings, Washings, and bronchoalveolar Lavage:  . Paratracheal adenopathy and bilateral adrenal nodules      VITAL SIGNS  Filed Vitals:   02/18/15 1146  BP: 137/87  Pulse: 72  Height: $Remove'5\' 3"'dOQEpPB$  (1.6 m)  Weight: 230 lb (104.327 kg)     Patient's Medications  New Prescriptions   No medications on file  Previous Medications   ACETAMINOPHEN (TYLENOL) 500 MG TABLET    Take 500 mg by mouth every 6 (six) hours as needed for moderate pain (inflammation).   ALLOPURINOL (ZYLOPRIM) 100 MG TABLET    Take 100 mg by mouth daily.    ASPIRIN EC 81 MG TABLET    Take 81 mg by mouth daily.   ATORVASTATIN (LIPITOR) 20 MG TABLET    Take 20 mg by mouth daily.     CHOLECALCIFEROL (VITAMIN D) 1000 UNITS TABLET    Take 1,000 Units by mouth daily.   COLCRYS 0.6 MG TABLET    Take 0.6 mg by mouth 2 (two) times daily as needed (gout). For gout   DEXAMETHASONE (DECADRON) 4 MG TABLET    Take 1 tablet by mouth the day before, the day of and the day after chemotherapy   FOLIC ACID (FOLVITE) 1 MG TABLET    Take 1 mg by mouth daily.   HYDROCODONE-ACETAMINOPHEN (NORCO/VICODIN) 5-325 MG PER TABLET    Take 1 tablet by mouth daily as needed.   IBUPROFEN (ADVIL,MOTRIN) 200  MG TABLET    Take 800 mg by mouth every 6 (six) hours as needed for moderate pain.   JANUVIA 100 MG TABLET    Take 100 mg by mouth daily.    METOPROLOL TARTRATE (LOPRESSOR) 25 MG TABLET    Take 0.5 tablets (12.5 mg total) by mouth 2 (two) times daily.   OMEPRAZOLE (PRILOSEC) 20 MG CAPSULE    Take 20 mg by mouth daily.   OXYCODONE (OXY-IR) 5 MG CAPSULE    Take 1 capsule (5 mg total) by mouth every 4 (four) hours as needed.   POTASSIUM CHLORIDE SA (K-DUR,KLOR-CON) 20 MEQ TABLET    Take 1 tablet (20 mEq total) by mouth daily.  Modified Medications   No medications on file  Discontinued Medications   No medications on file     SIGNIFICANT DIAGNOSTIC EXAMS  12-10-14: ct of chest: 1. Overall stable CT appearance of these large complex right upper lobe lung mass, likely a combination of tumor and radiation change. 2. Stable mediastinal lymph nodes. 3. No CT findings for metastatic pulmonary  disease. 4. Stable soft tissue thickening involving the anterior chest wall at the same level of the tumor and an abnormal anterior rib. This all likely radiation change. 5. Stable adrenal gland adenomas.    LABS REVIEWED:   02-06-15: wbc 10.4; hgb 11.1; hct 34.3; mcv 105.2; plt 203; glucose 97; bun 14.1; creat 1.0; k+ 3.8; na++145; alk phos 160; albumin 2.3 02-12-15: wbc 4.8; hgb 5.0; hct 16.0; mcv 106; plt 234; glucose 93; bun 25;creat 1.08; k+4.0; na++140 02-13-15: wbc 1.3 hgb 10.2; hct 32.2; mcv 101.3; plt 139; glucose 85; bun 19; creat 0.84; k+3.6; na++140; ast 42 alt 26; alk phos 115; albumin 2.2 02-14-15: wbc 1.3; hgb 9.9; hct 30.7; mcv 100.7; plt 124; glucose 92; bun 15; creat 0.92; k+3.3; na++139     Review of Systems  Constitutional: Negative for appetite change and fatigue.  HENT: Negative for congestion.   Respiratory: Negative for cough, chest tightness and shortness of breath.   Cardiovascular: Negative for chest pain, palpitations and leg swelling.  Gastrointestinal: Negative for nausea, abdominal pain, diarrhea and constipation.  Musculoskeletal:       Bilateral leg pain   Skin: Negative for pallor.       Bilateral lower extremity blisters   Neurological: Negative for dizziness.  Psychiatric/Behavioral: The patient is not nervous/anxious.       Physical Exam  Constitutional: No distress.  Obese   Eyes: Conjunctivae are normal.  Neck: Neck supple. No JVD present. No thyromegaly present.  Cardiovascular: Normal rate and regular rhythm.   Pedal pulses not palpable bilaterally   Respiratory: Effort normal and breath sounds normal. No respiratory distress. She has no wheezes.  GI: Soft. Bowel sounds are normal. She exhibits no distension. There is no tenderness.  Musculoskeletal: She exhibits no edema.  Able to move all extremities   Lymphadenopathy:    She has no cervical adenopathy.  Neurological: She is alert.  Skin: Skin is warm and dry. She is not diaphoretic.    Lower extremities discolored Has bilateral lower extremity blisters present.   Psychiatric: She has a normal mood and affect.     ASSESSMENT/ PLAN:  1. Stage III non-small lung cancer: is followed by oncology is on chemotherapy alimta every 3 weeks has had 6 cycles. Will continue to monitor her status.   2. Blisters to lower extremities; is erythema multiforme bullosum due to chemotherapy. Will continue wound care per facility protocol will change  her oxycodone to 5 mg every 6 hours routinely and and every 3 hours as needed and will monitor her status.   3.  Gout: no recent flare present; will continue allopurinol 100 mg daily and colchicine 0.6 mg twice daily as needed will monitor   4. Dyslipidemia: will continue lipitor 20 mg daily   5. Diabetes: will continue januvia 100 mg daily   6. Hypertension: will continue lopressor 12.5 mg twice daily asa 81 mg daily   7. Gerd: will continue prilosec daily   8. Hypokalemia: will continue k+ 20 meq daily will check bmp  9. FTT: her albumin is 2.2; her current weight is 230 pounds. Will continue therapy as directed to improve upon her gait; balance; and independence with her adl's and will monitor her status.   10. Antineoplastic induced anemia: her hgb is 9.9; is status post 2 unit prbc's will check cbc    Time spent with patient  50  minutes >50% time spent counseling; reviewing medical record; tests; labs; and developing future plan of care     Ok Edwards NP [facility]  Contact [phone] Monday through Friday 8am- 5pm  After hours call [phone]

## 2015-02-19 ENCOUNTER — Non-Acute Institutional Stay (SKILLED_NURSING_FACILITY): Payer: Medicare Other | Admitting: Internal Medicine

## 2015-02-19 ENCOUNTER — Encounter: Payer: Self-pay | Admitting: Internal Medicine

## 2015-02-19 DIAGNOSIS — T451X5A Adverse effect of antineoplastic and immunosuppressive drugs, initial encounter: Secondary | ICD-10-CM | POA: Diagnosis not present

## 2015-02-19 DIAGNOSIS — I10 Essential (primary) hypertension: Secondary | ICD-10-CM

## 2015-02-19 DIAGNOSIS — L511 Stevens-Johnson syndrome: Secondary | ICD-10-CM

## 2015-02-19 DIAGNOSIS — R627 Adult failure to thrive: Secondary | ICD-10-CM

## 2015-02-19 DIAGNOSIS — E876 Hypokalemia: Secondary | ICD-10-CM

## 2015-02-19 DIAGNOSIS — E1159 Type 2 diabetes mellitus with other circulatory complications: Secondary | ICD-10-CM | POA: Diagnosis not present

## 2015-02-19 DIAGNOSIS — M1A49X0 Other secondary chronic gout, multiple sites, without tophus (tophi): Secondary | ICD-10-CM

## 2015-02-19 DIAGNOSIS — E1151 Type 2 diabetes mellitus with diabetic peripheral angiopathy without gangrene: Secondary | ICD-10-CM

## 2015-02-19 DIAGNOSIS — D6481 Anemia due to antineoplastic chemotherapy: Secondary | ICD-10-CM | POA: Diagnosis not present

## 2015-02-19 DIAGNOSIS — E785 Hyperlipidemia, unspecified: Secondary | ICD-10-CM

## 2015-02-19 DIAGNOSIS — K219 Gastro-esophageal reflux disease without esophagitis: Secondary | ICD-10-CM | POA: Diagnosis not present

## 2015-02-19 DIAGNOSIS — C3490 Malignant neoplasm of unspecified part of unspecified bronchus or lung: Secondary | ICD-10-CM | POA: Diagnosis not present

## 2015-02-19 NOTE — Progress Notes (Signed)
Patient ID: Beverly Lewis, female   DOB: 1929/04/06, 81 y.o.   MRN: [id number]    HISTORY AND PHYSICAL   DATE: 02/19/15   Location:  [place] [facility] Service: SNF [phone])   Extended Emergency Contact Information Primary Emergency Contact: [place] Address: [address]          [place], [place] [address] Johnnette Litter of [place] Phone: [phone] Mobile Phone: [phone] Relation: Grandson  Advanced Directive information   FULL CODE   Chief Complaint  Patient presents with  . New Admit To SNF    HPI:  81 yo female seen today for admission into SNF following hospital stay for anemia due to chemotx, NSCLCA stage 3 currently receiving chemotx and is s/p XRT, FTT, EM with large leg blisters, gout, DM 2, HTN, hyperlipidemia, GERD, hypokalemia. She was transfused 2 units PRBCs for Hgb 5. D/c H/H 9.9/30.7. Albumin 2.2. Wound care addressed EM care. EM thought to be due to chemotx agent, Alimta. She presents to SNF for short term rehab  She c/o leg pain today. She takes Oxy IR. Denies CP, SOB or hemoptysis. She is a smoker. She has no other significant concerns. No nursing issues. No falls.  HTN- takes metoprolol. No HA or dizziness   PAD - takes ASA daily. She has a hx AAA that was repaired. Remote hx "blood clot" in her leg  DM - CBGs not checked. She takes [place]. No low BS reactions   Gout - no gout attacks. Takes allopurinol  Hyperlipidemia - stable on lipitor  Hypokalemia - stable on potassium supplement  GERD - stable on PPI tx   She takes several vitamins/supplements.  Past Medical History  Diagnosis Date  . Hypertension   . Hyperlipidemia   . Cerebrovascular accident history of mild cerebrovascular accident which caused some visual disturbance.   . Diabetes mellitus   . Morbid obesity   . Lung mass     Right upper Lobe  . Shortness of breath   . Pneumonia     hx  . Peripheral vascular disease     hx blood clot  30 yrs ago leg  . GERD (gastroesophageal reflux disease)   . Arthritis   . Pneumothorax, iatrogenic 8/2/113-discharge note    Post Biospy  . Cough with hemoptysis 01/22/12    Prior to discharge  . Tachycardia 01/22/12    Prior to discharge  . History of radiation therapy 02/18/12,02/23/12,02/25/12., 03/01/12,&03/03/12    RULlung 50Gy/5/fx  . Lung cancer 01/21/12  . AAA (abdominal aortic aneurysm)     Past Surgical History  Procedure Laterality Date  . Abdominal aortic aneurysm repair  06/28/10    Stent Graft repair    . Exploratory laparotomy      For ectopic pregnancy  . Lumbar disc surgery    . Aortogram   03/03/11    with stenting , left external iliac artery  . Appendectomy    . Navigational bronchoscopy      Right Upper Lobe Mass with Biopsies, Brushings, Washings, and bronchoalveolar Lavage:  . Paratracheal adenopathy and bilateral adrenal nodules      Patient Care Team: Nolene Ebbs, MD as PCP - General (Internal Medicine)  Social History   Social History  . Marital Status: Widowed    Spouse Name: N/A  . Number of Children: N/A  . Years of Education: N/A   Occupational History  . Not on file.   Social History Main Topics  .  Smoking status: Current Every Day Smoker -- 0.50 packs/day for 65 years    Types: Cigarettes  . Smokeless tobacco: Never Used  . Alcohol Use: No     Comment: She does not consume alcohol on a regular basis.  . Drug Use: No  . Sexual Activity: No   Other Topics Concern  . Not on file   Social History Narrative     reports that she has been smoking Cigarettes.  She has a 32.5 pack-year smoking history. She has never used smokeless tobacco. She reports that she does not drink alcohol or use illicit drugs.  Family History  Problem Relation Age of Onset  . Heart disease Mother   . Hypertension Mother   . Diabetes Mother   . Hyperlipidemia Mother   . Heart disease Father   . Hypertension Father   . Diabetes Father   . Hyperlipidemia  Father   . Diabetes Daughter    Family Status  Relation Status Death Age  . Mother Deceased 67    Heart disease  . Father Deceased 4    Heart disease    Immunization History  Administered Date(s) Administered  . Influenza,inj,Quad PF,36+ Mos 03/26/2014    Allergies  Allergen Reactions  . Asa Buff (Mag [Buffered Aspirin]     In tolerance in higher dosages.    Medications: Patient's Medications  New Prescriptions   No medications on file  Previous Medications   ACETAMINOPHEN (TYLENOL) 500 MG TABLET    Take 500 mg by mouth every 6 (six) hours as needed for moderate pain (inflammation).   ALLOPURINOL (ZYLOPRIM) 100 MG TABLET    Take 100 mg by mouth daily.    ASPIRIN EC 81 MG TABLET    Take 81 mg by mouth daily.   ATORVASTATIN (LIPITOR) 20 MG TABLET    Take 20 mg by mouth daily.     CHOLECALCIFEROL (VITAMIN D) 1000 UNITS TABLET    Take 1,000 Units by mouth daily.   COLCRYS 0.6 MG TABLET    Take 0.6 mg by mouth 2 (two) times daily as needed (gout). For gout   DEXAMETHASONE (DECADRON) 4 MG TABLET    Take 1 tablet by mouth the day before, the day of and the day after chemotherapy   FOLIC ACID (FOLVITE) 1 MG TABLET    Take 1 mg by mouth daily.   HYDROCODONE-ACETAMINOPHEN (NORCO/VICODIN) 5-325 MG PER TABLET    Take 1 tablet by mouth daily as needed.   IBUPROFEN (ADVIL,MOTRIN) 200 MG TABLET    Take 800 mg by mouth every 6 (six) hours as needed for moderate pain.   JANUVIA 100 MG TABLET    Take 100 mg by mouth daily.    METOPROLOL TARTRATE (LOPRESSOR) 25 MG TABLET    Take 0.5 tablets (12.5 mg total) by mouth 2 (two) times daily.   OMEPRAZOLE (PRILOSEC) 20 MG CAPSULE    Take 20 mg by mouth daily.   OXYCODONE (OXY-IR) 5 MG CAPSULE    Take 1 capsule (5 mg total) by mouth every 4 (four) hours as needed.   POTASSIUM CHLORIDE SA (K-DUR,KLOR-CON) 20 MEQ TABLET    Take 1 tablet (20 mEq total) by mouth daily.  Modified Medications   No medications on file  Discontinued Medications   No  medications on file    Review of Systems  Constitutional: Negative for fever, chills, diaphoresis, activity change, appetite change and fatigue.  HENT: Negative for ear pain, sore throat and trouble swallowing.   Eyes:  Negative for visual disturbance.  Respiratory: Negative for cough, chest tightness and shortness of breath.   Cardiovascular: Positive for leg swelling. Negative for chest pain and palpitations.  Gastrointestinal: Negative for nausea, vomiting, abdominal pain, diarrhea, constipation and blood in stool.  Genitourinary: Negative for dysuria.  Musculoskeletal: Positive for gait problem. Negative for arthralgias.  Skin: Positive for wound.  Neurological: Positive for weakness. Negative for dizziness, tremors, numbness and headaches.  Psychiatric/Behavioral: Negative for sleep disturbance. The patient is not nervous/anxious.     Filed Vitals:   02/19/15 1743  BP: 161/83  Pulse: 104  Temp: 99.1 F (37.3 C)  Weight: 230 lb (104.327 kg)  SpO2: 98%   Body mass index is 40.75 kg/(m^2).  Physical Exam  Constitutional: She is oriented to person, place, and time. She appears well-developed. No distress.  Frail appearing lying in bed in NAD.  HENT:  Mouth/Throat: Oropharynx is clear and moist. No oropharyngeal exudate.  Eyes: Pupils are equal, round, and reactive to light. No scleral icterus.  Neck: Neck supple. Carotid bruit is not present. No tracheal deviation present. No thyromegaly present.  Cardiovascular: Normal rate, regular rhythm, normal heart sounds and intact distal pulses.  Exam reveals no gallop and no friction rub.   No murmur heard. +1 pitting LE edema b/l. No calf TTP  Pulmonary/Chest: Effort normal. No stridor. No respiratory distress. She has decreased breath sounds (L>R). She has no wheezes. She has no rales.  Abdominal: Soft. Bowel sounds are normal. She exhibits no distension and no mass. There is no hepatosplenomegaly or hepatomegaly. There is no  tenderness. There is no rebound and no guarding.  Lymphadenopathy:    She has no cervical adenopathy.  Neurological: She is alert and oriented to person, place, and time.  Skin: Skin is warm and dry. Rash (b/l leg dresings c/d/i) noted.  Legs to feet are TTP  Psychiatric: She has a normal mood and affect. Her behavior is normal.     Labs reviewed: Admission on 02/12/2015, Discharged on 02/14/2015  Component Date Value Ref Range Status  . Glucose-Capillary 02/12/2015 103* 65 - 99 mg/dL Final  . WBC 02/12/2015 4.8  4.0 - 10.5 K/uL Final  . RBC 02/12/2015 1.51* 3.87 - 5.11 MIL/uL Final  . Hemoglobin 02/12/2015 5.0* 12.0 - 15.0 g/dL Final   Comment: CRITICAL RESULT CALLED TO, READ BACK BY AND VERIFIED WITH: REPEATED TO VERIFY S WEST RN BY A MORRIS MLS @ 3646 8.23.16   . HCT 02/12/2015 16.0* 36.0 - 46.0 % Final  . MCV 02/12/2015 106.0* 78.0 - 100.0 fL Final  . MCH 02/12/2015 33.1  26.0 - 34.0 pg Final  . MCHC 02/12/2015 31.3  30.0 - 36.0 g/dL Final  . RDW 02/12/2015 23.1* 11.5 - 15.5 % Final  . Platelets 02/12/2015 234  150 - 400 K/uL Final  . Neutrophils Relative % 02/12/2015 86* 43 - 77 % Final  . Lymphocytes Relative 02/12/2015 10* 12 - 46 % Final  . Monocytes Relative 02/12/2015 4  3 - 12 % Final  . Eosinophils Relative 02/12/2015 0  0 - 5 % Final  . Basophils Relative 02/12/2015 0  0 - 1 % Final  . Neutro Abs 02/12/2015 4.1  1.7 - 7.7 K/uL Final  . Lymphs Abs 02/12/2015 0.5* 0.7 - 4.0 K/uL Final  . Monocytes Absolute 02/12/2015 0.2  0.1 - 1.0 K/uL Final  . Eosinophils Absolute 02/12/2015 0.0  0.0 - 0.7 K/uL Final  . Basophils Absolute 02/12/2015 0.0  0.0 - 0.1  K/uL Final  . Smear Review 02/12/2015 LARGE PLATELETS PRESENT   Final  . Sodium 02/12/2015 140  135 - 145 mmol/L Final  . Potassium 02/12/2015 4.0  3.5 - 5.1 mmol/L Final  . Chloride 02/12/2015 108  101 - 111 mmol/L Final  . CO2 02/12/2015 27  22 - 32 mmol/L Final  . Glucose, Bld 02/12/2015 93  65 - 99 mg/dL Final  .  BUN 02/12/2015 25* 6 - 20 mg/dL Final  . Creatinine, Ser 02/12/2015 1.08* 0.44 - 1.00 mg/dL Final  . Calcium 02/12/2015 8.2* 8.9 - 10.3 mg/dL Final  . GFR calc non Af Amer 02/12/2015 46* >60 mL/min Final  . GFR calc Af Amer 02/12/2015 53* >60 mL/min Final   Comment: (NOTE) The eGFR has been calculated using the CKD EPI equation. This calculation has not been validated in all clinical situations. eGFR's persistently <60 mL/min signify possible Chronic Kidney Disease.   . Anion gap 02/12/2015 5  5 - 15 Final  . Specimen Description 02/12/2015 LEG LEFT SHIN   Final  . Special Requests 02/12/2015 NONE   Final  . Gram Stain 02/12/2015    Final                   Value:NO WBC SEEN NO SQUAMOUS EPITHELIAL CELLS SEEN NO ORGANISMS SEEN Performed at [facility]   . Culture 02/12/2015    Final                   Value:FEW STAPHYLOCOCCUS AUREUS Note: RIFAMPIN AND GENTAMICIN SHOULD NOT BE USED AS SINGLE DRUGS FOR TREATMENT OF STAPH INFECTIONS. This organism DOES NOT demonstrate inducible Clindamycin resistance in vitro. Performed at [facility]   . Report Status 02/12/2015 02/16/2015 FINAL   Final  . Organism ID, Bacteria 02/12/2015 STAPHYLOCOCCUS AUREUS   Final  . ABO/RH(D) 02/12/2015 B POS   Final  . Antibody Screen 02/12/2015 NEG   Final  . Lewis Expiration 02/12/2015 02/15/2015   Final  . Unit Number 02/12/2015 [id number]   Final  . Blood Component Type 02/12/2015 RED CELLS,LR   Final  . Unit division 02/12/2015 00   Final  . Status of Unit 02/12/2015 ISSUED,FINAL   Final  . Transfusion Status 02/12/2015 OK TO TRANSFUSE   Final  . Crossmatch Result 02/12/2015 Compatible   Final  . Unit Number 02/12/2015 [id number]   Final  . Blood Component Type 02/12/2015 RED CELLS,LR   Final  . Unit division 02/12/2015 00   Final  . Status of Unit 02/12/2015 ISSUED,FINAL   Final  . Transfusion Status 02/12/2015 OK TO TRANSFUSE   Final  . Crossmatch Result 02/12/2015 Compatible    Final  . Fecal Occult Bld 02/12/2015 POSITIVE* NEGATIVE Final  . Lactic Acid, Venous 02/12/2015 0.95  0.5 - 2.0 mmol/L Final  . Order Confirmation 02/12/2015 ORDER PROCESSED BY BLOOD BANK   Final  . Sodium 02/13/2015 140  135 - 145 mmol/L Final  . Potassium 02/13/2015 3.6  3.5 - 5.1 mmol/L Final  . Chloride 02/13/2015 106  101 - 111 mmol/L Final  . CO2 02/13/2015 26  22 - 32 mmol/L Final  . Glucose, Bld 02/13/2015 85  65 - 99 mg/dL Final  . BUN 02/13/2015 19  6 - 20 mg/dL Final  . Creatinine, Ser 02/13/2015 0.84  0.44 - 1.00 mg/dL Final  . Calcium 02/13/2015 8.2* 8.9 - 10.3 mg/dL Final  . Total Protein 02/13/2015 6.3* 6.5 - 8.1 g/dL Final  . Albumin 02/13/2015  2.2* 3.5 - 5.0 g/dL Final  . AST 02/13/2015 42* 15 - 41 U/L Final  . ALT 02/13/2015 26  14 - 54 U/L Final  . Alkaline Phosphatase 02/13/2015 115  38 - 126 U/L Final  . Total Bilirubin 02/13/2015 1.1  0.3 - 1.2 mg/dL Final  . GFR calc non Af Amer 02/13/2015 >60  >60 mL/min Final  . GFR calc Af Amer 02/13/2015 >60  >60 mL/min Final   Comment: (NOTE) The eGFR has been calculated using the CKD EPI equation. This calculation has not been validated in all clinical situations. eGFR's persistently <60 mL/min signify possible Chronic Kidney Disease.   . Anion gap 02/13/2015 8  5 - 15 Final  . WBC 02/13/2015 1.3* 4.0 - 10.5 K/uL Final   Comment: REPEATED TO VERIFY CRITICAL RESULT CALLED TO, READ BACK BY AND VERIFIED WITH: Dorothy[place] [address] ON 08.24.16 BY SHUEA   . RBC 02/13/2015 3.18* 3.87 - 5.11 MIL/uL Final  . Hemoglobin 02/13/2015 10.2* 12.0 - 15.0 g/dL Final  . HCT 02/13/2015 32.2* 36.0 - 46.0 % Final  . MCV 02/13/2015 101.3* 78.0 - 100.0 fL Final  . MCH 02/13/2015 32.1  26.0 - 34.0 pg Final  . MCHC 02/13/2015 31.7  30.0 - 36.0 g/dL Final  . RDW 02/13/2015 23.6* 11.5 - 15.5 % Final  . Platelets 02/13/2015 139* 150 - 400 K/uL Final  . Glucose-Capillary 02/12/2015 90  65 - 99 mg/dL Final  . Comment 1 02/12/2015 Notify RN    Final  . Specimen Description 02/12/2015 BLOOD LEFT ANTECUBITAL   Final  . Special Requests 02/12/2015 BOTTLES DRAWN AEROBIC AND ANAEROBIC 4CC   Final  . Culture 02/12/2015    Final                   Value:NO GROWTH 5 DAYS Performed at [facility]   . Report Status 02/12/2015 02/17/2015 FINAL   Final  . Specimen Description 02/12/2015 BLOOD RIGHT ANTECUBITAL   Final  . Special Requests 02/12/2015 BOTTLES DRAWN AEROBIC ONLY 5ML   Final  . Culture 02/12/2015    Final                   Value:NO GROWTH 5 DAYS Performed at [facility]   . Report Status 02/12/2015 02/18/2015 FINAL   Final  . WBC 02/12/2015 2.7* 4.0 - 10.5 K/uL Final  . RBC 02/12/2015 2.66* 3.87 - 5.11 MIL/uL Final  . Hemoglobin 02/12/2015 9.0* 12.0 - 15.0 g/dL Final   Comment: DELTA CHECK NOTED REPEATED TO VERIFY   . HCT 02/12/2015 28.5* 36.0 - 46.0 % Final  . MCV 02/12/2015 107.1* 78.0 - 100.0 fL Final  . MCH 02/12/2015 33.8  26.0 - 34.0 pg Final  . MCHC 02/12/2015 31.6  30.0 - 36.0 g/dL Final  . RDW 02/12/2015 22.9* 11.5 - 15.5 % Final  . Platelets 02/12/2015 178  150 - 400 K/uL Final  . Neutrophils Relative % 02/12/2015 83* 43 - 77 % Final  . Lymphocytes Relative 02/12/2015 15  12 - 46 % Final  . Monocytes Relative 02/12/2015 1* 3 - 12 % Final  . Eosinophils Relative 02/12/2015 1  0 - 5 % Final  . Basophils Relative 02/12/2015 0  0 - 1 % Final  . Neutro Abs 02/12/2015 2.3  1.7 - 7.7 K/uL Final  . Lymphs Abs 02/12/2015 0.4* 0.7 - 4.0 K/uL Final  . Monocytes Absolute 02/12/2015 0.0* 0.1 - 1.0 K/uL Final  .  Eosinophils Absolute 02/12/2015 0.0  0.0 - 0.7 K/uL Final  . Basophils Absolute 02/12/2015 0.0  0.0 - 0.1 K/uL Final  . Smear Review 02/12/2015 MORPHOLOGY UNREMARKABLE   Final  . Prothrombin Time 02/12/2015 15.9* 11.6 - 15.2 seconds Final  . INR 02/12/2015 1.25  0.00 - 1.49 Final  . Order Confirmation 02/12/2015 ORDER PROCESSED BY BLOOD BANK   Final  . Glucose-Capillary 02/12/2015 113* 65 - 99  mg/dL Final  . Comment 1 02/12/2015 Notify RN   Final  . Comment 2 02/12/2015 Document in Chart   Final  . Glucose-Capillary 02/12/2015 92  65 - 99 mg/dL Final  . Glucose-Capillary 02/12/2015 85  65 - 99 mg/dL Final  . Glucose-Capillary 02/13/2015 77  65 - 99 mg/dL Final  . Glucose-Capillary 02/13/2015 68  65 - 99 mg/dL Final  . Glucose-Capillary 02/13/2015 89  65 - 99 mg/dL Final  . Sodium 02/14/2015 139  135 - 145 mmol/L Final  . Potassium 02/14/2015 3.3* 3.5 - 5.1 mmol/L Final  . Chloride 02/14/2015 106  101 - 111 mmol/L Final  . CO2 02/14/2015 25  22 - 32 mmol/L Final  . Glucose, Bld 02/14/2015 92  65 - 99 mg/dL Final  . BUN 02/14/2015 15  6 - 20 mg/dL Final  . Creatinine, Ser 02/14/2015 0.92  0.44 - 1.00 mg/dL Final  . Calcium 02/14/2015 8.2* 8.9 - 10.3 mg/dL Final  . GFR calc non Af Amer 02/14/2015 56* >60 mL/min Final  . GFR calc Af Amer 02/14/2015 >60  >60 mL/min Final   Comment: (NOTE) The eGFR has been calculated using the CKD EPI equation. This calculation has not been validated in all clinical situations. eGFR's persistently <60 mL/min signify possible Chronic Kidney Disease.   . Anion gap 02/14/2015 8  5 - 15 Final  . WBC 02/14/2015 1.3* 4.0 - 10.5 K/uL Final   CRITICAL VALUE NOTED.  VALUE IS CONSISTENT WITH PREVIOUSLY REPORTED AND CALLED VALUE.  Marland Kitchen RBC 02/14/2015 3.05* 3.87 - 5.11 MIL/uL Final  . Hemoglobin 02/14/2015 9.9* 12.0 - 15.0 g/dL Final  . HCT 02/14/2015 30.7* 36.0 - 46.0 % Final  . MCV 02/14/2015 100.7* 78.0 - 100.0 fL Final  . MCH 02/14/2015 32.5  26.0 - 34.0 pg Final  . MCHC 02/14/2015 32.2  30.0 - 36.0 g/dL Final  . RDW 02/14/2015 23.6* 11.5 - 15.5 % Final  . Platelets 02/14/2015 124* 150 - 400 K/uL Final  . Glucose-Capillary 02/13/2015 76  65 - 99 mg/dL Final  . Glucose-Capillary 02/13/2015 89  65 - 99 mg/dL Final  . Glucose-Capillary 02/14/2015 83  65 - 99 mg/dL Final  . Glucose-Capillary 02/14/2015 111* 65 - 99 mg/dL Final  Appointment on 02/06/2015   Component Date Value Ref Range Status  . WBC 02/06/2015 10.4* 3.9 - 10.3 10e3/uL Final  . NEUT# 02/06/2015 8.6* 1.5 - 6.5 10e3/uL Final  . HGB 02/06/2015 11.1* 11.6 - 15.9 g/dL Final  . HCT 02/06/2015 34.3* 34.8 - 46.6 % Final  . Platelets 02/06/2015 209  145 - 400 10e3/uL Final  . MCV 02/06/2015 105.2* 79.5 - 101.0 fL Final  . MCH 02/06/2015 34.0  25.1 - 34.0 pg Final  . MCHC 02/06/2015 32.3  31.5 - 36.0 g/dL Final  . RBC 02/06/2015 3.26* 3.70 - 5.45 10e6/uL Final  . RDW 02/06/2015 26.8* 11.2 - 14.5 % Final  . lymph# 02/06/2015 0.8* 0.9 - 3.3 10e3/uL Final  . MONO# 02/06/2015 1.0* 0.1 - 0.9 10e3/uL Final  . Eosinophils Absolute 02/06/2015  0.0  0.0 - 0.5 10e3/uL Final  . Basophils Absolute 02/06/2015 0.0  0.0 - 0.1 10e3/uL Final  . NEUT% 02/06/2015 82.6* 38.4 - 76.8 % Final  . LYMPH% 02/06/2015 7.4* 14.0 - 49.7 % Final  . MONO% 02/06/2015 9.8  0.0 - 14.0 % Final  . EOS% 02/06/2015 0.1  0.0 - 7.0 % Final  . BASO% 02/06/2015 0.1  0.0 - 2.0 % Final  . Sodium 02/06/2015 145  136 - 145 mEq/L Final  . Potassium 02/06/2015 3.8  3.5 - 5.1 mEq/L Final  . Chloride 02/06/2015 114* 98 - 109 mEq/L Final  . CO2 02/06/2015 22  22 - 29 mEq/L Final  . Glucose 02/06/2015 97  70 - 140 mg/dl Final  . BUN 02/06/2015 14.1  7.0 - 26.0 mg/dL Final  . Creatinine 02/06/2015 1.0  0.6 - 1.1 mg/dL Final  . Total Bilirubin 02/06/2015 0.36  0.20 - 1.20 mg/dL Final  . Alkaline Phosphatase 02/06/2015 160* 40 - 150 U/L Final  . AST 02/06/2015 23  5 - 34 U/L Final  . ALT 02/06/2015 20  0 - 55 U/L Final  . Total Protein 02/06/2015 6.5  6.4 - 8.3 g/dL Final  . Albumin 02/06/2015 2.3* 3.5 - 5.0 g/dL Final  . Calcium 02/06/2015 9.0  8.4 - 10.4 mg/dL Final  . Anion Gap 02/06/2015 9  3 - 11 mEq/L Final  . EGFR 02/06/2015 62* >90 ml/min/1.73 m2 Final   eGFR is calculated using the CKD-EPI Creatinine Equation (2009)  Appointment on 01/23/2015  Component Date Value Ref Range Status  . WBC 01/23/2015 5.2  3.9 - 10.3  10e3/uL Final  . NEUT# 01/23/2015 4.2  1.5 - 6.5 10e3/uL Final  . HGB 01/23/2015 8.0* 11.6 - 15.9 g/dL Final  . HCT 01/23/2015 25.0* 34.8 - 46.6 % Final  . Platelets 01/23/2015 273  145 - 400 10e3/uL Final  . MCV 01/23/2015 105.9* 79.5 - 101.0 fL Final  . MCH 01/23/2015 33.9  25.1 - 34.0 pg Final  . MCHC 01/23/2015 32.0  31.5 - 36.0 g/dL Final  . RBC 01/23/2015 2.36* 3.70 - 5.45 10e6/uL Final  . RDW 01/23/2015 20.7* 11.2 - 14.5 % Final  . lymph# 01/23/2015 0.3* 0.9 - 3.3 10e3/uL Final  . MONO# 01/23/2015 0.6  0.1 - 0.9 10e3/uL Final  . Eosinophils Absolute 01/23/2015 0.1  0.0 - 0.5 10e3/uL Final  . Basophils Absolute 01/23/2015 0.0  0.0 - 0.1 10e3/uL Final  . NEUT% 01/23/2015 80.5* 38.4 - 76.8 % Final  . LYMPH% 01/23/2015 6.3* 14.0 - 49.7 % Final  . MONO% 01/23/2015 11.3  0.0 - 14.0 % Final  . EOS% 01/23/2015 1.7  0.0 - 7.0 % Final  . BASO% 01/23/2015 0.2  0.0 - 2.0 % Final  . Sodium 01/23/2015 140  136 - 145 mEq/L Final  . Potassium 01/23/2015 4.6  3.5 - 5.1 mEq/L Final  . Chloride 01/23/2015 109  98 - 109 mEq/L Final  . CO2 01/23/2015 22  22 - 29 mEq/L Final  . Glucose 01/23/2015 116  70 - 140 mg/dl Final  . BUN 01/23/2015 54.6* 7.0 - 26.0 mg/dL Final  . Creatinine 01/23/2015 1.9* 0.6 - 1.1 mg/dL Final  . Total Bilirubin 01/23/2015 0.39  0.20 - 1.20 mg/dL Final  . Alkaline Phosphatase 01/23/2015 182* 40 - 150 U/L Final  . AST 01/23/2015 20  5 - 34 U/L Final  . ALT 01/23/2015 17  0 - 55 U/L Final  . Total Protein 01/23/2015 6.2* 6.4 -  8.3 g/dL Final  . Albumin 01/23/2015 2.3* 3.5 - 5.0 g/dL Final  . Calcium 01/23/2015 8.1* 8.4 - 10.4 mg/dL Final  . Anion Gap 01/23/2015 10  3 - 11 mEq/L Final  . EGFR 01/23/2015 28* >90 ml/min/1.73 m2 Final   eGFR is calculated using the CKD-EPI Creatinine Equation (2009)  Hospital Outpatient Visit on 01/23/2015  Component Date Value Ref Range Status  . ABO/RH(D) 01/23/2015 B POS   Final  . Antibody Screen 01/23/2015 NEG   Final  . Lewis  Expiration 01/23/2015 01/26/2015   Final  . Unit Number 01/23/2015 [id number]   Final  . Blood Component Type 01/23/2015 RED CELLS,LR   Final  . Unit division 01/23/2015 00   Final  . Status of Unit 01/23/2015 ISSUED,FINAL   Final  . Transfusion Status 01/23/2015 OK TO TRANSFUSE   Final  . Crossmatch Result 01/23/2015 Compatible   Final  . Unit Number 01/23/2015 [id number]   Final  . Blood Component Type 01/23/2015 RED CELLS,LR   Final  . Unit division 01/23/2015 00   Final  . Status of Unit 01/23/2015 ISSUED,FINAL   Final  . Transfusion Status 01/23/2015 OK TO TRANSFUSE   Final  . Crossmatch Result 01/23/2015 Compatible   Final  . ABO/RH(D) 01/23/2015 B POS   Final  . Order Confirmation 01/24/2015 ORDER PROCESSED BY BLOOD BANK   Final  Appointment on 01/02/2015  Component Date Value Ref Range Status  . WBC 01/02/2015 4.8  3.9 - 10.3 10e3/uL Final  . NEUT# 01/02/2015 2.5  1.5 - 6.5 10e3/uL Final  . HGB 01/02/2015 10.7* 11.6 - 15.9 g/dL Final  . HCT 01/02/2015 33.1* 34.8 - 46.6 % Final  . Platelets 01/02/2015 329  145 - 400 10e3/uL Final  . MCV 01/02/2015 103.1* 79.5 - 101.0 fL Final  . MCH 01/02/2015 33.4  25.1 - 34.0 pg Final  . MCHC 01/02/2015 32.4  31.5 - 36.0 g/dL Final  . RBC 01/02/2015 3.21* 3.70 - 5.45 10e6/uL Final  . RDW 01/02/2015 23.6* 11.2 - 14.5 % Final  . lymph# 01/02/2015 1.3  0.9 - 3.3 10e3/uL Final  . MONO# 01/02/2015 0.9  0.1 - 0.9 10e3/uL Final  . Eosinophils Absolute 01/02/2015 0.1  0.0 - 0.5 10e3/uL Final  . Basophils Absolute 01/02/2015 0.0  0.0 - 0.1 10e3/uL Final  . NEUT% 01/02/2015 51.6  38.4 - 76.8 % Final  . LYMPH% 01/02/2015 27.2  14.0 - 49.7 % Final  . MONO% 01/02/2015 19.2* 0.0 - 14.0 % Final  . EOS% 01/02/2015 1.8  0.0 - 7.0 % Final  . BASO% 01/02/2015 0.2  0.0 - 2.0 % Final  . Sodium 01/02/2015 146* 136 - 145 mEq/L Final  . Potassium 01/02/2015 3.0* 3.5 - 5.1 mEq/L Final  . Chloride 01/02/2015 111* 98 - 109 mEq/L Final  . CO2 01/02/2015 24   22 - 29 mEq/L Final  . Glucose 01/02/2015 87  70 - 140 mg/dl Final  . BUN 01/02/2015 30.1* 7.0 - 26.0 mg/dL Final  . Creatinine 01/02/2015 1.1  0.6 - 1.1 mg/dL Final  . Total Bilirubin 01/02/2015 0.34  0.20 - 1.20 mg/dL Final  . Alkaline Phosphatase 01/02/2015 203* 40 - 150 U/L Final  . AST 01/02/2015 20  5 - 34 U/L Final  . ALT 01/02/2015 15  0 - 55 U/L Final  . Total Protein 01/02/2015 7.3  6.4 - 8.3 g/dL Final  . Albumin 01/02/2015 2.6* 3.5 - 5.0 g/dL Final  . Calcium 01/02/2015 9.2  8.4 -  10.4 mg/dL Final  . Anion Gap 01/02/2015 10  3 - 11 mEq/L Final  . EGFR 01/02/2015 56* >90 ml/min/1.73 m2 Final   eGFR is calculated using the CKD-EPI Creatinine Equation (2009)  Appointment on 12/12/2014  Component Date Value Ref Range Status  . WBC 12/12/2014 7.6  3.9 - 10.3 10e3/uL Final  . NEUT# 12/12/2014 4.6  1.5 - 6.5 10e3/uL Final  . HGB 12/12/2014 10.4* 11.6 - 15.9 g/dL Final  . HCT 12/12/2014 32.4* 34.8 - 46.6 % Final  . Platelets 12/12/2014 345  145 - 400 10e3/uL Final  . MCV 12/12/2014 99.7  79.5 - 101.0 fL Final  . MCH 12/12/2014 32.0  25.1 - 34.0 pg Final  . MCHC 12/12/2014 32.1  31.5 - 36.0 g/dL Final  . RBC 12/12/2014 3.25* 3.70 - 5.45 10e6/uL Final  . RDW 12/12/2014 21.2* 11.2 - 14.5 % Final  . lymph# 12/12/2014 1.6  0.9 - 3.3 10e3/uL Final  . MONO# 12/12/2014 1.2* 0.1 - 0.9 10e3/uL Final  . Eosinophils Absolute 12/12/2014 0.1  0.0 - 0.5 10e3/uL Final  . Basophils Absolute 12/12/2014 0.0  0.0 - 0.1 10e3/uL Final  . NEUT% 12/12/2014 61.4  38.4 - 76.8 % Final  . LYMPH% 12/12/2014 21.2  14.0 - 49.7 % Final  . MONO% 12/12/2014 15.2* 0.0 - 14.0 % Final  . EOS% 12/12/2014 1.7  0.0 - 7.0 % Final  . BASO% 12/12/2014 0.5  0.0 - 2.0 % Final  . Sodium 12/12/2014 144  136 - 145 mEq/L Final  . Potassium 12/12/2014 3.6  3.5 - 5.1 mEq/L Final  . Chloride 12/12/2014 108  98 - 109 mEq/L Final  . CO2 12/12/2014 25  22 - 29 mEq/L Final  . Glucose 12/12/2014 89  70 - 140 mg/dl Final  . BUN  12/12/2014 29.4* 7.0 - 26.0 mg/dL Final  . Creatinine 12/12/2014 1.6* 0.6 - 1.1 mg/dL Final  . Total Bilirubin 12/12/2014 0.60  0.20 - 1.20 mg/dL Final  . Alkaline Phosphatase 12/12/2014 232* 40 - 150 U/L Final  . AST 12/12/2014 42* 5 - 34 U/L Final  . ALT 12/12/2014 32  0 - 55 U/L Final  . Total Protein 12/12/2014 6.8  6.4 - 8.3 g/dL Final  . Albumin 12/12/2014 2.6* 3.5 - 5.0 g/dL Final  . Calcium 12/12/2014 8.9  8.4 - 10.4 mg/dL Final  . Anion Gap 12/12/2014 10  3 - 11 mEq/L Final  . EGFR 12/12/2014 34* >90 ml/min/1.73 m2 Final   eGFR is calculated using the CKD-EPI Creatinine Equation (2009)  Appointment on 11/21/2014  Component Date Value Ref Range Status  . WBC 11/21/2014 3.9  3.9 - 10.3 10e3/uL Final  . NEUT# 11/21/2014 3.2  1.5 - 6.5 10e3/uL Final  . HGB 11/21/2014 11.2* 11.6 - 15.9 g/dL Final  . HCT 11/21/2014 35.0  34.8 - 46.6 % Final  . Platelets 11/21/2014 368  145 - 400 10e3/uL Final  . MCV 11/21/2014 97.8  79.5 - 101.0 fL Final  . MCH 11/21/2014 31.3  25.1 - 34.0 pg Final  . MCHC 11/21/2014 32.0  31.5 - 36.0 g/dL Final  . RBC 11/21/2014 3.58* 3.70 - 5.45 10e6/uL Final  . RDW 11/21/2014 19.8* 11.2 - 14.5 % Final  . lymph# 11/21/2014 0.6* 0.9 - 3.3 10e3/uL Final  . MONO# 11/21/2014 0.1  0.1 - 0.9 10e3/uL Final  . Eosinophils Absolute 11/21/2014 0.0  0.0 - 0.5 10e3/uL Final  . Basophils Absolute 11/21/2014 0.0  0.0 - 0.1 10e3/uL Final  .  NEUT% 11/21/2014 82.2* 38.4 - 76.8 % Final  . LYMPH% 11/21/2014 15.0  14.0 - 49.7 % Final  . MONO% 11/21/2014 2.5  0.0 - 14.0 % Final  . EOS% 11/21/2014 0.0  0.0 - 7.0 % Final  . BASO% 11/21/2014 0.3  0.0 - 2.0 % Final  . Sodium 11/21/2014 142  136 - 145 mEq/L Final  . Potassium 11/21/2014 3.8  3.5 - 5.1 mEq/L Final  . Chloride 11/21/2014 112* 98 - 109 mEq/L Final  . CO2 11/21/2014 18* 22 - 29 mEq/L Final  . Glucose 11/21/2014 133  70 - 140 mg/dl Final  . BUN 11/21/2014 12.5  7.0 - 26.0 mg/dL Final  . Creatinine 11/21/2014 0.8  0.6 -  1.1 mg/dL Final  . Total Bilirubin 11/21/2014 0.31  0.20 - 1.20 mg/dL Final  . Alkaline Phosphatase 11/21/2014 185* 40 - 150 U/L Final  . AST 11/21/2014 24  5 - 34 U/L Final  . ALT 11/21/2014 15  0 - 55 U/L Final  . Total Protein 11/21/2014 6.8  6.4 - 8.3 g/dL Final  . Albumin 11/21/2014 2.3* 3.5 - 5.0 g/dL Final  . Calcium 11/21/2014 8.4  8.4 - 10.4 mg/dL Final  . Anion Gap 11/21/2014 12* 3 - 11 mEq/L Final  . EGFR 11/21/2014 85* >90 ml/min/1.73 m2 Final   eGFR is calculated using the CKD-EPI Creatinine Equation (2009)    No results found.   Assessment/Plan   ICD-9-CM ICD-10-CM   1. Essential hypertension, benign - suboptimally controlled 401.1 I10   2. Failure to thrive in adult - due to #3 and #6 783.7 R62.7   3. Non-small cell carcinoma of lung, stage 3, unspecified laterality 162.9 C34.90   4. 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 Type II diabetes mellitus with peripheral circulatory disorder - stable 250.70 E11.59    443.81    6. Antineoplastic chemotherapy induced anemia - stable 285.3 D64.81    E933.1 T45.1X5A   7. GERD without esophagitis - stable 530.81 K21.9   8. Dyslipidemia - stable 272.4 E78.5   9. Other secondary chronic gout of multiple sites without tophus - stable 274.02 [id number].49X0   10. Hypokalemia - stable 276.8 E87.6     --CBC w diff and BMP pending  --check BP every shift. May need to adjust BP med  --cont current meds as ordered  --PT/OT/ST as indicated  --wound care to leg blisters as ordered  --f/u with H/O as scheduled  --GOAL: short term rehab and d/c home when medically appropriate. Communicated with pt and nursing.  --will follow  Athena Baltz S. Perlie Gold  [facility] [address] [place], [place] [address] [phone] Cell (Monday-Friday 8 AM - 5 PM) [phone] After 5 PM and follow prompts

## 2015-03-04 ENCOUNTER — Non-Acute Institutional Stay (SKILLED_NURSING_FACILITY): Payer: Medicare Other | Admitting: Adult Health

## 2015-03-04 DIAGNOSIS — R627 Adult failure to thrive: Secondary | ICD-10-CM

## 2015-03-04 DIAGNOSIS — I10 Essential (primary) hypertension: Secondary | ICD-10-CM

## 2015-03-04 DIAGNOSIS — L511 Stevens-Johnson syndrome: Secondary | ICD-10-CM | POA: Diagnosis not present

## 2015-03-04 DIAGNOSIS — C3490 Malignant neoplasm of unspecified part of unspecified bronchus or lung: Secondary | ICD-10-CM | POA: Diagnosis not present

## 2015-03-04 DIAGNOSIS — E1151 Type 2 diabetes mellitus with diabetic peripheral angiopathy without gangrene: Secondary | ICD-10-CM

## 2015-03-04 DIAGNOSIS — E1159 Type 2 diabetes mellitus with other circulatory complications: Secondary | ICD-10-CM | POA: Diagnosis not present

## 2015-03-04 DIAGNOSIS — R600 Localized edema: Secondary | ICD-10-CM | POA: Diagnosis not present

## 2015-03-06 ENCOUNTER — Other Ambulatory Visit: Payer: Self-pay | Admitting: Medical Oncology

## 2015-03-06 DIAGNOSIS — C3490 Malignant neoplasm of unspecified part of unspecified bronchus or lung: Secondary | ICD-10-CM

## 2015-03-08 ENCOUNTER — Telehealth: Payer: Self-pay | Admitting: Hematology and Oncology

## 2015-03-08 NOTE — Telephone Encounter (Signed)
s.w. pt grandson and advised on appts...Marland Kitchenok and aware

## 2015-03-13 ENCOUNTER — Ambulatory Visit: Payer: Medicare Other | Admitting: Nurse Practitioner

## 2015-03-13 ENCOUNTER — Ambulatory Visit: Payer: Medicare Other | Admitting: Hematology and Oncology

## 2015-03-13 ENCOUNTER — Other Ambulatory Visit: Payer: Medicare Other

## 2015-04-26 ENCOUNTER — Encounter: Payer: Self-pay | Admitting: Adult Health

## 2015-04-26 NOTE — Progress Notes (Signed)
Patient ID: Beverly Lewis, female   DOB: [birth year], 81 y.o.   MRN: [id number]    Facility: [facility]      Allergies  Allergen Reactions  . Asa Buff (Mag [Buffered Aspirin]     In tolerance in higher dosages.    Chief Complaint  Patient presents with  . Discharge Note    HPI:  She is being discharged to home with home health for pt/ot/rn/st. Her family has arranged for her care and she will not need any dme; she has all needed dme. She will need her prescriptions written and will need to follow up with her pcp.     Past Medical History  Diagnosis Date  . Hypertension   . Hyperlipidemia   . Cerebrovascular accident [facility]) history of mild cerebrovascular accident which caused some visual disturbance.   . Diabetes mellitus   . Morbid obesity (HCC)   . Lung mass     Right upper Lobe  . Shortness of breath   . Pneumonia     hx  . Peripheral vascular disease (HCC)     hx blood clot 30 yrs ago leg  . GERD (gastroesophageal reflux disease)   . Arthritis   . Pneumothorax, iatrogenic 8/2/113-discharge note    Post Biospy  . Cough with hemoptysis 01/22/12    Prior to discharge  . Tachycardia 01/22/12    Prior to discharge  . History of radiation therapy 02/18/12,02/23/12,02/25/12., 03/01/12,&03/03/12    RULlung 50Gy/5/fx  . Lung cancer (HCC) 01/21/12  . AAA (abdominal aortic aneurysm) [facility])     Past Surgical History  Procedure Laterality Date  . Abdominal aortic aneurysm repair  06/28/10    Stent Graft repair    . Exploratory laparotomy      For ectopic pregnancy  . Lumbar disc surgery    . Aortogram   03/03/11    with stenting , left external iliac artery  . Appendectomy    . Navigational bronchoscopy      Right Upper Lobe Mass with Biopsies, Brushings, Washings, and bronchoalveolar Lavage:  . Paratracheal adenopathy and bilateral adrenal nodules      VITAL SIGNS BP 121/58 mmHg  Pulse 97  Ht 5\' 3"  (1.6 m)  Wt 210 lb 9.6 oz (95.528 kg)  BMI 37.32  kg/m2  SpO2 98%  Patient's Medications  New Prescriptions   No medications on file  Previous Medications   ACETAMINOPHEN (TYLENOL) 500 MG TABLET    Take 500 mg by mouth every 6 (six) hours as needed for moderate pain (inflammation).   ALLOPURINOL (ZYLOPRIM) 100 MG TABLET    Take 100 mg by mouth daily.    ASPIRIN EC 81 MG TABLET    Take 81 mg by mouth daily.   ATORVASTATIN (LIPITOR) 20 MG TABLET    Take 20 mg by mouth daily.     CHOLECALCIFEROL (VITAMIN D) 1000 UNITS TABLET    Take 1,000 Units by mouth daily.   COLCRYS 0.6 MG TABLET    Take 0.6 mg by mouth 2 (two) times daily as needed (gout). For gout   DEXAMETHASONE (DECADRON) 4 MG TABLET    Take 1 tablet by mouth the day before, the day of and the day after chemotherapy   FOLIC ACID (FOLVITE) 1 MG TABLET    Take 1 mg by mouth daily.   HYDROCODONE-ACETAMINOPHEN (NORCO/VICODIN) 5-325 MG PER TABLET    Take 1 tablet by mouth daily as needed.   IBUPROFEN (ADVIL,MOTRIN) 200 MG TABLET  Take 800 mg by mouth every 6 (six) hours as needed for moderate pain.   JANUVIA 100 MG TABLET    Take 100 mg by mouth daily.    METOPROLOL TARTRATE (LOPRESSOR) 25 MG TABLET    Take 0.5 tablets (12.5 mg total) by mouth 2 (two) times daily.   OMEPRAZOLE (PRILOSEC) 20 MG CAPSULE    Take 20 mg by mouth daily.   OXYCODONE (OXY-IR) 5 MG CAPSULE    Take 1 capsule (5 mg total) by mouth every 4 (four) hours as needed.   POTASSIUM CHLORIDE SA (K-DUR,KLOR-CON) 20 MEQ TABLET    Take 1 tablet (20 mEq total) by mouth daily.  Modified Medications   No medications on file  Discontinued Medications   No medications on file     SIGNIFICANT DIAGNOSTIC EXAMS   12-10-14: ct of chest: 1. Overall stable CT appearance of these large complex right upper lobe lung mass, likely a combination of tumor and radiation change. 2. Stable mediastinal lymph nodes. 3. No CT findings for metastatic pulmonary disease. 4. Stable soft tissue thickening involving the anterior chest wall at the  same level of the tumor and an abnormal anterior rib. This all likely radiation change. 5. Stable adrenal gland adenomas.    LABS REVIEWED:   02-06-15: wbc 10.4; hgb 11.1; hct 34.3; mcv 105.2; plt 203; glucose 97; bun 14.1; creat 1.0; k+ 3.8; na++145; alk phos 160; albumin 2.3 02-12-15: wbc 4.8; hgb 5.0; hct 16.0; mcv 106; plt 234; glucose 93; bun 25;creat 1.08; k+4.0; na++140 02-13-15: wbc 1.3 hgb 10.2; hct 32.2; mcv 101.3; plt 139; glucose 85; bun 19; creat 0.84; k+3.6; na++140; ast 42 alt 26; alk phos 115; albumin 2.2 02-14-15: wbc 1.3; hgb 9.9; hct 30.7; mcv 100.7; plt 124; glucose 92; bun 15; creat 0.92; k+3.3; na++139     Review of Systems Constitutional: Negative for appetite change and fatigue.  HENT: Negative for congestion.   Respiratory: Negative for cough, chest tightness and shortness of breath.   Cardiovascular: Negative for chest pain, palpitations and leg swelling.  Gastrointestinal: Negative for nausea, abdominal pain, diarrhea and constipation.  Musculoskeletal:       Bilateral leg pain   Skin: Negative for pallor.       Bilateral lower extremity blisters   Neurological: Negative for dizziness.  Psychiatric/Behavioral: The patient is not nervous/anxious.       Physical Exam Constitutional: No distress.  Obese   Eyes: Conjunctivae are normal.  Neck: Neck supple. No JVD present. No thyromegaly present.  Cardiovascular: Normal rate and regular rhythm.   Pedal pulses not palpable bilaterally   Respiratory: Effort normal and breath sounds normal. No respiratory distress. She has no wheezes.  GI: Soft. Bowel sounds are normal. She exhibits no distension. There is no tenderness.  Musculoskeletal: She exhibits no edema.  Able to move all extremities   Lymphadenopathy:    She has no cervical adenopathy.  Neurological: She is alert.  Skin: Skin is warm and dry. She is not diaphoretic.  Lower extremities discolored Has bilateral lower extremity blisters present.     Psychiatric: She has a normal mood and affect.     ASSESSMENT/ PLAN:  Will discharge to home with home health for pt/ot/rn/st to evaluate and treat as indicated for bed mobility; strength; adl training; pain and medication management; and swallowing. She will not need dme. Her prescriptions have been written for a 30 day supply of her medications with #120 motrin 800 mg tabs and #60 oxycodone 5 mg  tabs. She has a follow up with DR. Arbuere on 03-21-15 at 10:45 AM    Time spent with patient  45  minutes >50% time spent counseling; reviewing medical record; tests; labs; and developing future plan of care   Ok Edwards NP [facility]  Contact [phone] Monday through Friday 8am- 5pm  After hours call [phone]

## 2015-05-10 ENCOUNTER — Telehealth: Payer: Self-pay | Admitting: *Deleted

## 2015-05-10 NOTE — Telephone Encounter (Signed)
Beverly Lewis called requesting a follow up appt with Dr. Julien Nordmann.  Per [name], pt has been coughing up dark blood ( about teaspoon ) since early in the week.  Message sent to Dr. Julien Nordmann, and desk nurse today. [name]'s  Phone    [phone].

## 2015-05-10 NOTE — Telephone Encounter (Signed)
Jenny Reichmann, NP symptom management notified of grandson's call.  Attempted to call [name] back  X  3  Unsuccessfully, and no voice mail available.  Called pt on cell phone and spoke with pt.  Per pt, she did not cough up much blood - not teaspoon - just very small amount but bright red blood.  Denied shortness of breath, can perform limited ADLs without difficulty.   Stated she did not come for follow up appt on 03/13/15 due to pt was sick.  Pt stated she did not reschedule appt.   Pt would like an appt with Dr. Julien Nordmann. Informed pt that message will be relayed to Dr. Julien Nordmann on Monday 11/21.   Pt will be contacted with md's instructions.  Pt understood that between now and until pt hears back from office, if pt coughs up more blood and or getting shortness of breath, pt needs to go to ER for further evaluation.    Jenny Reichmann, NP notified.

## 2015-05-11 ENCOUNTER — Other Ambulatory Visit: Payer: Self-pay | Admitting: Internal Medicine

## 2015-05-11 ENCOUNTER — Other Ambulatory Visit: Payer: Self-pay | Admitting: Adult Health

## 2015-05-13 ENCOUNTER — Telehealth: Payer: Self-pay | Admitting: Internal Medicine

## 2015-05-13 ENCOUNTER — Other Ambulatory Visit: Payer: Self-pay | Admitting: Adult Health

## 2015-05-13 NOTE — Telephone Encounter (Signed)
s.w. pt and advised on NOV appt....pt ok and aware °

## 2015-05-20 ENCOUNTER — Other Ambulatory Visit: Payer: Self-pay | Admitting: Oncology

## 2015-05-20 ENCOUNTER — Other Ambulatory Visit: Payer: Medicare Other

## 2015-05-20 ENCOUNTER — Ambulatory Visit: Payer: Medicare Other | Admitting: Oncology

## 2015-05-20 NOTE — Progress Notes (Signed)
Pt no showed for app this am. Patient was contacted and indicated that her grandson "stood her up" for a ride this am because it was too cold. Patient would like to reschedule. Has asked that we call her instead of her grandson with date/time. Cell is [phone]. POF sent to scheduling.

## 2015-05-22 ENCOUNTER — Telehealth: Payer: Self-pay | Admitting: Internal Medicine

## 2015-05-22 NOTE — Telephone Encounter (Signed)
lvm fo rpt regarding to DEC appt.Marland KitchenMarland Kitchen

## 2015-05-27 ENCOUNTER — Encounter: Payer: Self-pay | Admitting: Oncology

## 2015-05-27 ENCOUNTER — Other Ambulatory Visit: Payer: Self-pay | Admitting: Medical Oncology

## 2015-05-27 ENCOUNTER — Telehealth: Payer: Self-pay | Admitting: Internal Medicine

## 2015-05-27 ENCOUNTER — Ambulatory Visit (HOSPITAL_BASED_OUTPATIENT_CLINIC_OR_DEPARTMENT_OTHER): Payer: Medicare Other | Admitting: Oncology

## 2015-05-27 ENCOUNTER — Other Ambulatory Visit (HOSPITAL_BASED_OUTPATIENT_CLINIC_OR_DEPARTMENT_OTHER): Payer: Medicare Other

## 2015-05-27 VITALS — BP 135/91 | HR 77 | Temp 97.7°F | Resp 18 | Ht 63.0 in

## 2015-05-27 DIAGNOSIS — C3491 Malignant neoplasm of unspecified part of right bronchus or lung: Secondary | ICD-10-CM | POA: Diagnosis not present

## 2015-05-27 DIAGNOSIS — E876 Hypokalemia: Secondary | ICD-10-CM

## 2015-05-27 DIAGNOSIS — R042 Hemoptysis: Secondary | ICD-10-CM | POA: Diagnosis not present

## 2015-05-27 DIAGNOSIS — C3490 Malignant neoplasm of unspecified part of unspecified bronchus or lung: Secondary | ICD-10-CM

## 2015-05-27 LAB — COMPREHENSIVE METABOLIC PANEL
AST: 14 U/L (ref 5–34)
Albumin: 2.7 g/dL — ABNORMAL LOW (ref 3.5–5.0)
Alkaline Phosphatase: 143 U/L (ref 40–150)
Anion Gap: 10 mEq/L (ref 3–11)
BUN: 22.4 mg/dL (ref 7.0–26.0)
CALCIUM: 10.1 mg/dL (ref 8.4–10.4)
CHLORIDE: 111 meq/L — AB (ref 98–109)
CO2: 25 meq/L (ref 22–29)
CREATININE: 1.2 mg/dL — AB (ref 0.6–1.1)
EGFR: 50 mL/min/{1.73_m2} — ABNORMAL LOW (ref >90)
Glucose: 90 mg/dl (ref 70–140)
Potassium: 2.7 mEq/L — CL (ref 3.5–5.1)
Sodium: 146 mEq/L — ABNORMAL HIGH (ref 136–145)
Total Bilirubin: 0.3 mg/dL (ref 0.20–1.20)
Total Protein: 7.4 g/dL (ref 6.4–8.3)

## 2015-05-27 LAB — CBC WITH DIFFERENTIAL/PLATELET
BASO%: 1.2 % (ref 0.0–2.0)
Basophils Absolute: 0.1 10*3/uL (ref 0.0–0.1)
EOS%: 1.1 % (ref 0.0–7.0)
Eosinophils Absolute: 0.1 10*3/uL (ref 0.0–0.5)
HEMATOCRIT: 38.3 % (ref 34.8–46.6)
HGB: 12.5 g/dL (ref 11.6–15.9)
LYMPH%: 31.2 % (ref 14.0–49.7)
MCH: 33.1 pg (ref 25.1–34.0)
MCHC: 32.5 g/dL (ref 31.5–36.0)
MCV: 101.7 fL — ABNORMAL HIGH (ref 79.5–101.0)
MONO#: 0.4 10*3/uL (ref 0.1–0.9)
MONO%: 9.1 % (ref 0.0–14.0)
NEUT#: 2.7 10*3/uL (ref 1.5–6.5)
NEUT%: 57.4 % (ref 38.4–76.8)
Platelets: 232 10*3/uL (ref 145–400)
RBC: 3.77 10*6/uL (ref 3.70–5.45)
RDW: 15.7 % — ABNORMAL HIGH (ref 11.2–14.5)
WBC: 4.7 10*3/uL (ref 3.9–10.3)
lymph#: 1.5 10*3/uL (ref 0.9–3.3)

## 2015-05-27 MED ORDER — POTASSIUM CHLORIDE CRYS ER 20 MEQ PO TBCR: 20.0000 meq | EXTENDED_RELEASE_TABLET | Freq: Two times a day (BID) | ORAL | Status: DC

## 2015-05-27 NOTE — Progress Notes (Signed)
rx sent to pharmacy for kdur and I instructed pt to start rx.

## 2015-05-27 NOTE — Progress Notes (Signed)
[place] [phone]   Fax:(336) [phone]  OFFICE PROGRESS NOTE  [name], MD [place] [address]  DIAGNOSIS: Lung cancer  Primary site: Lung (Right)  Staging method: AJCC 7th Edition  Clinical: Stage IIIA (T3, N2, M0) signed by Curt Bears, MD on 01/01/2014 6:57 PM  Summary: Stage IIIA (T3, N2, M0)   PRIOR THERAPY: Systemic chemotherapy with carboplatin for an AUC of 4 and Alimta 400 mg/m2 given in 3 weeks. Status post 6 cycles, last dose was given 05/07/2014 with partial response.   CURRENT THERAPY: Systemic chemotherapy with single agent Alimta 400 MG/M2 every 3 weeks. First dose given 09/17/2014. Status post 6 cycles. Last cycle was given on 02/12/2015. The patient was subsequently hospitalized for diabetic ulcers and then in a rehabilitation facility. This is her first visit back with [place] since August 2016.  DISEASE STAGE:  Lung cancer  Primary site: Lung (Right)  Staging method: AJCC 7th Edition  Clinical: Stage IIIA (T3, N2, M0) signed by Curt Bears, MD on 01/01/2014 6:57 PM  Summary: Stage IIIA (T3, N2, M0)  CHEMOTHERAPY INTENT: palliative  CURRENT # OF CHEMOTHERAPY CYCLES: 6 CURRENT ANTIEMETICS: compazine  CURRENT SMOKING STATUS: current smoker  ORAL CHEMOTHERAPY AND CONSENT: n/a  CURRENT BISPHOSPHONATES USE: none  PAIN MANAGEMENT: hydrocodone  NARCOTICS INDUCED CONSTIPATION: none  LIVING WILL AND CODE STATUS:   INTERVAL HISTORY: Beverly Lewis 81 y.o. female returns to the clinic today for follow-up visit accompanied by her grandson. The patient has 7 seen in our office since August 2016. She was hospitalized for diabetic ulcers and then placed in a rehabilitation facility for short period of time. She is now back home. The patient is feeling fine today except for the swelling of the lower extremity and mild fatigue. She also has pain in her legs and back. She is currently on  oxycodone which is being prescribed by another provider. She denied having any significant chest pain but has shortness breath with exertion. Reports an intermittent cough with blood-tinged sputum. She denied having any significant weight loss or night sweats. No fever or chills. She has no bleeding issues.   MEDICAL HISTORY: Past Medical History  Diagnosis Date  . Hypertension   . Hyperlipidemia   . Cerebrovascular accident [facility]) history of mild cerebrovascular accident which caused some visual disturbance.   . Diabetes mellitus   . Morbid obesity ([place])   . Lung mass     Right upper Lobe  . Shortness of breath   . Pneumonia     hx  . Peripheral vascular disease (HCC)     hx blood clot 30 yrs ago leg  . GERD (gastroesophageal reflux disease)   . Arthritis   . Pneumothorax, iatrogenic 8/2/113-discharge note    Post Biospy  . Cough with hemoptysis 01/22/12    Prior to discharge  . Tachycardia 01/22/12    Prior to discharge  . History of radiation therapy 02/18/12,02/23/12,02/25/12., 03/01/12,&03/03/12    RULlung 50Gy/5/fx  . Lung cancer ([place]) 01/21/12  . AAA (abdominal aortic aneurysm) (HCC)     ALLERGIES:  is allergic to asa buff (mag.  MEDICATIONS:  Current Outpatient Prescriptions  Medication Sig Dispense Refill  . acetaminophen (TYLENOL) 500 MG tablet Take 500 mg by mouth every 6 (six) hours as needed for moderate pain (inflammation).    Marland Kitchen allopurinol (ZYLOPRIM) 100 MG tablet Take 100 mg by mouth daily.     Marland Kitchen aspirin EC 81 MG tablet  Take 81 mg by mouth daily.    Marland Kitchen atorvastatin (LIPITOR) 20 MG tablet Take 20 mg by mouth daily.      . cholecalciferol (VITAMIN D) 1000 UNITS tablet Take 1,000 Units by mouth daily.    Marland Kitchen COLCRYS 0.6 MG tablet Take 0.6 mg by mouth 2 (two) times daily as needed (gout). For gout    . dexamethasone (DECADRON) 4 MG tablet Take 1 tablet by mouth the day before, the day of and the day after chemotherapy 30 tablet 0  . eprosartan (TEVETEN) 600 MG tablet As directed   5  . folic acid (FOLVITE) 1 MG tablet Take 1 mg by mouth daily.    Marland Kitchen gabapentin (NEURONTIN) 300 MG capsule As directed  5  . hydrochlorothiazide (HYDRODIURIL) 25 MG tablet As directed  5  . HYDROcodone-acetaminophen (NORCO/VICODIN) 5-325 MG per tablet Take 1 tablet by mouth daily as needed.  0  . ibuprofen (ADVIL,MOTRIN) 200 MG tablet Take 800 mg by mouth every 6 (six) hours as needed for moderate pain.    Marland Kitchen JANUVIA 100 MG tablet Take 100 mg by mouth daily.     . metoprolol tartrate (LOPRESSOR) 25 MG tablet Take 0.5 tablets (12.5 mg total) by mouth 2 (two) times daily. 30 tablet 1  . mirtazapine (REMERON) 15 MG tablet As directed  0  . omeprazole (PRILOSEC) 20 MG capsule Take 20 mg by mouth daily.    Marland Kitchen oxycodone (OXY-IR) 5 MG capsule Take 1 capsule (5 mg total) by mouth every 4 (four) hours as needed. 30 capsule 0  . potassium chloride SA (K-DUR,KLOR-CON) 20 MEQ tablet Take 1 tablet (20 mEq total) by mouth daily. 10 tablet 0   No current facility-administered medications for this visit.    SURGICAL HISTORY:  Past Surgical History  Procedure Laterality Date  . Abdominal aortic aneurysm repair  06/28/10    Stent Graft repair    . Exploratory laparotomy      For ectopic pregnancy  . Lumbar disc surgery    . Aortogram   03/03/11    with stenting , left external iliac artery  . Appendectomy    . Navigational bronchoscopy      Right Upper Lobe Mass with Biopsies, Brushings, Washings, and bronchoalveolar Lavage:  . Paratracheal adenopathy and bilateral adrenal nodules      REVIEW OF SYSTEMS:  Constitutional: positive for fatigue Eyes: negative Ears, nose, mouth, throat, and face: negative Respiratory: positive for dyspnea on exertion and Blood-tinged sputum Cardiovascular: negative Gastrointestinal: negative Genitourinary:negative Integument/breast: negative Hematologic/lymphatic: negative Musculoskeletal:negative Neurological: negative Behavioral/Psych: negative Endocrine:  negative Allergic/Immunologic: negative   PHYSICAL EXAMINATION: General appearance: alert, cooperative, fatigued and no distress Head: Normocephalic, without obvious abnormality, atraumatic Neck: no adenopathy, no JVD, supple, symmetrical, trachea midline and thyroid not enlarged, symmetric, no tenderness/mass/nodules Lymph nodes: Cervical, supraclavicular, and axillary nodes normal. Resp: clear to auscultation bilaterally Back: symmetric, no curvature. ROM normal. No CVA tenderness. Cardio: regular rate and rhythm, S1, S2 normal, no murmur, click, rub or gallop GI: soft, non-tender; bowel sounds normal; no masses,  no organomegaly Extremities: extremities normal, atraumatic, no cyanosis or edema Neurologic: Alert and oriented X 3, normal strength and tone. Normal symmetric reflexes. Normal coordination and gait  ECOG PERFORMANCE STATUS: 2 - Symptomatic, <50% confined to bed  Blood pressure 135/91, pulse 77, temperature 97.7 F (36.5 C), temperature source Oral, resp. rate 18, height '5\' 3"'$  (1.6 m), SpO2 100 %.  LABORATORY DATA: Lab Results  Component Value Date   WBC 4.7  05/27/2015   HGB 12.5 05/27/2015   HCT 38.3 05/27/2015   MCV 101.7* 05/27/2015   PLT 232 05/27/2015      Chemistry      Component Value Date/Time   NA 146* 05/27/2015 1108   NA 139 02/14/2015 0433   K 2.7 Repeated and Verified* 05/27/2015 1108   K 3.3* 02/14/2015 0433   CL 106 02/14/2015 0433   CO2 25 05/27/2015 1108   CO2 25 02/14/2015 0433   BUN 22.4 05/27/2015 1108   BUN 15 02/14/2015 0433   CREATININE 1.2* 05/27/2015 1108   CREATININE 0.92 02/14/2015 0433   CREATININE 1.10 10/23/2013 1118      Component Value Date/Time   CALCIUM 10.1 05/27/2015 1108   CALCIUM 8.2* 02/14/2015 0433   ALKPHOS 143 05/27/2015 1108   ALKPHOS 115 02/13/2015 0955   AST 14 05/27/2015 1108   AST 42* 02/13/2015 0955   ALT <9 Repeated and Verified 05/27/2015 1108   ALT 26 02/13/2015 0955   BILITOT 0.30 05/27/2015 1108    BILITOT 1.1 02/13/2015 0955       RADIOGRAPHIC STUDIES: No results found.  ASSESSMENT AND PLAN: This is a very pleasant 81 years old African-American female with recurrent non-small cell lung cancer, adenocarcinoma. She most recently received systemic chemotherapy with single agent Alimta status post 6 cycles and tolerated her treatment well.  She has not had chemotherapy since August 2016 due to hospitalization and stay at a rehabilitation facility. She is now back home. Discussed with Dr. Julien Nordmann. We will proceed with restaging CT scans for evaluation of her disease. She will return to see Dr. Julien Nordmann to discuss the results.   The patient's hemoglobin has improved 12.5. She has a very small amount of blood-tinged sputum. Advised the patient to report to emergency room if he begins to cough up blood.  Return visit in 2-3 weeks following the CT scans to review the results. She was advised to call immediately if she has any concerning symptoms in the interval.  The patient voices understanding of current disease status and treatment options and is in agreement with the current care plan.  All questions were answered. The patient knows to call the clinic with any problems, questions or concerns. We can certainly see the patient much sooner if necessary.  Mikey Bussing, DNP, AGPCNP-BC, AOCNP

## 2015-05-27 NOTE — Telephone Encounter (Signed)
Gave and printed appt sched and avs for pt for DEC...gv barium

## 2015-06-03 ENCOUNTER — Ambulatory Visit (HOSPITAL_COMMUNITY)
Admission: RE | Admit: 2015-06-03 | Discharge: 2015-06-03 | Disposition: A | Discharge status: Home / Self Care | Payer: Medicare Other | Source: Ambulatory Visit | Attending: Oncology | Admitting: Oncology

## 2015-06-03 ENCOUNTER — Encounter (HOSPITAL_COMMUNITY): Payer: Self-pay

## 2015-06-03 DIAGNOSIS — R590 Localized enlarged lymph nodes: Secondary | ICD-10-CM | POA: Insufficient documentation

## 2015-06-03 DIAGNOSIS — I2510 Atherosclerotic heart disease of native coronary artery without angina pectoris: Secondary | ICD-10-CM | POA: Insufficient documentation

## 2015-06-03 DIAGNOSIS — D3501 Benign neoplasm of right adrenal gland: Secondary | ICD-10-CM | POA: Diagnosis not present

## 2015-06-03 DIAGNOSIS — K769 Liver disease, unspecified: Secondary | ICD-10-CM | POA: Insufficient documentation

## 2015-06-03 DIAGNOSIS — R911 Solitary pulmonary nodule: Secondary | ICD-10-CM | POA: Diagnosis not present

## 2015-06-03 DIAGNOSIS — D3502 Benign neoplasm of left adrenal gland: Secondary | ICD-10-CM | POA: Diagnosis not present

## 2015-06-03 DIAGNOSIS — I700 Atherosclerosis of aorta: Secondary | ICD-10-CM | POA: Insufficient documentation

## 2015-06-03 DIAGNOSIS — C3490 Malignant neoplasm of unspecified part of unspecified bronchus or lung: Secondary | ICD-10-CM | POA: Diagnosis not present

## 2015-06-03 MED ORDER — IOHEXOL 300 MG/ML  SOLN
100.0000 mL | Freq: Once | INTRAMUSCULAR | Status: AC | PRN
  Administered 2015-06-03: 80 mL via INTRAVENOUS

## 2015-06-10 ENCOUNTER — Other Ambulatory Visit: Payer: Medicare Other

## 2015-06-10 ENCOUNTER — Ambulatory Visit: Payer: Medicare Other | Admitting: Internal Medicine

## 2015-06-10 ENCOUNTER — Telehealth: Payer: Self-pay | Admitting: Internal Medicine

## 2015-06-10 ENCOUNTER — Telehealth: Payer: Self-pay

## 2015-06-10 NOTE — Telephone Encounter (Signed)
Patients grandson called in to reschedule her appointment as she was not feeling well

## 2015-06-19 ENCOUNTER — Encounter: Payer: Self-pay | Admitting: Internal Medicine

## 2015-06-19 ENCOUNTER — Ambulatory Visit (HOSPITAL_BASED_OUTPATIENT_CLINIC_OR_DEPARTMENT_OTHER): Payer: Medicare Other | Admitting: Internal Medicine

## 2015-06-19 ENCOUNTER — Other Ambulatory Visit (HOSPITAL_BASED_OUTPATIENT_CLINIC_OR_DEPARTMENT_OTHER): Payer: Medicare Other

## 2015-06-19 VITALS — BP 136/55 | HR 84 | Temp 97.8°F | Resp 18 | Ht 63.0 in | Wt 196.3 lb

## 2015-06-19 DIAGNOSIS — R600 Localized edema: Secondary | ICD-10-CM

## 2015-06-19 DIAGNOSIS — Z720 Tobacco use: Secondary | ICD-10-CM

## 2015-06-19 DIAGNOSIS — M7989 Other specified soft tissue disorders: Secondary | ICD-10-CM | POA: Diagnosis not present

## 2015-06-19 DIAGNOSIS — C3490 Malignant neoplasm of unspecified part of unspecified bronchus or lung: Secondary | ICD-10-CM

## 2015-06-19 DIAGNOSIS — C3491 Malignant neoplasm of unspecified part of right bronchus or lung: Secondary | ICD-10-CM

## 2015-06-19 DIAGNOSIS — C3411 Malignant neoplasm of upper lobe, right bronchus or lung: Secondary | ICD-10-CM | POA: Diagnosis not present

## 2015-06-19 LAB — CBC WITH DIFFERENTIAL/PLATELET
BASO%: 1.2 % (ref 0.0–2.0)
BASOS ABS: 0.1 10*3/uL (ref 0.0–0.1)
EOS ABS: 0.2 10*3/uL (ref 0.0–0.5)
EOS%: 4 % (ref 0.0–7.0)
HCT: 36 % (ref 34.8–46.6)
HEMOGLOBIN: 11.5 g/dL — AB (ref 11.6–15.9)
LYMPH%: 25 % (ref 14.0–49.7)
MCH: 32.3 pg (ref 25.1–34.0)
MCHC: 31.8 g/dL (ref 31.5–36.0)
MCV: 101.5 fL — AB (ref 79.5–101.0)
MONO#: 0.7 10*3/uL (ref 0.1–0.9)
MONO%: 12.9 % (ref 0.0–14.0)
NEUT%: 56.9 % (ref 38.4–76.8)
NEUTROS ABS: 3.1 10*3/uL (ref 1.5–6.5)
PLATELETS: 161 10*3/uL (ref 145–400)
RBC: 3.55 10*6/uL — AB (ref 3.70–5.45)
RDW: 16 % — ABNORMAL HIGH (ref 11.2–14.5)
WBC: 5.5 10*3/uL (ref 3.9–10.3)
lymph#: 1.4 10*3/uL (ref 0.9–3.3)

## 2015-06-19 LAB — COMPREHENSIVE METABOLIC PANEL
ALBUMIN: 2.7 g/dL — AB (ref 3.5–5.0)
ALK PHOS: 167 U/L — AB (ref 40–150)
ALT: <9 U/L (ref 0–55)
ANION GAP: 8 meq/L (ref 3–11)
AST: 14 U/L (ref 5–34)
BUN: 18.7 mg/dL (ref 7.0–26.0)
CO2: 23 mEq/L (ref 22–29)
Calcium: 9.3 mg/dL (ref 8.4–10.4)
Chloride: 113 mEq/L — ABNORMAL HIGH (ref 98–109)
Creatinine: 1.2 mg/dL — ABNORMAL HIGH (ref 0.6–1.1)
EGFR: 50 mL/min/{1.73_m2} — ABNORMAL LOW (ref >90)
Glucose: 85 mg/dl (ref 70–140)
Potassium: 4.4 mEq/L (ref 3.5–5.1)
Sodium: 143 mEq/L (ref 136–145)
TOTAL PROTEIN: 7.2 g/dL (ref 6.4–8.3)

## 2015-06-19 MED ORDER — HYDROCODONE-ACETAMINOPHEN 5-325 MG PO TABS: 1.0000 | ORAL_TABLET | Freq: Every day | ORAL | Status: DC | PRN

## 2015-06-19 NOTE — Progress Notes (Signed)
[place] [facility] [phone]   [phone]  OFFICE PROGRESS NOTE  [name], MD [place] [address]  DIAGNOSIS: Lung cancer  Primary site: Lung (Right)  Staging method: AJCC 7th Edition  Clinical: Stage IIIA (T3, N2, M0) signed by Curt Bears, MD on 01/01/2014 6:57 PM  Summary: Stage IIIA (T3, N2, M0)   PRIOR THERAPY:  1) palliative radiotherapy to the right upper lobe lung mass under the care of Dr. Pablo Ledger completed September 2013. 2) Systemic chemotherapy with carboplatin for an AUC of 4 and Alimta 400 mg/m2 given in 3 weeks. Status post 6 cycles, last dose was given 05/07/2014 with partial response.  3) Systemic chemotherapy with single agent Alimta 400 MG/M2 every 3 weeks. First dose given 09/17/2014. Status post 6 cycles, last dose was given on 02/06/2015 discontinued secondary to intolerance.   CURRENT THERAPY: Observation.  DISEASE STAGE:  Lung cancer  Primary site: Lung (Right)  Staging method: AJCC 7th Edition  Clinical: Stage IIIA (T3, N2, M0) signed by Curt Bears, MD on 01/01/2014 6:57 PM  Summary: Stage IIIA (T3, N2, M0)  CHEMOTHERAPY INTENT: palliative  CURRENT # OF CHEMOTHERAPY CYCLES: 6 CURRENT ANTIEMETICS: compazine  CURRENT SMOKING STATUS: current smoker  ORAL CHEMOTHERAPY AND CONSENT: n/a  CURRENT BISPHOSPHONATES USE: none  PAIN MANAGEMENT: hydrocodone  NARCOTICS INDUCED CONSTIPATION: none  LIVING WILL AND CODE STATUS:   INTERVAL HISTORY: Beverly Lewis 81 y.o. female returns to the clinic today for follow-up visit accompanied by her grandson. The patient has been observation since August 2016. She is feeling fine with no specific complaints today except for occasional cough with blood-tinged sputum. She also has shortness breath with exertion. She denied having any significant fever or chills. She has no nausea or vomiting. She has swelling of the lower extremities  and currently on Lasix by her primary care physician. She had repeat CT scan of the chest, abdomen and pelvis performed recently and she is here for evaluation and discussion of her scan results.  MEDICAL HISTORY: Past Medical History  Diagnosis Date  . Hypertension   . Hyperlipidemia   . Cerebrovascular accident [facility]) history of mild cerebrovascular accident which caused some visual disturbance.   . Morbid obesity ([place])   . Lung mass     Right upper Lobe  . Shortness of breath   . Pneumonia     hx  . Peripheral vascular disease ([facility])     hx blood clot 30 yrs ago leg  . GERD (gastroesophageal reflux disease)   . Arthritis   . Pneumothorax, iatrogenic 8/2/113-discharge note    Post Biospy  . Cough with hemoptysis 01/22/12    Prior to discharge  . Tachycardia 01/22/12    Prior to discharge  . History of radiation therapy 02/18/12,02/23/12,02/25/12., 03/01/12,&03/03/12    RULlung 50Gy/5/fx  . AAA (abdominal aortic aneurysm) ([place])   . Lung cancer ([place]) 01/21/12  . Diabetes mellitus     ALLERGIES:  is allergic to asa buff (mag.  MEDICATIONS:  Current Outpatient Prescriptions  Medication Sig Dispense Refill  . acetaminophen (TYLENOL) 500 MG tablet Take 500 mg by mouth every 6 (six) hours as needed for moderate pain (inflammation).    Marland Kitchen allopurinol (ZYLOPRIM) 100 MG tablet Take 100 mg by mouth daily.     Marland Kitchen aspirin EC 81 MG tablet Take 81 mg by mouth daily.    Marland Kitchen atorvastatin (LIPITOR) 20 MG tablet Take 20 mg by mouth daily.      Marland Kitchen  cholecalciferol (VITAMIN D) 1000 UNITS tablet Take 1,000 Units by mouth daily.    Marland Kitchen COLCRYS 0.6 MG tablet Take 0.6 mg by mouth 2 (two) times daily as needed (gout). For gout    . dexamethasone (DECADRON) 4 MG tablet Take 1 tablet by mouth the day before, the day of and the day after chemotherapy 30 tablet 0  . eprosartan (TEVETEN) 600 MG tablet As directed  5  . folic acid (FOLVITE) 1 MG tablet Take 1 mg by mouth daily.    Marland Kitchen gabapentin (NEURONTIN) 300 MG capsule As  directed  5  . hydrochlorothiazide (HYDRODIURIL) 25 MG tablet As directed  5  . HYDROcodone-acetaminophen (NORCO/VICODIN) 5-325 MG per tablet Take 1 tablet by mouth daily as needed.  0  . ibuprofen (ADVIL,MOTRIN) 200 MG tablet Take 800 mg by mouth every 6 (six) hours as needed for moderate pain.    Marland Kitchen JANUVIA 100 MG tablet Take 100 mg by mouth daily.     . metoprolol tartrate (LOPRESSOR) 25 MG tablet Take 0.5 tablets (12.5 mg total) by mouth 2 (two) times daily. 30 tablet 1  . mirtazapine (REMERON) 15 MG tablet As directed  0  . omeprazole (PRILOSEC) 20 MG capsule Take 20 mg by mouth daily.    Marland Kitchen oxycodone (OXY-IR) 5 MG capsule Take 1 capsule (5 mg total) by mouth every 4 (four) hours as needed. 30 capsule 0  . potassium chloride SA (K-DUR,KLOR-CON) 20 MEQ tablet Take 1 tablet (20 mEq total) by mouth 2 (two) times daily. 20 tablet 0   No current facility-administered medications for this visit.    SURGICAL HISTORY:  Past Surgical History  Procedure Laterality Date  . Abdominal aortic aneurysm repair  06/28/10    Stent Graft repair    . Exploratory laparotomy      For ectopic pregnancy  . Lumbar disc surgery    . Aortogram   03/03/11    with stenting , left external iliac artery  . Appendectomy    . Navigational bronchoscopy      Right Upper Lobe Mass with Biopsies, Brushings, Washings, and bronchoalveolar Lavage:  . Paratracheal adenopathy and bilateral adrenal nodules      REVIEW OF SYSTEMS:  Constitutional: positive for fatigue Eyes: negative Ears, nose, mouth, throat, and face: negative Respiratory: positive for dyspnea on exertion Cardiovascular: negative Gastrointestinal: negative Genitourinary:negative Integument/breast: negative Hematologic/lymphatic: negative Musculoskeletal:negative Neurological: negative Behavioral/Psych: negative Endocrine: negative Allergic/Immunologic: negative   PHYSICAL EXAMINATION: General appearance: alert, cooperative, fatigued and no  distress Head: Normocephalic, without obvious abnormality, atraumatic Neck: no adenopathy, no JVD, supple, symmetrical, trachea midline and thyroid not enlarged, symmetric, no tenderness/mass/nodules Lymph nodes: Cervical, supraclavicular, and axillary nodes normal. Resp: clear to auscultation bilaterally Back: symmetric, no curvature. ROM normal. No CVA tenderness. Cardio: regular rate and rhythm, S1, S2 normal, no murmur, click, rub or gallop GI: soft, non-tender; bowel sounds normal; no masses,  no organomegaly Extremities: edema 1+ Neurologic: Alert and oriented X 3, normal strength and tone. Normal symmetric reflexes. Normal coordination and gait  ECOG PERFORMANCE STATUS: 2 - Symptomatic, <50% confined to bed  Blood pressure 136/55, pulse 84, temperature 97.8 F (36.6 C), temperature source Oral, resp. rate 18, height '5\' 3"'$  (1.6 m), weight 196 lb 4.8 oz (89.041 kg), SpO2 99 %.  LABORATORY DATA: Lab Results  Component Value Date   WBC 5.5 06/19/2015   HGB 11.5* 06/19/2015   HCT 36.0 06/19/2015   MCV 101.5* 06/19/2015   PLT 161 06/19/2015  Chemistry      Component Value Date/Time   NA 146* 05/27/2015 1108   NA 139 02/14/2015 0433   K 2.7 Repeated and Verified* 05/27/2015 1108   K 3.3* 02/14/2015 0433   CL 106 02/14/2015 0433   CO2 25 05/27/2015 1108   CO2 25 02/14/2015 0433   BUN 22.4 05/27/2015 1108   BUN 15 02/14/2015 0433   CREATININE 1.2* 05/27/2015 1108   CREATININE 0.92 02/14/2015 0433   CREATININE 1.10 10/23/2013 1118      Component Value Date/Time   CALCIUM 10.1 05/27/2015 1108   CALCIUM 8.2* 02/14/2015 0433   ALKPHOS 143 05/27/2015 1108   ALKPHOS 115 02/13/2015 0955   AST 14 05/27/2015 1108   AST 42* 02/13/2015 0955   ALT <9 Repeated and Verified 05/27/2015 1108   ALT 26 02/13/2015 0955   BILITOT 0.30 05/27/2015 1108   BILITOT 1.1 02/13/2015 0955       RADIOGRAPHIC STUDIES: Ct Chest W Contrast  06/03/2015  CLINICAL DATA:  Subsequent  encounter for lung cancer.  Restaging. EXAM: CT CHEST, ABDOMEN, AND PELVIS WITH CONTRAST TECHNIQUE: Multidetector CT imaging of the chest, abdomen and pelvis was performed following the standard protocol during bolus administration of intravenous contrast. CONTRAST:  20m OMNIPAQUE IOHEXOL 300 MG/ML  SOLN COMPARISON:  Chest CT 12/10/2014.  Abdomen and pelvis CT 11/20/2014. FINDINGS: CT CHEST FINDINGS Mediastinum/Lymph Nodes: Small axillary lymph nodes are again noted bilaterally but do not meet CT criteria for pathologic enlargement. 10 mm precarinal lymph node measured on the previous study is now 12 mm. No left hilar lymphadenopathy. Abnormal soft tissue attenuation from the right upper lobe process tracks into the upper portion of the right hilum. Heart is borderline enlarged. Coronary artery calcification is noted. No evidence of pericardial effusion. The esophagus has normal imaging features. Advanced atherosclerosis noted in the thoracic aorta. Lungs/Pleura: Complex lesion in the right upper lobe again identified. This has a confluent area containing air bronchograms with areas of interstitial and patchy alveolar opacity along its superior and posterior margin. As mentioned previously, this probably represents a combination of neoplasm and post treatment change. Overall appearance is relatively stable although reproducible comparative measurement is difficult. On today's study, the lesion measures approximately 9.5 x 3.2 cm compared 8.0 x 3.2 cm previously. The slight difference in long axis measurement may be related to slightly different imaging angulation. 7 mm nodule in the posterior left upper lobe (image 21 series 4) is stable in the interval. Musculoskeletal: Bone windows reveal no worrisome lytic or sclerotic osseous lesions. CT ABDOMEN PELVIS FINDINGS Hepatobiliary: 15 mm hypo attenuating lesion in the lateral segment left liver is stable and likely a cyst. Liver otherwise unremarkable. There is no  evidence for gallstones, gallbladder wall thickening, or pericholecystic fluid. No intrahepatic or extrahepatic biliary dilation. Pancreas: No focal mass lesion. No dilatation of the main duct. No intraparenchymal cyst. No peripancreatic edema. Spleen: No splenomegaly. No focal mass lesion. Adrenals/Urinary Tract: Bilateral adrenal nodules are again noted. Left adrenal nodule averages less than 10 Hounsfield units on today's study. Previous study with uninfused imaging demonstrated right adrenal nodule also with average attenuation less than 10 Hounsfield units. 13 mm hypo attenuating lesion in the upper pole right kidney is stable back to 10/24/2013, and compatible with a cyst. No evidence for enhancing lesion in either kidney. No evidence for hydroureter. The urinary bladder appears normal for the degree of distention. Stomach/Bowel: Stomach is distended with food and contrast material. Duodenum is normally positioned as is  the ligament of Treitz. No small bowel wall thickening. No small bowel dilatation. The terminal ileum is normal. The appendix is normal. Diverticular changes are noted in the left colon without evidence of diverticulitis. Vascular/Lymphatic: Patient is status post aortic endograft placement. Iliac limbs appear patent bilaterally. Native aneurysm sac measures approximately 3.4 cm in maximum dimension. There is no gastrohepatic or hepatoduodenal ligament lymphadenopathy. No intraperitoneal or retroperitoneal lymphadenopy. 10 mm short axis left pelvic sidewall lymph node (image 96 series 2) was 8 mm on 10/24/2013. Pelvis was not included 11/20/2014 study. Small lymph nodes in the groin regions are not substantially changed since 2015. Reproductive: The uterus has normal CT imaging appearance. There is no adnexal mass. Other: No intraperitoneal free fluid. Musculoskeletal: Bone windows reveal no worrisome lytic or sclerotic osseous lesions. IMPRESSION: 1. No substantial interval change in the right  upper lobe lung lesion compatible to a combination of neoplasm and post treatment change. Amorphous character of this lesion makes reproducible comparative measurement difficult. 2. No change in 7 mm left lung nodule. 3. Slight increase in short axis of subcarinal lymph node nail borderline enlarged at 12 mm. Continued attention on followup recommended. 4. Slight increase in size of borderline left pelvic sidewall lymph node when comparing back to 10/25/2018 15. Attention on follow-up recommended. 5. Stable bilateral adrenal adenomas. Electronically Signed   By: Misty Stanley M.D.   On: 06/03/2015 08:58   Ct Abdomen Pelvis W Contrast  06/03/2015  CLINICAL DATA:  Subsequent encounter for lung cancer.  Restaging. EXAM: CT CHEST, ABDOMEN, AND PELVIS WITH CONTRAST TECHNIQUE: Multidetector CT imaging of the chest, abdomen and pelvis was performed following the standard protocol during bolus administration of intravenous contrast. CONTRAST:  52m OMNIPAQUE IOHEXOL 300 MG/ML  SOLN COMPARISON:  Chest CT 12/10/2014.  Abdomen and pelvis CT 11/20/2014. FINDINGS: CT CHEST FINDINGS Mediastinum/Lymph Nodes: Small axillary lymph nodes are again noted bilaterally but do not meet CT criteria for pathologic enlargement. 10 mm precarinal lymph node measured on the previous study is now 12 mm. No left hilar lymphadenopathy. Abnormal soft tissue attenuation from the right upper lobe process tracks into the upper portion of the right hilum. Heart is borderline enlarged. Coronary artery calcification is noted. No evidence of pericardial effusion. The esophagus has normal imaging features. Advanced atherosclerosis noted in the thoracic aorta. Lungs/Pleura: Complex lesion in the right upper lobe again identified. This has a confluent area containing air bronchograms with areas of interstitial and patchy alveolar opacity along its superior and posterior margin. As mentioned previously, this probably represents a combination of neoplasm  and post treatment change. Overall appearance is relatively stable although reproducible comparative measurement is difficult. On today's study, the lesion measures approximately 9.5 x 3.2 cm compared 8.0 x 3.2 cm previously. The slight difference in long axis measurement may be related to slightly different imaging angulation. 7 mm nodule in the posterior left upper lobe (image 21 series 4) is stable in the interval. Musculoskeletal: Bone windows reveal no worrisome lytic or sclerotic osseous lesions. CT ABDOMEN PELVIS FINDINGS Hepatobiliary: 15 mm hypo attenuating lesion in the lateral segment left liver is stable and likely a cyst. Liver otherwise unremarkable. There is no evidence for gallstones, gallbladder wall thickening, or pericholecystic fluid. No intrahepatic or extrahepatic biliary dilation. Pancreas: No focal mass lesion. No dilatation of the main duct. No intraparenchymal cyst. No peripancreatic edema. Spleen: No splenomegaly. No focal mass lesion. Adrenals/Urinary Tract: Bilateral adrenal nodules are again noted. Left adrenal nodule averages less than 10 Hounsfield  units on today's study. Previous study with uninfused imaging demonstrated right adrenal nodule also with average attenuation less than 10 Hounsfield units. 13 mm hypo attenuating lesion in the upper pole right kidney is stable back to 10/24/2013, and compatible with a cyst. No evidence for enhancing lesion in either kidney. No evidence for hydroureter. The urinary bladder appears normal for the degree of distention. Stomach/Bowel: Stomach is distended with food and contrast material. Duodenum is normally positioned as is the ligament of Treitz. No small bowel wall thickening. No small bowel dilatation. The terminal ileum is normal. The appendix is normal. Diverticular changes are noted in the left colon without evidence of diverticulitis. Vascular/Lymphatic: Patient is status post aortic endograft placement. Iliac limbs appear patent  bilaterally. Native aneurysm sac measures approximately 3.4 cm in maximum dimension. There is no gastrohepatic or hepatoduodenal ligament lymphadenopathy. No intraperitoneal or retroperitoneal lymphadenopy. 10 mm short axis left pelvic sidewall lymph node (image 96 series 2) was 8 mm on 10/24/2013. Pelvis was not included 11/20/2014 study. Small lymph nodes in the groin regions are not substantially changed since 2015. Reproductive: The uterus has normal CT imaging appearance. There is no adnexal mass. Other: No intraperitoneal free fluid. Musculoskeletal: Bone windows reveal no worrisome lytic or sclerotic osseous lesions. IMPRESSION: 1. No substantial interval change in the right upper lobe lung lesion compatible to a combination of neoplasm and post treatment change. Amorphous character of this lesion makes reproducible comparative measurement difficult. 2. No change in 7 mm left lung nodule. 3. Slight increase in short axis of subcarinal lymph node nail borderline enlarged at 12 mm. Continued attention on followup recommended. 4. Slight increase in size of borderline left pelvic sidewall lymph node when comparing back to 10/25/2018 15. Attention on follow-up recommended. 5. Stable bilateral adrenal adenomas. Electronically Signed   By: Misty Stanley M.D.   On: 06/03/2015 08:58    ASSESSMENT AND PLAN: This is a very pleasant 81 years old African-American female with recurrent non-small cell lung cancer, adenocarcinoma. She is currently undergoing systemic chemotherapy with single agent Alimta status post 5 cycles and tolerating her treatment well. She completed another course of systemic chemotherapy was carboplatin and Alimta for 6 cycles and this was discontinued secondary to intolerance but the patient had no evidence for disease progression after completion of the 6 cycles. She has been observation for the last 4 months and the recent CT scan of the chest, abdomen and pelvis showed no concerning evidence  for disease progression. I discussed the scan results with the patient and her grandson. I recommended for her to continue on observation for now with repeat CT scan of the chest in 3 months. For pain management, the patient was given a refill for Norco 7.5/325 mg by mouth as needed for pain. For the swelling of the lower extremity, the patient was advised to continue treatment with Lasix as prescribed by her primary care physician. She was advised to call immediately if she has any concerning symptoms in the interval.  The patient voices understanding of current disease status and treatment options and is in agreement with the current care plan.  All questions were answered. The patient knows to call the clinic with any problems, questions or concerns. We can certainly see the patient much sooner if necessary.  Disclaimer: This note was dictated with voice recognition software. Similar sounding words can inadvertently be transcribed and may not be corrected upon review.

## 2015-08-15 ENCOUNTER — Encounter (HOSPITAL_BASED_OUTPATIENT_CLINIC_OR_DEPARTMENT_OTHER): Payer: Medicare Other | Attending: Internal Medicine

## 2015-08-15 DIAGNOSIS — Z85118 Personal history of other malignant neoplasm of bronchus and lung: Secondary | ICD-10-CM | POA: Diagnosis not present

## 2015-08-15 DIAGNOSIS — Z923 Personal history of irradiation: Secondary | ICD-10-CM | POA: Insufficient documentation

## 2015-08-15 DIAGNOSIS — E11621 Type 2 diabetes mellitus with foot ulcer: Secondary | ICD-10-CM | POA: Diagnosis not present

## 2015-08-15 DIAGNOSIS — Z86718 Personal history of other venous thrombosis and embolism: Secondary | ICD-10-CM | POA: Insufficient documentation

## 2015-08-15 DIAGNOSIS — L97421 Non-pressure chronic ulcer of left heel and midfoot limited to breakdown of skin: Secondary | ICD-10-CM | POA: Insufficient documentation

## 2015-08-15 DIAGNOSIS — M069 Rheumatoid arthritis, unspecified: Secondary | ICD-10-CM | POA: Diagnosis not present

## 2015-08-15 DIAGNOSIS — M109 Gout, unspecified: Secondary | ICD-10-CM | POA: Insufficient documentation

## 2015-08-15 DIAGNOSIS — E1140 Type 2 diabetes mellitus with diabetic neuropathy, unspecified: Secondary | ICD-10-CM | POA: Diagnosis not present

## 2015-08-15 DIAGNOSIS — E1151 Type 2 diabetes mellitus with diabetic peripheral angiopathy without gangrene: Secondary | ICD-10-CM | POA: Insufficient documentation

## 2015-08-15 DIAGNOSIS — Z9221 Personal history of antineoplastic chemotherapy: Secondary | ICD-10-CM | POA: Insufficient documentation

## 2015-08-15 LAB — GLUCOSE, CAPILLARY: Glucose-Capillary: 73 mg/dL (ref 65–99)

## 2015-08-22 ENCOUNTER — Encounter (HOSPITAL_BASED_OUTPATIENT_CLINIC_OR_DEPARTMENT_OTHER): Payer: Medicare Other | Attending: Internal Medicine

## 2015-08-22 DIAGNOSIS — I10 Essential (primary) hypertension: Secondary | ICD-10-CM | POA: Insufficient documentation

## 2015-08-22 DIAGNOSIS — Z86718 Personal history of other venous thrombosis and embolism: Secondary | ICD-10-CM | POA: Diagnosis not present

## 2015-08-22 DIAGNOSIS — M069 Rheumatoid arthritis, unspecified: Secondary | ICD-10-CM | POA: Insufficient documentation

## 2015-08-22 DIAGNOSIS — E11621 Type 2 diabetes mellitus with foot ulcer: Secondary | ICD-10-CM | POA: Diagnosis present

## 2015-08-22 DIAGNOSIS — L97422 Non-pressure chronic ulcer of left heel and midfoot with fat layer exposed: Secondary | ICD-10-CM | POA: Diagnosis not present

## 2015-08-22 DIAGNOSIS — E1151 Type 2 diabetes mellitus with diabetic peripheral angiopathy without gangrene: Secondary | ICD-10-CM | POA: Insufficient documentation

## 2015-08-22 DIAGNOSIS — M109 Gout, unspecified: Secondary | ICD-10-CM | POA: Insufficient documentation

## 2015-08-27 ENCOUNTER — Other Ambulatory Visit: Payer: Self-pay | Admitting: Internal Medicine

## 2015-08-27 ENCOUNTER — Ambulatory Visit (HOSPITAL_COMMUNITY)
Admission: RE | Admit: 2015-08-27 | Discharge: 2015-08-27 | Disposition: A | Discharge status: Home / Self Care | Payer: Medicare Other | Source: Ambulatory Visit | Attending: Vascular Surgery | Admitting: Vascular Surgery

## 2015-08-27 DIAGNOSIS — I10 Essential (primary) hypertension: Secondary | ICD-10-CM | POA: Diagnosis not present

## 2015-08-27 DIAGNOSIS — E119 Type 2 diabetes mellitus without complications: Secondary | ICD-10-CM | POA: Insufficient documentation

## 2015-08-27 DIAGNOSIS — R938 Abnormal findings on diagnostic imaging of other specified body structures: Secondary | ICD-10-CM | POA: Diagnosis not present

## 2015-08-27 DIAGNOSIS — I739 Peripheral vascular disease, unspecified: Secondary | ICD-10-CM | POA: Diagnosis present

## 2015-08-27 DIAGNOSIS — E785 Hyperlipidemia, unspecified: Secondary | ICD-10-CM | POA: Diagnosis not present

## 2015-08-30 DIAGNOSIS — E11621 Type 2 diabetes mellitus with foot ulcer: Secondary | ICD-10-CM | POA: Diagnosis not present

## 2015-09-06 DIAGNOSIS — E11621 Type 2 diabetes mellitus with foot ulcer: Secondary | ICD-10-CM | POA: Diagnosis not present

## 2015-09-17 ENCOUNTER — Telehealth: Payer: Self-pay | Admitting: *Deleted

## 2015-09-17 NOTE — Telephone Encounter (Signed)
Son of patient called stating that she has been coughing of blood (dark and bright red) mixed with blood clots. RN notified MD Julien Nordmann. MD recommends her going to the ER for evaluation. Informed patient son and he verbalized understanding.

## 2015-09-19 ENCOUNTER — Telehealth: Payer: Self-pay | Admitting: Internal Medicine

## 2015-09-19 ENCOUNTER — Other Ambulatory Visit: Payer: Self-pay | Admitting: Medical Oncology

## 2015-09-19 DIAGNOSIS — E11621 Type 2 diabetes mellitus with foot ulcer: Secondary | ICD-10-CM | POA: Diagnosis not present

## 2015-09-19 NOTE — Telephone Encounter (Signed)
s.w. pt and advised March and April appt....pt ok and aware

## 2015-09-19 NOTE — Telephone Encounter (Signed)
per pof to add lab-cld & left message on grandson voicemail that a lab had been added and return visit gave time & date for 3/30 @ 10:45

## 2015-09-20 ENCOUNTER — Encounter (HOSPITAL_COMMUNITY): Payer: Self-pay

## 2015-09-20 ENCOUNTER — Ambulatory Visit (HOSPITAL_COMMUNITY)
Admission: RE | Admit: 2015-09-20 | Discharge: 2015-09-20 | Disposition: A | Discharge status: Home / Self Care | Payer: Medicare Other | Source: Ambulatory Visit | Attending: Internal Medicine | Admitting: Internal Medicine

## 2015-09-20 ENCOUNTER — Other Ambulatory Visit (HOSPITAL_BASED_OUTPATIENT_CLINIC_OR_DEPARTMENT_OTHER): Payer: Medicare Other

## 2015-09-20 DIAGNOSIS — C3491 Malignant neoplasm of unspecified part of right bronchus or lung: Secondary | ICD-10-CM | POA: Diagnosis not present

## 2015-09-20 DIAGNOSIS — R600 Localized edema: Secondary | ICD-10-CM

## 2015-09-20 DIAGNOSIS — D3500 Benign neoplasm of unspecified adrenal gland: Secondary | ICD-10-CM | POA: Diagnosis not present

## 2015-09-20 DIAGNOSIS — C3411 Malignant neoplasm of upper lobe, right bronchus or lung: Secondary | ICD-10-CM

## 2015-09-20 LAB — COMPREHENSIVE METABOLIC PANEL
ALT: <9 U/L (ref 0–55)
AST: 12 U/L (ref 5–34)
Albumin: 3.1 g/dL — ABNORMAL LOW (ref 3.5–5.0)
Alkaline Phosphatase: 127 U/L (ref 40–150)
Anion Gap: 6 mEq/L (ref 3–11)
BILIRUBIN TOTAL: 0.37 mg/dL (ref 0.20–1.20)
BUN: 18.2 mg/dL (ref 7.0–26.0)
CO2: 25 meq/L (ref 22–29)
Calcium: 9.4 mg/dL (ref 8.4–10.4)
Chloride: 110 mEq/L — ABNORMAL HIGH (ref 98–109)
Creatinine: 0.9 mg/dL (ref 0.6–1.1)
EGFR: 66 mL/min/{1.73_m2} — AB (ref >90)
GLUCOSE: 78 mg/dL (ref 70–140)
POTASSIUM: 3.9 meq/L (ref 3.5–5.1)
SODIUM: 140 meq/L (ref 136–145)
TOTAL PROTEIN: 7.4 g/dL (ref 6.4–8.3)

## 2015-09-20 LAB — CBC WITH DIFFERENTIAL/PLATELET
BASO%: 0.7 % (ref 0.0–2.0)
Basophils Absolute: 0 10*3/uL (ref 0.0–0.1)
EOS ABS: 0.1 10*3/uL (ref 0.0–0.5)
EOS%: 2.2 % (ref 0.0–7.0)
HCT: 36.9 % (ref 34.8–46.6)
HGB: 11.9 g/dL (ref 11.6–15.9)
LYMPH%: 32.8 % (ref 14.0–49.7)
MCH: 31.8 pg (ref 25.1–34.0)
MCHC: 32.2 g/dL (ref 31.5–36.0)
MCV: 98.7 fL (ref 79.5–101.0)
MONO#: 0.4 10*3/uL (ref 0.1–0.9)
MONO%: 10 % (ref 0.0–14.0)
NEUT%: 54.3 % (ref 38.4–76.8)
NEUTROS ABS: 2.2 10*3/uL (ref 1.5–6.5)
Platelets: 187 10*3/uL (ref 145–400)
RBC: 3.74 10*6/uL (ref 3.70–5.45)
RDW: 14.2 % (ref 11.2–14.5)
WBC: 4.1 10*3/uL (ref 3.9–10.3)
lymph#: 1.4 10*3/uL (ref 0.9–3.3)

## 2015-09-20 MED ORDER — IOPAMIDOL (ISOVUE-300) INJECTION 61%
75.0000 mL | Freq: Once | INTRAVENOUS | Status: AC | PRN
  Administered 2015-09-20: 75 mL via INTRAVENOUS

## 2015-09-24 ENCOUNTER — Encounter: Payer: Self-pay | Admitting: Vascular Surgery

## 2015-09-26 ENCOUNTER — Encounter (HOSPITAL_BASED_OUTPATIENT_CLINIC_OR_DEPARTMENT_OTHER): Payer: Medicare Other | Attending: Internal Medicine

## 2015-09-26 DIAGNOSIS — Z86718 Personal history of other venous thrombosis and embolism: Secondary | ICD-10-CM | POA: Diagnosis not present

## 2015-09-26 DIAGNOSIS — E11621 Type 2 diabetes mellitus with foot ulcer: Secondary | ICD-10-CM | POA: Insufficient documentation

## 2015-09-26 DIAGNOSIS — M069 Rheumatoid arthritis, unspecified: Secondary | ICD-10-CM | POA: Insufficient documentation

## 2015-09-26 DIAGNOSIS — E1136 Type 2 diabetes mellitus with diabetic cataract: Secondary | ICD-10-CM | POA: Insufficient documentation

## 2015-09-26 DIAGNOSIS — L97422 Non-pressure chronic ulcer of left heel and midfoot with fat layer exposed: Secondary | ICD-10-CM | POA: Diagnosis not present

## 2015-09-26 DIAGNOSIS — E1151 Type 2 diabetes mellitus with diabetic peripheral angiopathy without gangrene: Secondary | ICD-10-CM | POA: Diagnosis not present

## 2015-09-30 ENCOUNTER — Ambulatory Visit: Payer: Medicare Other | Admitting: Internal Medicine

## 2015-10-01 ENCOUNTER — Ambulatory Visit (INDEPENDENT_AMBULATORY_CARE_PROVIDER_SITE_OTHER): Payer: Medicare Other | Admitting: Vascular Surgery

## 2015-10-01 ENCOUNTER — Encounter: Payer: Self-pay | Admitting: Vascular Surgery

## 2015-10-01 VITALS — BP 81/57 | HR 79 | Temp 97.9°F | Ht 63.0 in | Wt 196.0 lb

## 2015-10-01 DIAGNOSIS — I739 Peripheral vascular disease, unspecified: Secondary | ICD-10-CM

## 2015-10-01 NOTE — Progress Notes (Signed)
Subjective:     Patient ID: Beverly Lewis, female   DOB: 03/19/1929, 82 y.o.   MRN: [id number]  HPI this 82 year old female was referred from the wound center 2 examine a slowly healing ulcer on the left heel. Patient is well known to me having previously had ruptured abdominal aortic aneurysm with emergency stent graft in 2013. 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 Inda Merlin. Patient apparently required nursing home stay about 6 months ago and developed a pressure sore on the left heel which has been slowly improving at the wound center. She does not ambulate significantly. She does have diabetes mellitus. She has no previous history of nonhealing ulcers.  Past Medical History  Diagnosis Date  . Hypertension   . Hyperlipidemia   . Cerebrovascular accident [facility]) history of mild cerebrovascular accident which caused some visual disturbance.   . Morbid obesity ([place])   . Lung mass     Right upper Lobe  . Shortness of breath   . Pneumonia     hx  . Peripheral vascular disease (HCC)     hx blood clot 30 yrs ago leg  . GERD (gastroesophageal reflux disease)   . Arthritis   . Pneumothorax, iatrogenic 8/2/113-discharge note    Post Biospy  . Cough with hemoptysis 01/22/12    Prior to discharge  . Tachycardia 01/22/12    Prior to discharge  . History of radiation therapy 02/18/12,02/23/12,02/25/12., 03/01/12,&03/03/12    RULlung 50Gy/5/fx  . AAA (abdominal aortic aneurysm) ([place])   . Lung cancer ([place]) 01/21/12  . Diabetes mellitus     Social History  Substance Use Topics  . Smoking status: Current Every Day Smoker -- 0.50 packs/day for 65 years    Types: Cigarettes  . Smokeless tobacco: Never Used  . Alcohol Use: No     Comment: She does not consume alcohol on a regular basis.    Family History  Problem Relation Age of Onset  . Heart disease Mother   . Hypertension Mother   . Diabetes Mother   . Hyperlipidemia Mother   . Heart  disease Father   . Hypertension Father   . Diabetes Father   . Hyperlipidemia Father   . Diabetes Daughter     Allergies  Allergen Reactions  . Asa Buff (Mag [Buffered Aspirin]     In tolerance in higher dosages.     Current outpatient prescriptions:  .  acetaminophen (TYLENOL) 500 MG tablet, Take 500 mg by mouth every 6 (six) hours as needed for moderate pain (inflammation)., Disp: , Rfl:  .  allopurinol (ZYLOPRIM) 100 MG tablet, Take 100 mg by mouth daily. , Disp: , Rfl:  .  aspirin EC 81 MG tablet, Take 81 mg by mouth daily., Disp: , Rfl:  .  atorvastatin (LIPITOR) 20 MG tablet, Take 20 mg by mouth daily.  , Disp: , Rfl:  .  cholecalciferol (VITAMIN D) 1000 UNITS tablet, Take 1,000 Units by mouth daily., Disp: , Rfl:  .  COLCRYS 0.6 MG tablet, Take 0.6 mg by mouth 2 (two) times daily as needed (gout). For gout, Disp: , Rfl:  .  dexamethasone (DECADRON) 4 MG tablet, Take 1 tablet by mouth the day before, the day of and the day after chemotherapy, Disp: 30 tablet, Rfl: 0 .  eprosartan (TEVETEN) 600 MG tablet, As directed, Disp: , Rfl: 5 .  folic acid (FOLVITE) 1 MG tablet, Take 1 mg by mouth daily., Disp: ,  Rfl:  .  gabapentin (NEURONTIN) 300 MG capsule, As directed, Disp: , Rfl: 5 .  hydrochlorothiazide (HYDRODIURIL) 25 MG tablet, As directed, Disp: , Rfl: 5 .  HYDROcodone-acetaminophen (NORCO/VICODIN) 5-325 MG tablet, Take 1 tablet by mouth daily as needed., Disp: 30 tablet, Rfl: 0 .  ibuprofen (ADVIL,MOTRIN) 200 MG tablet, Take 800 mg by mouth every 6 (six) hours as needed for moderate pain., Disp: , Rfl:  .  JANUVIA 100 MG tablet, Take 100 mg by mouth daily. , Disp: , Rfl:  .  metoprolol tartrate (LOPRESSOR) 25 MG tablet, Take 0.5 tablets (12.5 mg total) by mouth 2 (two) times daily., Disp: 30 tablet, Rfl: 1 .  mirtazapine (REMERON) 15 MG tablet, As directed, Disp: , Rfl: 0 .  omeprazole (PRILOSEC) 20 MG capsule, Take 20 mg by mouth daily., Disp: , Rfl:  .  oxycodone (OXY-IR) 5  MG capsule, Take 1 capsule (5 mg total) by mouth every 4 (four) hours as needed., Disp: 30 capsule, Rfl: 0 .  potassium chloride SA (K-DUR,KLOR-CON) 20 MEQ tablet, Take 1 tablet (20 mEq total) by mouth 2 (two) times daily., Disp: 20 tablet, Rfl: 0  Filed Vitals:   10/01/15 0952  BP: 81/57  Pulse: 79  Temp: 97.9 F (36.6 C)  TempSrc: Oral  Height: '5\' 3"'$  (1.6 m)  Weight: 196 lb (88.905 kg)  SpO2: 97%    Body mass index is 34.73 kg/(m^2).           Review of Systems Denies active chest pain does have dyspnea on exertion and lower extremity edema. Does have hstory of CVA and diabetes mellitus and productive cough as well as history of present illness     Objective:   Physical Exam BP 81/57 mmHg  Pulse 79  Temp(Src) 97.9 F (36.6 C) (Oral)  Ht '5\' 3"'$  (1.6 m)  Wt 196 lb (88.905 kg)  BMI 34.73 kg/m2  SpO2 97%   Gen. Chronically ill-appearing female no apparent distress alert and oriented 3 in a wheelchair Lungs bilateral rhonchi Abdomen obese  2+ femoral pulses palpable bilaterally. Left foot has very clean granulating ulcer on the medial aspect of heel measuring about 1-1/2 cm in diameter with no purulence erythema or fluctuance  Today I ordered lower extremity ABIs chart reviewed and interpreted. ABI on the left foot is 0.57 in the posterior tibial with monophasic flow and on the right side is 0.43 with monophasic flow     Assessment:      slowly healing pressure sore left medial heel which developed during nursing home stay  This is improving and healing slowly with visits to the wound center Diffuse superficial femoral and tibial occlusive disease bilaterally     Plan:      would not recommend any further intervention or diagnostic tests regarding circulation Continue wound care as you are doing and the wound center as this ulcer is healing Patient is not a candidate for any type of vascular surgical repair with her ultiple comorbidities I will be seeing her in  a few months for continued follow-up egarding her aortic stent graft for abdominal aortic aneurysm

## 2015-10-10 DIAGNOSIS — E11621 Type 2 diabetes mellitus with foot ulcer: Secondary | ICD-10-CM | POA: Diagnosis not present

## 2015-10-17 DIAGNOSIS — E11621 Type 2 diabetes mellitus with foot ulcer: Secondary | ICD-10-CM | POA: Diagnosis not present

## 2015-10-24 ENCOUNTER — Encounter (HOSPITAL_BASED_OUTPATIENT_CLINIC_OR_DEPARTMENT_OTHER): Payer: Medicare Other | Attending: Internal Medicine

## 2015-10-24 DIAGNOSIS — E1151 Type 2 diabetes mellitus with diabetic peripheral angiopathy without gangrene: Secondary | ICD-10-CM | POA: Insufficient documentation

## 2015-10-24 DIAGNOSIS — E11621 Type 2 diabetes mellitus with foot ulcer: Secondary | ICD-10-CM | POA: Insufficient documentation

## 2015-10-24 DIAGNOSIS — M109 Gout, unspecified: Secondary | ICD-10-CM | POA: Insufficient documentation

## 2015-10-24 DIAGNOSIS — Z86718 Personal history of other venous thrombosis and embolism: Secondary | ICD-10-CM | POA: Diagnosis not present

## 2015-10-24 DIAGNOSIS — Z79899 Other long term (current) drug therapy: Secondary | ICD-10-CM | POA: Insufficient documentation

## 2015-10-24 DIAGNOSIS — Z7982 Long term (current) use of aspirin: Secondary | ICD-10-CM | POA: Insufficient documentation

## 2015-10-24 DIAGNOSIS — I10 Essential (primary) hypertension: Secondary | ICD-10-CM | POA: Insufficient documentation

## 2015-10-24 DIAGNOSIS — L97421 Non-pressure chronic ulcer of left heel and midfoot limited to breakdown of skin: Secondary | ICD-10-CM | POA: Insufficient documentation

## 2015-10-24 DIAGNOSIS — M069 Rheumatoid arthritis, unspecified: Secondary | ICD-10-CM | POA: Insufficient documentation

## 2015-10-31 DIAGNOSIS — E11621 Type 2 diabetes mellitus with foot ulcer: Secondary | ICD-10-CM | POA: Diagnosis not present

## 2015-12-10 ENCOUNTER — Other Ambulatory Visit: Payer: Medicare Other

## 2015-12-11 ENCOUNTER — Encounter: Payer: Self-pay | Admitting: Vascular Surgery

## 2015-12-17 ENCOUNTER — Ambulatory Visit: Payer: Medicare Other | Admitting: Vascular Surgery

## 2015-12-31 ENCOUNTER — Ambulatory Visit
Admission: RE | Admit: 2015-12-31 | Discharge: 2015-12-31 | Disposition: A | Discharge status: Home / Self Care | Payer: Medicare Other | Source: Ambulatory Visit | Attending: Vascular Surgery | Admitting: Vascular Surgery

## 2015-12-31 ENCOUNTER — Other Ambulatory Visit: Payer: Self-pay | Admitting: Vascular Surgery

## 2015-12-31 DIAGNOSIS — I713 Abdominal aortic aneurysm, ruptured, unspecified: Secondary | ICD-10-CM

## 2015-12-31 DIAGNOSIS — Z48812 Encounter for surgical aftercare following surgery on the circulatory system: Secondary | ICD-10-CM

## 2016-01-02 ENCOUNTER — Encounter: Payer: Self-pay | Admitting: Vascular Surgery

## 2016-01-07 ENCOUNTER — Encounter: Payer: Self-pay | Admitting: Vascular Surgery

## 2016-01-07 ENCOUNTER — Ambulatory Visit (INDEPENDENT_AMBULATORY_CARE_PROVIDER_SITE_OTHER): Payer: Medicare Other | Admitting: Vascular Surgery

## 2016-01-07 VITALS — BP 116/67 | HR 105 | Temp 97.3°F | Resp 16 | Ht 63.0 in | Wt 210.0 lb

## 2016-01-07 DIAGNOSIS — I713 Abdominal aortic aneurysm, ruptured, unspecified: Secondary | ICD-10-CM

## 2016-01-07 NOTE — Progress Notes (Signed)
[facility] [place]  Patient name: Beverly Lewis MRN: [id number] DOB: Sep 01, 1928 Sex: female  REASON FOR VISIT: Follow-up of stent graft repair of ruptured abdominal aortic aneurysm 2013  HPI: Beverly Lewis is a 82 y.o. female dose post stent graft repair of ruptured abdominal aortic aneurysm 2013 with Dr. Kellie Simmering. Here today for a follow-up discussion of recent noncontrast CT. She also has some pressure sores on her heel after nursing home visit that she is seeing Dr. Kellie Simmering 4 and April of this year and these have healed. She is here with family member today she is in a wheelchair and is nonambulatory.  Past Medical History  Diagnosis Date  . Hypertension   . Hyperlipidemia   . Cerebrovascular accident [facility]) history of mild cerebrovascular accident which caused some visual disturbance.   . Morbid obesity ([place])   . Lung mass     Right upper Lobe  . Shortness of breath   . Pneumonia     hx  . Peripheral vascular disease (HCC)     hx blood clot 30 yrs ago leg  . GERD (gastroesophageal reflux disease)   . Arthritis   . Pneumothorax, iatrogenic 8/2/113-discharge note    Post Biospy  . Cough with hemoptysis 01/22/12    Prior to discharge  . Tachycardia 01/22/12    Prior to discharge  . History of radiation therapy 02/18/12,02/23/12,02/25/12., 03/01/12,&03/03/12    RULlung 50Gy/5/fx  . AAA (abdominal aortic aneurysm) ([place])   . Lung cancer ([place]) 01/21/12  . Diabetes mellitus     Family History  Problem Relation Age of Onset  . Heart disease Mother   . Hypertension Mother   . Diabetes Mother   . Hyperlipidemia Mother   . Heart disease Father   . Hypertension Father   . Diabetes Father   . Hyperlipidemia Father   . Diabetes Daughter     SOCIAL HISTORY: Social History  Substance Use Topics  . Smoking status: Current Some Day Smoker -- 0.50 packs/day for 65 years    Types: Cigarettes  . Smokeless tobacco: Never  Used  . Alcohol Use: No     Comment: She does not consume alcohol on a regular basis.    Allergies  Allergen Reactions  . Asa Buff (Mag [Buffered Aspirin]     In tolerance in higher dosages.    Current Outpatient Prescriptions  Medication Sig Dispense Refill  . acetaminophen (TYLENOL) 500 MG tablet Take 500 mg by mouth every 6 (six) hours as needed for moderate pain (inflammation).    Marland Kitchen allopurinol (ZYLOPRIM) 100 MG tablet Take 100 mg by mouth daily.     Marland Kitchen aspirin EC 81 MG tablet Take 81 mg by mouth daily.    Marland Kitchen atorvastatin (LIPITOR) 20 MG tablet Take 20 mg by mouth daily.      . cholecalciferol (VITAMIN D) 1000 UNITS tablet Take 1,000 Units by mouth daily.    Marland Kitchen COLCRYS 0.6 MG tablet Take 0.6 mg by mouth 2 (two) times daily as needed (gout). For gout    . dexamethasone (DECADRON) 4 MG tablet Take 1 tablet by mouth the day before, the day of and the day after chemotherapy 30 tablet 0  . eprosartan (TEVETEN) 600 MG tablet As directed  5  . folic acid (FOLVITE) 1 MG tablet Take 1 mg by mouth daily.    Marland Kitchen gabapentin (NEURONTIN) 300 MG capsule As directed  5  . hydrochlorothiazide (HYDRODIURIL) 25 MG tablet As directed  5  . HYDROcodone-acetaminophen (NORCO/VICODIN) 5-325 MG tablet Take 1 tablet by mouth daily as needed. 30 tablet 0  . ibuprofen (ADVIL,MOTRIN) 200 MG tablet Take 800 mg by mouth every 6 (six) hours as needed for moderate pain.    Marland Kitchen JANUVIA 100 MG tablet Take 100 mg by mouth daily.     . metoprolol tartrate (LOPRESSOR) 25 MG tablet Take 0.5 tablets (12.5 mg total) by mouth 2 (two) times daily. 30 tablet 1  . mirtazapine (REMERON) 15 MG tablet As directed  0  . omeprazole (PRILOSEC) 20 MG capsule Take 20 mg by mouth daily.    Marland Kitchen oxycodone (OXY-IR) 5 MG capsule Take 1 capsule (5 mg total) by mouth every 4 (four) hours as needed. 30 capsule 0  . potassium chloride SA (K-DUR,KLOR-CON) 20 MEQ tablet Take 1 tablet (20 mEq total) by mouth 2 (two) times daily. 20 tablet 0   No  current facility-administered medications for this visit.    REVIEW OF SYSTEMS:  '[X]'$  denotes positive finding, '[ ]'$  denotes negative finding Cardiac  Comments:  Chest pain or chest pressure:    Shortness of breath upon exertion: x   Short of breath when lying flat:    Irregular heart rhythm:        Vascular    Pain in calf, thigh, or hip brought on by ambulation:    Pain in feet at night that wakes you up from your sleep:     Blood clot in your veins:    Leg swelling:  x       Pulmonary    Oxygen at home:    Productive cough:  x   Wheezing:         Neurologic    Sudden weakness in arms or legs:     Sudden numbness in arms or legs:     Sudden onset of difficulty speaking or slurred speech:    Temporary loss of vision in one eye:     Problems with dizziness:         Gastrointestinal    Blood in stool:     Vomited blood:         Genitourinary    Burning when urinating:     Blood in urine:        Psychiatric    Major depression:         Hematologic    Bleeding problems:    Problems with blood clotting too easily:        Skin    Rashes or ulcers:        Constitutional    Fever or chills:      PHYSICAL EXAM: Filed Vitals:   01/07/16 1112  BP: 116/67  Pulse: 105  Temp: 97.3 F (36.3 C)  TempSrc: Oral  Resp: 16  Height: '5\' 3"'$  (1.6 m)  Weight: 210 lb (95.255 kg)  SpO2: 93%    GENERAL: The patient is a well-nourished female, in no acute distress. The vital signs are documented above. VASCULAR: Palpable radial pulses bilaterally. Absent pedal pulses PULMONARY: There is good air exchange  ABDOMEN: Soft and non-tender  MUSCULOSKELETAL: There are no major deformities or cyanosis. NEUROLOGIC: No focal weakness or paresthesias are detected. SKIN: Changes consistent with severe venous hypertension bilaterally with hemosiderin deposits and thickening of her skin PSYCHIATRIC: The patient has a normal affect.  DATA:  Recent CT scan was reviewed and discussed with  the patient. This shows no expansion the sac size. The been no  change in her positioning of her stent graft. She does have some enlargement of the spiculated mass which is now 3.1 cm in her lung  MEDICAL ISSUES: Stable from the aneurysm standpoint. We will see her in one year with ultrasound. She has follow-up with Dr. Earlie Server regarding her lung cancer upcoming    Rosetta Posner, MD [facility] Tel [phone] Pager [phone]

## 2016-01-16 ENCOUNTER — Telehealth: Payer: Self-pay | Admitting: *Deleted

## 2016-01-16 ENCOUNTER — Telehealth: Payer: Self-pay | Admitting: Internal Medicine

## 2016-01-16 DIAGNOSIS — C3490 Malignant neoplasm of unspecified part of unspecified bronchus or lung: Secondary | ICD-10-CM

## 2016-01-16 NOTE — Telephone Encounter (Signed)
spoke w/ pt cofirmed 8/24 apt times, next available

## 2016-01-16 NOTE — Telephone Encounter (Signed)
[name] [place] called regarding pt's next f/u appt.  No f/u at this time, pt no showed April appt. POF to scheduling. Informed [name] [phone] to expect a call with new appt date/time.

## 2016-02-13 ENCOUNTER — Other Ambulatory Visit: Payer: Medicare Other

## 2016-02-13 ENCOUNTER — Telehealth: Payer: Self-pay | Admitting: Internal Medicine

## 2016-02-13 ENCOUNTER — Telehealth: Payer: Self-pay | Admitting: *Deleted

## 2016-02-13 ENCOUNTER — Ambulatory Visit: Payer: Medicare Other | Admitting: Internal Medicine

## 2016-02-13 NOTE — Telephone Encounter (Signed)
FYI Grandson called today at 8:38 to reschedule.  "She's not going to be able to make it today."  Cal transferred ext [phone].

## 2016-02-13 NOTE — Telephone Encounter (Signed)
02/13/2016 Appointment rescheduled to 03/09/2016 per patient request. Son called to cancel and reschedule appointment.

## 2016-03-09 ENCOUNTER — Ambulatory Visit (HOSPITAL_BASED_OUTPATIENT_CLINIC_OR_DEPARTMENT_OTHER): Payer: Medicare Other | Admitting: Internal Medicine

## 2016-03-09 ENCOUNTER — Other Ambulatory Visit (HOSPITAL_BASED_OUTPATIENT_CLINIC_OR_DEPARTMENT_OTHER): Payer: Medicare Other

## 2016-03-09 ENCOUNTER — Encounter: Payer: Self-pay | Admitting: Internal Medicine

## 2016-03-09 ENCOUNTER — Telehealth: Payer: Self-pay | Admitting: Internal Medicine

## 2016-03-09 VITALS — BP 127/62 | HR 79 | Temp 98.3°F | Resp 18 | Ht 63.0 in

## 2016-03-09 DIAGNOSIS — C3490 Malignant neoplasm of unspecified part of unspecified bronchus or lung: Secondary | ICD-10-CM

## 2016-03-09 DIAGNOSIS — C3491 Malignant neoplasm of unspecified part of right bronchus or lung: Secondary | ICD-10-CM

## 2016-03-09 DIAGNOSIS — C3411 Malignant neoplasm of upper lobe, right bronchus or lung: Secondary | ICD-10-CM | POA: Diagnosis not present

## 2016-03-09 DIAGNOSIS — M7989 Other specified soft tissue disorders: Secondary | ICD-10-CM | POA: Diagnosis not present

## 2016-03-09 DIAGNOSIS — M1990 Unspecified osteoarthritis, unspecified site: Secondary | ICD-10-CM

## 2016-03-09 LAB — CBC WITH DIFFERENTIAL/PLATELET
BASO%: 0.5 % (ref 0.0–2.0)
BASOS ABS: 0 10*3/uL (ref 0.0–0.1)
EOS ABS: 0.1 10*3/uL (ref 0.0–0.5)
EOS%: 2.3 % (ref 0.0–7.0)
HCT: 36.9 % (ref 34.8–46.6)
HGB: 11.8 g/dL (ref 11.6–15.9)
LYMPH%: 34.6 % (ref 14.0–49.7)
MCH: 31.6 pg (ref 25.1–34.0)
MCHC: 32 g/dL (ref 31.5–36.0)
MCV: 98.7 fL (ref 79.5–101.0)
MONO#: 0.6 10*3/uL (ref 0.1–0.9)
MONO%: 10.1 % (ref 0.0–14.0)
NEUT#: 2.9 10*3/uL (ref 1.5–6.5)
NEUT%: 52.5 % (ref 38.4–76.8)
PLATELETS: 166 10*3/uL (ref 145–400)
RBC: 3.74 10*6/uL (ref 3.70–5.45)
RDW: 15.6 % — ABNORMAL HIGH (ref 11.2–14.5)
WBC: 5.6 10*3/uL (ref 3.9–10.3)
lymph#: 1.9 10*3/uL (ref 0.9–3.3)

## 2016-03-09 LAB — COMPREHENSIVE METABOLIC PANEL
ALT: 12 U/L (ref 0–55)
ANION GAP: 9 meq/L (ref 3–11)
AST: 13 U/L (ref 5–34)
Albumin: 3.3 g/dL — ABNORMAL LOW (ref 3.5–5.0)
Alkaline Phosphatase: 145 U/L (ref 40–150)
BILIRUBIN TOTAL: 0.31 mg/dL (ref 0.20–1.20)
BUN: 26.4 mg/dL — ABNORMAL HIGH (ref 7.0–26.0)
CHLORIDE: 113 meq/L — AB (ref 98–109)
CO2: 22 meq/L (ref 22–29)
Calcium: 9.4 mg/dL (ref 8.4–10.4)
Creatinine: 1.1 mg/dL (ref 0.6–1.1)
EGFR: 54 mL/min/{1.73_m2} — AB (ref >90)
Glucose: 80 mg/dl (ref 70–140)
Potassium: 4.4 mEq/L (ref 3.5–5.1)
Sodium: 144 mEq/L (ref 136–145)
TOTAL PROTEIN: 7.9 g/dL (ref 6.4–8.3)

## 2016-03-09 NOTE — Progress Notes (Signed)
[place] Telephone:([phone]   [phone]) [phone]  OFFICE PROGRESS NOTE  [name], MD [place] [address]  DIAGNOSIS: Lung cancer  Primary site: Lung (Right)  Staging method: AJCC 7th Edition  Clinical: Stage IIIA (T3, N2, M0) signed by Curt Bears, MD on 01/01/2014 6:57 PM  Summary: Stage IIIA (T3, N2, M0)   PRIOR THERAPY:  1) palliative radiotherapy to the right upper lobe lung mass under the care of Dr. Pablo Ledger completed September 2013. 2) Systemic chemotherapy with carboplatin for an AUC of 4 and Alimta 400 mg/m2 given in 3 weeks. Status post 6 cycles, last dose was given 05/07/2014 with partial response.  3) Systemic chemotherapy with single agent Alimta 400 MG/M2 every 3 weeks. First dose given 09/17/2014. Status post 6 cycles, last dose was given on 02/06/2015 discontinued secondary to intolerance.   CURRENT THERAPY: Observation.  DISEASE STAGE:  Lung cancer  Primary site: Lung (Right)  Staging method: AJCC 7th Edition  Clinical: Stage IIIA (T3, N2, M0) signed by Curt Bears, MD on 01/01/2014 6:57 PM  Summary: Stage IIIA (T3, N2, M0)  CHEMOTHERAPY INTENT: palliative  CURRENT # OF CHEMOTHERAPY CYCLES: 6 CURRENT ANTIEMETICS: compazine  CURRENT SMOKING STATUS: current smoker  ORAL CHEMOTHERAPY AND CONSENT: n/a  CURRENT BISPHOSPHONATES USE: none  PAIN MANAGEMENT: hydrocodone  NARCOTICS INDUCED CONSTIPATION: none  LIVING WILL AND CODE STATUS:   INTERVAL HISTORY: Beverly Lewis 82 y.o. female returns to the clinic today for follow-up visit accompanied by . The patient has been observation since August 2016. She is feeling fine with no specific complaints today except for arthritis and swelling of her lower extremities. She also has shortness of breath with exertion. She denied having any significant fever or chills. She has no nausea or vomiting. She has swelling of the lower extremities  and currently on Lasix by her primary care physician. She had repeat CT scan of the chest in July 2017 and she missed her appointment at that time. She is here today for evaluation and recommendation regarding her condition.Marland Kitchen  MEDICAL HISTORY: Past Medical History:  Diagnosis Date  . AAA (abdominal aortic aneurysm) ([place])   . Arthritis   . Cerebrovascular accident [facility]) history of mild cerebrovascular accident which caused some visual disturbance.   . Cough with hemoptysis 01/22/12   Prior to discharge  . Diabetes mellitus   . GERD (gastroesophageal reflux disease)   . History of radiation therapy 02/18/12,02/23/12,02/25/12., 03/01/12,&03/03/12   RULlung 50Gy/5/fx  . Hyperlipidemia   . Hypertension   . Lung cancer ([place]) 01/21/12  . Lung mass    Right upper Lobe  . Morbid obesity ([place])   . Peripheral vascular disease (HCC)    hx blood clot 30 yrs ago leg  . Pneumonia    hx  . Pneumothorax, iatrogenic 8/2/113-discharge note   Post Biospy  . Shortness of breath   . Tachycardia 01/22/12   Prior to discharge    ALLERGIES:  is allergic to asa buff (mag [buffered aspirin].  MEDICATIONS:  Current Outpatient Prescriptions  Medication Sig Dispense Refill  . acetaminophen (TYLENOL) 500 MG tablet Take 500 mg by mouth every 6 (six) hours as needed for moderate pain (inflammation).    Marland Kitchen allopurinol (ZYLOPRIM) 100 MG tablet Take 100 mg by mouth daily.     Marland Kitchen aspirin EC 81 MG tablet Take 81 mg by mouth daily.    Marland Kitchen atorvastatin (LIPITOR) 20 MG tablet Take 20 mg by mouth  daily.      . cholecalciferol (VITAMIN D) 1000 UNITS tablet Take 1,000 Units by mouth daily.    Marland Kitchen COLCRYS 0.6 MG tablet Take 0.6 mg by mouth 2 (two) times daily as needed (gout). For gout    . dexamethasone (DECADRON) 4 MG tablet Take 1 tablet by mouth the day before, the day of and the day after chemotherapy 30 tablet 0  . eprosartan (TEVETEN) 600 MG tablet As directed  5  . folic acid (FOLVITE) 1 MG tablet Take 1 mg by mouth daily.      Marland Kitchen gabapentin (NEURONTIN) 300 MG capsule As directed  5  . hydrochlorothiazide (HYDRODIURIL) 25 MG tablet As directed  5  . HYDROcodone-acetaminophen (NORCO/VICODIN) 5-325 MG tablet Take 1 tablet by mouth daily as needed. 30 tablet 0  . ibuprofen (ADVIL,MOTRIN) 200 MG tablet Take 800 mg by mouth every 6 (six) hours as needed for moderate pain.    Marland Kitchen JANUVIA 100 MG tablet Take 100 mg by mouth daily.     . metoprolol tartrate (LOPRESSOR) 25 MG tablet Take 0.5 tablets (12.5 mg total) by mouth 2 (two) times daily. 30 tablet 1  . mirtazapine (REMERON) 15 MG tablet As directed  0  . omeprazole (PRILOSEC) 20 MG capsule Take 20 mg by mouth daily.    Marland Kitchen oxycodone (OXY-IR) 5 MG capsule Take 1 capsule (5 mg total) by mouth every 4 (four) hours as needed. 30 capsule 0  . potassium chloride SA (K-DUR,KLOR-CON) 20 MEQ tablet Take 1 tablet (20 mEq total) by mouth 2 (two) times daily. 20 tablet 0   No current facility-administered medications for this visit.     SURGICAL HISTORY:  Past Surgical History:  Procedure Laterality Date  . ABDOMINAL AORTIC ANEURYSM REPAIR  06/28/10   Stent Graft repair    . Aortogram   03/03/11   with stenting , left external iliac artery  . APPENDECTOMY    . EXPLORATORY LAPAROTOMY     For ectopic pregnancy  . LUMBAR DISC SURGERY    . Navigational Bronchoscopy     Right Upper Lobe Mass with Biopsies, Brushings, Washings, and bronchoalveolar Lavage:  . Paratracheal Adenopathy and Bilateral Adrenal Nodules      REVIEW OF SYSTEMS:  A comprehensive review of systems was negative except for: Constitutional: positive for fatigue Respiratory: positive for dyspnea on exertion Musculoskeletal: positive for arthralgias   PHYSICAL EXAMINATION: General appearance: alert, cooperative, fatigued and no distress Head: Normocephalic, without obvious abnormality, atraumatic Neck: no adenopathy, no JVD, supple, symmetrical, trachea midline and thyroid not enlarged, symmetric, no  tenderness/mass/nodules Lymph nodes: Cervical, supraclavicular, and axillary nodes normal. Resp: clear to auscultation bilaterally Back: symmetric, no curvature. ROM normal. No CVA tenderness. Cardio: regular rate and rhythm, S1, S2 normal, no murmur, click, rub or gallop GI: soft, non-tender; bowel sounds normal; no masses,  no organomegaly Extremities: edema 1+ Neurologic: Alert and oriented X 3, normal strength and tone. Normal symmetric reflexes. Normal coordination and gait  ECOG PERFORMANCE STATUS: 2 - Symptomatic, <50% confined to bed  Blood pressure 127/62, pulse 79, temperature 98.3 F (36.8 C), temperature source Oral, resp. rate 18, height '5\' 3"'$  (1.6 m).  LABORATORY DATA: Lab Results  Component Value Date   WBC 5.6 03/09/2016   HGB 11.8 03/09/2016   HCT 36.9 03/09/2016   MCV 98.7 03/09/2016   PLT 166 03/09/2016      Chemistry      Component Value Date/Time   NA 140 09/20/2015 1053   K  3.9 09/20/2015 1053   CL 106 02/14/2015 0433   CO2 25 09/20/2015 1053   BUN 18.2 09/20/2015 1053   CREATININE 0.9 09/20/2015 1053      Component Value Date/Time   CALCIUM 9.4 09/20/2015 1053   ALKPHOS 127 09/20/2015 1053   AST 12 09/20/2015 1053   ALT <9 09/20/2015 1053   BILITOT 0.37 09/20/2015 1053       RADIOGRAPHIC STUDIES: No results found.  ASSESSMENT AND PLAN: This is a very pleasant 82 years old African-American female with recurrent non-small cell lung cancer, adenocarcinoma. She is currently undergoing systemic chemotherapy with single agent Alimta status post 5 cycles and tolerating her treatment well. She completed another course of systemic chemotherapy was carboplatin and Alimta for 6 cycles and this was discontinued secondary to intolerance but the patient had no evidence for disease progression after completion of the 6 cycles. The patient is currently on observation and her last CT scan of the chest in July 2017 showed mild progression of the right upper lobe  lung mass. She is not a candidate for any systemic therapy at this point because of her poor performance status. I recommended for the patient to continue on observation and close monitoring. I will see her back for follow-up visit in 6 months with repeat CT scan of the chest without contrast. For the swelling of the lower extremity and arthritis, the patient was advised to continue treatment with Lasix as prescribed by her primary care physician. She was advised to call immediately if she has any concerning symptoms in the interval.  The patient voices understanding of current disease status and treatment options and is in agreement with the current care plan.  All questions were answered. The patient knows to call the clinic with any problems, questions or concerns. We can certainly see the patient much sooner if necessary.  Disclaimer: This note was dictated with voice recognition software. Similar sounding words can inadvertently be transcribed and may not be corrected upon review.

## 2016-03-09 NOTE — Telephone Encounter (Signed)
GAVE PATIENT/RELATIVE AVS REPORT AND APPOINTMENTS FOR MARCH 2018. [facility] WILL CALL RE SCANS - PATIENT/RELATIVE AWARE.

## 2016-05-08 NOTE — Addendum Note (Signed)
Addended by: Lianne Cure A on: 05/08/2016 02:03 PM   Modules accepted: Orders

## 2016-06-09 ENCOUNTER — Telehealth: Payer: Self-pay | Admitting: Medical Oncology

## 2016-06-09 NOTE — Telephone Encounter (Signed)
Needs records of stage prognosis. I called back and requested that [name] leave fax number so I can fax last OV.

## 2016-09-07 ENCOUNTER — Other Ambulatory Visit: Payer: Medicare Other

## 2016-09-07 ENCOUNTER — Ambulatory Visit (HOSPITAL_COMMUNITY): Admission: RE | Admit: 2016-09-07 | Payer: Medicare Other | Source: Ambulatory Visit

## 2016-09-07 ENCOUNTER — Telehealth: Payer: Self-pay | Admitting: Medical Oncology

## 2016-09-07 NOTE — Telephone Encounter (Signed)
Returned son's call and at his request I told him we will r/s lab , f/u and get Ct scheduled. Schedule request sent.

## 2016-09-08 ENCOUNTER — Telehealth: Payer: Self-pay | Admitting: Internal Medicine

## 2016-09-08 NOTE — Telephone Encounter (Signed)
Appts r/s per 3/19 sch msg from [place]. Grandson aware of appt change and will inform patient.

## 2016-09-14 ENCOUNTER — Ambulatory Visit: Payer: Medicare Other | Admitting: Internal Medicine

## 2016-09-15 ENCOUNTER — Other Ambulatory Visit (HOSPITAL_BASED_OUTPATIENT_CLINIC_OR_DEPARTMENT_OTHER): Payer: Medicare Other

## 2016-09-15 DIAGNOSIS — C3411 Malignant neoplasm of upper lobe, right bronchus or lung: Secondary | ICD-10-CM | POA: Diagnosis not present

## 2016-09-15 DIAGNOSIS — C3491 Malignant neoplasm of unspecified part of right bronchus or lung: Secondary | ICD-10-CM

## 2016-09-15 LAB — CBC WITH DIFFERENTIAL/PLATELET
BASO%: 1.4 % (ref 0.0–2.0)
BASOS ABS: 0.1 10*3/uL (ref 0.0–0.1)
EOS%: 1.5 % (ref 0.0–7.0)
Eosinophils Absolute: 0.1 10*3/uL (ref 0.0–0.5)
HCT: 35.8 % (ref 34.8–46.6)
HGB: 11.6 g/dL (ref 11.6–15.9)
LYMPH#: 1.7 10*3/uL (ref 0.9–3.3)
LYMPH%: 27.5 % (ref 14.0–49.7)
MCH: 31.9 pg (ref 25.1–34.0)
MCHC: 32.4 g/dL (ref 31.5–36.0)
MCV: 98.2 fL (ref 79.5–101.0)
MONO#: 0.6 10*3/uL (ref 0.1–0.9)
MONO%: 9 % (ref 0.0–14.0)
NEUT#: 3.7 10*3/uL (ref 1.5–6.5)
NEUT%: 60.6 % (ref 38.4–76.8)
PLATELETS: 193 10*3/uL (ref 145–400)
RBC: 3.65 10*6/uL — ABNORMAL LOW (ref 3.70–5.45)
RDW: 15.8 % — ABNORMAL HIGH (ref 11.2–14.5)
WBC: 6.2 10*3/uL (ref 3.9–10.3)

## 2016-09-15 LAB — COMPREHENSIVE METABOLIC PANEL
ALT: 10 U/L (ref 0–55)
ANION GAP: 9 meq/L (ref 3–11)
AST: 13 U/L (ref 5–34)
Albumin: 3 g/dL — ABNORMAL LOW (ref 3.5–5.0)
Alkaline Phosphatase: 156 U/L — ABNORMAL HIGH (ref 40–150)
BUN: 32.8 mg/dL — ABNORMAL HIGH (ref 7.0–26.0)
CHLORIDE: 110 meq/L — AB (ref 98–109)
CO2: 23 mEq/L (ref 22–29)
CREATININE: 1.3 mg/dL — AB (ref 0.6–1.1)
Calcium: 9 mg/dL (ref 8.4–10.4)
EGFR: 44 mL/min/{1.73_m2} — AB (ref >90)
Glucose: 98 mg/dl (ref 70–140)
POTASSIUM: 4.6 meq/L (ref 3.5–5.1)
Sodium: 142 mEq/L (ref 136–145)
Total Bilirubin: 0.23 mg/dL (ref 0.20–1.20)
Total Protein: 7.2 g/dL (ref 6.4–8.3)

## 2016-09-22 ENCOUNTER — Encounter: Payer: Self-pay | Admitting: Internal Medicine

## 2016-09-22 ENCOUNTER — Ambulatory Visit (HOSPITAL_BASED_OUTPATIENT_CLINIC_OR_DEPARTMENT_OTHER): Payer: Medicare Other | Admitting: Internal Medicine

## 2016-09-22 ENCOUNTER — Telehealth: Payer: Self-pay | Admitting: Internal Medicine

## 2016-09-22 VITALS — BP 97/48 | HR 90 | Temp 98.4°F | Resp 18 | Ht 63.0 in

## 2016-09-22 DIAGNOSIS — C3411 Malignant neoplasm of upper lobe, right bronchus or lung: Secondary | ICD-10-CM

## 2016-09-22 DIAGNOSIS — C3491 Malignant neoplasm of unspecified part of right bronchus or lung: Secondary | ICD-10-CM

## 2016-09-22 NOTE — Telephone Encounter (Signed)
Gave patient avs report and appointments for October as well as ct for 4/6 at [facility]. [facility] will call re scan for October.

## 2016-09-22 NOTE — Progress Notes (Signed)
[place] [phone]   Fax:(336) [phone]  OFFICE PROGRESS NOTE  [name], MD [place] [address]  DIAGNOSIS: Lung cancer  Primary site: Lung (Right)  Staging method: AJCC 7th Edition  Clinical: Stage IIIA (T3, N2, M0) signed by Curt Bears, MD on 01/01/2014 6:57 PM  Summary: Stage IIIA (T3, N2, M0)   PRIOR THERAPY:  1) palliative radiotherapy to the right upper lobe lung mass under the care of Dr. Pablo Ledger completed September 2013. 2) Systemic chemotherapy with carboplatin for an AUC of 4 and Alimta 400 mg/m2 given in 3 weeks. Status post 6 cycles, last dose was given 05/07/2014 with partial response.  3) Systemic chemotherapy with single agent Alimta 400 MG/M2 every 3 weeks. First dose given 09/17/2014. Status post 6 cycles, last dose was given on 02/06/2015 discontinued secondary to intolerance.   CURRENT THERAPY: Observation.  INTERVAL HISTORY: Beverly Lewis 83 y.o. female returns to the clinic today for six-month follow-up visit accompanied by her grandson. The patient is feeling fine today with no specific complaints except for mild swelling of the lower extremities in addition to the generalized fatigue and shortness of breath with exertion. She denied having any current chest pain, cough or hemoptysis. She has no weight loss or night sweats. She denied having any nausea, vomiting, diarrhea or constipation. She was supposed to have repeat CT scan of the chest on 09/06/2016 but this was not scheduled. The patient is here today for evaluation and repeat blood work.   MEDICAL HISTORY: Past Medical History:  Diagnosis Date  . AAA (abdominal aortic aneurysm) ([place])   . Arthritis   . Cerebrovascular accident [facility]) history of mild cerebrovascular accident which caused some visual disturbance.   . Cough with hemoptysis 01/22/12   Prior to discharge  . Diabetes mellitus   . GERD (gastroesophageal reflux  disease)   . History of radiation therapy 02/18/12,02/23/12,02/25/12., 03/01/12,&03/03/12   RULlung 50Gy/5/fx  . Hyperlipidemia   . Hypertension   . Lung cancer ([place]) 01/21/12  . Lung mass    Right upper Lobe  . Morbid obesity ([place])   . Peripheral vascular disease (HCC)    hx blood clot 30 yrs ago leg  . Pneumonia    hx  . Pneumothorax, iatrogenic 8/2/113-discharge note   Post Biospy  . Shortness of breath   . Tachycardia 01/22/12   Prior to discharge    ALLERGIES:  is allergic to asa buff (mag [buffered aspirin].  MEDICATIONS:  Current Outpatient Prescriptions  Medication Sig Dispense Refill  . acetaminophen (TYLENOL) 500 MG tablet Take 500 mg by mouth every 6 (six) hours as needed for moderate pain (inflammation).    Marland Kitchen allopurinol (ZYLOPRIM) 100 MG tablet Take 100 mg by mouth daily.     Marland Kitchen aspirin EC 81 MG tablet Take 81 mg by mouth daily.    Marland Kitchen atorvastatin (LIPITOR) 20 MG tablet Take 20 mg by mouth daily.      . cholecalciferol (VITAMIN D) 1000 UNITS tablet Take 1,000 Units by mouth daily.    Marland Kitchen COLCRYS 0.6 MG tablet Take 0.6 mg by mouth 2 (two) times daily as needed (gout). For gout    . dexamethasone (DECADRON) 4 MG tablet Take 1 tablet by mouth the day before, the day of and the day after chemotherapy 30 tablet 0  . eprosartan (TEVETEN) 600 MG tablet As directed  5  . folic acid (FOLVITE) 1 MG tablet Take 1 mg by  mouth daily.    Marland Kitchen gabapentin (NEURONTIN) 300 MG capsule As directed  5  . hydrochlorothiazide (HYDRODIURIL) 25 MG tablet As directed  5  . HYDROcodone-acetaminophen (NORCO/VICODIN) 5-325 MG tablet Take 1 tablet by mouth daily as needed. 30 tablet 0  . ibuprofen (ADVIL,MOTRIN) 200 MG tablet Take 800 mg by mouth every 6 (six) hours as needed for moderate pain.    Marland Kitchen JANUVIA 100 MG tablet Take 100 mg by mouth daily.     . metoprolol tartrate (LOPRESSOR) 25 MG tablet Take 0.5 tablets (12.5 mg total) by mouth 2 (two) times daily. 30 tablet 1  . mirtazapine (REMERON) 15 MG tablet  As directed  0  . omeprazole (PRILOSEC) 20 MG capsule Take 20 mg by mouth daily.    Marland Kitchen oxycodone (OXY-IR) 5 MG capsule Take 1 capsule (5 mg total) by mouth every 4 (four) hours as needed. 30 capsule 0  . potassium chloride SA (K-DUR,KLOR-CON) 20 MEQ tablet Take 1 tablet (20 mEq total) by mouth 2 (two) times daily. 20 tablet 0   No current facility-administered medications for this visit.     SURGICAL HISTORY:  Past Surgical History:  Procedure Laterality Date  . ABDOMINAL AORTIC ANEURYSM REPAIR  06/28/10   Stent Graft repair    . Aortogram   03/03/11   with stenting , left external iliac artery  . APPENDECTOMY    . EXPLORATORY LAPAROTOMY     For ectopic pregnancy  . LUMBAR DISC SURGERY    . Navigational Bronchoscopy     Right Upper Lobe Mass with Biopsies, Brushings, Washings, and bronchoalveolar Lavage:  . Paratracheal Adenopathy and Bilateral Adrenal Nodules      REVIEW OF SYSTEMS:  A comprehensive review of systems was negative except for: Constitutional: positive for fatigue Respiratory: positive for dyspnea on exertion Musculoskeletal: positive for muscle weakness   PHYSICAL EXAMINATION: General appearance: alert, cooperative, fatigued and no distress Head: Normocephalic, without obvious abnormality, atraumatic Neck: no adenopathy, no JVD, supple, symmetrical, trachea midline and thyroid not enlarged, symmetric, no tenderness/mass/nodules Lymph nodes: Cervical, supraclavicular, and axillary nodes normal. Resp: clear to auscultation bilaterally Back: symmetric, no curvature. ROM normal. No CVA tenderness. Cardio: regular rate and rhythm, S1, S2 normal, no murmur, click, rub or gallop GI: soft, non-tender; bowel sounds normal; no masses,  no organomegaly Extremities: edema 1+  ECOG PERFORMANCE STATUS: 2 - Symptomatic, <50% confined to bed  Blood pressure (!) 97/48, pulse 90, temperature 98.4 F (36.9 C), temperature source Oral, resp. rate 18, height '5\' 3"'$  (1.6 m), SpO2 100  %.  LABORATORY DATA: Lab Results  Component Value Date   WBC 6.2 09/15/2016   HGB 11.6 09/15/2016   HCT 35.8 09/15/2016   MCV 98.2 09/15/2016   PLT 193 09/15/2016      Chemistry      Component Value Date/Time   NA 142 09/15/2016 0929   K 4.6 09/15/2016 0929   CL 106 02/14/2015 0433   CO2 23 09/15/2016 0929   BUN 32.8 (H) 09/15/2016 0929   CREATININE 1.3 (H) 09/15/2016 0929      Component Value Date/Time   CALCIUM 9.0 09/15/2016 0929   ALKPHOS 156 (H) 09/15/2016 0929   AST 13 09/15/2016 0929   ALT 10 09/15/2016 0929   BILITOT 0.23 09/15/2016 0929       RADIOGRAPHIC STUDIES: No results found.  ASSESSMENT AND PLAN:  This is a very pleasant 83 years old [place] American female with recurrent non-small cell lung cancer, adenocarcinoma status post systemic  chemotherapy with single agent Alimta for 5 cycles and tolerated the treatment well. She had disease progression and she was treated with a course of carboplatin and Alimta for 6 cycles discontinued secondary to intolerance but no evidence of disease progression after cycle #6. She has been on observation for the last 6 months and the patient has no concerning complaints except for mild shortness breath and fatigue. I recommended for the patient to have repeat CT scan of the chest performed in the next few days and if no evidence for disease progression, I would see her back for follow-up visit in 6 months for reevaluation with repeat CT scan of the chest again without contrast. She was advised to call immediately if she has any concerning symptoms in the interval. The patient voices understanding of current disease status and treatment options and is in agreement with the current care plan. All questions were answered. The patient knows to call the clinic with any problems, questions or concerns. We can certainly see the patient much sooner if necessary. I spent 10 minutes counseling the patient face to face. The total time  spent in the appointment was 15 minutes.  Disclaimer: This note was dictated with voice recognition software. Similar sounding words can inadvertently be transcribed and may not be corrected upon review.

## 2016-09-25 ENCOUNTER — Ambulatory Visit (HOSPITAL_COMMUNITY): Admission: RE | Admit: 2016-09-25 | Payer: Medicare Other | Source: Ambulatory Visit

## 2016-10-01 ENCOUNTER — Ambulatory Visit: Payer: Medicare Other | Admitting: Podiatry

## 2016-10-02 ENCOUNTER — Ambulatory Visit (HOSPITAL_COMMUNITY): Payer: Medicare Other

## 2016-10-06 ENCOUNTER — Ambulatory Visit (HOSPITAL_COMMUNITY)
Admission: RE | Admit: 2016-10-06 | Discharge: 2016-10-06 | Disposition: A | Discharge status: Home / Self Care | Payer: Medicare Other | Source: Ambulatory Visit | Attending: Internal Medicine | Admitting: Internal Medicine

## 2016-10-06 DIAGNOSIS — C3491 Malignant neoplasm of unspecified part of right bronchus or lung: Secondary | ICD-10-CM | POA: Diagnosis present

## 2016-10-06 DIAGNOSIS — I700 Atherosclerosis of aorta: Secondary | ICD-10-CM | POA: Diagnosis not present

## 2016-10-06 DIAGNOSIS — E279 Disorder of adrenal gland, unspecified: Secondary | ICD-10-CM | POA: Insufficient documentation

## 2016-10-06 DIAGNOSIS — I2510 Atherosclerotic heart disease of native coronary artery without angina pectoris: Secondary | ICD-10-CM | POA: Diagnosis not present

## 2016-10-06 DIAGNOSIS — R590 Localized enlarged lymph nodes: Secondary | ICD-10-CM | POA: Insufficient documentation

## 2016-10-06 DIAGNOSIS — I77819 Aortic ectasia, unspecified site: Secondary | ICD-10-CM | POA: Insufficient documentation

## 2016-10-06 DIAGNOSIS — C7802 Secondary malignant neoplasm of left lung: Secondary | ICD-10-CM | POA: Insufficient documentation

## 2016-11-18 ENCOUNTER — Telehealth: Payer: Self-pay | Admitting: *Deleted

## 2016-11-18 NOTE — Telephone Encounter (Signed)
Received call from [facility] stating pt has been trying to contact office for results of CT & tried to transfer pt to this #.  Unable to get speak to pt. Call did not transfer.  Message routed to Dr Mohamed/Pod RN.

## 2016-11-19 ENCOUNTER — Telehealth: Payer: Self-pay | Admitting: Internal Medicine

## 2016-11-19 NOTE — Telephone Encounter (Signed)
Per MD, pt to follow up with MD to discuss results. Message to Scheduling for pt appt.

## 2016-11-19 NOTE — Telephone Encounter (Signed)
Scheduled appt per 5/31 sch message from [facility] - patient is aware of appt date and time.

## 2016-12-08 ENCOUNTER — Telehealth: Payer: Self-pay | Admitting: Internal Medicine

## 2016-12-08 ENCOUNTER — Ambulatory Visit: Payer: Medicare Other | Admitting: Internal Medicine

## 2016-12-08 NOTE — Telephone Encounter (Signed)
Scheduled appt per sch message from Rn Diane 6/19 - unable to leave message - sent reminder letter in the mail.

## 2016-12-14 ENCOUNTER — Other Ambulatory Visit: Payer: Self-pay | Admitting: *Deleted

## 2016-12-14 DIAGNOSIS — C3490 Malignant neoplasm of unspecified part of unspecified bronchus or lung: Secondary | ICD-10-CM

## 2016-12-15 ENCOUNTER — Encounter: Payer: Self-pay | Admitting: Internal Medicine

## 2016-12-15 ENCOUNTER — Ambulatory Visit (HOSPITAL_BASED_OUTPATIENT_CLINIC_OR_DEPARTMENT_OTHER): Payer: Medicare Other | Admitting: Internal Medicine

## 2016-12-15 ENCOUNTER — Telehealth: Payer: Self-pay | Admitting: Internal Medicine

## 2016-12-15 ENCOUNTER — Other Ambulatory Visit (HOSPITAL_BASED_OUTPATIENT_CLINIC_OR_DEPARTMENT_OTHER): Payer: Medicare Other

## 2016-12-15 ENCOUNTER — Encounter: Payer: Self-pay | Admitting: *Deleted

## 2016-12-15 VITALS — BP 125/65 | HR 82 | Temp 98.7°F | Resp 18 | Ht 63.0 in | Wt 210.0 lb

## 2016-12-15 DIAGNOSIS — R05 Cough: Secondary | ICD-10-CM | POA: Diagnosis not present

## 2016-12-15 DIAGNOSIS — R531 Weakness: Secondary | ICD-10-CM | POA: Diagnosis not present

## 2016-12-15 DIAGNOSIS — R0602 Shortness of breath: Secondary | ICD-10-CM

## 2016-12-15 DIAGNOSIS — E279 Disorder of adrenal gland, unspecified: Secondary | ICD-10-CM

## 2016-12-15 DIAGNOSIS — R5383 Other fatigue: Secondary | ICD-10-CM

## 2016-12-15 DIAGNOSIS — C3411 Malignant neoplasm of upper lobe, right bronchus or lung: Secondary | ICD-10-CM | POA: Diagnosis not present

## 2016-12-15 DIAGNOSIS — R5382 Chronic fatigue, unspecified: Secondary | ICD-10-CM

## 2016-12-15 DIAGNOSIS — C3491 Malignant neoplasm of unspecified part of right bronchus or lung: Secondary | ICD-10-CM

## 2016-12-15 DIAGNOSIS — C3490 Malignant neoplasm of unspecified part of unspecified bronchus or lung: Secondary | ICD-10-CM

## 2016-12-15 HISTORY — DX: Chronic fatigue, unspecified: R53.82

## 2016-12-15 LAB — COMPREHENSIVE METABOLIC PANEL
ALT: 9 U/L (ref 0–55)
AST: 13 U/L (ref 5–34)
Albumin: 2.9 g/dL — ABNORMAL LOW (ref 3.5–5.0)
Alkaline Phosphatase: 181 U/L — ABNORMAL HIGH (ref 40–150)
Anion Gap: 7 mEq/L (ref 3–11)
BUN: 30.7 mg/dL — ABNORMAL HIGH (ref 7.0–26.0)
CHLORIDE: 107 meq/L (ref 98–109)
CO2: 25 meq/L (ref 22–29)
Calcium: 9.3 mg/dL (ref 8.4–10.4)
Creatinine: 1.2 mg/dL — ABNORMAL HIGH (ref 0.6–1.1)
EGFR: 47 mL/min/{1.73_m2} — AB (ref >90)
GLUCOSE: 84 mg/dL (ref 70–140)
POTASSIUM: 4.9 meq/L (ref 3.5–5.1)
SODIUM: 139 meq/L (ref 136–145)
Total Bilirubin: 0.35 mg/dL (ref 0.20–1.20)
Total Protein: 7.4 g/dL (ref 6.4–8.3)

## 2016-12-15 LAB — CBC WITH DIFFERENTIAL/PLATELET
BASO%: 1.1 % (ref 0.0–2.0)
Basophils Absolute: 0.1 10*3/uL (ref 0.0–0.1)
EOS ABS: 0.1 10*3/uL (ref 0.0–0.5)
EOS%: 1.9 % (ref 0.0–7.0)
HCT: 39.8 % (ref 34.8–46.6)
HGB: 12.7 g/dL (ref 11.6–15.9)
LYMPH%: 30.2 % (ref 14.0–49.7)
MCH: 31.2 pg (ref 25.1–34.0)
MCHC: 31.9 g/dL (ref 31.5–36.0)
MCV: 97.7 fL (ref 79.5–101.0)
MONO#: 0.6 10*3/uL (ref 0.1–0.9)
MONO%: 10.4 % (ref 0.0–14.0)
NEUT%: 56.4 % (ref 38.4–76.8)
NEUTROS ABS: 3.2 10*3/uL (ref 1.5–6.5)
Platelets: 255 10*3/uL (ref 145–400)
RBC: 4.08 10*6/uL (ref 3.70–5.45)
RDW: 16.4 % — ABNORMAL HIGH (ref 11.2–14.5)
WBC: 5.7 10*3/uL (ref 3.9–10.3)
lymph#: 1.7 10*3/uL (ref 0.9–3.3)

## 2016-12-15 NOTE — Progress Notes (Signed)
START ON PATHWAY REGIMEN - Non-Small Cell Lung     A cycle is every 14 days:     Nivolumab   **Always confirm dose/schedule in your pharmacy ordering system**    Patient Characteristics: Stage IV Metastatic, Non Squamous, Second Line - Chemotherapy/Immunotherapy, PS = 2, No Prior PD-1/PD-L1  Inhibitor and Immunotherapy Candidate AJCC T Category: T3 Current Disease Status: Distant Metastases AJCC N Category: N2 AJCC M Category: M1a AJCC 8 Stage Grouping: IVA Histology: Non Squamous Cell ROS1 Rearrangement Status: Did Not Order Test T790M Mutation Status: Not Applicable - EGFR Mutation Negative/Unknown Other Mutations/Biomarkers: No Other Actionable Mutations PD-L1 Expression Status: Did Not Order Test Chemotherapy/Immunotherapy LOT: Second Line Chemotherapy/Immunotherapy Molecular Targeted Therapy: Not Appropriate ALK Translocation Status: Negative Would you be surprised if this patient died  in the next year? I would NOT be surprised if this patient died in the next year EGFR Mutation Status: Negative/Wild Type BRAF [id number] Mutation Status: Did Not Order Test Performance Status: PS = 2 Immunotherapy Candidate Status: Candidate for Immunotherapy Prior Immunotherapy Status: No Prior PD-1/PD-L1 Inhibitor Intent of Therapy: Non-Curative / Palliative Intent, Discussed with Patient

## 2016-12-15 NOTE — Telephone Encounter (Signed)
Scheduled appt per 6/26 los - Gave patient AVS and calender per los.

## 2016-12-15 NOTE — Progress Notes (Signed)
Oncology Nurse Navigator Documentation  Oncology Nurse Navigator Flowsheets 12/15/2016  Navigator Location [facility]  Navigator Encounter Type Clinic/MDC/I spoke with patient and her family today at [facility].  She has missed several appt due to her son work.  I gave and explained information on transportation with [employer], her insurance, and gave them CSW card to call if those options did not work for them.   Patient Visit Type MedOnc  Treatment Phase Treatment  Barriers/Navigation Needs Transportation  Interventions Transportation  Acuity Level 2  Time Spent with Patient 30

## 2016-12-15 NOTE — Progress Notes (Signed)
[place] Telephone:(336) [phone]   Fax:(336) [phone]  OFFICE PROGRESS NOTE  Nolene Ebbs, MD [place] [address]  DIAGNOSIS: Lung cancer  Primary site: Lung (Right)  Staging method: AJCC 7th Edition  Clinical: Stage IIIA (T3, N2, M0) signed by Curt Bears, MD on 01/01/2014 6:57 PM  Summary: Stage IIIA (T3, N2, M0)   PRIOR THERAPY:  1) palliative radiotherapy to the right upper lobe lung mass under the care of Dr. Pablo Ledger completed September 2013. 2) Systemic chemotherapy with carboplatin for an AUC of 4 and Alimta 400 mg/m2 given in 3 weeks. Status post 6 cycles, last dose was given 05/07/2014 with partial response.  3) Systemic chemotherapy with single agent Alimta 400 MG/M2 every 3 weeks. First dose given 09/17/2014. Status post 6 cycles, last dose was given on 02/06/2015 discontinued secondary to intolerance.   CURRENT THERAPY: Second line treatment with immunotherapy with Nivolumab 240 mg IV every 2 weeks first dose 12/30/2016.  INTERVAL HISTORY: Beverly Lewis 83 y.o. female returns to the clinic today for follow-up visit accompanied by her grandson. The patient has been complaining of increasing fatigue and weakness. She also has shortness of breath and cough with no recent hemoptysis. She denied having any weight loss or night sweats. She has no fever or chills. She denied having any nausea, vomiting, diarrhea or constipation. She had CT scan of the chest performed in April but she missed several of her appointment and she was unable to come for evaluation and discussion of her treatment options until today.    MEDICAL HISTORY: Past Medical History:  Diagnosis Date  . AAA (abdominal aortic aneurysm) ([place])   . Arthritis   . Cerebrovascular accident [facility]) history of mild cerebrovascular accident which caused some visual disturbance.   . Cough with hemoptysis 01/22/12   Prior to discharge  . Diabetes mellitus   .  GERD (gastroesophageal reflux disease)   . History of radiation therapy 02/18/12,02/23/12,02/25/12., 03/01/12,&03/03/12   RULlung 50Gy/5/fx  . Hyperlipidemia   . Hypertension   . Lung cancer ([place]) 01/21/12  . Lung mass    Right upper Lobe  . Morbid obesity ([place])   . Peripheral vascular disease (H[facility])    hx blood clot 30 yrs ago leg  . Pneumonia    hx  . Pneumothorax, iatrogenic 8/2/113-discharge note   Post Biospy  . Shortness of breath   . Tachycardia 01/22/12   Prior to discharge    ALLERGIES:  is allergic to asa buff (mag [buffered aspirin].  MEDICATIONS:  Current Outpatient Prescriptions  Medication Sig Dispense Refill  . acetaminophen (TYLENOL) 500 MG tablet Take 500 mg by mouth every 6 (six) hours as needed for moderate pain (inflammation).    Marland Kitchen allopurinol (ZYLOPRIM) 100 MG tablet Take 100 mg by mouth daily.     Marland Kitchen aspirin EC 81 MG tablet Take 81 mg by mouth daily.    Marland Kitchen atorvastatin (LIPITOR) 20 MG tablet Take 20 mg by mouth daily.      . cholecalciferol (VITAMIN D) 1000 UNITS tablet Take 1,000 Units by mouth daily.    Marland Kitchen COLCRYS 0.6 MG tablet Take 0.6 mg by mouth 2 (two) times daily as needed (gout). For gout    . eprosartan (TEVETEN) 600 MG tablet As directed  5  . folic acid (FOLVITE) 1 MG tablet Take 1 mg by mouth daily.    Marland Kitchen gabapentin (NEURONTIN) 300 MG capsule As directed  5  . hydrochlorothiazide (HYDRODIURIL)  25 MG tablet As directed  5  . HYDROcodone-acetaminophen (NORCO/VICODIN) 5-325 MG tablet Take 1 tablet by mouth daily as needed. 30 tablet 0  . ibuprofen (ADVIL,MOTRIN) 200 MG tablet Take 800 mg by mouth every 6 (six) hours as needed for moderate pain.    Marland Kitchen JANUVIA 100 MG tablet Take 100 mg by mouth daily.     . metoprolol tartrate (LOPRESSOR) 25 MG tablet Take 0.5 tablets (12.5 mg total) by mouth 2 (two) times daily. 30 tablet 1  . mirtazapine (REMERON) 15 MG tablet As directed  0  . omeprazole (PRILOSEC) 20 MG capsule Take 20 mg by mouth daily.    . potassium  chloride SA (K-DUR,KLOR-CON) 20 MEQ tablet Take 1 tablet (20 mEq total) by mouth 2 (two) times daily. 20 tablet 0   No current facility-administered medications for this visit.     SURGICAL HISTORY:  Past Surgical History:  Procedure Laterality Date  . ABDOMINAL AORTIC ANEURYSM REPAIR  06/28/10   Stent Graft repair    . Aortogram   03/03/11   with stenting , left external iliac artery  . APPENDECTOMY    . EXPLORATORY LAPAROTOMY     For ectopic pregnancy  . LUMBAR DISC SURGERY    . Navigational Bronchoscopy     Right Upper Lobe Mass with Biopsies, Brushings, Washings, and bronchoalveolar Lavage:  . Paratracheal Adenopathy and Bilateral Adrenal Nodules      REVIEW OF SYSTEMS:  Constitutional: positive for fatigue Eyes: negative Ears, nose, mouth, throat, and face: negative Respiratory: positive for cough and dyspnea on exertion Cardiovascular: negative Gastrointestinal: negative Genitourinary:negative Integument/breast: negative Hematologic/lymphatic: negative Musculoskeletal:positive for arthralgias and muscle weakness Neurological: negative Behavioral/Psych: negative Endocrine: negative Allergic/Immunologic: negative   PHYSICAL EXAMINATION: General appearance: alert, cooperative, fatigued and no distress Head: Normocephalic, without obvious abnormality, atraumatic Neck: no adenopathy, no JVD, supple, symmetrical, trachea midline and thyroid not enlarged, symmetric, no tenderness/mass/nodules Lymph nodes: Cervical, supraclavicular, and axillary nodes normal. Resp: clear to auscultation bilaterally Back: symmetric, no curvature. ROM normal. No CVA tenderness. Cardio: regular rate and rhythm, S1, S2 normal, no murmur, click, rub or gallop GI: soft, non-tender; bowel sounds normal; no masses,  no organomegaly Extremities: edema 1+ Neurologic: Alert and oriented X 3, normal strength and tone. Normal symmetric reflexes. Normal coordination and gait  ECOG PERFORMANCE STATUS: 2  - Symptomatic, <50% confined to bed  Blood pressure 125/65, pulse 82, temperature 98.7 F (37.1 C), temperature source Oral, resp. rate 18, height 5\' 3"  (1.6 m), weight 210 lb (95.3 kg), SpO2 91 %.  LABORATORY DATA: Lab Results  Component Value Date   WBC 5.7 12/15/2016   HGB 12.7 12/15/2016   HCT 39.8 12/15/2016   MCV 97.7 12/15/2016   PLT 255 12/15/2016      Chemistry      Component Value Date/Time   NA 139 12/15/2016 1040   K 4.9 12/15/2016 1040   CL 106 02/14/2015 0433   CO2 25 12/15/2016 1040   BUN 30.7 (H) 12/15/2016 1040   CREATININE 1.2 (H) 12/15/2016 1040      Component Value Date/Time   CALCIUM 9.3 12/15/2016 1040   ALKPHOS 181 (H) 12/15/2016 1040   AST 13 12/15/2016 1040   ALT 9 12/15/2016 1040   BILITOT 0.35 12/15/2016 1040       RADIOGRAPHIC STUDIES: Ct Chest Wo Contrast  Result Date: 10/06/2016 CLINICAL DATA:  Stage IIIA right upper lobe lung adenocarcinoma diagnosed 2013 status post palliative radiotherapy and chemotherapy. Interval observation. Chemotherapy discontinued due  to intolerance. EXAM: CT CHEST WITHOUT CONTRAST TECHNIQUE: Multidetector CT imaging of the chest was performed following the standard protocol without IV contrast. COMPARISON:  09/20/2015 chest CT. FINDINGS: Cardiovascular: Normal heart size. No significant pericardial fluid/thickening. Left main, left anterior descending and left circumflex coronary atherosclerosis. Atherosclerotic thoracic aorta with stable 4.3 cm ectatic ascending thoracic aorta. Stable dilated main pulmonary artery (3.9 cm diameter). Mediastinum/Nodes: No discrete thyroid nodules. Unremarkable esophagus. No axillary adenopathy. Enlarged 1.3 cm right lower paratracheal node (series 2/ image 47), previously 1.2 cm, not appreciably changed. No additional pathologically enlarged mediastinal or gross hilar nodes on this noncontrast scan. Lungs/Pleura: No pneumothorax. No pleural effusion. Central right upper lobe 4.4 x 3.3 cm  lung mass (series 2/ image 49), increased from 3.6 x 3.0 cm. Consolidative airspace disease with associated volume loss in the posterior right upper lobe (series 7/ image 34) is increased in density although unchanged in extent, favored to represent postradiation change, although a component of recurrent tumor cannot be excluded given the increased density . Numerous (at least 10) irregular solid pulmonary nodules/masses with ground-glass halos throughout both lungs, significantly increased in size and number. For example, an irregular 8.6 x 5.5 cm anterior right lower lobe mass (series 7/image 76) is markedly increased from 1.3 x 1.3 cm. Posterior left upper lobe 1.8 x 1.3 cm nodule (series 7/image 47) is increased from 0.9 x 0.8 cm. Irregular 2.3 x 2.1 cm central left lower lobe nodule (series 7/image 70) is new. Upper abdomen: Simple 1.4 cm left liver cyst. Simple 1.2 cm upper right renal cyst. Enlarging 3.3 cm left adrenal mass (series 2/image 111), previously 2.6 cm. Stable 2.0 cm right adrenal adenoma (series 2/image 113). Musculoskeletal: No aggressive appearing focal osseous lesions. Marked thoracic spondylosis. IMPRESSION: 1. Significant disease progression, with interval growth of locally recurrent central right upper lobe lung mass and significant progression of bilateral pulmonary metastases. 2. Stable right paratracheal adenopathy. 3. Enlarging 3.3 cm left adrenal mass, suspicious for collision of adrenal metastasis with pre-existing adrenal adenoma. 4. Aortic atherosclerosis. Stable ectatic 4.3 cm ascending thoracic aorta. 5. Left main and 2 vessel coronary atherosclerosis. Electronically Signed   By: Ilona Sorrel M.D.   On: 10/06/2016 17:05   ASSESSMENT AND PLAN:  This is a very pleasant 83 years old African-American female with recurrent non-small cell lung cancer, adenocarcinoma status post systemic chemotherapy with carboplatin and Alimta discontinued secondary to intolerance.  She hasn't  observation for more than a year. Recent imaging studies in April 2018 showed evidence for disease progression with interval growth of locally recurrent central right upper lobe lung mass and significant progression of bilateral pulmonary metastasis. There was also enlarging of left adrenal mass suspicious for adrenal metastasis. I had a lengthy discussion with the patient and her grandson about her condition. I explained to her that she has incurable condition and on the treatment will be off palliative nature. I gave the patient the option of palliative care and hospice referral versus consideration of treatment with second line immunotherapy with Nivolumab 240 mg IV every 2 weeks. I discussed with the patient and her grandson the adverse effect of this treatment including but not limited to immune mediated the skin rash, diarrhea, or inflammation of the lung, kidney, liver, thyroid or other endocrine dysfunction. I also provided him with handouts about Nivolumab. The patient is interested in the treatment with immunotherapy and she is expected to start the first dose of this treatment on 12/30/2016. I would see her back for  follow-up visit with the start of cycle #2 for evaluation. She was advised to call immediately if she has any concerning symptoms in the interval. The patient voices understanding of current disease status and treatment options and is in agreement with the current care plan. All questions were answered. The patient knows to call the clinic with any problems, questions or concerns. We can certainly see the patient much sooner if necessary.   Disclaimer: This note was dictated with voice recognition software. Similar sounding words can inadvertently be transcribed and may not be corrected upon review.

## 2016-12-15 NOTE — Telephone Encounter (Signed)
Patient's grandson called and said they would be about 15-20 mins late

## 2016-12-21 ENCOUNTER — Encounter (HOSPITAL_COMMUNITY): Payer: Self-pay | Admitting: *Deleted

## 2016-12-21 ENCOUNTER — Emergency Department (HOSPITAL_COMMUNITY): Payer: Medicare Other

## 2016-12-21 ENCOUNTER — Inpatient Hospital Stay (HOSPITAL_COMMUNITY)
Admission: EM | Admit: 2016-12-21 | Discharge: 2016-12-25 | DRG: 190 | Disposition: A | Discharge status: Home / Self Care | Payer: Medicare Other | Attending: Internal Medicine | Admitting: Internal Medicine

## 2016-12-21 DIAGNOSIS — E875 Hyperkalemia: Secondary | ICD-10-CM | POA: Diagnosis present

## 2016-12-21 DIAGNOSIS — I712 Thoracic aortic aneurysm, without rupture: Secondary | ICD-10-CM | POA: Diagnosis present

## 2016-12-21 DIAGNOSIS — R5382 Chronic fatigue, unspecified: Secondary | ICD-10-CM | POA: Diagnosis present

## 2016-12-21 DIAGNOSIS — M1A09X0 Idiopathic chronic gout, multiple sites, without tophus (tophi): Secondary | ICD-10-CM | POA: Diagnosis present

## 2016-12-21 DIAGNOSIS — Z7982 Long term (current) use of aspirin: Secondary | ICD-10-CM

## 2016-12-21 DIAGNOSIS — C7972 Secondary malignant neoplasm of left adrenal gland: Secondary | ICD-10-CM | POA: Diagnosis present

## 2016-12-21 DIAGNOSIS — R627 Adult failure to thrive: Secondary | ICD-10-CM | POA: Diagnosis present

## 2016-12-21 DIAGNOSIS — Z87891 Personal history of nicotine dependence: Secondary | ICD-10-CM

## 2016-12-21 DIAGNOSIS — I10 Essential (primary) hypertension: Secondary | ICD-10-CM | POA: Diagnosis present

## 2016-12-21 DIAGNOSIS — R109 Unspecified abdominal pain: Secondary | ICD-10-CM | POA: Diagnosis not present

## 2016-12-21 DIAGNOSIS — Z8249 Family history of ischemic heart disease and other diseases of the circulatory system: Secondary | ICD-10-CM

## 2016-12-21 DIAGNOSIS — J9621 Acute and chronic respiratory failure with hypoxia: Secondary | ICD-10-CM

## 2016-12-21 DIAGNOSIS — K219 Gastro-esophageal reflux disease without esophagitis: Secondary | ICD-10-CM | POA: Diagnosis present

## 2016-12-21 DIAGNOSIS — J9601 Acute respiratory failure with hypoxia: Secondary | ICD-10-CM

## 2016-12-21 DIAGNOSIS — J441 Chronic obstructive pulmonary disease with (acute) exacerbation: Secondary | ICD-10-CM | POA: Diagnosis not present

## 2016-12-21 DIAGNOSIS — R0789 Other chest pain: Secondary | ICD-10-CM

## 2016-12-21 DIAGNOSIS — J9801 Acute bronchospasm: Secondary | ICD-10-CM

## 2016-12-21 DIAGNOSIS — T451X5A Adverse effect of antineoplastic and immunosuppressive drugs, initial encounter: Secondary | ICD-10-CM

## 2016-12-21 DIAGNOSIS — Z8349 Family history of other endocrine, nutritional and metabolic diseases: Secondary | ICD-10-CM

## 2016-12-21 DIAGNOSIS — E1151 Type 2 diabetes mellitus with diabetic peripheral angiopathy without gangrene: Secondary | ICD-10-CM | POA: Diagnosis present

## 2016-12-21 DIAGNOSIS — D6481 Anemia due to antineoplastic chemotherapy: Secondary | ICD-10-CM | POA: Diagnosis present

## 2016-12-21 DIAGNOSIS — C7971 Secondary malignant neoplasm of right adrenal gland: Secondary | ICD-10-CM | POA: Diagnosis present

## 2016-12-21 DIAGNOSIS — Z923 Personal history of irradiation: Secondary | ICD-10-CM

## 2016-12-21 DIAGNOSIS — C3490 Malignant neoplasm of unspecified part of unspecified bronchus or lung: Secondary | ICD-10-CM | POA: Diagnosis present

## 2016-12-21 DIAGNOSIS — E785 Hyperlipidemia, unspecified: Secondary | ICD-10-CM | POA: Diagnosis present

## 2016-12-21 DIAGNOSIS — C3432 Malignant neoplasm of lower lobe, left bronchus or lung: Secondary | ICD-10-CM | POA: Diagnosis present

## 2016-12-21 DIAGNOSIS — Z85118 Personal history of other malignant neoplasm of bronchus and lung: Secondary | ICD-10-CM

## 2016-12-21 DIAGNOSIS — Z8673 Personal history of transient ischemic attack (TIA), and cerebral infarction without residual deficits: Secondary | ICD-10-CM

## 2016-12-21 DIAGNOSIS — Z6834 Body mass index (BMI) 34.0-34.9, adult: Secondary | ICD-10-CM

## 2016-12-21 DIAGNOSIS — Z833 Family history of diabetes mellitus: Secondary | ICD-10-CM

## 2016-12-21 DIAGNOSIS — M1A9XX0 Chronic gout, unspecified, without tophus (tophi): Secondary | ICD-10-CM | POA: Diagnosis present

## 2016-12-21 LAB — CBC WITH DIFFERENTIAL/PLATELET
Basophils Absolute: 0 10*3/uL (ref 0.0–0.1)
Basophils Relative: 1 %
EOS PCT: 2 %
Eosinophils Absolute: 0.1 10*3/uL (ref 0.0–0.7)
HCT: 38.5 % (ref 36.0–46.0)
Hemoglobin: 12.2 g/dL (ref 12.0–15.0)
LYMPHS ABS: 1.7 10*3/uL (ref 0.7–4.0)
LYMPHS PCT: 25 %
MCH: 31.1 pg (ref 26.0–34.0)
MCHC: 31.7 g/dL (ref 30.0–36.0)
MCV: 98.2 fL (ref 78.0–100.0)
Monocytes Absolute: 0.7 10*3/uL (ref 0.1–1.0)
Monocytes Relative: 11 %
Neutro Abs: 4.1 10*3/uL (ref 1.7–7.7)
Neutrophils Relative %: 61 %
PLATELETS: 269 10*3/uL (ref 150–400)
RBC: 3.92 MIL/uL (ref 3.87–5.11)
RDW: 15.7 % — ABNORMAL HIGH (ref 11.5–15.5)
WBC: 6.7 10*3/uL (ref 4.0–10.5)

## 2016-12-21 LAB — I-STAT CHEM 8, ED
BUN: 24 mg/dL — AB (ref 6–20)
CALCIUM ION: 1.15 mmol/L (ref 1.15–1.40)
CREATININE: 1.1 mg/dL — AB (ref 0.44–1.00)
Chloride: 106 mmol/L (ref 101–111)
GLUCOSE: 81 mg/dL (ref 65–99)
HCT: 41 % (ref 36.0–46.0)
Hemoglobin: 13.9 g/dL (ref 12.0–15.0)
Potassium: 4.7 mmol/L (ref 3.5–5.1)
Sodium: 141 mmol/L (ref 135–145)
TCO2: 25 mmol/L (ref 0–100)

## 2016-12-21 LAB — COMPREHENSIVE METABOLIC PANEL
ALK PHOS: 149 U/L — AB (ref 38–126)
ALT: 10 U/L — AB (ref 14–54)
AST: 14 U/L — ABNORMAL LOW (ref 15–41)
Albumin: 3 g/dL — ABNORMAL LOW (ref 3.5–5.0)
Anion gap: 8 (ref 5–15)
BILIRUBIN TOTAL: 0.5 mg/dL (ref 0.3–1.2)
BUN: 24 mg/dL — ABNORMAL HIGH (ref 6–20)
CALCIUM: 8.7 mg/dL — AB (ref 8.9–10.3)
CO2: 25 mmol/L (ref 22–32)
CREATININE: 1.13 mg/dL — AB (ref 0.44–1.00)
Chloride: 107 mmol/L (ref 101–111)
GFR, EST AFRICAN AMERICAN: 50 mL/min — AB (ref >60)
GFR, EST NON AFRICAN AMERICAN: 43 mL/min — AB (ref >60)
Glucose, Bld: 84 mg/dL (ref 65–99)
Potassium: 4.7 mmol/L (ref 3.5–5.1)
Sodium: 140 mmol/L (ref 135–145)
TOTAL PROTEIN: 7.3 g/dL (ref 6.5–8.1)

## 2016-12-21 LAB — GLUCOSE, CAPILLARY
GLUCOSE-CAPILLARY: 173 mg/dL — AB (ref 65–99)
Glucose-Capillary: 166 mg/dL — ABNORMAL HIGH (ref 65–99)

## 2016-12-21 LAB — URINALYSIS, ROUTINE W REFLEX MICROSCOPIC
Bilirubin Urine: NEGATIVE
GLUCOSE, UA: NEGATIVE mg/dL
HGB URINE DIPSTICK: NEGATIVE
Ketones, ur: NEGATIVE mg/dL
Leukocytes, UA: NEGATIVE
Nitrite: NEGATIVE
Protein, ur: NEGATIVE mg/dL
SPECIFIC GRAVITY, URINE: 1.019 (ref 1.005–1.030)
pH: 5 (ref 5.0–8.0)

## 2016-12-21 LAB — I-STAT TROPONIN, ED: Troponin i, poc: 0.01 ng/mL (ref 0.00–0.08)

## 2016-12-21 MED ORDER — ALBUTEROL SULFATE HFA 108 (90 BASE) MCG/ACT IN AERS: 1.0000 | INHALATION_SPRAY | RESPIRATORY_TRACT | 0 refills | Status: DC | PRN

## 2016-12-21 MED ORDER — IPRATROPIUM-ALBUTEROL 0.5-2.5 (3) MG/3ML IN SOLN
3.0000 mL | Freq: Four times a day (QID) | RESPIRATORY_TRACT | Status: DC
  Administered 2016-12-21 – 2016-12-25 (×15): 3 mL via RESPIRATORY_TRACT
  Filled 2016-12-21 (×15): qty 3

## 2016-12-21 MED ORDER — DOCUSATE SODIUM 100 MG PO CAPS: 100.0000 mg | ORAL_CAPSULE | Freq: Two times a day (BID) | ORAL | 0 refills | Status: DC

## 2016-12-21 MED ORDER — ACETAMINOPHEN 500 MG PO TABS
500.0000 mg | ORAL_TABLET | Freq: Four times a day (QID) | ORAL | Status: DC | PRN
  Administered 2016-12-21 – 2016-12-22 (×3): 500 mg via ORAL
  Filled 2016-12-21 (×3): qty 1

## 2016-12-21 MED ORDER — METHYLPREDNISOLONE SODIUM SUCC 125 MG IJ SOLR
80.0000 mg | Freq: Two times a day (BID) | INTRAMUSCULAR | Status: DC
  Administered 2016-12-22 – 2016-12-23 (×3): 80 mg via INTRAVENOUS
  Filled 2016-12-21 (×3): qty 2

## 2016-12-21 MED ORDER — LINAGLIPTIN 5 MG PO TABS
5.0000 mg | ORAL_TABLET | Freq: Every day | ORAL | Status: DC
  Administered 2016-12-22 – 2016-12-25 (×4): 5 mg via ORAL
  Filled 2016-12-21 (×5): qty 1

## 2016-12-21 MED ORDER — IPRATROPIUM-ALBUTEROL 0.5-2.5 (3) MG/3ML IN SOLN
3.0000 mL | Freq: Once | RESPIRATORY_TRACT | Status: AC
  Administered 2016-12-21: 3 mL via RESPIRATORY_TRACT
  Filled 2016-12-21: qty 3

## 2016-12-21 MED ORDER — ASPIRIN EC 81 MG PO TBEC
81.0000 mg | DELAYED_RELEASE_TABLET | Freq: Every day | ORAL | Status: DC
  Administered 2016-12-21 – 2016-12-25 (×5): 81 mg via ORAL
  Filled 2016-12-21 (×5): qty 1

## 2016-12-21 MED ORDER — PANTOPRAZOLE SODIUM 40 MG PO TBEC
40.0000 mg | DELAYED_RELEASE_TABLET | Freq: Every day | ORAL | Status: DC
  Administered 2016-12-21 – 2016-12-25 (×5): 40 mg via ORAL
  Filled 2016-12-21 (×5): qty 1

## 2016-12-21 MED ORDER — METHYLPREDNISOLONE SODIUM SUCC 125 MG IJ SOLR
125.0000 mg | Freq: Once | INTRAMUSCULAR | Status: AC
  Administered 2016-12-21: 125 mg via INTRAVENOUS
  Filled 2016-12-21: qty 2

## 2016-12-21 MED ORDER — HYDROCHLOROTHIAZIDE 25 MG PO TABS
25.0000 mg | ORAL_TABLET | Freq: Every day | ORAL | Status: DC
  Administered 2016-12-21 – 2016-12-22 (×2): 25 mg via ORAL
  Filled 2016-12-21 (×2): qty 1

## 2016-12-21 MED ORDER — PREDNISONE 20 MG PO TABS: 60.0000 mg | ORAL_TABLET | Freq: Every day | ORAL | 0 refills | Status: DC

## 2016-12-21 MED ORDER — ALBUTEROL SULFATE (2.5 MG/3ML) 0.083% IN NEBU
2.5000 mg | INHALATION_SOLUTION | RESPIRATORY_TRACT | Status: DC | PRN
  Administered 2016-12-22 – 2016-12-23 (×2): 2.5 mg via RESPIRATORY_TRACT
  Filled 2016-12-21 (×2): qty 3

## 2016-12-21 MED ORDER — FENTANYL CITRATE (PF) 100 MCG/2ML IJ SOLN
50.0000 ug | Freq: Once | INTRAMUSCULAR | Status: AC
  Administered 2016-12-21: 50 ug via INTRAVENOUS
  Filled 2016-12-21: qty 2

## 2016-12-21 MED ORDER — ALLOPURINOL 100 MG PO TABS
100.0000 mg | ORAL_TABLET | Freq: Every day | ORAL | Status: DC
  Administered 2016-12-21 – 2016-12-25 (×5): 100 mg via ORAL
  Filled 2016-12-21 (×5): qty 1

## 2016-12-21 MED ORDER — GABAPENTIN 300 MG PO CAPS
300.0000 mg | ORAL_CAPSULE | Freq: Three times a day (TID) | ORAL | Status: DC
  Administered 2016-12-21 – 2016-12-25 (×11): 300 mg via ORAL
  Filled 2016-12-21 (×10): qty 1

## 2016-12-21 MED ORDER — IRBESARTAN 150 MG PO TABS
150.0000 mg | ORAL_TABLET | Freq: Every day | ORAL | Status: DC
  Administered 2016-12-21 – 2016-12-23 (×3): 150 mg via ORAL
  Filled 2016-12-21 (×3): qty 1

## 2016-12-21 MED ORDER — IOPAMIDOL (ISOVUE-370) INJECTION 76%
100.0000 mL | Freq: Once | INTRAVENOUS | Status: AC | PRN
  Administered 2016-12-21: 100 mL via INTRAVENOUS

## 2016-12-21 MED ORDER — INSULIN ASPART 100 UNIT/ML ~~LOC~~ SOLN: 0.0000 [IU] | Freq: Every day | SUBCUTANEOUS | Status: DC

## 2016-12-21 MED ORDER — GUAIFENESIN-DM 100-10 MG/5ML PO SYRP
5.0000 mL | ORAL_SOLUTION | ORAL | Status: DC | PRN
  Administered 2016-12-21 – 2016-12-24 (×7): 5 mL via ORAL
  Filled 2016-12-21 (×7): qty 10

## 2016-12-21 MED ORDER — SODIUM CHLORIDE 0.45 % IV SOLN
INTRAVENOUS | Status: AC
  Administered 2016-12-21: 14:00:00 via INTRAVENOUS

## 2016-12-21 MED ORDER — INSULIN ASPART 100 UNIT/ML ~~LOC~~ SOLN
0.0000 [IU] | Freq: Three times a day (TID) | SUBCUTANEOUS | Status: DC
  Administered 2016-12-21: 3 [IU] via SUBCUTANEOUS
  Administered 2016-12-22: 2 [IU] via SUBCUTANEOUS
  Administered 2016-12-22: 3 [IU] via SUBCUTANEOUS
  Administered 2016-12-23 – 2016-12-25 (×4): 2 [IU] via SUBCUTANEOUS

## 2016-12-21 MED ORDER — ONDANSETRON HCL 4 MG/2ML IJ SOLN
4.0000 mg | Freq: Once | INTRAMUSCULAR | Status: AC
  Administered 2016-12-21: 4 mg via INTRAVENOUS
  Filled 2016-12-21: qty 2

## 2016-12-21 MED ORDER — HYDROCODONE-ACETAMINOPHEN 5-325 MG PO TABS: 1.0000 | ORAL_TABLET | Freq: Four times a day (QID) | ORAL | 0 refills | Status: DC | PRN

## 2016-12-21 MED ORDER — IOPAMIDOL (ISOVUE-370) INJECTION 76%
INTRAVENOUS | Status: AC
  Filled 2016-12-21: qty 100

## 2016-12-21 MED ORDER — METHYLPREDNISOLONE SODIUM SUCC 125 MG IJ SOLR
80.0000 mg | Freq: Four times a day (QID) | INTRAMUSCULAR | Status: DC
  Administered 2016-12-21: 80 mg via INTRAVENOUS
  Filled 2016-12-21: qty 2

## 2016-12-21 MED ORDER — ATORVASTATIN CALCIUM 10 MG PO TABS
20.0000 mg | ORAL_TABLET | Freq: Every day | ORAL | Status: DC
  Administered 2016-12-21 – 2016-12-25 (×5): 20 mg via ORAL
  Filled 2016-12-21 (×5): qty 2

## 2016-12-21 MED ORDER — ONDANSETRON HCL 4 MG/2ML IJ SOLN: 4.0000 mg | Freq: Four times a day (QID) | INTRAMUSCULAR | Status: DC | PRN

## 2016-12-21 MED ORDER — ONDANSETRON HCL 4 MG PO TABS: 4.0000 mg | ORAL_TABLET | Freq: Four times a day (QID) | ORAL | Status: DC | PRN

## 2016-12-21 MED ORDER — ENOXAPARIN SODIUM 40 MG/0.4ML ~~LOC~~ SOLN
40.0000 mg | SUBCUTANEOUS | Status: DC
  Administered 2016-12-21 – 2016-12-23 (×3): 40 mg via SUBCUTANEOUS
  Filled 2016-12-21 (×4): qty 0.4

## 2016-12-21 NOTE — Progress Notes (Signed)
Patient c/o pain still not relieved. Patient yells in pain when turned or placed on bed pan or when she coughs. States the pain is always present in her lungs and she takes Vicodin at home. Patient was given Tylenol and scheduled Neurontin over 1.5 hour ago with no relief. Patient requesting doctor to be called to request something stronger for pain. Will c/t monitor.

## 2016-12-21 NOTE — ED Provider Notes (Addendum)
TIME SEEN: 5:33 AM  CHIEF COMPLAINT: Left lateral chest pain  HPI: Pt is a 83 y.o. female with history of lung cancer who is currently not undergoing chemotherapy or radiation( but has had both previously), AAA, diabetes, hypertension, hyperlipidemia who presents emergency department with complaints of left lateral chest pain. Pain is described as sharp, achy and worse with movement and palpation. She does feel short of breath but this is chronic. No history of injury that she is aware of. She states she has had a cough. She is also been wheezing. She denies to me any fever. No abdominal pain. No dysuria or hematuria. No history of kidney stone. She has had previous history of iatrogenic pneumothorax.   Family - Lucas Mallow [phone]  ROS: See HPI Constitutional: no fever  Eyes: no drainage  ENT: no runny nose   Cardiovascular:  Left lateral chest pain  Resp: no SOB  GI: no vomiting or diarrhea, no abdominal pain GU: no dysuria Integumentary: no rash  Allergy: no hives  Musculoskeletal: no leg swelling  Neurological: no slurred speech ROS otherwise negative  PAST MEDICAL HISTORY/PAST SURGICAL HISTORY:  Past Medical History:  Diagnosis Date  . AAA (abdominal aortic aneurysm) ([place])   . Arthritis   . Cerebrovascular accident [facility]) history of mild cerebrovascular accident which caused some visual disturbance.   . Chronic fatigue 12/15/2016  . Cough with hemoptysis 01/22/12   Prior to discharge  . Diabetes mellitus   . GERD (gastroesophageal reflux disease)   . History of radiation therapy 02/18/12,02/23/12,02/25/12., 03/01/12,&03/03/12   RULlung 50Gy/5/fx  . Hyperlipidemia   . Hypertension   . Lung cancer ([place]) 01/21/12  . Lung mass    Right upper Lobe  . Morbid obesity ([place])   . Peripheral vascular disease (HCC)    hx blood clot 30 yrs ago leg  . Pneumonia    hx  . Pneumothorax, iatrogenic 8/2/113-discharge note   Post Biospy  . Shortness of breath   . Tachycardia 01/22/12   Prior  to discharge    MEDICATIONS:  Prior to Admission medications   Medication Sig Start Date End Date Taking? Authorizing Provider  acetaminophen (TYLENOL) 500 MG tablet Take 500 mg by mouth every 6 (six) hours as needed for moderate pain (inflammation).   Yes [provider]  allopurinol (ZYLOPRIM) 100 MG tablet Take 100 mg by mouth daily.    Yes [provider]  aspirin EC 81 MG tablet Take 81 mg by mouth daily.   Yes [provider]  atorvastatin (LIPITOR) 20 MG tablet Take 20 mg by mouth daily.     Yes [provider]  cholecalciferol (VITAMIN D) 1000 UNITS tablet Take 1,000 Units by mouth daily.   Yes [provider]  eprosartan (TEVETEN) 600 MG tablet Take 600 mg by mouth daily. As directed 04/15/15  Yes [provider]  gabapentin (NEURONTIN) 300 MG capsule Take 300 mg by mouth 3 (three) times daily. As directed 05/23/15  Yes [provider]  hydrochlorothiazide (HYDRODIURIL) 25 MG tablet Take 25 mg by mouth daily. As directed 04/15/15  Yes [provider]  HYDROcodone-acetaminophen (NORCO/VICODIN) 5-325 MG tablet Take 1 tablet by mouth daily as needed. 06/19/15  Yes Curt Bears, MD  JANUVIA 100 MG tablet Take 100 mg by mouth daily.  03/11/11  Yes [provider]  pantoprazole (PROTONIX) 40 MG tablet Take 40 mg by mouth daily.    Yes [provider]  metoprolol tartrate (LOPRESSOR) 25 MG tablet Take 0.5  tablets (12.5 mg total) by mouth 2 (two) times daily. Patient not taking: Reported on 12/21/2016 02/14/15   Annita Brod, MD    ALLERGIES:  Allergies  Allergen Reactions  . Asa Buff (Mag [Buffered Aspirin]     In tolerance in higher dosages.    SOCIAL HISTORY:  Social History  Substance Use Topics  . Smoking status: Former Smoker    Packs/day: 0.50    Years: 65.00    Types: Cigarettes    Quit date: 02/20/2016  . Smokeless tobacco: Never Used  . Alcohol use No     Comment: She does  not consume alcohol on a regular basis.    FAMILY HISTORY: Family History  Problem Relation Age of Onset  . Heart disease Mother   . Hypertension Mother   . Diabetes Mother   . Hyperlipidemia Mother   . Heart disease Father   . Hypertension Father   . Diabetes Father   . Hyperlipidemia Father   . Diabetes Daughter     EXAM: BP (!) 149/68 (BP Location: Right Arm)   Pulse 71   Temp 98.5 F (36.9 C)   Resp 20   SpO2 96%  CONSTITUTIONAL: Alert and oriented and responds appropriately to questions. Well-appearing; well-nourished, Elderly, smiling, laughing, afebrile HEAD: Normocephalic EYES: Conjunctivae clear, pupils appear equal, EOMI ENT: normal nose; moist mucous membranes NECK: Supple, no meningismus, no nuchal rigidity, no LAD  CARD: RRR; S1 and S2 appreciated; no murmurs, no clicks, no rubs, no gallops CHEST:  Chest wall tender to palpation over the left lateral ribs without redness, ecchymosis or deformity. No lesions noted to the chest wall. RESP: Normal chest excursion without splinting or tachypnea; breath sounds equal bilaterally; diffuse scattered extra wheezes, slightly diminished at bases bilaterally, no rhonchi, no rales, no hypoxia or respiratory distress, speaking full sentences ABD/GI: Normal bowel sounds; non-distended; soft, non-tender, no rebound, no guarding, no peritoneal signs, no hepatosplenomegaly BACK:  The back appears normal and is non-tender to palpation, there is no CVA tenderness, no midline spinal tenderness or step-off or deformity EXT: Normal ROM in all joints; non-tender to palpation; no edema; normal capillary refill; no cyanosis, no calf tenderness or swelling    SKIN: Normal color for age and race; warm; no rash NEURO: Moves all extremities equally, reports normal sensation diffusely PSYCH: The patient's mood and manner are appropriate. Grooming and personal hygiene are appropriate.  MEDICAL DECISION MAKING: Patient here with left lateral chest  pain. This seems to be musculoskeletal but given her history of cancer she is at risk for pulmonary embolus, metastasis, pathologic fracture. Will obtain a CT of her chest for further evaluation. We'll give pain medication. She is wheezing here. Will give duo nebs. We'll also obtain labs including troponin all them a suspicion for ACS is low. We'll also obtain EKG.  ED PROGRESS: 6:50 AM  Pt's aeration has improved but she is still wheezing. Labs are unremarkable including negative troponin. Urine, CT scan pending. We'll give 2 more DuoNeb treatment as well as Solu-Medrol. She is not hypoxic. At this time she reports her pain is completely gone after one dose of IV fentanyl.   8:00 AM  Pt's urine shows no sign of infection. CT of her chest pending. Patient will need to be reassessed after CT completed to see if wheezing has resolved.  If work up is negative and wheezing has improved, plan will be to discharge patient home with albuterol, prednisone, and a short course of Vicodin she states  she has recently run out of this medication.  Signed out to Dr. Tammy Sours to reassess patient and follow up on CTA chest.  I reviewed all nursing notes, vitals, pertinent previous records, EKGs, lab and urine results, imaging (as available).   EKG Interpretation  Date/Time:  Monday December 21 2016 06:27:05 EDT Ventricular Rate:  80 PR Interval:    QRS Duration: 82 QT Interval:  383 QTC Calculation: 439 R Axis:   -7 Text Interpretation:  Sinus rhythm Atrial premature complexes Prolonged PR interval Consider right atrial enlargement Low voltage, precordial leads Consider anterior infarct Borderline ST depression, lateral leads Artifact Confirmed by Pryor Curia [phone]) on 12/21/2016 6:29:08 AM         Reyna Lorenzi, Delice Bison, DO 12/21/16 0758    Yoan Sallade, Delice Bison, DO 12/21/16 6045

## 2016-12-21 NOTE — ED Notes (Signed)
Bed: YJ49 Expected date:  Expected time:  Means of arrival:  Comments: 83 yo F/Flank pain

## 2016-12-21 NOTE — H&P (Signed)
History and Physical    Beverly Lewis WCH:[id number] DOB: 1929-03-03 DOA: 12/21/2016  PCP: Nolene Ebbs, MD   Patient coming from: Home.  I have personally briefly reviewed patient's old medical records in [place]  Chief Complaint: Left flank pain  HPI: Beverly Lewis is a 83 y.o. female with medical history significant of AAA, osteoarthritis, CVA, chronic fatigue, type 2 diabetes, GERD, hyperlipidemia, hypertension, history of lung cancer that is post chemotherapy/radiotherapy who is coming to the emergency department with complaints of left flank/left lower pleuritic chest wall pain for about a week and productive cough.  Per patient, she has had this pain for the past week. She has dyspnea, fatigue, chills and an occasionally productive cough which makes the pain worse. She denies fever, precordial chest pain, palpitations, diaphoresis, dizziness, abdominal pain, nausea, emesis, diarrhea, constipation, melena or hematochezia. She denies dysuria, frequency or hematuria. She denies pruritus or skin rashes.  ED Course: His initial vital signs were temperature 36.68F, pulse 71, blood pressure 149/68 mmHg, respirations 20 and O2 sat 96%. The patient received supplemental oxygen, Solu-Medrol 125 mg IVP times but notes and multiple DuoNeb nebulizer treatments. Her CBC was normal. BMP shows a BUN of 24 and a creatinine of 1.13 mg/dL, but is otherwise normal. Troponin level was negative, EKG is sinus rhythm with APCs and possible previous anterior infarct.  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 Please see images sent for radiology report for further details  Review of Systems: As per HPI otherwise 10 point review of systems negative.    Past  Medical History:  Diagnosis Date  . AAA (abdominal aortic aneurysm) ([place])   . Arthritis   . Cerebrovascular accident [facility]) history of mild cerebrovascular accident which caused some visual disturbance.   . Chronic fatigue 12/15/2016  . Cough with hemoptysis 01/22/12   Prior to discharge  . Diabetes mellitus   . GERD (gastroesophageal reflux disease)   . History of radiation therapy 02/18/12,02/23/12,02/25/12., 03/01/12,&03/03/12   RULlung 50Gy/5/fx  . Hyperlipidemia   . Hypertension   . Lung cancer ([place]) 01/21/12  . Lung mass    Right upper Lobe  . Morbid obesity ([place])   . Peripheral vascular disease (HCC)    hx blood clot 30 yrs ago leg  . Pneumonia    hx  . Pneumothorax, iatrogenic 8/2/113-discharge note   Post Biospy  . Shortness of breath   . Tachycardia 01/22/12   Prior to discharge    Past Surgical History:  Procedure Laterality Date  . ABDOMINAL AORTIC ANEURYSM REPAIR  06/28/10   Stent Graft repair    . Aortogram   03/03/11   with stenting , left external iliac artery  . APPENDECTOMY    . EXPLORATORY LAPAROTOMY     For ectopic pregnancy  . LUMBAR DISC SURGERY    . Navigational Bronchoscopy     Right Upper Lobe Mass with Biopsies, Brushings, Washings, and bronchoalveolar Lavage:  . Paratracheal Adenopathy and Bilateral Adrenal Nodules       reports that she quit smoking about 10 months ago. Her smoking use included Cigarettes. She has a 32.50 pack-year smoking history. She has never used smokeless tobacco. She reports that she does not drink alcohol or use drugs.  Allergies  Allergen Reactions  . Fleet Contras [id number] [  Buffered Aspirin]     In tolerance in higher dosages.    Family History  Problem Relation Age of Onset  . Heart disease Mother   . Hypertension Mother   . Diabetes Mother   . Hyperlipidemia Mother   . Heart disease Father   . Hypertension Father   . Diabetes Father   . Hyperlipidemia Father   . Diabetes Daughter     Prior to Admission medications     Medication Sig Start Date End Date Taking? Authorizing Provider  acetaminophen (TYLENOL) 500 MG tablet Take 500 mg by mouth every 6 (six) hours as needed for moderate pain (inflammation).   Yes [provider]  allopurinol (ZYLOPRIM) 100 MG tablet Take 100 mg by mouth daily.    Yes [provider]  aspirin EC 81 MG tablet Take 81 mg by mouth daily.   Yes [provider]  atorvastatin (LIPITOR) 20 MG tablet Take 20 mg by mouth daily.     Yes [provider]  cholecalciferol (VITAMIN D) 1000 UNITS tablet Take 1,000 Units by mouth daily.   Yes [provider]  eprosartan (TEVETEN) 600 MG tablet Take 600 mg by mouth daily. As directed 04/15/15  Yes [provider]  gabapentin (NEURONTIN) 300 MG capsule Take 300 mg by mouth 3 (three) times daily. As directed 05/23/15  Yes [provider]  hydrochlorothiazide (HYDRODIURIL) 25 MG tablet Take 25 mg by mouth daily. As directed 04/15/15  Yes [provider]  JANUVIA 100 MG tablet Take 100 mg by mouth daily.  03/11/11  Yes [provider]  pantoprazole (PROTONIX) 40 MG tablet Take 40 mg by mouth daily.    Yes [provider]  albuterol (PROVENTIL HFA;VENTOLIN HFA) 108 (90 Base) MCG/ACT inhaler Inhale 1-2 puffs into the lungs every 4 (four) hours as needed for wheezing or shortness of breath. 12/21/16   Ward, Delice Bison, DO  docusate sodium (COLACE) 100 MG capsule Take 1 capsule (100 mg total) by mouth every 12 (twelve) hours. 12/21/16   Ward, Delice Bison, DO  HYDROcodone-acetaminophen (NORCO/VICODIN) 5-325 MG tablet Take 1 tablet by mouth every 6 (six) hours as needed. 12/21/16   Ward, Delice Bison, DO  predniSONE (DELTASONE) 20 MG tablet Take 3 tablets (60 mg total) by mouth daily. 12/21/16   Ward, Delice Bison, DO    Physical Exam: Vitals:   12/21/16 0444 12/21/16 0834 12/21/16 0938 12/21/16 0940  BP: (!) 149/68 (!) 128/53 124/68   Pulse: 71 94 89   Resp: 20 16 (!) 25   Temp: 98.5  F (36.9 C)  98.5 F (36.9 C)   TempSrc:   Oral   SpO2: 96% 90% (!) 74% 92%    Constitutional: NAD, calm, comfortable Eyes: PERRL, lids and conjunctivae normal ENMT: Mucous membranes are moist. Posterior pharynx clear of any exudate or lesions. Neck: normal, supple, no masses, no thyromegaly Respiratory: Decreased breath sounds on bases and bilateral wheezing, no crackles. Normal respiratory effort. No accessory muscle use.  Cardiovascular: Regular rate and rhythm, no murmurs / rubs / gallops. No extremity edema. 2+ pedal pulses. No carotid bruits.  Abdomen: no tenderness, no masses palpated. No hepatosplenomegaly. Bowel sounds positive.  Musculoskeletal: Mild clubbing. No cyanosis. Good ROM, no contractures. Normal muscle tone.  Skin: no significant rashes, lesions, ulcers on limited skin exam Neurologic: CN 2-12 grossly intact. Sensation intact, DTR normal. Strength 5/5 in all 4.  Psychiatric: Normal judgment and insight. Alert and oriented x 3. Normal mood.  Labs on Admission: I have personally reviewed following labs and imaging studies  CBC:  Recent Labs Lab 12/15/16 1040 12/21/16 0558 12/21/16 0610  WBC 5.7 6.7  --   NEUTROABS 3.2 4.1  --   HGB 12.7 12.2 13.9  HCT 39.8 38.5 41.0  MCV 97.7 98.2  --   PLT 255 269  --    Basic Metabolic Panel:  Recent Labs Lab 12/15/16 1040 12/21/16 0558 12/21/16 0610  NA 139 140 141  K 4.9 4.7 4.7  CL  --  107 106  CO2 25 25  --   GLUCOSE 84 84 81  BUN 30.7* 24* 24*  CREATININE 1.2* 1.13* 1.10*  CALCIUM 9.3 8.7*  --    GFR: Estimated Creatinine Clearance: 41.1 mL/min (A) (by C-G formula based on SCr of 1.1 mg/dL (H)). Liver Function Tests:  Recent Labs Lab 12/15/16 1040 12/21/16 0558  AST 13 14*  ALT 9 10*  ALKPHOS 181* 149*  BILITOT 0.35 0.5  PROT 7.4 7.3  ALBUMIN 2.9* 3.0*   No results for input(s): LIPASE, AMYLASE in the last 168 hours. No results for input(s): AMMONIA in the last 168 hours. Coagulation  Profile: No results for input(s): INR, PROTIME in the last 168 hours. Cardiac Enzymes: No results for input(s): CKTOTAL, CKMB, CKMBINDEX, TROPONINI in the last 168 hours. BNP (last 3 results) No results for input(s): PROBNP in the last 8760 hours. HbA1C: No results for input(s): HGBA1C in the last 72 hours. CBG: No results for input(s): GLUCAP in the last 168 hours. Lipid Profile: No results for input(s): CHOL, HDL, LDLCALC, TRIG, CHOLHDL, LDLDIRECT in the last 72 hours. Thyroid Function Tests: No results for input(s): TSH, T4TOTAL, FREET4, T3FREE, THYROIDAB in the last 72 hours. Anemia Panel: No results for input(s): VITAMINB12, FOLATE, FERRITIN, TIBC, IRON, RETICCTPCT in the last 72 hours. Urine analysis:    Component Value Date/Time   COLORURINE YELLOW 12/21/2016 0531   APPEARANCEUR HAZY (A) 12/21/2016 0531   LABSPEC 1.019 12/21/2016 0531   PHURINE 5.0 12/21/2016 0531   GLUCOSEU NEGATIVE 12/21/2016 0531   HGBUR NEGATIVE 12/21/2016 0531   BILIRUBINUR NEGATIVE 12/21/2016 0531   KETONESUR NEGATIVE 12/21/2016 0531   PROTEINUR NEGATIVE 12/21/2016 0531   UROBILINOGEN 0.2 09/28/2014 0900   NITRITE NEGATIVE 12/21/2016 0531   LEUKOCYTESUR NEGATIVE 12/21/2016 0531    Radiological Exams on Admission: Ct Angio Chest Pe W And/or Wo Contrast  Result Date: 12/21/2016 CLINICAL DATA:  Chest pain, left-sided chest wall pain and history of lung carcinoma. EXAM: CT ANGIOGRAPHY CHEST WITH CONTRAST TECHNIQUE: Multidetector CT imaging of the chest was performed using the standard protocol during bolus administration of intravenous contrast. Multiplanar CT image reconstructions and MIPs were obtained to evaluate the vascular anatomy. CONTRAST:  100 mL Isovue 370 IV COMPARISON:  CT of the chest without contrast on 10/06/2016 as well as multiple prior additional studies. FINDINGS: Cardiovascular: The pulmonary arteries are well opacified and demonstrate no evidence of pulmonary embolism. Stable heart  size and calcified coronary artery plaque in the distribution of the LAD and left circumflex coronary arteries. The thoracic aorta demonstrates stable atherosclerosis with irregular calcified and noncalcified plaque present, some of which appears ulcerated. Stable mild aneurysmal dilatation of the ascending thoracic aorta measures approximately 4.3 cm. Stable thrombosed penetrating ulcer creating 2.5 cm aneurysmal outpouching along the superior aspect of the proximal descending thoracic aorta along the posterior aspect of the aortic arch. No pericardial effusion identified. Mediastinum/Nodes: Interval enlargement of right lower paratracheal lymph node measuring 1.6  cm in short axis compared to approximately 1.3 cm previously. Lungs/Pleura: There is progression of malignancy since prior imaging. Disease in the right upper lobe appears fairly stable. Confluent masses in the posterior perihilar region of the right lung extending into the right lower lobe are more prominent with maximal conglomerate transverse diameter of tumor measuring approximately 4 x 7.5 cm. Spiculated nodule in the left upper lobe on image number 27 appears fairly stable and measures approximately 1.7 cm. Nodule in the superior segment of the left lower lobe on image number 30 measures 8 mm and is fairly stable. There is progression of tumor in the posterior and inferior left lower lobe with merging of separate areas of tumor seen previously into a more confluent area of tumor now measuring approximately 5 cm in greatest diameter. Irregular areas of lobulated tumor at the right lung base appear fairly stable and measure approximately 8.3 cm in greatest transverse diameter. Upper Abdomen: Progression of bilateral adrenal metastases with left adrenal metastasis now measuring 3.6 x 4.5 cm compared to approximately 3.3 cm in maximal diameter previously and right adrenal metastasis measuring 2.6 x 3.0 cm compared to 2 cm previously. Musculoskeletal: No  chest wall abnormality. No acute or significant osseous findings. Review of the MIP images confirms the above findings. IMPRESSION: 1. No evidence of acute pulmonary embolism. 2. Progression of lung carcinoma with enlarged right lower paratracheal lymph node and progression of left lower lobe tumor and bilateral adrenal metastases. Malignancy in the right lung and left upper lung appears relatively stable. 3. 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 Electronically Signed   By: Aletta Edouard M.D.   On: 12/21/2016 09:14    EKG: Independently reviewed.  Vent. rate 80 BPM PR interval * ms QRS duration 82 ms QT/QTc 383/439 ms P-R-T axes 16 -7 77 Sinus rhythm Atrial premature complexes Prolonged PR interval Consider right atrial enlargement Low voltage, precordial leads Consider anterior infarct Borderline ST depression, lateral leads Artifact  Assessment/Plan Principal Problem:   COPD exacerbation (HCC) Observation/telemetry. Continue supplemental oxygen. Continue DuoNeb every 6 hours. Albuterol 2.5 mg via neb every 2-4 hours as needed. Continue Solu-Medrol 80 mg every 6 hours and taper off as possible. Monitor CBG closely while on steroids.  Active Problems:   Non-small cell carcinoma of lung, stage 3 (HCC) Discussed briefly CT report with the patient. Advised to follow-up with oncology.    Essential hypertension, benign Continue Teveten 600 mg daily or generic equivalent. Continue hydrochlorothiazide 25 mg by mouth daily. Monitor blood pressure, renal function and electrolytes.    GERD without esophagitis Protonix 40 mg by mouth daily.    Chronic gout of multiple sites Continue allopurinol 100 mg by mouth daily. Analgesics as needed.    Dyslipidemia Continue atorvastatin 20 mg by mouth daily. Monitor LFTs. Lipid panel follow-up as an outpatient.    Type II diabetes  mellitus with peripheral circulatory disorder (HCC) Carbohydrate modified diet. Continue Januvia 100 mg by mouth daily. CBG monitoring with regular insulin sliding scale all in the hospital.    Abnormal EKG Check echocardiogram.     DVT prophylaxis: Lovenox SQ. Code Status: Full code. Family Communication:  Disposition Plan: Admit for COPD exacerbation. Consults called:  Admission status: Observation/telemetry.   Reubin Milan MD [facility] Pager [phone].  If 7PM-7AM, please contact night-coverage [email or web address] Password [facility]  12/21/2016, 11:31 AM

## 2016-12-21 NOTE — ED Provider Notes (Addendum)
  Physical Exam  BP 124/68 (BP Location: Right Arm)   Pulse 89   Temp 98.5 F (36.9 C) (Oral)   Resp (!) 25   SpO2 92%   Physical Exam  ED Course  Procedures  MDM  Assuming care of patient from Dr. Leonides Schanz   Patient in the ED for chest pain. Appears to be chest wall pain on the L side. She has a history of lung cancer who is currently not undergoing chemotherapy or radiation, AAA, diabetes, hypertension, hyperlipidemia  Workup thus far shows no acute changes.  Concerning findings are as following wheezing of the lungs. Important pending results are CT PE  According to Dr. Leonides Schanz, plan is to f/u o CT scan. If neg CT PE and wheezing improved, we can discharge her with nebs and pain meds.   Patient had no complains, no concerns from the nursing side. Will continue to monitor.    Varney Biles, MD 12/21/16 0803   11:19 AM  Pt has L flank pain. The CT showed L sided worsening mass:  There is progression of tumor in the posterior and inferior left lower lobe with merging of separate areas of tumor seen previously into a more confluent area of tumor now measuring approximately 5 cm in greatest diameter.  O2 sats in the 85% range on Room air when I saw her. Nebs ordered, reassessed, and o2 sats still low on room air. We will admit. We suspect both cancer and COPD contributing. More nebs ordered.   CRITICAL CARE Performed by: Varney Biles   Total critical care time: 31 minutes  Critical care time was exclusive of separately billable procedures and treating other patients.  Critical care was necessary to treat or prevent imminent or life-threatening deterioration.  Critical care was time spent personally by me on the following activities: development of treatment plan with patient and/or surrogate as well as nursing, discussions with consultants, evaluation of patient's response to treatment, examination of patient, obtaining history from patient or surrogate, ordering  and performing treatments and interventions, ordering and review of laboratory studies, ordering and review of radiographic studies, pulse oximetry and re-evaluation of patient's condition.    Varney Biles, MD 12/21/16 9379    Varney Biles, MD 12/21/16 1120

## 2016-12-21 NOTE — ED Notes (Signed)
Respiratory paged for breathing tx. 

## 2016-12-21 NOTE — ED Notes (Signed)
Pt. Declined to get into gown.RN,Ajsa made aware.

## 2016-12-21 NOTE — ED Triage Notes (Signed)
Per EMS pt coming from home with c/o L flank pain x 1 week, worse with movement, and productive cough. Per EMS pt has a hx of lung cancer

## 2016-12-22 ENCOUNTER — Encounter: Payer: Self-pay | Admitting: Radiation Oncology

## 2016-12-22 ENCOUNTER — Ambulatory Visit
Admit: 2016-12-22 | Discharge: 2016-12-22 | Disposition: A | Discharge status: Home / Self Care | Payer: Medicare Other | Admitting: Radiation Oncology

## 2016-12-22 DIAGNOSIS — Z833 Family history of diabetes mellitus: Secondary | ICD-10-CM | POA: Diagnosis not present

## 2016-12-22 DIAGNOSIS — Z87891 Personal history of nicotine dependence: Secondary | ICD-10-CM | POA: Diagnosis not present

## 2016-12-22 DIAGNOSIS — E1151 Type 2 diabetes mellitus with diabetic peripheral angiopathy without gangrene: Secondary | ICD-10-CM | POA: Diagnosis present

## 2016-12-22 DIAGNOSIS — R627 Adult failure to thrive: Secondary | ICD-10-CM | POA: Diagnosis present

## 2016-12-22 DIAGNOSIS — Z6834 Body mass index (BMI) 34.0-34.9, adult: Secondary | ICD-10-CM | POA: Diagnosis not present

## 2016-12-22 DIAGNOSIS — C7971 Secondary malignant neoplasm of right adrenal gland: Secondary | ICD-10-CM | POA: Diagnosis present

## 2016-12-22 DIAGNOSIS — R5382 Chronic fatigue, unspecified: Secondary | ICD-10-CM | POA: Diagnosis present

## 2016-12-22 DIAGNOSIS — Z923 Personal history of irradiation: Secondary | ICD-10-CM | POA: Diagnosis not present

## 2016-12-22 DIAGNOSIS — D6481 Anemia due to antineoplastic chemotherapy: Secondary | ICD-10-CM | POA: Diagnosis not present

## 2016-12-22 DIAGNOSIS — Z515 Encounter for palliative care: Secondary | ICD-10-CM

## 2016-12-22 DIAGNOSIS — R0789 Other chest pain: Secondary | ICD-10-CM | POA: Diagnosis not present

## 2016-12-22 DIAGNOSIS — Z8673 Personal history of transient ischemic attack (TIA), and cerebral infarction without residual deficits: Secondary | ICD-10-CM | POA: Diagnosis not present

## 2016-12-22 DIAGNOSIS — Z85118 Personal history of other malignant neoplasm of bronchus and lung: Secondary | ICD-10-CM | POA: Diagnosis not present

## 2016-12-22 DIAGNOSIS — E785 Hyperlipidemia, unspecified: Secondary | ICD-10-CM | POA: Diagnosis present

## 2016-12-22 DIAGNOSIS — T402X5A Adverse effect of other opioids, initial encounter: Secondary | ICD-10-CM | POA: Diagnosis not present

## 2016-12-22 DIAGNOSIS — Z7982 Long term (current) use of aspirin: Secondary | ICD-10-CM | POA: Diagnosis not present

## 2016-12-22 DIAGNOSIS — C3491 Malignant neoplasm of unspecified part of right bronchus or lung: Secondary | ICD-10-CM | POA: Diagnosis not present

## 2016-12-22 DIAGNOSIS — Z7189 Other specified counseling: Secondary | ICD-10-CM | POA: Diagnosis not present

## 2016-12-22 DIAGNOSIS — K219 Gastro-esophageal reflux disease without esophagitis: Secondary | ICD-10-CM | POA: Diagnosis present

## 2016-12-22 DIAGNOSIS — K5903 Drug induced constipation: Secondary | ICD-10-CM | POA: Diagnosis not present

## 2016-12-22 DIAGNOSIS — I712 Thoracic aortic aneurysm, without rupture: Secondary | ICD-10-CM | POA: Diagnosis present

## 2016-12-22 DIAGNOSIS — Z8349 Family history of other endocrine, nutritional and metabolic diseases: Secondary | ICD-10-CM | POA: Diagnosis not present

## 2016-12-22 DIAGNOSIS — C7972 Secondary malignant neoplasm of left adrenal gland: Secondary | ICD-10-CM | POA: Diagnosis present

## 2016-12-22 DIAGNOSIS — J9621 Acute and chronic respiratory failure with hypoxia: Secondary | ICD-10-CM | POA: Diagnosis present

## 2016-12-22 DIAGNOSIS — M1A49X0 Other secondary chronic gout, multiple sites, without tophus (tophi): Secondary | ICD-10-CM | POA: Diagnosis not present

## 2016-12-22 DIAGNOSIS — Z8249 Family history of ischemic heart disease and other diseases of the circulatory system: Secondary | ICD-10-CM | POA: Diagnosis not present

## 2016-12-22 DIAGNOSIS — G893 Neoplasm related pain (acute) (chronic): Secondary | ICD-10-CM | POA: Diagnosis not present

## 2016-12-22 DIAGNOSIS — R109 Unspecified abdominal pain: Secondary | ICD-10-CM | POA: Diagnosis present

## 2016-12-22 DIAGNOSIS — M1A9XX0 Chronic gout, unspecified, without tophus (tophi): Secondary | ICD-10-CM | POA: Diagnosis present

## 2016-12-22 DIAGNOSIS — C3432 Malignant neoplasm of lower lobe, left bronchus or lung: Secondary | ICD-10-CM | POA: Diagnosis present

## 2016-12-22 DIAGNOSIS — E875 Hyperkalemia: Secondary | ICD-10-CM | POA: Diagnosis present

## 2016-12-22 DIAGNOSIS — T451X5A Adverse effect of antineoplastic and immunosuppressive drugs, initial encounter: Secondary | ICD-10-CM | POA: Diagnosis not present

## 2016-12-22 DIAGNOSIS — I10 Essential (primary) hypertension: Secondary | ICD-10-CM | POA: Diagnosis present

## 2016-12-22 DIAGNOSIS — J441 Chronic obstructive pulmonary disease with (acute) exacerbation: Secondary | ICD-10-CM | POA: Diagnosis not present

## 2016-12-22 LAB — COMPREHENSIVE METABOLIC PANEL
ALT: 9 U/L — ABNORMAL LOW (ref 14–54)
AST: 13 U/L — ABNORMAL LOW (ref 15–41)
Albumin: 3.1 g/dL — ABNORMAL LOW (ref 3.5–5.0)
Alkaline Phosphatase: 141 U/L — ABNORMAL HIGH (ref 38–126)
Anion gap: 7 (ref 5–15)
BILIRUBIN TOTAL: 0.4 mg/dL (ref 0.3–1.2)
BUN: 19 mg/dL (ref 6–20)
CHLORIDE: 104 mmol/L (ref 101–111)
CO2: 25 mmol/L (ref 22–32)
CREATININE: 0.97 mg/dL (ref 0.44–1.00)
Calcium: 8.7 mg/dL — ABNORMAL LOW (ref 8.9–10.3)
GFR, EST NON AFRICAN AMERICAN: 52 mL/min — AB (ref >60)
Glucose, Bld: 97 mg/dL (ref 65–99)
POTASSIUM: 5.1 mmol/L (ref 3.5–5.1)
Sodium: 136 mmol/L (ref 135–145)
TOTAL PROTEIN: 7.3 g/dL (ref 6.5–8.1)

## 2016-12-22 LAB — CBC WITH DIFFERENTIAL/PLATELET
Basophils Absolute: 0 10*3/uL (ref 0.0–0.1)
Basophils Relative: 0 %
EOS ABS: 0 10*3/uL (ref 0.0–0.7)
Eosinophils Relative: 0 %
HCT: 37.1 % (ref 36.0–46.0)
HEMOGLOBIN: 11.5 g/dL — AB (ref 12.0–15.0)
LYMPHS ABS: 1.4 10*3/uL (ref 0.7–4.0)
LYMPHS PCT: 19 %
MCH: 30.1 pg (ref 26.0–34.0)
MCHC: 31 g/dL (ref 30.0–36.0)
MCV: 97.1 fL (ref 78.0–100.0)
Monocytes Absolute: 0.7 10*3/uL (ref 0.1–1.0)
Monocytes Relative: 9 %
NEUTROS PCT: 72 %
Neutro Abs: 5.2 10*3/uL (ref 1.7–7.7)
PLATELETS: 272 10*3/uL (ref 150–400)
RBC: 3.82 MIL/uL — AB (ref 3.87–5.11)
RDW: 15.3 % (ref 11.5–15.5)
WBC: 7.3 10*3/uL (ref 4.0–10.5)

## 2016-12-22 LAB — GLUCOSE, CAPILLARY
GLUCOSE-CAPILLARY: 89 mg/dL (ref 65–99)
GLUCOSE-CAPILLARY: 145 mg/dL — AB (ref 65–99)
Glucose-Capillary: 158 mg/dL — ABNORMAL HIGH (ref 65–99)
Glucose-Capillary: 150 mg/dL — ABNORMAL HIGH (ref 65–99)

## 2016-12-22 MED ORDER — AMOXICILLIN-POT CLAVULANATE 875-125 MG PO TABS
1.0000 | ORAL_TABLET | Freq: Two times a day (BID) | ORAL | Status: DC
  Administered 2016-12-22 – 2016-12-25 (×6): 1 via ORAL
  Filled 2016-12-22 (×6): qty 1

## 2016-12-22 MED ORDER — HYDROCODONE-ACETAMINOPHEN 5-325 MG PO TABS
2.0000 | ORAL_TABLET | ORAL | Status: DC | PRN
  Administered 2016-12-22 – 2016-12-23 (×4): 2 via ORAL
  Filled 2016-12-22 (×5): qty 2

## 2016-12-22 MED ORDER — HYDROCODONE-ACETAMINOPHEN 5-325 MG PO TABS
1.0000 | ORAL_TABLET | Freq: Four times a day (QID) | ORAL | Status: DC | PRN
  Administered 2016-12-22 (×2): 1 via ORAL
  Filled 2016-12-22 (×2): qty 1

## 2016-12-22 MED ORDER — MORPHINE SULFATE (PF) 2 MG/ML IV SOLN
2.0000 mg | INTRAVENOUS | Status: DC | PRN
  Administered 2016-12-22: 2 mg via INTRAVENOUS
  Filled 2016-12-22: qty 1

## 2016-12-22 MED ORDER — ACETAMINOPHEN 325 MG PO TABS: 325.0000 mg | ORAL_TABLET | Freq: Four times a day (QID) | ORAL | Status: DC | PRN

## 2016-12-22 MED ORDER — MORPHINE SULFATE (PF) 2 MG/ML IV SOLN
2.0000 mg | INTRAVENOUS | Status: DC | PRN
  Administered 2016-12-22: 4 mg via INTRAVENOUS
  Administered 2016-12-22 – 2016-12-23 (×2): 2 mg via INTRAVENOUS
  Administered 2016-12-24 (×2): 4 mg via INTRAVENOUS
  Administered 2016-12-24: 2 mg via INTRAVENOUS
  Filled 2016-12-22: qty 2
  Filled 2016-12-22: qty 1
  Filled 2016-12-22: qty 2
  Filled 2016-12-22: qty 1
  Filled 2016-12-22: qty 2
  Filled 2016-12-22: qty 1

## 2016-12-22 MED ORDER — GUAIFENESIN ER 600 MG PO TB12
600.0000 mg | ORAL_TABLET | Freq: Two times a day (BID) | ORAL | Status: DC
  Administered 2016-12-22 – 2016-12-25 (×6): 600 mg via ORAL
  Filled 2016-12-22 (×6): qty 1

## 2016-12-22 NOTE — Evaluation (Signed)
Physical Therapy Evaluation Patient Details Name: Beverly Lewis MRN: [id number] DOB: 1928/09/08 Today's Date: 12/22/2016   History of Present Illness  83 yo female admitted with COPD exac, L flank pain. Hx of lung cancer, DM, HTN, AAA, CVA, obesity, PVD, gout.   Clinical Impression  On eval, pt required Max assist for bed mobility. She was only able to partially sit up at EOB. Pt rated pain 10/10 with activity. Pt presents with general weakness, decreased activity tolerance, and impaired gait and balance. Unsure of pt's PLOF-info pt provided was not clear. No family present during session. Recommend ST rehab at SNF if pt is agreeable. If pt refuses, then HHPT and 24 hour supervision/assist.     Follow Up Recommendations SNF (if pt will agree. If she refuses, then HHPT and 24 hour supervision/assist)    Equipment Recommendations  Wheelchair ;Rolling walker with 5" wheels    Recommendations for Other Services       Precautions / Restrictions Precautions Precautions: Fall Restrictions Weight Bearing Restrictions: No      Mobility  Bed Mobility Overal bed mobility: Needs Assistance Bed Mobility: Rolling;Supine to Sit Rolling: Max assist   Supine to sit: Max assist;HOB elevated     General bed mobility comments: Rolled towards L and R x 2. Attempted supine to sit x 2-pt only able to partially sit up. Unable to complete task with +1 assist. Limited by pain, fatigue.   Transfers                 General transfer comment: NT-pt unable   Ambulation/Gait                Stairs            Wheelchair Mobility    Modified Rankin (Stroke Patients Only)       Balance                                             Pertinent Vitals/Pain Pain Assessment: 0-10 Pain Score: 10-Worst pain ever Pain Location: L back, shoulder blade area Pain Descriptors / Indicators: Sharp;Sore;Discomfort;Aching Pain Intervention(s): Limited activity within  patient's tolerance;Repositioned    Home Living Family/patient expects to be discharged to:: Private residence Living Arrangements:  (grandsons) Available Help at Discharge: Family;Available PRN/intermittently Type of Home: House Home Access: Stairs to enter   Entrance Stairs-Number of Steps: 2 Home Layout: One level Home Equipment: Bedside commode;Wheelchair - manual (lift chair)      Prior Function Level of Independence: Needs assistance   Gait / Transfers Assistance Needed: minimal ambulation per pt.            Hand Dominance        Extremity/Trunk Assessment   Upper Extremity Assessment Upper Extremity Assessment: Generalized weakness    Lower Extremity Assessment Lower Extremity Assessment: Generalized weakness    Cervical / Trunk Assessment Cervical / Trunk Assessment: Kyphotic  Communication   Communication: No difficulties  Cognition Arousal/Alertness: Awake/alert Behavior During Therapy: WFL for tasks assessed/performed Overall Cognitive Status: Within Functional Limits for tasks assessed                                        General Comments      Exercises     Assessment/Plan  PT Assessment Patient needs continued PT services  PT Problem List Decreased strength;Decreased mobility;Decreased activity tolerance;Decreased balance;Decreased knowledge of use of DME;Pain;Decreased range of motion;Obesity       PT Treatment Interventions DME instruction;Gait training;Therapeutic activities;Therapeutic exercise;Patient/family education;Balance training;Functional mobility training    PT Goals (Current goals can be found in the Care Plan section)  Acute Rehab PT Goals Patient Stated Goal: less pain. to be able to get up PT Goal Formulation: With patient Time For Goal Achievement: 01/05/17 Potential to Achieve Goals: Fair    Frequency Min 3X/week   Barriers to discharge        Co-evaluation               AM-PAC PT  "6 Clicks" Daily Activity  Outcome Measure Difficulty turning over in bed (including adjusting bedclothes, sheets and blankets)?: Total Difficulty moving from lying on back to sitting on the side of the bed? : Total Difficulty sitting down on and standing up from a chair with arms (e.g., wheelchair, bedside commode, etc,.)?: Total Help needed moving to and from a bed to chair (including a wheelchair)?: A Lot Help needed walking in hospital room?: Total Help needed climbing 3-5 steps with a railing? : Total 6 Click Score: 7    End of Session   Activity Tolerance: Patient limited by fatigue;Patient limited by pain Patient left: in bed;with call bell/phone within reach   PT Visit Diagnosis: Muscle weakness (generalized) (M62.81);Difficulty in walking, not elsewhere classified (R26.2)    Time: 2440-1027 PT Time Calculation (min) (ACUTE ONLY): 35 min   Charges:   PT Evaluation $PT Eval Moderate Complexity: 1 Procedure PT Treatments $Therapeutic Activity: 8-22 mins   PT G Codes:   PT G-Codes **NOT FOR INPATIENT CLASS** Functional Assessment Tool Used: AM-PAC 6 Clicks Basic Mobility;Clinical judgement Functional Limitation: Mobility: Walking and moving around Mobility: Walking and Moving Around Current Status (O5366): At least 60 percent but less than 80 percent impaired, limited or restricted Mobility: Walking and Moving Around Goal Status [phone]): At least 20 percent but less than 40 percent impaired, limited or restricted    [name], MPT Pager: [phone]

## 2016-12-22 NOTE — Progress Notes (Signed)
Radiation Oncology         [phone] ________________________________  Initial inpatient Consultation  Name: Beverly Lewis MRN: [id number]  Date: 12/22/2016  DOB: 20-Aug-1928   REFERRING PHYSICIAN: Varney Biles  DIAGNOSIS: The encounter diagnosis was Non-small cell carcinoma of right lung, stage 3 ([place]).  HISTORY OF PRESENT ILLNESS::Beverly Lewis is a 83 y.o. female who is recently admitted to the hospital for uncontrollable pain along the left chest and left upper back area. . Chest CT scan shows progression of her disease in both lungs as well as progression of bilateral adrenal metastasis. Patient was recently seen by Dr. Earlie Server and is being considered for immunotherapy for management of her disease. Radiation therapy is been consulted for consideration for palliative treatments to aid in management of the patient's pain.  Patient also admits to some mild hemoptysis. She is short of breath and is on supplemental oxygen at 4 L. Patient is relatively comfortable until she begins to move or take a deep breath. Complains of pain up underneath the left breast and left scapula area.  PREVIOUS RADIATION THERAPY: Yes palliative radiotherapy to the right upper lobe lung mass under the care of Dr. Pablo Ledger completed September 2013.  CURRENT THERAPY: Second line treatment with immunotherapy with Nivolumab 240 mg IV every 2 weeks first dose to start  12/30/2016.  PAST MEDICAL HISTORY:  has a past medical history of AAA (abdominal aortic aneurysm) ([place]); Arthritis; Cerebrovascular accident [facility]) (history of mild cerebrovascular accident which caused some visual disturbance. ); Chronic fatigue (12/15/2016); Cough with hemoptysis (01/22/12); Diabetes mellitus; GERD (gastroesophageal reflux disease); History of radiation therapy (02/18/12,02/23/12,02/25/12., 03/01/12,&03/03/12); Hyperlipidemia; Hypertension; Lung cancer ([place]) (01/21/12); Lung mass; Morbid obesity ([place]); Peripheral vascular disease  ([place]); Pneumonia; Pneumothorax, iatrogenic (8/2/113-discharge note); Shortness of breath; and Tachycardia (01/22/12).    PAST SURGICAL HISTORY: Past Surgical History:  Procedure Laterality Date  . ABDOMINAL AORTIC ANEURYSM REPAIR  06/28/10   Stent Graft repair    . Aortogram   03/03/11   with stenting , left external iliac artery  . APPENDECTOMY    . EXPLORATORY LAPAROTOMY     For ectopic pregnancy  . LUMBAR DISC SURGERY    . Navigational Bronchoscopy     Right Upper Lobe Mass with Biopsies, Brushings, Washings, and bronchoalveolar Lavage:  . Paratracheal Adenopathy and Bilateral Adrenal Nodules      FAMILY HISTORY: family history includes Diabetes in her daughter, father, and mother; Heart disease in her father and mother; Hyperlipidemia in her father and mother; Hypertension in her father and mother.  SOCIAL HISTORY:  reports that she quit smoking about 10 months ago. Her smoking use included Cigarettes. She has a 32.50 pack-year smoking history. She has never used smokeless tobacco. She reports that she does not drink alcohol or use drugs.  ALLERGIES: Asa buff (mag [buffered aspirin]  MEDICATIONS:  No current facility-administered medications for this visit.    Current Outpatient Prescriptions  Medication Sig Dispense Refill  . albuterol (PROVENTIL HFA;VENTOLIN HFA) 108 (90 Base) MCG/ACT inhaler Inhale 1-2 puffs into the lungs every 4 (four) hours as needed for wheezing or shortness of breath. 1 Inhaler 0  . docusate sodium (COLACE) 100 MG capsule Take 1 capsule (100 mg total) by mouth every 12 (twelve) hours. 60 capsule 0  . HYDROcodone-acetaminophen (NORCO/VICODIN) 5-325 MG tablet Take 1 tablet by mouth every 6 (six) hours as needed. 15 tablet 0  . predniSONE (DELTASONE) 20 MG tablet Take 3 tablets (60 mg total) by mouth daily. 15 tablet  0   Facility-Administered Medications Ordered in Other Visits  Medication Dose Route Frequency Provider Last Rate Last Dose  . acetaminophen  (TYLENOL) tablet 325 mg  325 mg Oral Q6H PRN Florencia Reasons, MD      . albuterol (PROVENTIL) (2.5 MG/3ML) 0.083% nebulizer solution 2.5 mg  2.5 mg Nebulization Q2H PRN Reubin Milan, MD      . allopurinol (ZYLOPRIM) tablet 100 mg  100 mg Oral Daily Reubin Milan, MD   100 mg at 12/22/16 0947  . amoxicillin-clavulanate (AUGMENTIN) 875-125 MG per tablet 1 tablet  1 tablet Oral Q12H Florencia Reasons, MD   1 tablet at 12/22/16 1804  . aspirin EC tablet 81 mg  81 mg Oral Daily Reubin Milan, MD   81 mg at 12/22/16 1448  . atorvastatin (LIPITOR) tablet 20 mg  20 mg Oral Daily Reubin Milan, MD   20 mg at 12/22/16 0947  . enoxaparin (LOVENOX) injection 40 mg  40 mg Subcutaneous Q24H Reubin Milan, MD   40 mg at 12/21/16 2044  . gabapentin (NEURONTIN) capsule 300 mg  300 mg Oral TID Reubin Milan, MD   300 mg at 12/22/16 1650  . guaiFENesin (MUCINEX) 12 hr tablet 600 mg  600 mg Oral BID Florencia Reasons, MD   600 mg at 12/22/16 1804  . guaiFENesin-dextromethorphan (ROBITUSSIN DM) 100-10 MG/5ML syrup 5 mL  5 mL Oral Q4H PRN Reubin Milan, MD   5 mL at 12/21/16 2110  . HYDROcodone-acetaminophen (NORCO/VICODIN) 5-325 MG per tablet 2 tablet  2 tablet Oral Q4H PRN Florencia Reasons, MD   2 tablet at 12/22/16 1334  . insulin aspart (novoLOG) injection 0-15 Units  0-15 Units Subcutaneous TID WC Reubin Milan, MD   2 Units at 12/22/16 1651  . insulin aspart (novoLOG) injection 0-5 Units  0-5 Units Subcutaneous QHS Reubin Milan, MD      . ipratropium-albuterol (DUONEB) 0.5-2.5 (3) MG/3ML nebulizer solution 3 mL  3 mL Nebulization Q6H Reubin Milan, MD   3 mL at 12/22/16 1508  . irbesartan (AVAPRO) tablet 150 mg  150 mg Oral Daily Reubin Milan, MD   150 mg at 12/22/16 0947  . linagliptin (TRADJENTA) tablet 5 mg  5 mg Oral Daily Reubin Milan, MD   5 mg at 12/22/16 0947  . methylPREDNISolone sodium succinate (SOLU-MEDROL) 125 mg/2 mL injection 80 mg  80 mg Intravenous Q12H  Reubin Milan, MD   80 mg at 12/22/16 0757  . morphine 2 MG/ML injection 2-4 mg  2-4 mg Intravenous Q2H PRN Micheline Rough, MD   4 mg at 12/22/16 1804  . ondansetron (ZOFRAN) tablet 4 mg  4 mg Oral Q6H PRN Reubin Milan, MD       Or  . ondansetron ) injection 4 mg  4 mg Intravenous Q6H PRN Reubin Milan, MD      . pantoprazole (PROTONIX) EC tablet 40 mg  40 mg Oral Daily Reubin Milan, MD   40 mg at 12/22/16 1856    REVIEW OF SYSTEMS:    Left-sided chest pain with deep inspiration. Mild hemoptysis as above. Patient denies any headaches or visual problems.   PHYSICAL EXAM:  Vitals - 1 value per visit 08/20/4968  SYSTOLIC 263  DIASTOLIC 62  Pulse 83  Temperature 97.8  Respirations 20  Weight (lb)   Height   BMI   VISIT REPORT    This is a very pleasant 83 year old female lying  in her hospital bed. She has oxygen in place by nasal cannula, 4 L. Patient has some blood-tinged tissues at bedside. Examination of the lungs reveals breath sounds in general distant but clear. The heart has a regular rhythm and rate. Palpation along the left anterior chest underneath the left breast area reveals no point tenderness or palpable mass. Palpation along the left upper back and scapular region reveals no point tenderness area.  With any movement of the chest region the patient complains of excruciating pain.   ECOG = 4  LABORATORY DATA:  Lab Results  Component Value Date   WBC 7.3 12/22/2016   HGB 11.5 (L) 12/22/2016   HCT 37.1 12/22/2016   MCV 97.1 12/22/2016   PLT 272 12/22/2016   NEUTROABS 5.2 12/22/2016   Lab Results  Component Value Date   NA 136 12/22/2016   K 5.1 12/22/2016   CL 104 12/22/2016   CO2 25 12/22/2016   GLUCOSE 97 12/22/2016   CREATININE 0.97 12/22/2016   CALCIUM 8.7 (L) 12/22/2016      RADIOGRAPHY: Ct Angio Chest Pe W And/or Wo Contrast  Result Date: 12/21/2016 CLINICAL DATA:  Chest pain, left-sided chest wall pain and history of lung  carcinoma. EXAM: CT ANGIOGRAPHY CHEST WITH CONTRAST TECHNIQUE: Multidetector CT imaging of the chest was performed using the standard protocol during bolus administration of intravenous contrast. Multiplanar CT image reconstructions and MIPs were obtained to evaluate the vascular anatomy. CONTRAST:  100 mL Isovue 370 IV COMPARISON:  CT of the chest without contrast on 10/06/2016 as well as multiple prior additional studies. FINDINGS: Cardiovascular: The pulmonary arteries are well opacified and demonstrate no evidence of pulmonary embolism. Stable heart size and calcified coronary artery plaque in the distribution of the LAD and left circumflex coronary arteries. The thoracic aorta demonstrates stable atherosclerosis with irregular calcified and noncalcified plaque present, some of which appears ulcerated. Stable mild aneurysmal dilatation of the ascending thoracic aorta measures approximately 4.3 cm. Stable thrombosed penetrating ulcer creating 2.5 cm aneurysmal outpouching along the superior aspect of the proximal descending thoracic aorta along the posterior aspect of the aortic arch. No pericardial effusion identified. Mediastinum/Nodes: Interval enlargement of right lower paratracheal lymph node measuring 1.6 cm in short axis compared to approximately 1.3 cm previously. Lungs/Pleura: There is progression of malignancy since prior imaging. Disease in the right upper lobe appears fairly stable. Confluent masses in the posterior perihilar region of the right lung extending into the right lower lobe are more prominent with maximal conglomerate transverse diameter of tumor measuring approximately 4 x 7.5 cm. Spiculated nodule in the left upper lobe on image number 27 appears fairly stable and measures approximately 1.7 cm. Nodule in the superior segment of the left lower lobe on image number 30 measures 8 mm and is fairly stable. There is progression of tumor in the posterior and inferior left lower lobe with  merging of separate areas of tumor seen previously into a more confluent area of tumor now measuring approximately 5 cm in greatest diameter. Irregular areas of lobulated tumor at the right lung base appear fairly stable and measure approximately 8.3 cm in greatest transverse diameter. Upper Abdomen: Progression of bilateral adrenal metastases with left adrenal metastasis now measuring 3.6 x 4.5 cm compared to approximately 3.3 cm in maximal diameter previously and right adrenal metastasis measuring 2.6 x 3.0 cm compared to 2 cm previously. Musculoskeletal: No chest wall abnormality. No acute or significant osseous findings. Review of the MIP images confirms the above findings.  IMPRESSION: 1. No evidence of acute pulmonary embolism. 2. Progression of lung carcinoma with enlarged right lower paratracheal lymph node and progression of left lower lobe tumor and bilateral adrenal metastases. Malignancy in the right lung and left upper lung appears relatively stable. 3. 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 Electronically Signed   By: Aletta Edouard M.D.   On: 12/21/2016 09:14      IMPRESSION: Stage IIIA (T3, N2, M0) non-small cell lung cancer. The patient now has metastatic disease to the bilateral adrenal gland area bilateral lung metastasis. I carefully reviewed the patient's most recent chest CT scan and I do not have an immediate explanation for her pain complaints. I therefore do not have a good target  to aid with palliative radiation therapy.  I discussed these issues with the patient and she appears to understand. She is having some mild hemoptysis and if that this worsens she would be a candidate for palliative radiation therapy directed at the right central chest. She has progressive disease which appears to be involving the bronchus along the side which is likely causing her hemoptysis and this could be  potentially helped with palliative radiation therapy if it worsens.  PLAN: No immediate plans for radiation therapy unless the patient's hemoptysis worsens. She does wish to continue to be considered for immunotherapy and does not wish at this time to proceed with hospice.     ------------------------------------------------  Blair Promise, PhD, MD

## 2016-12-22 NOTE — Progress Notes (Signed)
Notified by nursing student that patient took unidentified medication. I asked the patient what she took and she stated "Don't worry about it, it was for my foot". I asked patient if she had any additional meds and she replied "Everything is in my bag and no I won't give you anything". I explained to the patient that taking meds without the nurse's knowledge is a safety concern and that we wouldn't want anything to happen because of a contraindication with meds. I removed patient's belongings bag from room, gave her her purse, notified assigned nurse who then called patient's grandson [name] to come to pick up meds. Patient very upset, cursing, calling me and student names. MD notified via text message.

## 2016-12-22 NOTE — Progress Notes (Cosign Needed)
Pt asked me to get her bag out of the closet. I handed her the belongings. While documenting, pt was fumbling through her purse. I turned to see what she was doing, pt had a large yellow prescribed medication bottle out removing the lid. I asked her what it was. Pt stated "it's a pill for my foot." I asked her to put it away & she could not take that while she was here because it was a medication from home. She took 1 pill from what I could see and hurried to put the bottle back in her purse. I repeatedly asked her what was the name of the medication. Pt only stated it was for her foot, no name was given. I told her I would have to tell her nurse that she took outside medication and pt got very hostile. I put her belongings back in the closet and reported it to the Department director.

## 2016-12-22 NOTE — Progress Notes (Signed)
PT Cancellation Note  Patient Details Name: [name] MRN: [id number] DOB: 1928/10/19   Cancelled Treatment:    Reason Eval/Treat Not Completed: Pain limiting ability to participate. Attempted PT eval-pt declined to participate.  Will check back as schedule allows. Thanks.    [name], MPT Pager: [phone]

## 2016-12-22 NOTE — Progress Notes (Signed)
PROGRESS NOTE  Beverly Lewis EGB:[id number] DOB: Nov 02, 1928 DOA: 12/21/2016 PCP: Nolene Ebbs, MD  HPI/Recap of past 24 hours:  C/o left sided chest pain, productive cough, no fever, on 3liter oxygen ( not on home o2 at baseline)  Assessment/Plan: Principal Problem:   COPD exacerbation (HCC) Active Problems:   Non-small cell carcinoma of lung, stage 3 (HCC)   Essential hypertension, benign   GERD without esophagitis   Chronic gout of multiple sites   Dyslipidemia   Type II diabetes mellitus with peripheral circulatory disorder (HCC)   COPD exacerbation ([place]), acute hypoxic respiratory failure ( suspect underline chronic respiratory failure, she reports not on home 02) -She presented to the ED due to significant left sided chest wall pain and increased cough, she is found to have wheezing and hypoxia _CTA no PE , does show progressive lung cancer -iv steroids/nebs started in the ED, start augmentin to cover possible infection, start mucinex . -remain wheezing on exam on 7/3, continue current dose of iv steroids,  -taper steroids and wean oxygen when wheezing improves.    Non-small cell carcinoma of lung, stage 4 (HCC) -CTA: "Progression of lung carcinoma with enlarged right lower paratracheal lymph node and progression of left lower lobe tumor and bilateral adrenal metastases. Malignancy in the right lung and left upper lung appears relatively stable" -"There is progression of tumor in the posterior and inferior left lower lobe with merging of separate areas of tumor seen previously into a more confluent area of tumor now measuring approximately 5 cm in greatest diameter"   -patient c/o significant left sided chest wall pain, needing frequent iv and oral prn pain analgesics , I suspect this pain is related to left sided cancer progression,  I have discussed case with radiation oncology Dr Sondra Come for possible palliative radiation for pain control.  Possible simulation on 7/5  per Dr Sondra Come. -I have also consulted palliative care Dr Domingo Cocking for cancer pain control and goals of care discussion  - Oncology Dr Julien Nordmann notified through epic.  per Dr Julien Nordmann note from 6/26,"I had a lengthy discussion with the patient and her grandson about her condition. I explained to her that she has incurable condition and on the treatment will be off palliative nature. I gave the patient the option of palliative care and hospice referral versus consideration of treatment with second line immunotherapy with Nivolumab 240 mg IV every 2 weeks." "The patient is interested in the treatment with immunotherapy and she is expected to start the first dose of this treatment on 12/30/2016."  Noninsulin dependent Type II diabetes mellitus with peripheral circulatory disorder (HCC) Carbohydrate modified diet. Continue Januvia 100 mg by mouth daily. CBG monitoring with regular insulin sliding scale all in the hospital.   Essential hypertension, benign Continue Teveten 600 mg daily or generic equivalent. hold hydrochlorothiazide 25 mg daily , plan to resume at discharge Monitor blood pressure, renal function and electrolytes.   GERD without esophagitis Protonix 40 mg by mouth daily.   Chronic gout of multiple sites Continue allopurinol 100 mg by mouth daily. Analgesics as needed.   Dyslipidemia Continue atorvastatin 20 mg by mouth daily. Monitor LFTs. Lipid panel follow-up as an outpatient.       Abnormal EKG , with poor R wave progression, diffuse nonspecific T wave flattening which is chronic when compare to ekg from 2016 Troponin negative,  I donot think echocardiogram is necessary during this hospitalization unless there is clinically changes that warrant echo.  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  FTT: will get PT eval,  Body mass index is  34.95 kg/m.   DVT prophylaxis: Lovenox SQ. Code Status: Full code. Family Communication: patient Disposition Plan: pending, may need SNF if patient agrees to, may need home o2  Consultants:   Radiation oncology  Palliative care  Procedures:  none  Antibiotics:  augmentin    Objective: BP 137/62 (BP Location: Right Arm)   Pulse 83   Temp 97.8 F (36.6 C) (Axillary)   Resp 20   Ht 5\' 3"  (1.6 m)   Wt 89.5 kg (197 lb 4.8 oz)   SpO2 98%   BMI 34.95 kg/m   Intake/Output Summary (Last 24 hours) at 12/22/16 1812 Last data filed at 12/22/16 1200  Gross per 24 hour  Intake          1686.25 ml  Output                0 ml  Net          1686.25 ml   Filed Weights   12/21/16 1837  Weight: 89.5 kg (197 lb 4.8 oz)    Exam:   General:  NAD  Cardiovascular: RRR  Respiratory: scatter bilateral wheezing, left sided chest wall tenderness  Abdomen: Soft/ND/NT, positive BS  Musculoskeletal: No Edema  Neuro: aaox3  Data Reviewed: Basic Metabolic Panel:  Recent Labs Lab 12/21/16 0558 12/21/16 0610 12/22/16 0639  NA 140 141 136  K 4.7 4.7 5.1  CL 107 106 104  CO2 25  --  25  GLUCOSE 84 81 97  BUN 24* 24* 19  CREATININE 1.13* 1.10* 0.97  CALCIUM 8.7*  --  8.7*   Liver Function Tests:  Recent Labs Lab 12/21/16 0558 12/22/16 0639  AST 14* 13*  ALT 10* 9*  ALKPHOS 149* 141*  BILITOT 0.5 0.4  PROT 7.3 7.3  ALBUMIN 3.0* 3.1*   No results for input(s): LIPASE, AMYLASE in the last 168 hours. No results for input(s): AMMONIA in the last 168 hours. CBC:  Recent Labs Lab 12/21/16 0558 12/21/16 0610 12/22/16 0639  WBC 6.7  --  7.3  NEUTROABS 4.1  --  5.2  HGB 12.2 13.9 11.5*  HCT 38.5 41.0 37.1  MCV 98.2  --  97.1  PLT 269  --  272   Cardiac Enzymes:   No results for input(s): CKTOTAL, CKMB, CKMBINDEX, TROPONINI in the last 168 hours. BNP (last 3 results) No results for input(s): BNP in the last 8760 hours.  ProBNP (last 3 results) No  results for input(s): PROBNP in the last 8760 hours.  CBG:  Recent Labs Lab 12/21/16 1633 12/21/16 2049 12/22/16 0745 12/22/16 1135 12/22/16 1638  GLUCAP 166* 173* 89 158* 145*    No results found for this or any previous visit (from the past 240 hour(s)).   Studies: No results found.  Scheduled Meds: . allopurinol  100 mg Oral Daily  . amoxicillin-clavulanate  1 tablet Oral Q12H  . aspirin EC  81 mg Oral Daily  . atorvastatin  20 mg Oral Daily  . enoxaparin (LOVENOX) injection  40 mg Subcutaneous Q24H  . gabapentin  300 mg Oral TID  . guaiFENesin  600 mg Oral BID  . insulin aspart  0-15 Units Subcutaneous TID WC  . insulin aspart  0-5 Units Subcutaneous QHS  . ipratropium-albuterol  3 mL Nebulization Q6H  . irbesartan  150 mg Oral Daily  . linagliptin  5 mg Oral Daily  . methylPREDNISolone (SOLU-MEDROL) injection  80 mg Intravenous Q12H  . pantoprazole  40 mg Oral Daily    Continuous Infusions:    Time spent: 52mins  Infant Doane MD, PhD  [facility] Pager [phone]. If 7PM-7AM, please contact night-coverage at [email or web address], password [facility] 12/22/2016, 6:12 PM  LOS: 0 days

## 2016-12-22 NOTE — Consult Note (Signed)
Consultation Note Date: 12/22/2016   Patient Name: Beverly Lewis  DOB: 01/10/1929  MRN: [id number]  Age / Sex: 83 y.o., female  PCP: Nolene Ebbs, MD Referring Physician: Florencia Reasons, MD  Reason for Consultation: Establishing goals of care and Pain control  HPI/Patient Profile: 83 y.o. female  with past medical history of AAA, osteoarthritis, CVA, chronic fatigue, type 2 diabetes, GERD, hyperlipidemia, hypertension, non-small cell lung cancer status post chemotherapy and radiotherapy admitted on 12/21/2016 with COPD exacerbation and uncontrolled pain.  Palliative consulted for goals of care and pain management recommendations.   Clinical Assessment and Goals of Care: I met today with Beverly Lewis.  She reports being in 10 out of 10 pain and that she does not want to talk about any long-term goals and how her pain is under better control.  She reports her pain is 10 out of 10, sharp, located in the left side near costal margin anteriorly, does not radiate, mild relief (8 out of 10) with pain medication, worse with certain movements and deep breaths.  She states that his same pain that she has had before in the past but it just seems to be worsened over the last few days. She was taking her pain medication at home (Vicodin 5/325 as confirmed by [facility] substance registry) with mild relief at home.  Other accompanying factors are shortness of breath that she attributes to her COPD as well as having pain with deep inspiration which therefore limits her ability to take deeper breaths.  SUMMARY OF RECOMMENDATIONS   - She has received Vicodin 10 mg as well as morphine 2 mg IV without any relief in symptoms. We'll therefore plan to dose escalate to morphine 2-4 mg every 2 hours as needed for pain overnight. Once we have better understanding of her overall needs, will work to establish an outpatient regimen for  when she is discharged home. - She reports having good bowel movement. Continue to monitor increased bowel regimen is necessary due to concern for opioid-induced constipation. - She confirms desire for full code. She states that she does not want to discuss this any further until her pain is better managed. - I agree with plan to have radiation oncology evaluate to see if they have anything to offer for palliative/pain management. - We'll follow up again tomorrow  Code Status/Advance Care Planning:  Full code   Symptom Management:   As above  Palliative Prophylaxis:   Bowel Regimen and Frequent Pain Assessment  Additional Recommendations (Limitations, Scope, Preferences):  Full Scope Treatment  Psycho-social/Spiritual:   Desire for further Chaplaincy support:no  Additional Recommendations: Caregiving  Support/Resources  Prognosis:   Unable to determine  Discharge Planning: To Be Determined      Primary Diagnoses: Present on Admission: . COPD exacerbation ([place]) . Non-small cell carcinoma of lung, stage 3 ([place]) . Type II diabetes mellitus with peripheral circulatory disorder (HCC) . Dyslipidemia . GERD without esophagitis . Essential hypertension, benign . Chronic gout of multiple sites   I have reviewed the  medical record, interviewed the patient and family, and examined the patient. The following aspects are pertinent.  Past Medical History:  Diagnosis Date  . AAA (abdominal aortic aneurysm) ([place])   . Arthritis   . Cerebrovascular accident [facility]) history of mild cerebrovascular accident which caused some visual disturbance.   . Chronic fatigue 12/15/2016  . Cough with hemoptysis 01/22/12   Prior to discharge  . Diabetes mellitus   . GERD (gastroesophageal reflux disease)   . History of radiation therapy 02/18/12,02/23/12,02/25/12., 03/01/12,&03/03/12   RULlung 50Gy/5/fx  . Hyperlipidemia   . Hypertension   . Lung cancer ([place]) 01/21/12  . Lung mass    Right upper  Lobe  . Morbid obesity ([place])   . Peripheral vascular disease (HCC)    hx blood clot 30 yrs ago leg  . Pneumonia    hx  . Pneumothorax, iatrogenic 8/2/113-discharge note   Post Biospy  . Shortness of breath   . Tachycardia 01/22/12   Prior to discharge   Social History   Social History  . Marital status: Widowed    Spouse name: N/A  . Number of children: N/A  . Years of education: N/A   Social History Main Topics  . Smoking status: Former Smoker    Packs/day: 0.50    Years: 65.00    Types: Cigarettes    Quit date: 02/20/2016  . Smokeless tobacco: Never Used  . Alcohol use No     Comment: She does not consume alcohol on a regular basis.  . Drug use: No  . Sexual activity: No   Other Topics Concern  . None   Social History Narrative  . None   Family History  Problem Relation Age of Onset  . Heart disease Mother   . Hypertension Mother   . Diabetes Mother   . Hyperlipidemia Mother   . Heart disease Father   . Hypertension Father   . Diabetes Father   . Hyperlipidemia Father   . Diabetes Daughter    Scheduled Meds: . allopurinol  100 mg Oral Daily  . amoxicillin-clavulanate  1 tablet Oral Q12H  . aspirin EC  81 mg Oral Daily  . atorvastatin  20 mg Oral Daily  . enoxaparin (LOVENOX) injection  40 mg Subcutaneous Q24H  . gabapentin  300 mg Oral TID  . guaiFENesin  600 mg Oral BID  . insulin aspart  0-15 Units Subcutaneous TID WC  . insulin aspart  0-5 Units Subcutaneous QHS  . ipratropium-albuterol  3 mL Nebulization Q6H  . irbesartan  150 mg Oral Daily  . linagliptin  5 mg Oral Daily  . methylPREDNISolone (SOLU-MEDROL) injection  80 mg Intravenous Q12H  . pantoprazole  40 mg Oral Daily   Continuous Infusions: PRN Meds:.acetaminophen, albuterol, guaiFENesin-dextromethorphan, HYDROcodone-acetaminophen, morphine injection, ondansetron **OR** ondansetron (ZOFRAN) IV Medications Prior to Admission:  Prior to Admission medications   Medication Sig Start Date End  Date Taking? Authorizing Provider  acetaminophen (TYLENOL) 500 MG tablet Take 500 mg by mouth every 6 (six) hours as needed for moderate pain (inflammation).   Yes [provider]  allopurinol (ZYLOPRIM) 100 MG tablet Take 100 mg by mouth daily.    Yes [provider]  aspirin EC 81 MG tablet Take 81 mg by mouth daily.   Yes [provider]  atorvastatin (LIPITOR) 20 MG tablet Take 20 mg by mouth daily.     Yes [provider]  cholecalciferol (VITAMIN D) 1000 UNITS tablet Take 1,000 Units by mouth daily.  Yes [provider]  eprosartan (TEVETEN) 600 MG tablet Take 600 mg by mouth daily. As directed 04/15/15  Yes [provider]  gabapentin (NEURONTIN) 300 MG capsule Take 300 mg by mouth 3 (three) times daily. As directed 05/23/15  Yes [provider]  hydrochlorothiazide (HYDRODIURIL) 25 MG tablet Take 25 mg by mouth daily. As directed 04/15/15  Yes [provider]  JANUVIA 100 MG tablet Take 100 mg by mouth daily.  03/11/11  Yes [provider]  pantoprazole (PROTONIX) 40 MG tablet Take 40 mg by mouth daily.    Yes [provider]  albuterol (PROVENTIL HFA;VENTOLIN HFA) 108 (90 Base) MCG/ACT inhaler Inhale 1-2 puffs into the lungs every 4 (four) hours as needed for wheezing or shortness of breath. 12/21/16   Ward, Delice Bison, DO  docusate sodium (COLACE) 100 MG capsule Take 1 capsule (100 mg total) by mouth every 12 (twelve) hours. 12/21/16   Ward, Delice Bison, DO  HYDROcodone-acetaminophen (NORCO/VICODIN) 5-325 MG tablet Take 1 tablet by mouth every 6 (six) hours as needed. 12/21/16   Ward, Delice Bison, DO  predniSONE (DELTASONE) 20 MG tablet Take 3 tablets (60 mg total) by mouth daily. 12/21/16   Ward, Delice Bison, DO   Allergies  Allergen Reactions  . Asa Buff (Mag [Buffered Aspirin]     In tolerance in higher dosages.   Review of Systems  Constitutional: Positive for activity change, appetite change and fatigue.    Musculoskeletal: Positive for back pain, myalgias and neck pain.  Neurological: Positive for weakness.  Psychiatric/Behavioral: Positive for sleep disturbance.   Physical Exam  General: Alert, awake, in mild distress (pain).  HEENT: No bruits, no goiter, no JVD Heart: Regular rate and rhythm. No murmur appreciated. Lungs: Diminiished air movement, bilateral scattered wheeze.  No crackles appreciated. Abdomen: Soft, nontender, nondistended, positive bowel sounds.  Ext: No significant edema Skin: Warm and dry Neuro: Grossly intact, nonfocal.   Vital Signs: BP 137/62 (BP Location: Right Arm)   Pulse 83   Temp 97.8 F (36.6 C) (Axillary)   Resp 20   Ht '5\' 3"'$  (1.6 m)   Wt 89.5 kg (197 lb 4.8 oz)   SpO2 98%   BMI 34.95 kg/m  Pain Assessment: 0-10   Pain Score: Asleep   SpO2: SpO2: 98 % O2 Device:SpO2: 98 % O2 Flow Rate: .O2 Flow Rate (L/min): 3 L/min  IO: Intake/output summary:  Intake/Output Summary (Last 24 hours) at 12/22/16 1859 Last data filed at 12/22/16 1200  Gross per 24 hour  Intake          1423.75 ml  Output                0 ml  Net          1423.75 ml    LBM: Last BM Date: 12/20/16 Baseline Weight: Weight: 89.5 kg (197 lb 4.8 oz) Most recent weight: Weight: 89.5 kg (197 lb 4.8 oz)     Palliative Assessment/Data:   Flowsheet Rows     Most Recent Value  Intake Tab  Referral Department  Hospitalist  Unit at Time of Referral  Med/Surg Unit  Palliative Care Primary Diagnosis  Cancer  Date Notified  12/22/16  Palliative Care Type  New Palliative care  Reason for referral  Clarify Goals of Care, Pain  Date of Admission  12/21/16  Date first seen by Palliative Care  12/22/16  # of days Palliative referral response time  0 Day(s)  # of days IP  prior to Palliative referral  1  Clinical Assessment  Palliative Performance Scale Score  50%  Pain Max last 24 hours  10  Pain Min Last 24 hours  8  Psychosocial & Spiritual Assessment  Palliative Care  Outcomes  Patient/Family meeting held?  Yes  Who was at the meeting?  Patient  Palliative Care Outcomes  Improved pain interventions     Time in: 1725 Time out: 1825 Time Total: 60 Greater than 50%  of this time was spent counseling and coordinating care related to the above assessment and plan.  Signed by: Micheline Rough, MD   Please contact Palliative Medicine Team phone at [phone] for questions and concerns.  For individual provider: See Shea Evans

## 2016-12-22 NOTE — Progress Notes (Signed)
Pt is hollering out in pain and complains of severe pain with any movement. MD has been paged twice for additional pain medicine

## 2016-12-23 DIAGNOSIS — I10 Essential (primary) hypertension: Secondary | ICD-10-CM

## 2016-12-23 DIAGNOSIS — E1151 Type 2 diabetes mellitus with diabetic peripheral angiopathy without gangrene: Secondary | ICD-10-CM

## 2016-12-23 DIAGNOSIS — M1A49X0 Other secondary chronic gout, multiple sites, without tophus (tophi): Secondary | ICD-10-CM

## 2016-12-23 DIAGNOSIS — R0789 Other chest pain: Secondary | ICD-10-CM

## 2016-12-23 DIAGNOSIS — J441 Chronic obstructive pulmonary disease with (acute) exacerbation: Principal | ICD-10-CM

## 2016-12-23 DIAGNOSIS — T451X5A Adverse effect of antineoplastic and immunosuppressive drugs, initial encounter: Secondary | ICD-10-CM

## 2016-12-23 DIAGNOSIS — J9621 Acute and chronic respiratory failure with hypoxia: Secondary | ICD-10-CM

## 2016-12-23 DIAGNOSIS — D6481 Anemia due to antineoplastic chemotherapy: Secondary | ICD-10-CM

## 2016-12-23 DIAGNOSIS — C3491 Malignant neoplasm of unspecified part of right bronchus or lung: Secondary | ICD-10-CM

## 2016-12-23 LAB — CBC
HCT: 37.7 % (ref 36.0–46.0)
HEMOGLOBIN: 11.8 g/dL — AB (ref 12.0–15.0)
MCH: 30.5 pg (ref 26.0–34.0)
MCHC: 31.3 g/dL (ref 30.0–36.0)
MCV: 97.4 fL (ref 78.0–100.0)
Platelets: 268 10*3/uL (ref 150–400)
RBC: 3.87 MIL/uL (ref 3.87–5.11)
RDW: 15.3 % (ref 11.5–15.5)
WBC: 8.1 10*3/uL (ref 4.0–10.5)

## 2016-12-23 LAB — BASIC METABOLIC PANEL
Anion gap: 8 (ref 5–15)
BUN: 22 mg/dL — AB (ref 6–20)
CALCIUM: 9 mg/dL (ref 8.9–10.3)
CO2: 28 mmol/L (ref 22–32)
CREATININE: 1.12 mg/dL — AB (ref 0.44–1.00)
Chloride: 101 mmol/L (ref 101–111)
GFR calc Af Amer: 50 mL/min — ABNORMAL LOW (ref >60)
GFR calc non Af Amer: 44 mL/min — ABNORMAL LOW (ref >60)
Glucose, Bld: 132 mg/dL — ABNORMAL HIGH (ref 65–99)
Potassium: 5.3 mmol/L — ABNORMAL HIGH (ref 3.5–5.1)
SODIUM: 137 mmol/L (ref 135–145)

## 2016-12-23 LAB — MAGNESIUM: MAGNESIUM: 2 mg/dL (ref 1.7–2.4)

## 2016-12-23 LAB — GLUCOSE, CAPILLARY
GLUCOSE-CAPILLARY: 140 mg/dL — AB (ref 65–99)
Glucose-Capillary: 145 mg/dL — ABNORMAL HIGH (ref 65–99)
Glucose-Capillary: 112 mg/dL — ABNORMAL HIGH (ref 65–99)
Glucose-Capillary: 137 mg/dL — ABNORMAL HIGH (ref 65–99)

## 2016-12-23 LAB — URIC ACID: Uric Acid, Serum: 5.5 mg/dL (ref 2.3–6.6)

## 2016-12-23 MED ORDER — SODIUM CHLORIDE 0.9 % IV SOLN
INTRAVENOUS | Status: AC
  Administered 2016-12-23: 08:00:00 via INTRAVENOUS

## 2016-12-23 MED ORDER — PREDNISONE 50 MG PO TABS
50.0000 mg | ORAL_TABLET | Freq: Every day | ORAL | Status: DC
  Administered 2016-12-24 – 2016-12-25 (×2): 50 mg via ORAL
  Filled 2016-12-23: qty 1

## 2016-12-23 MED ORDER — SODIUM CHLORIDE 0.9 % IV BOLUS (SEPSIS)
1000.0000 mL | Freq: Once | INTRAVENOUS | Status: AC
  Administered 2016-12-23: 1000 mL via INTRAVENOUS

## 2016-12-23 NOTE — Progress Notes (Signed)
Daily Progress Note   Patient Name: Beverly Lewis       Date: 12/23/2016 DOB: June 05, 1929  Age: 83 y.o. MRN#: [id number] Attending Physician: Charlynne Cousins, MD Primary Care Physician: Nolene Ebbs, MD Admit Date: 12/21/2016  Reason for Consultation/Follow-up: Establishing goals of care and Pain control  Subjective: Met with Beverly Lewis today.  She reports that her pain is much better today than yesterday. She states that her pain seems to "eased off" since getting 4 mg IV morphine 1 time yesterday. Since then she has gotten 2 additional doses of IV morphine, but at lower 2 mg dose.  I also brought up again with her her long-term goals of care. She reports understanding that she has incurable disease but is hopeful that offered therapies will add time and quality to her life. I discussed with her that in light of her incurable disease, interventions moving forward should be focused on things that will help her in achieving goal of being well enough to spend time with her family and return to her home.  Discussed concern that aggressive measures in the event of cardiac or respiratory arrest would be unlikely to place her on a pathway towards these goal. She reports that she will continue to consider, but she would like to pursue all available interventions for now.  Length of Stay: 1  Current Medications: Scheduled Meds:  . allopurinol  100 mg Oral Daily  . amoxicillin-clavulanate  1 tablet Oral Q12H  . aspirin EC  81 mg Oral Daily  . atorvastatin  20 mg Oral Daily  . enoxaparin (LOVENOX) injection  40 mg Subcutaneous Q24H  . gabapentin  300 mg Oral TID  . guaiFENesin  600 mg Oral BID  . insulin aspart  0-15 Units Subcutaneous TID WC  . insulin aspart  0-5 Units Subcutaneous QHS   . ipratropium-albuterol  3 mL Nebulization Q6H  . irbesartan  150 mg Oral Daily  . linagliptin  5 mg Oral Daily  . pantoprazole  40 mg Oral Daily  . [START ON 12/24/2016] predniSONE  50 mg Oral Q breakfast    Continuous Infusions: . sodium chloride 10 mL/hr at 12/23/16 0935  . sodium chloride      PRN Meds: albuterol, guaiFENesin-dextromethorphan, HYDROcodone-acetaminophen, morphine injection, ondansetron **OR** ondansetron (ZOFRAN) IV  Physical Exam  General: Alert, awake, in no distress (appears much more comfortable today).  HEENT: No bruits, no goiter, no JVD Heart: Regular rate and rhythm. No murmur appreciated. Lungs: Improved air movement, no significant wheeze.  No crackles appreciated. Abdomen: Soft, nontender, nondistended, positive bowel sounds.  Ext: No significant edema Skin: Warm and dry Neuro: Grossly intact, nonfocal.  Vital Signs: BP (!) 148/92   Pulse 76   Temp 98.2 F (36.8 C) (Oral)   Resp 18   Ht _0  (1.6 m)   Wt 89.5 kg (197 lb 4.8 oz)   SpO2 95%   BMI 34.95 kg/m  SpO2: SpO2: 95 % O2 Device: O2 Device: Nasal Cannula O2 Flow Rate: O2 Flow Rate (L/min): 4 L/min  Intake/output summary:  Intake/Output Summary (Last 24 hours) at 12/23/16 1100 Last data filed at 12/23/16 1054  Gross per 24 hour  Intake              340 ml  Output              800 ml  Net             -460 ml   LBM: Last BM Date: 12/20/16 Baseline Weight: Weight: 89.5 kg (197 lb 4.8 oz) Most recent weight: Weight: 89.5 kg (197 lb 4.8 oz)       Palliative Assessment/Data:    Flowsheet Rows     Most Recent Value  Intake Tab  Referral Department  Hospitalist  Unit at Time of Referral  Med/Surg Unit  Palliative Care Primary Diagnosis  Cancer  Date Notified  12/22/16  Palliative Care Type  New Palliative care  Reason for referral  Clarify Goals of Care, Pain  Date of Admission  12/21/16  Date first seen by Palliative Care  12/22/16  # of days Palliative referral  response time  0 Day(s)  # of days IP prior to Palliative referral  1  Clinical Assessment  Palliative Performance Scale Score  50%  Pain Max last 24 hours  10  Pain Min Last 24 hours  8  Psychosocial & Spiritual Assessment  Palliative Care Outcomes  Patient/Family meeting held?  Yes  Who was at the meeting?  Patient  Palliative Care Outcomes  Improved pain interventions      Patient Active Problem List   Diagnosis Date Noted  . Acute on chronic respiratory failure with hypoxia ([place]) 12/23/2016  . COPD exacerbation ([place]) 12/21/2016  . Chronic fatigue 12/15/2016  . PAD (peripheral artery disease) ([place]) 10/01/2015  . Essential hypertension, benign 02/18/2015  . GERD without esophagitis 02/18/2015  . Hypokalemia 02/18/2015  . Chronic gout of multiple sites 02/18/2015  . Dyslipidemia 02/18/2015  . Type II diabetes mellitus with peripheral circulatory disorder ([place]) 02/18/2015  . Morbid obesity ([place]) 02/13/2015  . Erythema multiforme bullosum ([place]) 02/13/2015  . Anemia 02/12/2015  . Blister of lower leg 02/12/2015  . Pressure ulcer 02/12/2015  . Antineoplastic chemotherapy induced anemia 01/23/2015  . Encounter for antineoplastic chemotherapy 12/18/2014  . Sacral decubitus ulcer 09/28/2014  . Cellulitis of groin 09/28/2014  . Dehydration 09/28/2014  . Renal failure (ARF), acute on chronic (HCC) 09/28/2014  . Fracture of left humerus 09/28/2014  . Fungal infection of the groin 09/28/2014  . Fall at home 09/28/2014  . Failure to thrive in adult 09/28/2014  . Bilateral lower extremity edema 06/04/2014  . Lung mass 01/22/2012  . Non-small cell carcinoma of lung, stage 3 ([place]) 01/21/2012  . Abdominal aortic aneurysm ([place]) 09/29/2011  Palliative Care Assessment & Plan   Patient Profile: 83 y.o. female  with past medical history of AAA, osteoarthritis, CVA, chronic fatigue, type 2 diabetes, GERD, hyperlipidemia, hypertension, non-small cell lung cancer status post chemotherapy  and radiotherapy admitted on 12/21/2016 with COPD exacerbation and uncontrolled pain.  Palliative consulted for goals of care and pain management recommendations.   Recommendations/Plan:  Pain: Improved significantly today. She received one dose of 4 mg IV morphine (which she thinks broke her pain cycle) followed by 2 additional doses of 2 mg of IV morphine overnight. I think it is reasonable to transition her back to oral regimen today. I would consider leaving IV rescue medication on board in case she needs it in the event that oral route is ineffective.  Goals of Care and Additional Recommendations:  Limitations on Scope of Treatment: Full Scope Treatment. We discussed how to develop plan of care to focus on continuing therapies that would maximize chance of being well enough to return home and limiting therapies not in line with this goal. She reports needing to discuss this as well as her CODE STATUS with family prior to making any further decisions.  Code Status:    Code Status Orders        Start     Ordered   12/21/16 1320  Full code  Continuous     12/21/16 1319    Code Status History    Date Active Date Inactive Code Status Order ID Comments User Context   02/12/2015  5:01 PM 02/14/2015  8:39 PM Full Code [id number]  Donne Hazel, MD ED   09/28/2014  8:42 AM 10/02/2014 12:42 AM Full Code [id number]  Modena Jansky, MD Inpatient       Prognosis:   Unable to determine  Discharge Planning:  To Be Determined  Care plan was discussed with RN, Dr. Venetia Constable  Thank you for allowing the Palliative Medicine Team to assist in the care of this patient.  Total time: 30 minutes    Greater than 50%  of this time was spent counseling and coordinating care related to the above assessment and plan.  Micheline Rough, MD  Please contact Palliative Medicine Team phone at [phone] for questions and concerns.

## 2016-12-23 NOTE — Progress Notes (Signed)
[facility] PROGRESS NOTE    Progress Note  Beverly Lewis  FYB:[id number] DOB: 07-09-1928 DOA: 12/21/2016 PCP: Fleet Contras, MD     Brief Narrative:   Beverly Lewis is an 83 y.o. female past medical history significant for AAA, CVA, diabetes mellitus type 2, history of lung cancer status post chemotherapy and radiation therapy was comes to the emergency department with left flank pain pleuritic chest pain and cough for about a week productive prior to admission.  Assessment/Plan:   Acute on chronic respiratory failure with hypoxia due to COPD exacerbation (HCC) CT image of the chest was done 12/21/2016 that showed progression of his lung carcinoma with a large right lower lobe paratracheal lymph nodes and progression of the left lower lobe tumor and bilateral adrenal masses She was started on IV steroids, her wheezing is resolved, change to oral steroids. antibiotics and inhalers.  Metastatic Non-small cell carcinoma of lung, stage 3 (HCC) CT angios chest showed progression of his lung carcinoma on the right with bilateral metastatic adrenal masses. Patient is requiring significant IV and oral analgesics left-sided pain likely due to metastatic disease. Radiation oncology was consulted and the patient is going for simulation on 12/24/2013. Plan of care has been consulted for end-of-life. His oncologist had a lengthy discussion with the patient and grandson and the progression of the disease. At this time when he met with his oncologist and was given the option of referral to hospice.  Diabetes mellitus type 2 with peripheral vascular disease: Continue Januvia plus sliding scale.  Essential hypertension: Hydrochlorothiazide has been on hold. BP seems well controlled.  Antineoplastic chemotherapy induced anemia Seems to be stable.  GERD without esophagitis: Continue Protonix.  Hyperkalemia: I encouraged patient drink fluids, was given her a liter of normal  saline and recheck a basic metabolic panel in the morning. She is in no supple mentation, she is unremarkable she has been this at home for some time. Recheck a basic metabolic panel.  Chronic gout of multiple sites Chronic gout in multiple sites continue allopurinol.  Dyslipidemia: DC Lipitor, patient will get no benefit from this medication.    DVT prophylaxis: lovenox Family Communication:none Disposition Plan/Barrier to D/C: home in 2 days Code Status:     Code Status Orders        Start     Ordered   12/21/16 1320  Full code  Continuous     12/21/16 1319    Code Status History    Date Active Date Inactive Code Status Order ID Comments User Context   02/12/2015  5:01 PM 02/14/2015  8:39 PM Full Code [id number]  Jerald Kief, MD ED   09/28/2014  8:42 AM 10/02/2014 12:42 AM Full Code [id number]  Elease Etienne, MD Inpatient        IV Access:    Peripheral IV   Procedures and diagnostic studies:   Ct Angio Chest Pe W And/or Wo Contrast  Result Date: 12/21/2016 CLINICAL DATA:  Chest pain, left-sided chest wall pain and history of lung carcinoma. EXAM: CT ANGIOGRAPHY CHEST WITH CONTRAST TECHNIQUE: Multidetector CT imaging of the chest was performed using the standard protocol during bolus administration of intravenous contrast. Multiplanar CT image reconstructions and MIPs were obtained to evaluate the vascular anatomy. CONTRAST:  100 mL Isovue 370 IV COMPARISON:  CT of the chest without contrast on 10/06/2016 as well as multiple prior additional studies. FINDINGS: Cardiovascular: The pulmonary arteries are well opacified and demonstrate no evidence of pulmonary embolism. Stable  heart size and calcified coronary artery plaque in the distribution of the LAD and left circumflex coronary arteries. The thoracic aorta demonstrates stable atherosclerosis with irregular calcified and noncalcified plaque present, some of which appears ulcerated. Stable mild aneurysmal dilatation of  the ascending thoracic aorta measures approximately 4.3 cm. Stable thrombosed penetrating ulcer creating 2.5 cm aneurysmal outpouching along the superior aspect of the proximal descending thoracic aorta along the posterior aspect of the aortic arch. No pericardial effusion identified. Mediastinum/Nodes: Interval enlargement of right lower paratracheal lymph node measuring 1.6 cm in short axis compared to approximately 1.3 cm previously. Lungs/Pleura: There is progression of malignancy since prior imaging. Disease in the right upper lobe appears fairly stable. Confluent masses in the posterior perihilar region of the right lung extending into the right lower lobe are more prominent with maximal conglomerate transverse diameter of tumor measuring approximately 4 x 7.5 cm. Spiculated nodule in the left upper lobe on image number 27 appears fairly stable and measures approximately 1.7 cm. Nodule in the superior segment of the left lower lobe on image number 30 measures 8 mm and is fairly stable. There is progression of tumor in the posterior and inferior left lower lobe with merging of separate areas of tumor seen previously into a more confluent area of tumor now measuring approximately 5 cm in greatest diameter. Irregular areas of lobulated tumor at the right lung base appear fairly stable and measure approximately 8.3 cm in greatest transverse diameter. Upper Abdomen: Progression of bilateral adrenal metastases with left adrenal metastasis now measuring 3.6 x 4.5 cm compared to approximately 3.3 cm in maximal diameter previously and right adrenal metastasis measuring 2.6 x 3.0 cm compared to 2 cm previously. Musculoskeletal: No chest wall abnormality. No acute or significant osseous findings. Review of the MIP images confirms the above findings. IMPRESSION: 1. No evidence of acute pulmonary embolism. 2. Progression of lung carcinoma with enlarged right lower paratracheal lymph node and progression of left lower lobe  tumor and bilateral adrenal metastases. Malignancy in the right lung and left upper lung appears relatively stable. 3. 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 Electronically Signed   By: Aletta Edouard M.D.   On: 12/21/2016 09:14     Medical Consultants:    None.  Anti-Infectives:   Unasyn  Subjective:    Hulan Fray that she has some left shoulder pain.  Objective:    Vitals:   12/22/16 2159 12/22/16 2322 12/23/16 0229 12/23/16 0251  BP: (!) 146/75   (!) 148/92  Pulse: 85   76  Resp: 18     Temp: 98.5 F (36.9 C)   98.2 F (36.8 C)  TempSrc: Oral   Oral  SpO2: 96% 97% 97% 99%  Weight:      Height:        Intake/Output Summary (Last 24 hours) at 12/23/16 0733 Last data filed at 12/22/16 1200  Gross per 24 hour  Intake              220 ml  Output                0 ml  Net              220 ml   Filed Weights   12/21/16 1837  Weight: 89.5 kg (197 lb 4.8 oz)    Exam: General exam: In no acute distress Respiratory system: Good air movement and clear  to auscultation. Cardiovascular system: Regular rate and rhythm with positive S1-S2 no murmurs rubs gallops Gastrointestinal system: Abdomen is soft, nontender nondistended.  Central nervous system: She is awake alert and oriented 3 nonfocal Extremities: No edema Skin: No rashes or ulceration. Psychiatry: Mood and affect appear appropriate, judgment and insight are normal.   Data Reviewed:    Labs: Basic Metabolic Panel:  Recent Labs Lab 12/21/16 0558 12/21/16 0610 12/22/16 0639 12/23/16 0558  NA 140 141 136 137  K 4.7 4.7 5.1 5.3*  CL 107 106 104 101  CO2 25  --  25 28  GLUCOSE 84 81 97 132*  BUN 24* 24* 19 22*  CREATININE 1.13* 1.10* 0.97 1.12*  CALCIUM 8.7*  --  8.7* 9.0  MG  --   --   --  2.0   GFR Estimated Creatinine Clearance: 39 mL/min (A) (by C-G formula based on SCr of 1.12  mg/dL (H)). Liver Function Tests:  Recent Labs Lab 12/21/16 0558 12/22/16 0639  AST 14* 13*  ALT 10* 9*  ALKPHOS 149* 141*  BILITOT 0.5 0.4  PROT 7.3 7.3  ALBUMIN 3.0* 3.1*   No results for input(s): LIPASE, AMYLASE in the last 168 hours. No results for input(s): AMMONIA in the last 168 hours. Coagulation profile No results for input(s): INR, PROTIME in the last 168 hours.  CBC:  Recent Labs Lab 12/21/16 0558 12/21/16 0610 12/22/16 0639 12/23/16 0558  WBC 6.7  --  7.3 8.1  NEUTROABS 4.1  --  5.2  --   HGB 12.2 13.9 11.5* 11.8*  HCT 38.5 41.0 37.1 37.7  MCV 98.2  --  97.1 97.4  PLT 269  --  272 268   Cardiac Enzymes: No results for input(s): CKTOTAL, CKMB, CKMBINDEX, TROPONINI in the last 168 hours. BNP (last 3 results) No results for input(s): PROBNP in the last 8760 hours. CBG:  Recent Labs Lab 12/22/16 0745 12/22/16 1135 12/22/16 1638 12/22/16 2201 12/23/16 0727  GLUCAP 89 158* 145* 150* 145*   D-Dimer: No results for input(s): DDIMER in the last 72 hours. Hgb A1c: No results for input(s): HGBA1C in the last 72 hours. Lipid Profile: No results for input(s): CHOL, HDL, LDLCALC, TRIG, CHOLHDL, LDLDIRECT in the last 72 hours. Thyroid function studies: No results for input(s): TSH, T4TOTAL, T3FREE, THYROIDAB in the last 72 hours.  Invalid input(s): FREET3 Anemia work up: No results for input(s): VITAMINB12, FOLATE, FERRITIN, TIBC, IRON, RETICCTPCT in the last 72 hours. Sepsis Labs:  Recent Labs Lab 12/21/16 0558 12/22/16 0639 12/23/16 0558  WBC 6.7 7.3 8.1   Microbiology No results found for this or any previous visit (from the past 240 hour(s)).   Medications:   . allopurinol  100 mg Oral Daily  . amoxicillin-clavulanate  1 tablet Oral Q12H  . aspirin EC  81 mg Oral Daily  . atorvastatin  20 mg Oral Daily  . enoxaparin (LOVENOX) injection  40 mg Subcutaneous Q24H  . gabapentin  300 mg Oral TID  . guaiFENesin  600 mg Oral BID  . insulin  aspart  0-15 Units Subcutaneous TID WC  . insulin aspart  0-5 Units Subcutaneous QHS  . ipratropium-albuterol  3 mL Nebulization Q6H  . irbesartan  150 mg Oral Daily  . linagliptin  5 mg Oral Daily  . methylPREDNISolone (SOLU-MEDROL) injection  80 mg Intravenous Q12H  . pantoprazole  40 mg Oral Daily   Continuous Infusions: . sodium chloride        LOS: 1 day  Charlynne Cousins  [facility] Pager [phone]  *Please refer to [email or web address], password [email or web address] to get updated schedule on who will round on this patient, as hospitalists switch teams weekly. If 7PM-7AM, please contact night-coverage at [email or web address], password [email or web address] for any overnight needs.  12/23/2016, 7:33 AM

## 2016-12-23 NOTE — Progress Notes (Signed)
Patient medications were inventoried and taken to pharmacy. Paper form placed in chart.

## 2016-12-23 NOTE — Progress Notes (Addendum)
CSW met with patient at bedside, explain role and reason for visit-Rehab Placement. Patient reports she does not want to go to rehab, she has been to a facility in the past and did not have a good experience. Patient reports she has aide, that comes to her home in the evening to assist with care. Patient reports previously she had Home Health. Patient is agreeable to restart the services. CSW inform RNCM.  Please re-consult if patient changes her mind.   Kathrin Greathouse, Latanya Presser, MSW Clinical Social Worker 5E and Psychiatric Service Line [phone] 12/23/2016  10:40 AM

## 2016-12-24 DIAGNOSIS — T402X5A Adverse effect of other opioids, initial encounter: Secondary | ICD-10-CM

## 2016-12-24 DIAGNOSIS — K5903 Drug induced constipation: Secondary | ICD-10-CM

## 2016-12-24 LAB — BASIC METABOLIC PANEL
Anion gap: 9 (ref 5–15)
BUN: 31 mg/dL — ABNORMAL HIGH (ref 6–20)
CALCIUM: 9 mg/dL (ref 8.9–10.3)
CO2: 26 mmol/L (ref 22–32)
CREATININE: 1.07 mg/dL — AB (ref 0.44–1.00)
Chloride: 100 mmol/L — ABNORMAL LOW (ref 101–111)
GFR calc non Af Amer: 46 mL/min — ABNORMAL LOW (ref >60)
GFR, EST AFRICAN AMERICAN: 53 mL/min — AB (ref >60)
Glucose, Bld: 94 mg/dL (ref 65–99)
Potassium: 5.5 mmol/L — ABNORMAL HIGH (ref 3.5–5.1)
SODIUM: 135 mmol/L (ref 135–145)

## 2016-12-24 LAB — GLUCOSE, CAPILLARY
GLUCOSE-CAPILLARY: 93 mg/dL (ref 65–99)
GLUCOSE-CAPILLARY: 88 mg/dL (ref 65–99)
Glucose-Capillary: 144 mg/dL — ABNORMAL HIGH (ref 65–99)
Glucose-Capillary: 146 mg/dL — ABNORMAL HIGH (ref 65–99)

## 2016-12-24 MED ORDER — POLYETHYLENE GLYCOL 3350 17 G PO PACK
17.0000 g | PACK | Freq: Every day | ORAL | Status: DC
  Filled 2016-12-24: qty 1

## 2016-12-24 MED ORDER — SODIUM POLYSTYRENE SULFONATE 15 GM/60ML PO SUSP
30.0000 g | Freq: Once | ORAL | Status: AC
  Administered 2016-12-24: 30 g via ORAL
  Filled 2016-12-24: qty 120

## 2016-12-24 MED ORDER — OXYCODONE-ACETAMINOPHEN 5-325 MG PO TABS
1.0000 | ORAL_TABLET | ORAL | Status: DC | PRN
  Administered 2016-12-24 (×2): 1 via ORAL
  Administered 2016-12-25: 2 via ORAL
  Filled 2016-12-24: qty 1
  Filled 2016-12-24: qty 2
  Filled 2016-12-24: qty 1

## 2016-12-24 MED ORDER — POLYETHYLENE GLYCOL 3350 17 G PO PACK: 17.0000 g | PACK | Freq: Every day | ORAL | Status: DC | PRN

## 2016-12-24 NOTE — Progress Notes (Addendum)
Daily Progress Note   Patient Name: Beverly Lewis       Date: 12/24/2016 DOB: [birth year]  Age: 83 y.o. MRN#: [id number] Attending Physician: Charlynne Cousins, MD Primary Care Physician: Nolene Ebbs, MD Admit Date: 12/21/2016  Reason for Consultation/Follow-up: Establishing goals of care and Pain control  Subjective: Met with Beverly Lewis today.  She reports that her pain worsened again overnight.  She states that her pain improves after getting 4 mg IV morphine, but current dose of norco does not seem to be effective.  She reports 2-3 days since last BM.  States she takes "beige pill" at home for this and her also used Miralax in the past with good results.  Discussed opioid related constipation and need to increase bowel regimen with increased pain medication usage.  Length of Stay: 2  Current Medications: Scheduled Meds:  . allopurinol  100 mg Oral Daily  . amoxicillin-clavulanate  1 tablet Oral Q12H  . aspirin EC  81 mg Oral Daily  . atorvastatin  20 mg Oral Daily  . enoxaparin (LOVENOX) injection  40 mg Subcutaneous Q24H  . gabapentin  300 mg Oral TID  . guaiFENesin  600 mg Oral BID  . insulin aspart  0-15 Units Subcutaneous TID WC  . insulin aspart  0-5 Units Subcutaneous QHS  . ipratropium-albuterol  3 mL Nebulization Q6H  . linagliptin  5 mg Oral Daily  . pantoprazole  40 mg Oral Daily  . polyethylene glycol  17 g Oral Daily  . predniSONE  50 mg Oral Q breakfast  . sodium polystyrene  30 g Oral Once    Continuous Infusions:   PRN Meds: albuterol, guaiFENesin-dextromethorphan, morphine injection, ondansetron **OR** ondansetron (ZOFRAN) IV, oxyCODONE-acetaminophen  Physical Exam         General: Alert, awake, in no distress   HEENT: No bruits, no goiter,  no JVD Heart: Regular rate and rhythm. No murmur appreciated. Lungs: Improved air movement, no significant wheeze.  No crackles appreciated. Abdomen: Soft, nontender, nondistended, positive bowel sounds.  Ext: No significant edema Skin: Warm and dry Neuro: Grossly intact, nonfocal.  Vital Signs: BP 133/70 (BP Location: Right Arm)   Pulse 88   Temp 98.2 F (36.8 C) (Oral)   Resp (!) 22   Ht _0  (1.6 m)   Wt 89.5 kg (  197 lb 4.8 oz)   SpO2 92%   BMI 34.95 kg/m  SpO2: SpO2: 92 % O2 Device: O2 Device: Nasal Cannula O2 Flow Rate: O2 Flow Rate (L/min): 4 L/min  Intake/output summary:   Intake/Output Summary (Last 24 hours) at 12/24/16 2440 Last data filed at 12/24/16 0140  Gross per 24 hour  Intake              600 ml  Output             2475 ml  Net            -1875 ml   LBM: Last BM Date: 12/20/16 Baseline Weight: Weight: 89.5 kg (197 lb 4.8 oz) Most recent weight: Weight: 89.5 kg (197 lb 4.8 oz)       Palliative Assessment/Data:    Flowsheet Rows     Most Recent Value  Intake Tab  Referral Department  Hospitalist  Unit at Time of Referral  Med/Surg Unit  Palliative Care Primary Diagnosis  Cancer  Date Notified  12/22/16  Palliative Care Type  New Palliative care  Reason for referral  Clarify Goals of Care, Pain  Date of Admission  12/21/16  Date first seen by Palliative Care  12/22/16  # of days Palliative referral response time  0 Day(s)  # of days IP prior to Palliative referral  1  Clinical Assessment  Palliative Performance Scale Score  50%  Pain Max last 24 hours  10  Pain Min Last 24 hours  8  Psychosocial & Spiritual Assessment  Palliative Care Outcomes  Patient/Family meeting held?  Yes  Who was at the meeting?  Patient  Palliative Care Outcomes  Improved pain interventions      Patient Active Problem List   Diagnosis Date Noted  . Acute on chronic respiratory failure with hypoxia ([place]) 12/23/2016  . COPD exacerbation ([place]) 12/21/2016  .  Chronic fatigue 12/15/2016  . PAD (peripheral artery disease) ([place]) 10/01/2015  . Essential hypertension, benign 02/18/2015  . GERD without esophagitis 02/18/2015  . Hypokalemia 02/18/2015  . Chronic gout of multiple sites 02/18/2015  . Dyslipidemia 02/18/2015  . Type II diabetes mellitus with peripheral circulatory disorder ([place]) 02/18/2015  . Morbid obesity ([place]) 02/13/2015  . Erythema multiforme bullosum ([place]) 02/13/2015  . Anemia 02/12/2015  . Blister of lower leg 02/12/2015  . Pressure ulcer 02/12/2015  . Antineoplastic chemotherapy induced anemia 01/23/2015  . Encounter for antineoplastic chemotherapy 12/18/2014  . Sacral decubitus ulcer 09/28/2014  . Cellulitis of groin 09/28/2014  . Dehydration 09/28/2014  . Renal failure (ARF), acute on chronic (HCC) 09/28/2014  . Fracture of left humerus 09/28/2014  . Fungal infection of the groin 09/28/2014  . Fall at home 09/28/2014  . Failure to thrive in adult 09/28/2014  . Bilateral lower extremity edema 06/04/2014  . Lung mass 01/22/2012  . Non-small cell carcinoma of lung, stage 3 ([place]) 01/21/2012  . Abdominal aortic aneurysm ([place]) 09/29/2011    Palliative Care Assessment & Plan   Patient Profile: 83 y.o. female  with past medical history of AAA, osteoarthritis, CVA, chronic fatigue, type 2 diabetes, GERD, hyperlipidemia, hypertension, non-small cell lung cancer status post chemotherapy and radiotherapy admitted on 12/21/2016 with COPD exacerbation and uncontrolled pain.  Palliative consulted for goals of care and pain management recommendations.   Recommendations/Plan:  Pain: Reports worsened again overnight. She received 2 doses of 4 mg IV morphine overnight which she states controls her pain.  She does not think that her current oral  regimen (Norco) is helpful to her. Discussed with Dr. Venetia Constable and will therefore plan to transition to oxycodone/APAP 5/325 1-2 tabs Q4H as needed.  This will effectively increase her opioid equivalent  to dose similar to rescue medication that has been effective (morphine 19m IV).  Could also consider use of MSIR, but I think it will be safer to utilize Percocet as it allows her to maintain current regimen of 1-2 pills every 4 hours as needed.   Constipation: 2 days since last BM and increased opioids.  Note plan for kayexalate today due to hyperkalemia.  Reports miralax works well for her at home and will therefore start prn miralax to be used moving forward for opioid related constipation.  Discussed need to continue this on discharge.  Goals of Care and Additional Recommendations:  Limitations on Scope of Treatment: Full Scope Treatment. We discussed how to develop plan of care to focus on continuing therapies that would maximize chance of being well enough to return home and limiting therapies not in line with this goal. She reports needing to discuss this as well as her CODE STATUS with family prior to making any further decisions.  Code Status:    Code Status Orders        Start     Ordered   12/21/16 1320  Full code  Continuous     12/21/16 1319    Code Status History    Date Active Date Inactive Code Status Order ID Comments User Context   02/12/2015  5:01 PM 02/14/2015  8:39 PM Full Code [id number] CDonne Hazel MD ED   09/28/2014  8:42 AM 10/02/2014 12:42 AM Full Code [id number] HModena Jansky MD Inpatient       Prognosis:   Unable to determine as plan for immunotherapy.  If she would decide to forgo further disease modifying therapy at any point in the future, I believe her prognosis at that point would be less than 6 months and she should qualify for hospice services if so desired.  Discharge Planning:  [place] Dr. FVenetia Constable Thank you for allowing the Palliative Medicine Team to assist in the care of this patient.  Time in: 0850 Time out: 0915 Total time: 25 minutes    Greater than 50%  of this time was spent  counseling and coordinating care related to the above assessment and plan.  GMicheline Rough MD  Please contact Palliative Medicine Team phone at [phone] questions and concerns.

## 2016-12-24 NOTE — Progress Notes (Signed)
Physical Therapy Treatment Patient Details Name: KATARYNA MCQUILKIN MRN: [id number] DOB: 27-Oct-1928 Today's Date: 12/24/2016    History of Present Illness 83 yo female admitted with COPD exac, L flank pain. Hx of lung cancer, DM, HTN, AAA, CVA, obesity, PVD, gout.     PT Comments    Assisted pt OOB to [facility] then to recliner all 1/4 turn towards her R "stronger side" while using forward momentum to stand and arm rests on both BSC and recliner to steady self.  Pt admits she sleeps in her lift chair but is able to self rise to [facility] and can walk "some".     Follow Up Recommendations  SNF (if pt will agree if not [facility])     Equipment Recommendations       Recommendations for Other Services       Precautions / Restrictions Precautions Precautions: Fall Precaution Comments: sleeps in her lift chair and COPD Restrictions Weight Bearing Restrictions: No    Mobility  Bed Mobility Overal bed mobility: Needs Assistance Bed Mobility: Rolling;Supine to Sit Rolling: Max assist   Supine to sit: Max assist;HOB elevated     General bed mobility comments: used bed pad to complete scooting to EOB  Transfers Overall transfer level: Needs assistance Equipment used: None Transfers: Sit to/from [facility] Sit to Stand: Min guard;Min assist Stand pivot transfers: Min guard;Min assist       General transfer comment: pt does NOT like using a walker to perform transfers.  "It moves".  Pt prefers to used locked down furniture and uses forward mometum to rise.  Pt able to transfer from elevated bed to [facility] to recliner completing 1/4 pivot turn while holding to arm rest.  Did assist pt with hygiene after a void.    Ambulation/Gait             General Gait Details: transfers only this session however pt staed she can walk short distances.    Stairs            Wheelchair Mobility    Modified Rankin (Stroke Patients Only)       Balance                                             Cognition Arousal/Alertness: Awake/alert Behavior During Therapy: WFL for tasks assessed/performed Overall Cognitive Status: Within Functional Limits for tasks assessed                                 General Comments: familiar from prior admission      Exercises      General Comments        Pertinent Vitals/Pain Pain Assessment: 0-10 Pain Score: 5  Pain Location: L back, shoulder blade area Pain Descriptors / Indicators: Sharp;Sore;Discomfort;Aching Pain Intervention(s): Premedicated before session;Repositioned    Home Living                      Prior Function            PT Goals (current goals can now be found in the care plan section) Progress towards PT goals: Progressing toward goals    Frequency    Min 3X/week      PT Plan Current plan remains appropriate    Co-evaluation  AM-PAC PT "6 Clicks" Daily Activity  Outcome Measure  Difficulty turning over in bed (including adjusting bedclothes, sheets and blankets)?: Total Difficulty moving from lying on back to sitting on the side of the bed? : Total Difficulty sitting down on and standing up from a chair with arms (e.g., wheelchair, bedside commode, etc,.)?: Total Help needed moving to and from a bed to chair (including a wheelchair)?: A Lot Help needed walking in hospital room?: A Lot Help needed climbing 3-5 steps with a railing? : Total 6 Click Score: 8    End of Session Equipment Utilized During Treatment: Gait belt Activity Tolerance: Patient limited by fatigue;Patient limited by pain Patient left: in chair Nurse Communication: Mobility status;Need for lift equipment PT Visit Diagnosis: Muscle weakness (generalized) (M62.81);Difficulty in walking, not elsewhere classified (R26.2)     Time: 1438-8875 PT Time Calculation (min) (ACUTE ONLY): 25 min  Charges:  $Therapeutic Activity: 23-37 mins                    G  Codes:       Rica Koyanagi  PTA [facility]  Acute  Rehab Pager      [phone]

## 2016-12-24 NOTE — Progress Notes (Signed)
[facility] PROGRESS NOTE    Progress Note  BOBETTE LEYH  TKZ:[id number] DOB: [birth year] DOA: 12/21/2016 PCP: Nolene Ebbs, MD     Brief Narrative:   Beverly Lewis is an 83 y.o. female past medical history significant for AAA, CVA, diabetes mellitus type 2, history of lung cancer status post chemotherapy and radiation therapy was comes to the emergency department with left flank pain pleuritic chest pain and cough for about a week productive prior to admission.  Assessment/Plan:   Acute on chronic respiratory failure with hypoxia due to COPD exacerbation (HCC) - CT image of the chest was done 12/21/2016 that showed progression of his lung carcinoma with a large right lower lobe paratracheal lymph nodes and progression of the left lower lobe tumor and bilateral adrenal masses. - cont oral steroids. antibiotics and inhalers.  Metastatic Non-small cell carcinoma of lung, stage 3 (HCC) - CT angios chest showed progression of his lung carcinoma on the right with bilateral metastatic adrenal masses. - Continue IV and oral analgesic to her left-sided pain.  - Appriciate oncologist assistance still recommended to change Norco to Percocet.  - Radiation oncology was consulted for simulation 12/24/2013. - His oncologist had a lengthy discussion with the patient and grandson and the progression of the disease.   Diabetes mellitus type 2 with peripheral vascular disease: Continue Januvia plus sliding scale.  Essential hypertension: Hydrochlorothiazide has been on hold. BP seems well controlled.  Antineoplastic chemotherapy induced anemia Seems to be stable.  GERD without esophagitis: Continue Protonix.  Hyperkalemia: She is on no medications at cause her potassium to increase, we'll give her Kayexalate as despite hydration her potassium continues to increase. She is in no supplementation.  Chronic gout of multiple sites Chronic gout in multiple sites continue  allopurinol.  Dyslipidemia: DC Lipitor, patient will get no benefit from this medication.    DVT prophylaxis: lovenox Family Communication:none Disposition Plan/Barrier to D/C: home in 7.6.2018 Code Status:     Code Status Orders        Start     Ordered   12/21/16 1320  Full code  Continuous     12/21/16 1319    Code Status History    Date Active Date Inactive Code Status Order ID Comments User Context   02/12/2015  5:01 PM 02/14/2015  8:39 PM Full Code [id number]  Donne Hazel, MD ED   09/28/2014  8:42 AM 10/02/2014 12:42 AM Full Code [id number]  Modena Jansky, MD Inpatient        IV Access:    Peripheral IV   Procedures and diagnostic studies:   No results found.   Medical Consultants:    None.  Anti-Infectives:  Augmentin  Subjective:    Maxie Barb relates her left shoulder pain is still bothering her.  Objective:    Vitals:   12/23/16 2040 12/23/16 2256 12/24/16 0247 12/24/16 0441  BP: 138/61   133/70  Pulse: 75   88  Resp: 20   (!) 22  Temp: 98.8 F (37.1 C)   98.2 F (36.8 C)  TempSrc: Oral   Oral  SpO2: 98% 97% 97% 97%  Weight:      Height:        Intake/Output Summary (Last 24 hours) at 12/24/16 0723 Last data filed at 12/24/16 0140  Gross per 24 hour  Intake              600 ml  Output  2475 ml  Net            -1875 ml   Filed Weights   12/21/16 1837  Weight: 89.5 kg (197 lb 4.8 oz)    Exam: General exam:Hear no acute distress  Respiratory system:Good air movement clear to auscultation.  Cardiovascular system:Regular rate and rhythm with positive S1-S2  Gastrointestinal system:Abdomen is soft nontender nondistended  Central nervous system:Awake alert and oriented 3  Extremities:No edema  Skin:No rashes  Psychiatry: Mood and affect are appropriate.  Data Reviewed:    Labs: Basic Metabolic Panel:  Recent Labs Lab 12/21/16 0558 12/21/16 0610 12/22/16 0639 12/23/16 0558 12/24/16 0518    NA 140 141 136 137 135  K 4.7 4.7 5.1 5.3* 5.5*  CL 107 106 104 101 100*  CO2 25  --  25 28 26   GLUCOSE 84 81 97 132* 94  BUN 24* 24* 19 22* 31*  CREATININE 1.13* 1.10* 0.97 1.12* 1.07*  CALCIUM 8.7*  --  8.7* 9.0 9.0  MG  --   --   --  2.0  --    GFR Estimated Creatinine Clearance: 40.8 mL/min (A) (by C-G formula based on SCr of 1.07 mg/dL (H)). Liver Function Tests:  Recent Labs Lab 12/21/16 0558 12/22/16 0639  AST 14* 13*  ALT 10* 9*  ALKPHOS 149* 141*  BILITOT 0.5 0.4  PROT 7.3 7.3  ALBUMIN 3.0* 3.1*   No results for input(s): LIPASE, AMYLASE in the last 168 hours. No results for input(s): AMMONIA in the last 168 hours. Coagulation profile No results for input(s): INR, PROTIME in the last 168 hours.  CBC:  Recent Labs Lab 12/21/16 0558 12/21/16 0610 12/22/16 0639 12/23/16 0558  WBC 6.7  --  7.3 8.1  NEUTROABS 4.1  --  5.2  --   HGB 12.2 13.9 11.5* 11.8*  HCT 38.5 41.0 37.1 37.7  MCV 98.2  --  97.1 97.4  PLT 269  --  272 268   Cardiac Enzymes: No results for input(s): CKTOTAL, CKMB, CKMBINDEX, TROPONINI in the last 168 hours. BNP (last 3 results) No results for input(s): PROBNP in the last 8760 hours. CBG:  Recent Labs Lab 12/22/16 2201 12/23/16 0727 12/23/16 1223 12/23/16 1720 12/23/16 2222  GLUCAP 150* 145* 112* 137* 140*   D-Dimer: No results for input(s): DDIMER in the last 72 hours. Hgb A1c: No results for input(s): HGBA1C in the last 72 hours. Lipid Profile: No results for input(s): CHOL, HDL, LDLCALC, TRIG, CHOLHDL, LDLDIRECT in the last 72 hours. Thyroid function studies: No results for input(s): TSH, T4TOTAL, T3FREE, THYROIDAB in the last 72 hours.  Invalid input(s): FREET3 Anemia work up: No results for input(s): VITAMINB12, FOLATE, FERRITIN, TIBC, IRON, RETICCTPCT in the last 72 hours. Sepsis Labs:  Recent Labs Lab 12/21/16 0558 12/22/16 0639 12/23/16 0558  WBC 6.7 7.3 8.1   Microbiology No results found for this or any  previous visit (from the past 240 hour(s)).   Medications:   . allopurinol  100 mg Oral Daily  . amoxicillin-clavulanate  1 tablet Oral Q12H  . aspirin EC  81 mg Oral Daily  . atorvastatin  20 mg Oral Daily  . enoxaparin (LOVENOX) injection  40 mg Subcutaneous Q24H  . gabapentin  300 mg Oral TID  . guaiFENesin  600 mg Oral BID  . insulin aspart  0-15 Units Subcutaneous TID WC  . insulin aspart  0-5 Units Subcutaneous QHS  . ipratropium-albuterol  3 mL Nebulization Q6H  . irbesartan  150 mg Oral Daily  . linagliptin  5 mg Oral Daily  . pantoprazole  40 mg Oral Daily  . predniSONE  50 mg Oral Q breakfast   Continuous Infusions:     LOS: 2 days   Charlynne Cousins  [facility] Pager [phone]  *Please refer to [email or web address], password [email or web address] to get updated schedule on who will round on this patient, as hospitalists switch teams weekly. If 7PM-7AM, please contact night-coverage at [email or web address], password [email or web address] for any overnight needs.  12/24/2016, 7:23 AM

## 2016-12-25 DIAGNOSIS — E875 Hyperkalemia: Secondary | ICD-10-CM

## 2016-12-25 LAB — BASIC METABOLIC PANEL
Anion gap: 9 (ref 5–15)
BUN: 34 mg/dL — AB (ref 6–20)
CALCIUM: 8.9 mg/dL (ref 8.9–10.3)
CHLORIDE: 100 mmol/L — AB (ref 101–111)
CO2: 28 mmol/L (ref 22–32)
CREATININE: 1.12 mg/dL — AB (ref 0.44–1.00)
GFR calc non Af Amer: 44 mL/min — ABNORMAL LOW (ref >60)
GFR, EST AFRICAN AMERICAN: 50 mL/min — AB (ref >60)
GLUCOSE: 119 mg/dL — AB (ref 65–99)
Potassium: 5.2 mmol/L — ABNORMAL HIGH (ref 3.5–5.1)
Sodium: 137 mmol/L (ref 135–145)

## 2016-12-25 LAB — HEMOGLOBIN A1C
Hgb A1c MFr Bld: 5.3 % (ref 4.8–5.6)
Mean Plasma Glucose: 105 mg/dL

## 2016-12-25 LAB — GLUCOSE, CAPILLARY
Glucose-Capillary: 133 mg/dL — ABNORMAL HIGH (ref 65–99)
Glucose-Capillary: 107 mg/dL — ABNORMAL HIGH (ref 65–99)

## 2016-12-25 MED ORDER — OXYCODONE-ACETAMINOPHEN 5-325 MG PO TABS: 1.0000 | ORAL_TABLET | ORAL | 0 refills | Status: DC | PRN

## 2016-12-25 MED ORDER — ALBUTEROL SULFATE HFA 108 (90 BASE) MCG/ACT IN AERS: 1.0000 | INHALATION_SPRAY | RESPIRATORY_TRACT | 0 refills | Status: DC | PRN

## 2016-12-25 MED ORDER — AMOXICILLIN-POT CLAVULANATE 875-125 MG PO TABS: 1.0000 | ORAL_TABLET | Freq: Two times a day (BID) | ORAL | 0 refills | Status: AC

## 2016-12-25 MED ORDER — DOCUSATE SODIUM 100 MG PO CAPS: 100.0000 mg | ORAL_CAPSULE | Freq: Two times a day (BID) | ORAL | 0 refills | Status: AC

## 2016-12-25 MED ORDER — ALBUTEROL SULFATE HFA 108 (90 BASE) MCG/ACT IN AERS: 2.0000 | INHALATION_SPRAY | Freq: Four times a day (QID) | RESPIRATORY_TRACT | 2 refills | Status: AC | PRN

## 2016-12-25 MED ORDER — PREDNISONE 10 MG PO TABS: ORAL_TABLET | ORAL | 0 refills | Status: DC

## 2016-12-25 NOTE — Care Management Important Message (Signed)
Important Message  Patient Details  Name: Beverly Lewis MRN: [id number] Date of Birth: Apr 15, 1929   Medicare Important Message Given:  Yes    Erenest Rasher, RN 12/25/2016, 9:48 AM

## 2016-12-25 NOTE — Care Management Note (Signed)
Case Management Note  Patient Details  Name: Beverly Lewis MRN: [id number] Date of Birth: 12-10-1928  Subjective/Objective:        COPD exac, acute on chronic resp failure, Metastatic non-small cell carcinoma of lungs, DM, HTN            Action/Plan: Discharge Planning: NCM spoke to to pt and offered choice for [facility]. Pt requested [facility] for HH, but [place] unable to accept referral due to staffing, Contacted [facility] DME rep for oxygen and Rolling Walker with seat. Pt states her grand-sons and dtr live in the home and assist as needed. She had a hospital bed in the past but did not like bed. Has bedside commode.  Contacted [facility] for [facility] and they are able to accept referral.   PCP Nolene Ebbs MD  Expected Discharge Date:  12/25/16               Expected Discharge Plan:  [place]  In-House Referral:  NA  Discharge planning Services  CM Consult  Post Acute Care Choice:  Home Health Choice offered to:  Patient  DME Arranged:  Walker rolling with seat, Oxygen DME Agency:  [place] Arranged:  RN, PT, OT, Nurse's Aide [place] Agency:  [place]  Status of Service:  Completed, signed off  If discussed at [place] of Stay Meetings, dates discussed:    Additional Comments:  Erenest Rasher, RN 12/25/2016, 12:09 PM

## 2016-12-25 NOTE — Discharge Summary (Addendum)
Physician Discharge Summary  Beverly Lewis LZJ:[id number] DOB: [birth year] DOA: 12/21/2016  PCP: Nolene Ebbs, MD  Admit date: 12/21/2016 Discharge date: 12/25/2016  Admitted From: home Disposition:  SNF  Recommendations for Outpatient Follow-up:  1. Follow up with PCP in 1-2 weeks 2. Recheck blood pressure in 2 weeks resume antihypertensive medications if needed 3. Patient will go to skilled nursing facility. 4. Check a basic metabolic panel in 1 week.  Home Health:No Equipment/Devices:none  Discharge Condition:Guarded CODE STATUS:Full Diet recommendation: Heart Healthy   Brief/Interim Summary: 83 y.o. female past medical history significant for AAA, CVA, diabetes mellitus type 2, history of lung cancer status post chemotherapy and radiation therapy was comes to the emergency department with left flank pain pleuritic chest pain and cough for about a week productive prior to admission  Discharge Diagnoses:  Acute on chronic respiratory failure with hypoxia due to COPD exacerbation ([name]): CT scan of the chest on 12/21/2016 show progression of his lung cancer with large hiatal right lower lobe paratracheal lymph node and progression of left lower lobe tumor with metastases to the adrenals bilaterally. She was started on IV antibiotic and empiric steroids with improvement in his saturations. Once her saturations were improved, her regimen was changed to oral which she will continue as an outpatient.  Metastatic non-small cell carcinoma of the lung stage III: CT angiography of the chest show progression of her adenocarcinoma with metastatic bilateral adrenal mass and paratracheal lymph nodes. She was started on IV narcotics once her pain was controlled she was changed to oral Percocet which does help with the pain. Radiation oncology was consulted and she went for simulation and 12/24/2016. Her oncology had a lengthy discussion with the patient and grandson about the progression of her  disease as an outpatient. At this time she was given the option of hospice referral which she refused. [facility] met with her today and she would like to continue aggressive treatment. Dr. Julien Nordmann to address an outpatient.  Diabetes mellitus type 2 with peripheral vascular disease: No changes were made to her medication.  Essential hypertension: Her antihypertensive medications were held, her blood pressure has been stable.  She'll be reevaluated by her PCP in 2-4 weeks and resume antihypertensive medications as tolerated.  Antineoplastic chemotherapy-induced anemia: Seems to be stable.  GERD without esophagitis: Continue Protonix.  Hyperkalemia: Multifactorial likely due to ARB. [name] was held and she will be discharged off ARB. Will need a basic metabolic panel in 1 week.    Discharge Instructions  Discharge Instructions    Diet - low sodium heart healthy    Complete by:  As directed    Increase activity slowly    Complete by:  As directed      Allergies as of 12/25/2016      Reactions   Asa Buff (mag [buffered Aspirin]    In tolerance in higher dosages.      Medication List    STOP taking these medications   eprosartan 600 MG tablet Commonly known as:  TEVETEN   hydrochlorothiazide 25 MG tablet Commonly known as:  HYDRODIURIL   metoprolol tartrate 25 MG tablet Commonly known as:  LOPRESSOR     TAKE these medications   acetaminophen 500 MG tablet Commonly known as:  TYLENOL Take 500 mg by mouth every 6 (six) hours as needed for moderate pain (inflammation).   albuterol 108 (90 Base) MCG/ACT inhaler Commonly known as:  PROVENTIL HFA;VENTOLIN HFA Inhale 1-2 puffs into the lungs every 4 (four) hours  as needed for wheezing or shortness of breath.   albuterol 108 (90 Base) MCG/ACT inhaler Commonly known as:  PROVENTIL HFA;VENTOLIN HFA Inhale 2 puffs into the lungs every 6 (six) hours as needed for wheezing or shortness of breath.   allopurinol 100 MG  tablet Commonly known as:  ZYLOPRIM Take 100 mg by mouth daily.   amoxicillin-clavulanate 875-125 MG tablet Commonly known as:  AUGMENTIN Take 1 tablet by mouth every 12 (twelve) hours.   aspirin EC 81 MG tablet Take 81 mg by mouth daily.   atorvastatin 20 MG tablet Commonly known as:  LIPITOR Take 20 mg by mouth daily.   cholecalciferol 1000 units tablet Commonly known as:  VITAMIN D Take 1,000 Units by mouth daily.   docusate sodium 100 MG capsule Commonly known as:  COLACE Take 1 capsule (100 mg total) by mouth every 12 (twelve) hours.   gabapentin 300 MG capsule Commonly known as:  NEURONTIN Take 300 mg by mouth 3 (three) times daily. As directed   HYDROcodone-acetaminophen 5-325 MG tablet Commonly known as:  NORCO/VICODIN Take 1 tablet by mouth every 6 (six) hours as needed. What changed:  when to take this   JANUVIA 100 MG tablet Generic drug:  sitaGLIPtin Take 100 mg by mouth daily.   oxyCODONE-acetaminophen 5-325 MG tablet Commonly known as:  PERCOCET/ROXICET Take 1-2 tablets by mouth every 4 (four) hours as needed for moderate pain or severe pain.   pantoprazole 40 MG tablet Commonly known as:  PROTONIX Take 40 mg by mouth daily.   predniSONE 20 MG tablet Commonly known as:  DELTASONE Take 3 tablets (60 mg total) by mouth daily.   predniSONE 10 MG tablet Commonly known as:  DELTASONE Takes 6 tablets for 1 days, then 5 tablets for 1 days, then 4 tablets for 1 days, then 3 tablets for 1 days, then 2 tabs for 1 days, then 1 tab for 1 days, and then stop.      Follow-up Information    Schedule an appointment as soon as possible for a visit  with Nolene Ebbs, MD.   Specialty:  Internal Medicine Contact information: [address] [place] [address] [phone]          Allergies  Allergen Reactions  . Asa Buff (Mag [Buffered Aspirin]     In tolerance in higher dosages.    Consultations:  Radio Onc   Procedures/Studies: Ct  Angio Chest Pe W And/or Wo Contrast  Result Date: 12/21/2016 CLINICAL DATA:  Chest pain, left-sided chest wall pain and history of lung carcinoma. EXAM: CT ANGIOGRAPHY CHEST WITH CONTRAST TECHNIQUE: Multidetector CT imaging of the chest was performed using the standard protocol during bolus administration of intravenous contrast. Multiplanar CT image reconstructions and MIPs were obtained to evaluate the vascular anatomy. CONTRAST:  100 mL Isovue 370 IV COMPARISON:  CT of the chest without contrast on 10/06/2016 as well as multiple prior additional studies. FINDINGS: Cardiovascular: The pulmonary arteries are well opacified and demonstrate no evidence of pulmonary embolism. Stable heart size and calcified coronary artery plaque in the distribution of the LAD and left circumflex coronary arteries. The thoracic aorta demonstrates stable atherosclerosis with irregular calcified and noncalcified plaque present, some of which appears ulcerated. Stable mild aneurysmal dilatation of the ascending thoracic aorta measures approximately 4.3 cm. Stable thrombosed penetrating ulcer creating 2.5 cm aneurysmal outpouching along the superior aspect of the proximal descending thoracic aorta along the posterior aspect of the aortic arch. No pericardial effusion identified. Mediastinum/Nodes: Interval enlargement  of right lower paratracheal lymph node measuring 1.6 cm in short axis compared to approximately 1.3 cm previously. Lungs/Pleura: There is progression of malignancy since prior imaging. Disease in the right upper lobe appears fairly stable. Confluent masses in the posterior perihilar region of the right lung extending into the right lower lobe are more prominent with maximal conglomerate transverse diameter of tumor measuring approximately 4 x 7.5 cm. Spiculated nodule in the left upper lobe on image number 27 appears fairly stable and measures approximately 1.7 cm. Nodule in the superior segment of the left lower lobe on  image number 30 measures 8 mm and is fairly stable. There is progression of tumor in the posterior and inferior left lower lobe with merging of separate areas of tumor seen previously into a more confluent area of tumor now measuring approximately 5 cm in greatest diameter. Irregular areas of lobulated tumor at the right lung base appear fairly stable and measure approximately 8.3 cm in greatest transverse diameter. Upper Abdomen: Progression of bilateral adrenal metastases with left adrenal metastasis now measuring 3.6 x 4.5 cm compared to approximately 3.3 cm in maximal diameter previously and right adrenal metastasis measuring 2.6 x 3.0 cm compared to 2 cm previously. Musculoskeletal: No chest wall abnormality. No acute or significant osseous findings. Review of the MIP images confirms the above findings. IMPRESSION: 1. No evidence of acute pulmonary embolism. 2. Progression of lung carcinoma with enlarged right lower paratracheal lymph node and progression of left lower lobe tumor and bilateral adrenal metastases. Malignancy in the right lung and left upper lung appears relatively stable. 3. 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 Electronically Signed   By: Aletta Edouard M.D.   On: 12/21/2016 09:14   (Echo, Carotid, EGD, Colonoscopy, ERCP)    Subjective: She rates her pain is controlled.  Discharge Exam: Vitals:   12/25/16 0541 12/25/16 0700  BP:  123/68  Pulse: (!) 119   Resp: 18   Temp: 98.5 F (36.9 C)    Vitals:   12/25/16 0215 12/25/16 0541 12/25/16 0700 12/25/16 0753  BP:   123/68   Pulse:  (!) 119    Resp:  18    Temp:  98.5 F (36.9 C)    TempSrc:  Oral    SpO2: 95% 95%  94%  Weight:      Height:        General: Pt is alert, awake, not in acute distress Cardiovascular: RRR, S1/S2 +, no rubs, no gallops Respiratory: CTA bilaterally, no wheezing, no rhonchi Abdominal:  Soft, NT, ND, bowel sounds + Extremities: no edema, no cyanosis    The results of significant diagnostics from this hospitalization (including imaging, microbiology, ancillary and laboratory) are listed below for reference.     Microbiology: No results found for this or any previous visit (from the past 240 hour(s)).   Labs: BNP (last 3 results) No results for input(s): BNP in the last 8760 hours. Basic Metabolic Panel:  Recent Labs Lab 12/21/16 0558 12/21/16 0610 12/22/16 0639 12/23/16 0558 12/24/16 0518 12/25/16 0537  NA 140 141 136 137 135 137  K 4.7 4.7 5.1 5.3* 5.5* 5.2*  CL 107 106 104 101 100* 100*  CO2 25  --  '25 28 26 28  '$ GLUCOSE 84 81 97 132* 94 119*  BUN 24* 24* 19 22* 31* 34*  CREATININE 1.13* 1.10* 0.97 1.12* 1.07* 1.12*  CALCIUM 8.7*  --  8.7*  9.0 9.0 8.9  MG  --   --   --  2.0  --   --    Liver Function Tests:  Recent Labs Lab 12/21/16 0558 12/22/16 0639  AST 14* 13*  ALT 10* 9*  ALKPHOS 149* 141*  BILITOT 0.5 0.4  PROT 7.3 7.3  ALBUMIN 3.0* 3.1*   No results for input(s): LIPASE, AMYLASE in the last 168 hours. No results for input(s): AMMONIA in the last 168 hours. CBC:  Recent Labs Lab 12/21/16 0558 12/21/16 0610 12/22/16 0639 12/23/16 0558  WBC 6.7  --  7.3 8.1  NEUTROABS 4.1  --  5.2  --   HGB 12.2 13.9 11.5* 11.8*  HCT 38.5 41.0 37.1 37.7  MCV 98.2  --  97.1 97.4  PLT 269  --  272 268   Cardiac Enzymes: No results for input(s): CKTOTAL, CKMB, CKMBINDEX, TROPONINI in the last 168 hours. BNP: Invalid input(s): POCBNP CBG:  Recent Labs Lab 12/24/16 0745 12/24/16 1133 12/24/16 1642 12/24/16 2140 12/25/16 0727  GLUCAP 93 88 144* 146* 133*   D-Dimer No results for input(s): DDIMER in the last 72 hours. Hgb A1c  Recent Labs  12/23/16 0558  HGBA1C 5.3   Lipid Profile No results for input(s): CHOL, HDL, LDLCALC, TRIG, CHOLHDL, LDLDIRECT in the last 72 hours. Thyroid function studies No results for input(s): TSH,  T4TOTAL, T3FREE, THYROIDAB in the last 72 hours.  Invalid input(s): FREET3 Anemia work up No results for input(s): VITAMINB12, FOLATE, FERRITIN, TIBC, IRON, RETICCTPCT in the last 72 hours. Urinalysis    Component Value Date/Time   COLORURINE YELLOW 12/21/2016 0531   APPEARANCEUR HAZY (A) 12/21/2016 0531   LABSPEC 1.019 12/21/2016 0531   PHURINE 5.0 12/21/2016 0531   GLUCOSEU NEGATIVE 12/21/2016 0531   HGBUR NEGATIVE 12/21/2016 0531   BILIRUBINUR NEGATIVE 12/21/2016 0531   KETONESUR NEGATIVE 12/21/2016 0531   PROTEINUR NEGATIVE 12/21/2016 0531   UROBILINOGEN 0.2 09/28/2014 0900   NITRITE NEGATIVE 12/21/2016 0531   LEUKOCYTESUR NEGATIVE 12/21/2016 0531   Sepsis Labs Invalid input(s): PROCALCITONIN,  WBC,  LACTICIDVEN Microbiology No results found for this or any previous visit (from the past 240 hour(s)).   Time coordinating discharge: Over 30 minutes  SIGNED:   Charlynne Cousins, MD  [facility] 12/25/2016, 9:50 AM Pager   If 7PM-7AM, please contact night-coverage [email or web address] Password [email or web address]

## 2016-12-25 NOTE — Progress Notes (Signed)
SATURATION QUALIFICATIONS: (This note is used to comply with regulatory documentation for home oxygen)  Patient Saturations on Room Air at Rest = 93%  Patient Saturations on Room Air while Ambulating = 84%  Patient Saturations on 3 Liters of oxygen while Ambulating = 100%  Please briefly explain why patient needs home oxygen:supplement 02 with exertion.

## 2016-12-25 NOTE — Progress Notes (Signed)
Abbagale Goguen has been on albuterol inhaler at home during this hospitalization oxygen was required and will need oxygen at time of discharge. Jonnie Finner RN CCM Case Mgmt phone [phone]

## 2016-12-25 NOTE — Progress Notes (Signed)
SATURATION QUALIFICATIONS: (This note is used to comply with regulatory documentation for home oxygen)  Patient Saturations on Room Air at Rest = 98%  Patient Saturations on Room Air while Ambulating = 84%  Patient Saturations on 2 Liters of oxygen while Ambulating = 98%  Please briefly explain why patient needs home oxygen: COPD

## 2016-12-30 ENCOUNTER — Ambulatory Visit (HOSPITAL_BASED_OUTPATIENT_CLINIC_OR_DEPARTMENT_OTHER): Payer: Medicare Other

## 2016-12-30 ENCOUNTER — Other Ambulatory Visit (HOSPITAL_BASED_OUTPATIENT_CLINIC_OR_DEPARTMENT_OTHER): Payer: Medicare Other

## 2016-12-30 ENCOUNTER — Encounter: Payer: Self-pay | Admitting: Family

## 2016-12-30 VITALS — BP 111/73 | HR 101 | Temp 99.1°F | Resp 20

## 2016-12-30 DIAGNOSIS — C3491 Malignant neoplasm of unspecified part of right bronchus or lung: Secondary | ICD-10-CM

## 2016-12-30 DIAGNOSIS — C3411 Malignant neoplasm of upper lobe, right bronchus or lung: Secondary | ICD-10-CM | POA: Diagnosis not present

## 2016-12-30 DIAGNOSIS — R5382 Chronic fatigue, unspecified: Secondary | ICD-10-CM

## 2016-12-30 DIAGNOSIS — Z5112 Encounter for antineoplastic immunotherapy: Secondary | ICD-10-CM

## 2016-12-30 DIAGNOSIS — I10 Essential (primary) hypertension: Secondary | ICD-10-CM | POA: Diagnosis not present

## 2016-12-30 LAB — CBC WITH DIFFERENTIAL/PLATELET
BASO%: 0.1 % (ref 0.0–2.0)
Basophils Absolute: 0 10*3/uL (ref 0.0–0.1)
EOS%: 0.8 % (ref 0.0–7.0)
Eosinophils Absolute: 0.1 10*3/uL (ref 0.0–0.5)
HCT: 39.6 % (ref 34.8–46.6)
HEMOGLOBIN: 12.3 g/dL (ref 11.6–15.9)
LYMPH%: 14.9 % (ref 14.0–49.7)
MCH: 30.6 pg (ref 25.1–34.0)
MCHC: 31.1 g/dL — ABNORMAL LOW (ref 31.5–36.0)
MCV: 98.5 fL (ref 79.5–101.0)
MONO#: 1 10*3/uL — ABNORMAL HIGH (ref 0.1–0.9)
MONO%: 12.7 % (ref 0.0–14.0)
NEUT%: 71.5 % (ref 38.4–76.8)
NEUTROS ABS: 5.7 10*3/uL (ref 1.5–6.5)
Platelets: 246 10*3/uL (ref 145–400)
RBC: 4.02 10*6/uL (ref 3.70–5.45)
RDW: 15.6 % — AB (ref 11.2–14.5)
WBC: 8 10*3/uL (ref 3.9–10.3)
lymph#: 1.2 10*3/uL (ref 0.9–3.3)

## 2016-12-30 LAB — COMPREHENSIVE METABOLIC PANEL
ALBUMIN: 2.9 g/dL — AB (ref 3.5–5.0)
ALK PHOS: 152 U/L — AB (ref 40–150)
ALT: 21 U/L (ref 0–55)
AST: 26 U/L (ref 5–34)
Anion Gap: 10 mEq/L (ref 3–11)
BILIRUBIN TOTAL: 0.39 mg/dL (ref 0.20–1.20)
BUN: 26.1 mg/dL — AB (ref 7.0–26.0)
CO2: 25 meq/L (ref 22–29)
Calcium: 9.1 mg/dL (ref 8.4–10.4)
Chloride: 106 mEq/L (ref 98–109)
Creatinine: 1.3 mg/dL — ABNORMAL HIGH (ref 0.6–1.1)
EGFR: 45 mL/min/{1.73_m2} — ABNORMAL LOW (ref >90)
GLUCOSE: 162 mg/dL — AB (ref 70–140)
POTASSIUM: 3.9 meq/L (ref 3.5–5.1)
SODIUM: 141 meq/L (ref 136–145)
TOTAL PROTEIN: 7.1 g/dL (ref 6.4–8.3)

## 2016-12-30 LAB — TSH: TSH: 2.311 m[IU]/L (ref 0.308–3.960)

## 2016-12-30 MED ORDER — NIVOLUMAB CHEMO INJECTION 100 MG/10ML
240.0000 mg | Freq: Once | INTRAVENOUS | Status: AC
  Administered 2016-12-30: 240 mg via INTRAVENOUS
  Filled 2016-12-30: qty 24

## 2016-12-30 MED ORDER — SODIUM CHLORIDE 0.9 % IV SOLN
Freq: Once | INTRAVENOUS | Status: AC
  Administered 2016-12-30: 16:00:00 via INTRAVENOUS

## 2016-12-30 MED ORDER — ACETAMINOPHEN 325 MG PO TABS
650.0000 mg | ORAL_TABLET | Freq: Once | ORAL | Status: AC
  Administered 2016-12-30: 650 mg via ORAL

## 2016-12-30 MED ORDER — ACETAMINOPHEN 325 MG PO TABS
ORAL_TABLET | ORAL | Status: AC
  Filled 2016-12-30: qty 2

## 2016-12-30 NOTE — Patient Instructions (Signed)
[facility] Discharge Instructions for Patients Receiving Chemotherapy  Today you received the following mmunotherapy agents: Nivolumab.   If you develop nausea and vomiting that is not controlled by your nausea medication, call the clinic.   BELOW ARE SYMPTOMS THAT SHOULD BE REPORTED IMMEDIATELY:  *FEVER GREATER THAN 100.5 F  *CHILLS WITH OR WITHOUT FEVER  NAUSEA AND VOMITING THAT IS NOT CONTROLLED WITH YOUR NAUSEA MEDICATION  *UNUSUAL SHORTNESS OF BREATH  *UNUSUAL BRUISING OR BLEEDING  TENDERNESS IN MOUTH AND THROAT WITH OR WITHOUT PRESENCE OF ULCERS  *URINARY PROBLEMS  *BOWEL PROBLEMS  UNUSUAL RASH Items with * indicate a potential emergency and should be followed up as soon as possible.  Feel free to call the clinic should you have any questions or concerns. The clinic phone number is [phone].  Please show the [place] at check-in to the Emergency Department and triage nurse.  Nivolumab injection What is this medicine? NIVOLUMAB (nye VOL ue mab) is a monoclonal antibody. It is used to treat melanoma, lung cancer, kidney cancer, head and neck cancer, Hodgkin lymphoma, urothelial cancer, colon cancer, and liver cancer. This medicine may be used for other purposes; ask your health care provider or pharmacist if you have questions. COMMON BRAND NAME(S): Opdivo What should I tell my health care provider before I take this medicine? They need to know if you have any of these conditions: -diabetes -immune system problems -kidney disease -liver disease -lung disease -organ transplant -stomach or intestine problems -thyroid disease -an unusual or allergic reaction to nivolumab, other medicines, foods, dyes, or preservatives -pregnant or trying to get pregnant -breast-feeding How should I use this medicine? This medicine is for infusion into a vein. It is given by a health care professional in a hospital or clinic setting. A special MedGuide  will be given to you before each treatment. Be sure to read this information carefully each time. Talk to your pediatrician regarding the use of this medicine in children. While this drug may be prescribed for children as young as 12 years for selected conditions, precautions do apply. Overdosage: If you think you have taken too much of this medicine contact a poison control center or emergency room at once. NOTE: This medicine is only for you. Do not share this medicine with others. What if I miss a dose? It is important not to miss your dose. Call your doctor or health care professional if you are unable to keep an appointment. What may interact with this medicine? Interactions have not been studied. Give your health care provider a list of all the medicines, herbs, non-prescription drugs, or dietary supplements you use. Also tell them if you smoke, drink alcohol, or use illegal drugs. Some items may interact with your medicine. This list may not describe all possible interactions. Give your health care provider a list of all the medicines, herbs, non-prescription drugs, or dietary supplements you use. Also tell them if you smoke, drink alcohol, or use illegal drugs. Some items may interact with your medicine. What should I watch for while using this medicine? This drug may make you feel generally unwell. Continue your course of treatment even though you feel ill unless your doctor tells you to stop. You may need blood work done while you are taking this medicine. Do not become pregnant while taking this medicine or for 5 months after stopping it. Women should inform their doctor if they wish to become pregnant or think they might be pregnant. There is  a potential for serious side effects to an unborn child. Talk to your health care professional or pharmacist for more information. Do not breast-feed an infant while taking this medicine. What side effects may I notice from receiving this  medicine? Side effects that you should report to your doctor or health care professional as soon as possible: -allergic reactions like skin rash, itching or hives, swelling of the face, lips, or tongue -black, tarry stools -blood in the urine -bloody or watery diarrhea -changes in vision -change in sex drive -changes in emotions or moods -chest pain -confusion -cough -decreased appetite -diarrhea -facial flushing -feeling faint or lightheaded -fever, chills -hair loss -hallucination, loss of contact with reality -headache -irritable -joint pain -loss of memory -muscle pain -muscle weakness -seizures -shortness of breath -signs and symptoms of high blood sugar such as dizziness; dry mouth; dry skin; fruity breath; nausea; stomach pain; increased hunger or thirst; increased urination -signs and symptoms of kidney injury like trouble passing urine or change in the amount of urine -signs and symptoms of liver injury like dark yellow or brown urine; general ill feeling or flu-like symptoms; light-colored stools; loss of appetite; nausea; right upper belly pain; unusually weak or tired; yellowing of the eyes or skin -stiff neck -swelling of the ankles, feet, hands -weight gain Side effects that usually do not require medical attention (report to your doctor or health care professional if they continue or are bothersome): -bone pain -constipation -tiredness -vomiting This list may not describe all possible side effects. Call your doctor for medical advice about side effects. You may report side effects to FDA at 1-800-FDA-1088. Where should I keep my medicine? This drug is given in a hospital or clinic and will not be stored at home. NOTE: This sheet is a summary. It may not cover all possible information. If you have questions about this medicine, talk to your doctor, pharmacist, or health care provider.  2018 Elsevier/Gold Standard (2016-03-16 17:49:34)

## 2016-12-30 NOTE — Progress Notes (Signed)
Pt reported "bad headache" grimacing, rubbing forehead and fidgeting in chair. Dr. Julien Nordmann notified, order received for Tylenol 650.

## 2017-01-06 ENCOUNTER — Telehealth: Payer: Self-pay | Admitting: Medical Oncology

## 2017-01-06 NOTE — Telephone Encounter (Signed)
Faxed requested information to [facility] for [place].

## 2017-01-12 ENCOUNTER — Ambulatory Visit: Payer: Medicare Other | Admitting: Family

## 2017-01-12 ENCOUNTER — Other Ambulatory Visit (HOSPITAL_COMMUNITY): Payer: Medicare Other

## 2017-01-13 ENCOUNTER — Ambulatory Visit (HOSPITAL_BASED_OUTPATIENT_CLINIC_OR_DEPARTMENT_OTHER): Payer: Medicare Other

## 2017-01-13 ENCOUNTER — Encounter: Payer: Self-pay | Admitting: Internal Medicine

## 2017-01-13 ENCOUNTER — Ambulatory Visit: Payer: Medicare Other

## 2017-01-13 ENCOUNTER — Telehealth: Payer: Self-pay | Admitting: Medical Oncology

## 2017-01-13 ENCOUNTER — Other Ambulatory Visit (HOSPITAL_BASED_OUTPATIENT_CLINIC_OR_DEPARTMENT_OTHER): Payer: Medicare Other

## 2017-01-13 ENCOUNTER — Other Ambulatory Visit: Payer: Self-pay | Admitting: Medical Oncology

## 2017-01-13 ENCOUNTER — Ambulatory Visit (HOSPITAL_BASED_OUTPATIENT_CLINIC_OR_DEPARTMENT_OTHER): Payer: Medicare Other | Admitting: Internal Medicine

## 2017-01-13 VITALS — BP 141/79 | HR 113 | Temp 99.1°F | Resp 16 | Ht 63.0 in

## 2017-01-13 DIAGNOSIS — L98419 Non-pressure chronic ulcer of buttock with unspecified severity: Secondary | ICD-10-CM | POA: Diagnosis not present

## 2017-01-13 DIAGNOSIS — E279 Disorder of adrenal gland, unspecified: Secondary | ICD-10-CM

## 2017-01-13 DIAGNOSIS — Z5112 Encounter for antineoplastic immunotherapy: Secondary | ICD-10-CM

## 2017-01-13 DIAGNOSIS — D6481 Anemia due to antineoplastic chemotherapy: Secondary | ICD-10-CM

## 2017-01-13 DIAGNOSIS — C3411 Malignant neoplasm of upper lobe, right bronchus or lung: Secondary | ICD-10-CM

## 2017-01-13 DIAGNOSIS — C3491 Malignant neoplasm of unspecified part of right bronchus or lung: Secondary | ICD-10-CM

## 2017-01-13 DIAGNOSIS — J441 Chronic obstructive pulmonary disease with (acute) exacerbation: Secondary | ICD-10-CM

## 2017-01-13 DIAGNOSIS — T451X5A Adverse effect of antineoplastic and immunosuppressive drugs, initial encounter: Secondary | ICD-10-CM

## 2017-01-13 LAB — COMPREHENSIVE METABOLIC PANEL
ALBUMIN: 2.8 g/dL — AB (ref 3.5–5.0)
ALK PHOS: 210 U/L — AB (ref 40–150)
ALT: 12 U/L (ref 0–55)
AST: 15 U/L (ref 5–34)
Anion Gap: 13 mEq/L — ABNORMAL HIGH (ref 3–11)
BUN: 18.2 mg/dL (ref 7.0–26.0)
CO2: 24 mEq/L (ref 22–29)
Calcium: 10 mg/dL (ref 8.4–10.4)
Chloride: 106 mEq/L (ref 98–109)
Creatinine: 1.1 mg/dL (ref 0.6–1.1)
EGFR: 53 mL/min/{1.73_m2} — AB (ref >90)
GLUCOSE: 94 mg/dL (ref 70–140)
POTASSIUM: 4.1 meq/L (ref 3.5–5.1)
SODIUM: 143 meq/L (ref 136–145)
Total Bilirubin: 0.78 mg/dL (ref 0.20–1.20)
Total Protein: 7.4 g/dL (ref 6.4–8.3)

## 2017-01-13 LAB — CBC WITH DIFFERENTIAL/PLATELET
BASO%: 0.7 % (ref 0.0–2.0)
BASOS ABS: 0.1 10*3/uL (ref 0.0–0.1)
EOS ABS: 0.1 10*3/uL (ref 0.0–0.5)
EOS%: 0.7 % (ref 0.0–7.0)
HCT: 41.7 % (ref 34.8–46.6)
HEMOGLOBIN: 13.5 g/dL (ref 11.6–15.9)
LYMPH%: 17.5 % (ref 14.0–49.7)
MCH: 31.3 pg (ref 25.1–34.0)
MCHC: 32.4 g/dL (ref 31.5–36.0)
MCV: 96.4 fL (ref 79.5–101.0)
MONO#: 1 10*3/uL — ABNORMAL HIGH (ref 0.1–0.9)
MONO%: 11.4 % (ref 0.0–14.0)
NEUT#: 6.4 10*3/uL (ref 1.5–6.5)
NEUT%: 69.7 % (ref 38.4–76.8)
Platelets: 277 10*3/uL (ref 145–400)
RBC: 4.32 10*6/uL (ref 3.70–5.45)
RDW: 15.8 % — ABNORMAL HIGH (ref 11.2–14.5)
WBC: 9.2 10*3/uL (ref 3.9–10.3)
lymph#: 1.6 10*3/uL (ref 0.9–3.3)

## 2017-01-13 MED ORDER — SODIUM CHLORIDE 0.9 % IV SOLN
240.0000 mg | Freq: Once | INTRAVENOUS | Status: AC
  Administered 2017-01-13: 240 mg via INTRAVENOUS
  Filled 2017-01-13: qty 24

## 2017-01-13 MED ORDER — SODIUM CHLORIDE 0.9 % IV SOLN
Freq: Once | INTRAVENOUS | Status: AC
  Administered 2017-01-13: 16:00:00 via INTRAVENOUS

## 2017-01-13 NOTE — Patient Instructions (Signed)
[facility] Discharge Instructions for Patients Receiving Chemotherapy  Today you received the following chemotherapy agents:  Nivolumab.  To help prevent nausea and vomiting after your treatment, we encourage you to take your nausea medication as directed.   If you develop nausea and vomiting that is not controlled by your nausea medication, call the clinic.   BELOW ARE SYMPTOMS THAT SHOULD BE REPORTED IMMEDIATELY:  *FEVER GREATER THAN 100.5 F  *CHILLS WITH OR WITHOUT FEVER  NAUSEA AND VOMITING THAT IS NOT CONTROLLED WITH YOUR NAUSEA MEDICATION  *UNUSUAL SHORTNESS OF BREATH  *UNUSUAL BRUISING OR BLEEDING  TENDERNESS IN MOUTH AND THROAT WITH OR WITHOUT PRESENCE OF ULCERS  *URINARY PROBLEMS  *BOWEL PROBLEMS  UNUSUAL RASH Items with * indicate a potential emergency and should be followed up as soon as possible.  Feel free to call the clinic you have any questions or concerns. The clinic phone number is [phone].  Please show the CHEMO ALERT CARD at check-in to the Emergency Department and triage nurse.   

## 2017-01-13 NOTE — Telephone Encounter (Addendum)
Pt referred to wound clinic for skin sore on crease of buttocks. Appt , address and phone number made for august 9th at 1345-arrive at 1330 to check in appointment information given to family [place].

## 2017-01-13 NOTE — Progress Notes (Signed)
[place] [phone]   [phone]  OFFICE PROGRESS NOTE  Nolene Ebbs, MD [place] [address]  DIAGNOSIS: Lung cancer  Primary site: Lung (Right)  Staging method: AJCC 7th Edition  Clinical: Stage IIIA (T3, N2, M0) signed by Curt Bears, MD on 01/01/2014 6:57 PM  Summary: Stage IIIA (T3, N2, M0)   PRIOR THERAPY:  1) palliative radiotherapy to the right upper lobe lung mass under the care of Dr. Pablo Ledger completed September 2013. 2) Systemic chemotherapy with carboplatin for an AUC of 4 and Alimta 400 mg/m2 given in 3 weeks. Status post 6 cycles, last dose was given 05/07/2014 with partial response.  3) Systemic chemotherapy with single agent Alimta 400 MG/M2 every 3 weeks. First dose given 09/17/2014. Status post 6 cycles, last dose was given on 02/06/2015 discontinued secondary to intolerance.   CURRENT THERAPY: Second line treatment with immunotherapy with Nivolumab 240 mg IV every 2 weeks first dose 12/30/2016.  INTERVAL HISTORY: Beverly Lewis 83 y.o. female returns to the clinic today for follow-up visit accompanied by a family member. The patient tolerated the first cycle of her treatment well except for a few days of fatigue and sickness. She is recovering from her recent admission with COPD exacerbation and questionable pneumonia. She is feeling much better today. She denied having any fever or chills. She has no nausea, vomiting, diarrhea or constipation. She has no weight loss or night sweats. She has no chest pain but continues to have shortness of breath with exertion with no hemoptysis. Her family member mentioned that the patient has a developing ulcer at the buttock area. She is here for evaluation before starting cycle #2 of her immunotherapy.  MEDICAL HISTORY: Past Medical History:  Diagnosis Date  . AAA (abdominal aortic aneurysm) ([place])   . Arthritis   . Cerebrovascular accident [facility])  history of mild cerebrovascular accident which caused some visual disturbance.   . Chronic fatigue 12/15/2016  . Cough with hemoptysis 01/22/12   Prior to discharge  . Diabetes mellitus   . GERD (gastroesophageal reflux disease)   . History of radiation therapy 02/18/12,02/23/12,02/25/12., 03/01/12,&03/03/12   RULlung 50Gy/5/fx  . Hyperlipidemia   . Hypertension   . Lung cancer ([place]) 01/21/12  . Lung mass    Right upper Lobe  . Morbid obesity ([place])   . Peripheral vascular disease (HCC)    hx blood clot 30 yrs ago leg  . Pneumonia    hx  . Pneumothorax, iatrogenic 8/2/113-discharge note   Post Biospy  . Shortness of breath   . Tachycardia 01/22/12   Prior to discharge    ALLERGIES:  is allergic to asa buff (mag [buffered aspirin].  MEDICATIONS:  Current Outpatient Prescriptions  Medication Sig Dispense Refill  . acetaminophen (TYLENOL) 500 MG tablet Take 500 mg by mouth every 6 (six) hours as needed for moderate pain (inflammation).    Marland Kitchen albuterol (PROVENTIL HFA;VENTOLIN HFA) 108 (90 Base) MCG/ACT inhaler Inhale 2 puffs into the lungs every 6 (six) hours as needed for wheezing or shortness of breath. 1 Inhaler 2  . allopurinol (ZYLOPRIM) 100 MG tablet Take 100 mg by mouth daily.     Marland Kitchen aspirin EC 81 MG tablet Take 81 mg by mouth daily.    Marland Kitchen atorvastatin (LIPITOR) 20 MG tablet Take 20 mg by mouth daily.      . cholecalciferol (VITAMIN D) 1000 UNITS tablet Take 1,000 Units by mouth daily.    Marland Kitchen  docusate sodium (COLACE) 100 MG capsule Take 1 capsule (100 mg total) by mouth every 12 (twelve) hours. 60 capsule 0  . gabapentin (NEURONTIN) 300 MG capsule Take 300 mg by mouth 3 (three) times daily. As directed  5  . HYDROcodone-acetaminophen (NORCO/VICODIN) 5-325 MG tablet Take 1 tablet by mouth every 6 (six) hours as needed. 15 tablet 0  . JANUVIA 100 MG tablet Take 100 mg by mouth daily.     Marland Kitchen oxyCODONE-acetaminophen (PERCOCET/ROXICET) 5-325 MG tablet Take 1-2 tablets by mouth every 4 (four) hours  as needed for moderate pain or severe pain. 10 tablet 0  . pantoprazole (PROTONIX) 40 MG tablet Take 40 mg by mouth daily.     . predniSONE (DELTASONE) 10 MG tablet Takes 6 tablets for 1 days, then 5 tablets for 1 days, then 4 tablets for 1 days, then 3 tablets for 1 days, then 2 tabs for 1 days, then 1 tab for 1 days, and then stop. 21 tablet 0   No current facility-administered medications for this visit.     SURGICAL HISTORY:  Past Surgical History:  Procedure Laterality Date  . ABDOMINAL AORTIC ANEURYSM REPAIR  06/28/10   Stent Graft repair    . Aortogram   03/03/11   with stenting , left external iliac artery  . APPENDECTOMY    . EXPLORATORY LAPAROTOMY     For ectopic pregnancy  . LUMBAR DISC SURGERY    . Navigational Bronchoscopy     Right Upper Lobe Mass with Biopsies, Brushings, Washings, and bronchoalveolar Lavage:  . Paratracheal Adenopathy and Bilateral Adrenal Nodules      REVIEW OF SYSTEMS:  A comprehensive review of systems was negative except for: Constitutional: positive for fatigue Respiratory: positive for dyspnea on exertion   PHYSICAL EXAMINATION: General appearance: alert, cooperative, fatigued and no distress Head: Normocephalic, without obvious abnormality, atraumatic Neck: no adenopathy, no JVD, supple, symmetrical, trachea midline and thyroid not enlarged, symmetric, no tenderness/mass/nodules Lymph nodes: Cervical, supraclavicular, and axillary nodes normal. Resp: clear to auscultation bilaterally Back: symmetric, no curvature. ROM normal. No CVA tenderness. Cardio: regular rate and rhythm, S1, S2 normal, no murmur, click, rub or gallop GI: soft, non-tender; bowel sounds normal; no masses,  no organomegaly Extremities: edema 1+  ECOG PERFORMANCE STATUS: 2 - Symptomatic, <50% confined to bed  Blood pressure (!) 141/79, pulse (!) 113, temperature 99.1 F (37.3 C), temperature source Oral, resp. rate 16, height 5\' 3"  (1.6 m), SpO2 97 %.  LABORATORY  DATA: Lab Results  Component Value Date   WBC 9.2 01/13/2017   HGB 13.5 01/13/2017   HCT 41.7 01/13/2017   MCV 96.4 01/13/2017   PLT 277 01/13/2017      Chemistry      Component Value Date/Time   NA 141 12/30/2016 1314   K 3.9 12/30/2016 1314   CL 100 (L) 12/25/2016 0537   CO2 25 12/30/2016 1314   BUN 26.1 (H) 12/30/2016 1314   CREATININE 1.3 (H) 12/30/2016 1314      Component Value Date/Time   CALCIUM 9.1 12/30/2016 1314   ALKPHOS 152 (H) 12/30/2016 1314   AST 26 12/30/2016 1314   ALT 21 12/30/2016 1314   BILITOT 0.39 12/30/2016 1314       RADIOGRAPHIC STUDIES: Ct Angio Chest Pe W And/or Wo Contrast  Result Date: 12/21/2016 CLINICAL DATA:  Chest pain, left-sided chest wall pain and history of lung carcinoma. EXAM: CT ANGIOGRAPHY CHEST WITH CONTRAST TECHNIQUE: Multidetector CT imaging of the chest was performed using  the standard protocol during bolus administration of intravenous contrast. Multiplanar CT image reconstructions and MIPs were obtained to evaluate the vascular anatomy. CONTRAST:  100 mL Isovue 370 IV COMPARISON:  CT of the chest without contrast on 10/06/2016 as well as multiple prior additional studies. FINDINGS: Cardiovascular: The pulmonary arteries are well opacified and demonstrate no evidence of pulmonary embolism. Stable heart size and calcified coronary artery plaque in the distribution of the LAD and left circumflex coronary arteries. The thoracic aorta demonstrates stable atherosclerosis with irregular calcified and noncalcified plaque present, some of which appears ulcerated. Stable mild aneurysmal dilatation of the ascending thoracic aorta measures approximately 4.3 cm. Stable thrombosed penetrating ulcer creating 2.5 cm aneurysmal outpouching along the superior aspect of the proximal descending thoracic aorta along the posterior aspect of the aortic arch. No pericardial effusion identified. Mediastinum/Nodes: Interval enlargement of right lower paratracheal  lymph node measuring 1.6 cm in short axis compared to approximately 1.3 cm previously. Lungs/Pleura: There is progression of malignancy since prior imaging. Disease in the right upper lobe appears fairly stable. Confluent masses in the posterior perihilar region of the right lung extending into the right lower lobe are more prominent with maximal conglomerate transverse diameter of tumor measuring approximately 4 x 7.5 cm. Spiculated nodule in the left upper lobe on image number 27 appears fairly stable and measures approximately 1.7 cm. Nodule in the superior segment of the left lower lobe on image number 30 measures 8 mm and is fairly stable. There is progression of tumor in the posterior and inferior left lower lobe with merging of separate areas of tumor seen previously into a more confluent area of tumor now measuring approximately 5 cm in greatest diameter. Irregular areas of lobulated tumor at the right lung base appear fairly stable and measure approximately 8.3 cm in greatest transverse diameter. Upper Abdomen: Progression of bilateral adrenal metastases with left adrenal metastasis now measuring 3.6 x 4.5 cm compared to approximately 3.3 cm in maximal diameter previously and right adrenal metastasis measuring 2.6 x 3.0 cm compared to 2 cm previously. Musculoskeletal: No chest wall abnormality. No acute or significant osseous findings. Review of the MIP images confirms the above findings. IMPRESSION: 1. No evidence of acute pulmonary embolism. 2. Progression of lung carcinoma with enlarged right lower paratracheal lymph node and progression of left lower lobe tumor and bilateral adrenal metastases. Malignancy in the right lung and left upper lung appears relatively stable. 3. 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 Electronically Signed   By: Aletta Edouard M.D.   On: 12/21/2016 09:14   ASSESSMENT AND  PLAN:  This is a very pleasant 83 years old African-American female with recurrent non-small cell lung cancer, adenocarcinoma status post systemic chemotherapy with carboplatin and Alimta discontinued secondary to intolerance.  She hasn't observation for more than a year. Recent imaging studies in April 2018 showed evidence for disease progression with interval growth of locally recurrent central right upper lobe lung mass and significant progression of bilateral pulmonary metastasis. There was also enlarging of left adrenal mass suspicious for adrenal metastasis. The patient was started on treatment with immunotherapy with Nivolumab status post 1 cycle. She tolerated the first cycle fairly well. I recommended for her to proceed with cycle #2 today as a scheduled. I will see her back for follow-up visit in 2 weeks for evaluation before starting cycle #3. For the starting ulcer and the buttock area, we will refer  the patient to the wound clinic for evaluation and management. The patient was advised to call immediately if she has any concerning symptoms in the interval. The patient voices understanding of current disease status and treatment options and is in agreement with the current care plan. All questions were answered. The patient knows to call the clinic with any problems, questions or concerns. We can certainly see the patient much sooner if necessary.   Disclaimer: This note was dictated with voice recognition software. Similar sounding words can inadvertently be transcribed and may not be corrected upon review.

## 2017-01-26 ENCOUNTER — Telehealth: Payer: Self-pay | Admitting: Medical Oncology

## 2017-01-26 NOTE — Telephone Encounter (Addendum)
Family member called to cancel appts tomorrow . Pt has wound clinic appt Thursday and she would be too tired after tx on wed to go to wound clinic appts and she really needs to go to wound clinic appt. Schedule message sent to r/s to Friday with Kristen.

## 2017-01-27 ENCOUNTER — Telehealth: Payer: Self-pay | Admitting: Internal Medicine

## 2017-01-27 ENCOUNTER — Other Ambulatory Visit: Payer: Medicare Other

## 2017-01-27 ENCOUNTER — Encounter: Payer: Medicare Other | Admitting: Nutrition

## 2017-01-27 ENCOUNTER — Ambulatory Visit: Payer: Medicare Other

## 2017-01-27 ENCOUNTER — Ambulatory Visit: Payer: Medicare Other | Admitting: Internal Medicine

## 2017-01-27 NOTE — Telephone Encounter (Signed)
Per sch msg called Corine Trueblood with 8/10  appt for pt at 1030 am [phone]

## 2017-01-28 ENCOUNTER — Encounter (HOSPITAL_BASED_OUTPATIENT_CLINIC_OR_DEPARTMENT_OTHER): Payer: Medicare Other | Attending: Internal Medicine

## 2017-01-28 DIAGNOSIS — Z79899 Other long term (current) drug therapy: Secondary | ICD-10-CM | POA: Insufficient documentation

## 2017-01-28 DIAGNOSIS — L89321 Pressure ulcer of left buttock, stage 1: Secondary | ICD-10-CM | POA: Insufficient documentation

## 2017-01-28 DIAGNOSIS — C50919 Malignant neoplasm of unspecified site of unspecified female breast: Secondary | ICD-10-CM | POA: Diagnosis not present

## 2017-01-28 DIAGNOSIS — Z7982 Long term (current) use of aspirin: Secondary | ICD-10-CM | POA: Diagnosis not present

## 2017-01-28 DIAGNOSIS — Z993 Dependence on wheelchair: Secondary | ICD-10-CM | POA: Insufficient documentation

## 2017-01-28 DIAGNOSIS — L89311 Pressure ulcer of right buttock, stage 1: Secondary | ICD-10-CM | POA: Insufficient documentation

## 2017-01-28 DIAGNOSIS — E11622 Type 2 diabetes mellitus with other skin ulcer: Secondary | ICD-10-CM | POA: Diagnosis present

## 2017-01-28 DIAGNOSIS — Z7984 Long term (current) use of oral hypoglycemic drugs: Secondary | ICD-10-CM | POA: Insufficient documentation

## 2017-01-29 ENCOUNTER — Ambulatory Visit (HOSPITAL_BASED_OUTPATIENT_CLINIC_OR_DEPARTMENT_OTHER): Payer: Medicare Other | Admitting: Oncology

## 2017-01-29 ENCOUNTER — Ambulatory Visit (HOSPITAL_BASED_OUTPATIENT_CLINIC_OR_DEPARTMENT_OTHER): Payer: Medicare Other

## 2017-01-29 ENCOUNTER — Encounter: Payer: Self-pay | Admitting: Oncology

## 2017-01-29 ENCOUNTER — Other Ambulatory Visit (HOSPITAL_BASED_OUTPATIENT_CLINIC_OR_DEPARTMENT_OTHER): Payer: Medicare Other

## 2017-01-29 VITALS — BP 94/70 | HR 69 | Temp 97.7°F | Resp 16 | Ht 63.0 in

## 2017-01-29 DIAGNOSIS — M25572 Pain in left ankle and joints of left foot: Secondary | ICD-10-CM | POA: Insufficient documentation

## 2017-01-29 DIAGNOSIS — C3491 Malignant neoplasm of unspecified part of right bronchus or lung: Secondary | ICD-10-CM

## 2017-01-29 DIAGNOSIS — C3411 Malignant neoplasm of upper lobe, right bronchus or lung: Secondary | ICD-10-CM

## 2017-01-29 DIAGNOSIS — Z79899 Other long term (current) drug therapy: Secondary | ICD-10-CM | POA: Diagnosis not present

## 2017-01-29 DIAGNOSIS — Z5112 Encounter for antineoplastic immunotherapy: Secondary | ICD-10-CM | POA: Diagnosis not present

## 2017-01-29 DIAGNOSIS — R5382 Chronic fatigue, unspecified: Secondary | ICD-10-CM

## 2017-01-29 LAB — CBC WITH DIFFERENTIAL/PLATELET
BASO%: 0.9 % (ref 0.0–2.0)
Basophils Absolute: 0.1 10*3/uL (ref 0.0–0.1)
EOS%: 0.7 % (ref 0.0–7.0)
Eosinophils Absolute: 0.1 10*3/uL (ref 0.0–0.5)
HEMATOCRIT: 36.9 % (ref 34.8–46.6)
HEMOGLOBIN: 11.9 g/dL (ref 11.6–15.9)
LYMPH#: 1.1 10*3/uL (ref 0.9–3.3)
LYMPH%: 14.7 % (ref 14.0–49.7)
MCH: 31.3 pg (ref 25.1–34.0)
MCHC: 32.4 g/dL (ref 31.5–36.0)
MCV: 96.9 fL (ref 79.5–101.0)
MONO#: 0.9 10*3/uL (ref 0.1–0.9)
MONO%: 11.4 % (ref 0.0–14.0)
NEUT%: 72.3 % (ref 38.4–76.8)
NEUTROS ABS: 5.5 10*3/uL (ref 1.5–6.5)
PLATELETS: 316 10*3/uL (ref 145–400)
RBC: 3.81 10*6/uL (ref 3.70–5.45)
RDW: 16.4 % — AB (ref 11.2–14.5)
WBC: 7.6 10*3/uL (ref 3.9–10.3)

## 2017-01-29 LAB — COMPREHENSIVE METABOLIC PANEL
ALT: 14 U/L (ref 0–55)
ANION GAP: 8 meq/L (ref 3–11)
AST: 20 U/L (ref 5–34)
Albumin: 2.3 g/dL — ABNORMAL LOW (ref 3.5–5.0)
Alkaline Phosphatase: 252 U/L — ABNORMAL HIGH (ref 40–150)
BILIRUBIN TOTAL: 0.63 mg/dL (ref 0.20–1.20)
BUN: 21.6 mg/dL (ref 7.0–26.0)
CALCIUM: 9.4 mg/dL (ref 8.4–10.4)
CO2: 26 mEq/L (ref 22–29)
CREATININE: 1.1 mg/dL (ref 0.6–1.1)
Chloride: 108 mEq/L (ref 98–109)
EGFR: 52 mL/min/{1.73_m2} — ABNORMAL LOW (ref >90)
Glucose: 92 mg/dl (ref 70–140)
Potassium: 4.3 mEq/L (ref 3.5–5.1)
Sodium: 142 mEq/L (ref 136–145)
TOTAL PROTEIN: 6.7 g/dL (ref 6.4–8.3)

## 2017-01-29 LAB — TSH: TSH: 2.253 m[IU]/L (ref 0.308–3.960)

## 2017-01-29 MED ORDER — SODIUM CHLORIDE 0.9 % IV SOLN
240.0000 mg | Freq: Once | INTRAVENOUS | Status: AC
  Administered 2017-01-29: 240 mg via INTRAVENOUS
  Filled 2017-01-29: qty 24

## 2017-01-29 MED ORDER — ACETAMINOPHEN 325 MG PO TABS
ORAL_TABLET | ORAL | Status: AC
  Filled 2017-01-29: qty 2

## 2017-01-29 MED ORDER — SODIUM CHLORIDE 0.9 % IV SOLN
Freq: Once | INTRAVENOUS | Status: AC
  Administered 2017-01-29: 13:00:00 via INTRAVENOUS

## 2017-01-29 MED ORDER — ACETAMINOPHEN 325 MG PO TABS
650.0000 mg | ORAL_TABLET | Freq: Once | ORAL | Status: AC
  Administered 2017-01-29: 650 mg via ORAL

## 2017-01-29 NOTE — Progress Notes (Signed)
[facility] Follow up:    Beverly Ebbs, MD [place] [address]   DIAGNOSIS: Cancer Staging Non-small cell carcinoma of lung, stage 3 (HCC) Staging form: Lung, AJCC 7th Edition - Clinical: Stage IIIA (T3, N2, M0) - Signed by Curt Bears, MD on 01/01/2014 - Pathologic: No stage assigned - Unsigned  Lung cancer  Primary site: Lung (Right)  Staging method: AJCC 7th Edition  Clinical: Stage IIIA (T3, N2, M0) signed by Curt Bears, MD on 01/01/2014 6:57 PM  Summary: Stage IIIA (T3, N2, M0)   SUMMARY OF ONCOLOGIC HISTORY:  No history exists.   PRIOR THERAPY:  1) palliative radiotherapy to the right upper lobe lung mass under the care of Dr. Pablo Ledger completed September 2013. 2) Systemic chemotherapy with carboplatin for an AUC of 4 and Alimta 400 mg/m2 given in 3 weeks. Status post 6 cycles, last dose was given 05/07/2014 with partial response.  3) Systemic chemotherapy with single agent Alimta 400 MG/M2 every 3 weeks. First dose given 09/17/2014. Status post 6 cycles, last dose was given on 02/06/2015 discontinued secondary to intolerance.  CURRENT THERAPY: Second line treatment with immunotherapy with Nivolumab 240 mg IV every 2 weeks first dose 12/30/2016.  INTERVAL HISTORY: Beverly Lewis 83 y.o. female returns for routine follow-up with a family member. The patient continues to tolerate her Nivolumab well with the exception of mild fatigue. At her last visit, the patient was referred to the wound clinic for developing ulcer on her buttocks. She was seen by them earlier this week. Note is unavailable to me, but the family member states that the do not need to follow-up with the wound center. However, if the area opens up they are to follow-up with them. The patient reports having left ankle pain. Family member states that her ankle Twisted during the transfer. The report mild swelling to the ankle, but no bruising. She has not  taken any medication for the pain. She denies fevers and chills. Denies chest pain, shortness of breath, cough. Denies hemoptysis. She denies abdominal pain, nausea, and vomiting. Denies diarrhea. The patient is here for evaluation prior to cycle #3 of her immunotherapy.    Patient Active Problem List   Diagnosis Date Noted  . Left ankle pain 01/29/2017  . Encounter for antineoplastic immunotherapy 01/13/2017  . Acute on chronic respiratory failure with hypoxia ([place]) 12/23/2016  . COPD exacerbation ([place]) 12/21/2016  . Chronic fatigue 12/15/2016  . PAD (peripheral artery disease) ([place]) 10/01/2015  . Essential hypertension, benign 02/18/2015  . GERD without esophagitis 02/18/2015  . Hypokalemia 02/18/2015  . Chronic gout of multiple sites 02/18/2015  . Dyslipidemia 02/18/2015  . Type II diabetes mellitus with peripheral circulatory disorder ([place]) 02/18/2015  . Morbid obesity ([place]) 02/13/2015  . Erythema multiforme bullosum ([place]) 02/13/2015  . Anemia 02/12/2015  . Blister of lower leg 02/12/2015  . Pressure ulcer 02/12/2015  . Antineoplastic chemotherapy induced anemia 01/23/2015  . Encounter for antineoplastic chemotherapy 12/18/2014  . Sacral decubitus ulcer 09/28/2014  . Cellulitis of groin 09/28/2014  . Dehydration 09/28/2014  . Renal failure (ARF), acute on chronic (HCC) 09/28/2014  . Fracture of left humerus 09/28/2014  . Fungal infection of the groin 09/28/2014  . Fall at home 09/28/2014  . Failure to thrive in adult 09/28/2014  . Bilateral lower extremity edema 06/04/2014  . Lung mass 01/22/2012  . Non-small cell carcinoma of lung, stage 3 ([place]) 01/21/2012  . Abdominal aortic aneurysm ([place]) 09/29/2011  is allergic to asa buff (mag [buffered aspirin].  MEDICAL HISTORY: Past Medical History:  Diagnosis Date  . AAA (abdominal aortic aneurysm) ([place])   . Arthritis   . Cerebrovascular accident [facility]) history of mild cerebrovascular accident which caused some visual  disturbance.   . Chronic fatigue 12/15/2016  . Cough with hemoptysis 01/22/12   Prior to discharge  . Diabetes mellitus   . GERD (gastroesophageal reflux disease)   . History of radiation therapy 02/18/12,02/23/12,02/25/12., 03/01/12,&03/03/12   RULlung 50Gy/5/fx  . Hyperlipidemia   . Hypertension   . Lung cancer ([place]) 01/21/12  . Lung mass    Right upper Lobe  . Morbid obesity ([place])   . Peripheral vascular disease (HCC)    hx blood clot 30 yrs ago leg  . Pneumonia    hx  . Pneumothorax, iatrogenic 8/2/113-discharge note   Post Biospy  . Shortness of breath   . Tachycardia 01/22/12   Prior to discharge    SURGICAL HISTORY: Past Surgical History:  Procedure Laterality Date  . ABDOMINAL AORTIC ANEURYSM REPAIR  06/28/10   Stent Graft repair    . Aortogram   03/03/11   with stenting , left external iliac artery  . APPENDECTOMY    . EXPLORATORY LAPAROTOMY     For ectopic pregnancy  . LUMBAR DISC SURGERY    . Navigational Bronchoscopy     Right Upper Lobe Mass with Biopsies, Brushings, Washings, and bronchoalveolar Lavage:  . Paratracheal Adenopathy and Bilateral Adrenal Nodules      SOCIAL HISTORY: Social History   Social History  . Marital status: Widowed    Spouse name: N/A  . Number of children: N/A  . Years of education: N/A   Occupational History  . Not on file.   Social History Main Topics  . Smoking status: Former Smoker    Packs/day: 0.50    Years: 65.00    Types: Cigarettes    Quit date: 02/20/2016  . Smokeless tobacco: Never Used  . Alcohol use No     Comment: She does not consume alcohol on a regular basis.  . Drug use: No  . Sexual activity: No   Other Topics Concern  . Not on file   Social History Narrative  . No narrative on file    FAMILY HISTORY: Family History  Problem Relation Age of Onset  . Heart disease Mother   . Hypertension Mother   . Diabetes Mother   . Hyperlipidemia Mother   . Heart disease Father   . Hypertension Father   .  Diabetes Father   . Hyperlipidemia Father   . Diabetes Daughter     Review of Systems  Constitutional: Positive for fatigue. Negative for appetite change, chills, diaphoresis and fever.  HENT:  Negative.   Eyes: Negative.   Respiratory: Negative.   Cardiovascular: Positive for leg swelling. Negative for chest pain and palpitations.       Left ankle swelling.  Gastrointestinal: Negative.   Endocrine: Negative.   Genitourinary: Negative.    Musculoskeletal: Negative for back pain, flank pain, neck pain and neck stiffness.       Left ankle pain.  Skin: Negative.   Neurological: Negative.   Hematological: Negative.   Psychiatric/Behavioral: Negative.       PHYSICAL EXAMINATION  ECOG PERFORMANCE STATUS: 2 - Symptomatic, <50% confined to bed  Vitals:   01/29/17 1147  BP: 94/70  Pulse: 69  Resp: 16  Temp: 97.7 F (36.5 C)  SpO2: 91%  Physical Exam  Constitutional: She is oriented to person, place, and time and well-developed, well-nourished, and in no distress. No distress.  HENT:  Head: Normocephalic and atraumatic.  Mouth/Throat: Oropharynx is clear and moist. No oropharyngeal exudate.  Eyes: Conjunctivae are normal. No scleral icterus.  Neck: Normal range of motion. Neck supple.  Cardiovascular: Normal rate, regular rhythm, normal heart sounds and intact distal pulses.   Trace edema to the bilateral lower extremities.  Pulmonary/Chest: Effort normal and breath sounds normal. No respiratory distress. She has no wheezes. She has no rales.  Abdominal: Soft. Bowel sounds are normal. She exhibits no distension and no mass. There is no tenderness.  Musculoskeletal: Normal range of motion. She exhibits no deformity.  Left ankle with no decrease in strength or range of motion.  Lymphadenopathy:    She has no cervical adenopathy.  Neurological: She is alert and oriented to person, place, and time. She exhibits normal muscle tone.  Skin: Skin is warm and dry. No rash noted.  She is not diaphoretic. No erythema. No pallor.  Psychiatric: Mood, memory, affect and judgment normal.  Vitals reviewed.   LABORATORY DATA:  CBC    Component Value Date/Time   WBC 7.6 01/29/2017 1125   WBC 8.1 12/23/2016 0558   RBC 3.81 01/29/2017 1125   RBC 3.87 12/23/2016 0558   HGB 11.9 01/29/2017 1125   HCT 36.9 01/29/2017 1125   PLT 316 01/29/2017 1125   MCV 96.9 01/29/2017 1125   MCH 31.3 01/29/2017 1125   MCH 30.5 12/23/2016 0558   MCHC 32.4 01/29/2017 1125   MCHC 31.3 12/23/2016 0558   RDW 16.4 (H) 01/29/2017 1125   LYMPHSABS 1.1 01/29/2017 1125   MONOABS 0.9 01/29/2017 1125   EOSABS 0.1 01/29/2017 1125   BASOSABS 0.1 01/29/2017 1125    CMP     Component Value Date/Time   NA 142 01/29/2017 1125   K 4.3 01/29/2017 1125   CL 100 (L) 12/25/2016 0537   CO2 26 01/29/2017 1125   GLUCOSE 92 01/29/2017 1125   BUN 21.6 01/29/2017 1125   CREATININE 1.1 01/29/2017 1125   CALCIUM 9.4 01/29/2017 1125   PROT 6.7 01/29/2017 1125   ALBUMIN 2.3 (L) 01/29/2017 1125   AST 20 01/29/2017 1125   ALT 14 01/29/2017 1125   ALKPHOS 252 (H) 01/29/2017 1125   BILITOT 0.63 01/29/2017 1125   GFRNONAA 44 (L) 12/25/2016 0537   GFRAA 50 (L) 12/25/2016 0537    RADIOGRAPHIC STUDIES:  No results found.   ASSESSMENT and THERAPY PLAN:   Non-small cell carcinoma of lung, stage 3 ([place]) This is a very pleasant 83 year old African-American female with recurrent non-small cell lung cancer, adenocarcinoma status post systemic chemotherapy with carboplatin and Alimta discontinued secondary to intolerance.  She hasn't observation for more than a year. Recent imaging studies in April 2018 showed evidence for disease progression with interval growth of locally recurrent central right upper lobe lung mass and significant progression of bilateral pulmonary metastasis. There was also enlarging of left adrenal mass suspicious for adrenal metastasis. The patient was started on treatment with  immunotherapy with Nivolumab status post 2 cycles. She is tolerating her treatment fairly well. I recommended for her to proceed with cycle #3 today as a scheduled.  She will follow-up for evaluation in 2 weeks prior to cycle 4 of Nivolumab.  Left ankle pain Patient reports left ankle pain following twisting of her ankle after her transfer. She has trace edema to this area today. No  decrease in her range of motion. I have recommended that she use Tylenol as needed up to 3000 mg daily. I have recommended that she use ice to her ankle. If her pain or edema worsen, I recommend that she follow-up with her primary care provider for further evaluation. I have placed an order for Tylenol 650 mg by mouth today in the infusion room.   No orders of the defined types were placed in this encounter.   All questions were answered. The patient knows to call the clinic with any problems, questions or concerns. We can certainly see the patient much sooner if necessary.  Mikey Bussing, NP 01/29/2017

## 2017-01-29 NOTE — Patient Instructions (Signed)
[facility] Discharge Instructions for Patients Receiving Chemotherapy  Today you received the following chemotherapy agents:  Nivolumab.  To help prevent nausea and vomiting after your treatment, we encourage you to take your nausea medication as directed.   If you develop nausea and vomiting that is not controlled by your nausea medication, call the clinic.   BELOW ARE SYMPTOMS THAT SHOULD BE REPORTED IMMEDIATELY:  *FEVER GREATER THAN 100.5 F  *CHILLS WITH OR WITHOUT FEVER  NAUSEA AND VOMITING THAT IS NOT CONTROLLED WITH YOUR NAUSEA MEDICATION  *UNUSUAL SHORTNESS OF BREATH  *UNUSUAL BRUISING OR BLEEDING  TENDERNESS IN MOUTH AND THROAT WITH OR WITHOUT PRESENCE OF ULCERS  *URINARY PROBLEMS  *BOWEL PROBLEMS  UNUSUAL RASH Items with * indicate a potential emergency and should be followed up as soon as possible.  Feel free to call the clinic you have any questions or concerns. The clinic phone number is [phone].  Please show the CHEMO ALERT CARD at check-in to the Emergency Department and triage nurse.   

## 2017-01-29 NOTE — Assessment & Plan Note (Addendum)
Patient reports left ankle pain following twisting of her ankle after her transfer. She has trace edema to this area today. No decrease in her range of motion. I have recommended that she use Tylenol as needed up to 3000 mg daily. I have recommended that she use ice to her ankle. If her pain or edema worsen, I recommend that she follow-up with her primary care provider for further evaluation. I have placed an order for Tylenol 650 mg by mouth today in the infusion room.

## 2017-01-29 NOTE — Assessment & Plan Note (Addendum)
This is a very pleasant 83 year old African-American female with recurrent non-small cell lung cancer, adenocarcinoma status post systemic chemotherapy with carboplatin and Alimta discontinued secondary to intolerance.  She hasn't observation for more than a year. Recent imaging studies in April 2018 showed evidence for disease progression with interval growth of locally recurrent central right upper lobe lung mass and significant progression of bilateral pulmonary metastasis. There was also enlarging of left adrenal mass suspicious for adrenal metastasis. The patient was started on treatment with immunotherapy with Nivolumab status post 2 cycles. She is tolerating her treatment fairly well. I recommended for her to proceed with cycle #3 today as a scheduled.  She will follow-up for evaluation in 2 weeks prior to cycle 4 of Nivolumab.

## 2017-02-10 ENCOUNTER — Emergency Department (HOSPITAL_COMMUNITY): Payer: Medicare Other

## 2017-02-10 ENCOUNTER — Ambulatory Visit (HOSPITAL_BASED_OUTPATIENT_CLINIC_OR_DEPARTMENT_OTHER): Payer: Medicare Other

## 2017-02-10 ENCOUNTER — Encounter: Payer: Self-pay | Admitting: Internal Medicine

## 2017-02-10 ENCOUNTER — Encounter (HOSPITAL_COMMUNITY): Payer: Self-pay

## 2017-02-10 ENCOUNTER — Emergency Department (HOSPITAL_COMMUNITY)
Admission: EM | Admit: 2017-02-10 | Discharge: 2017-02-10 | Disposition: A | Discharge status: Home / Self Care | Payer: Medicare Other | Attending: Emergency Medicine | Admitting: Emergency Medicine

## 2017-02-10 ENCOUNTER — Telehealth: Payer: Self-pay | Admitting: Internal Medicine

## 2017-02-10 ENCOUNTER — Emergency Department (HOSPITAL_COMMUNITY): Admit: 2017-02-10 | Payer: Medicare Other

## 2017-02-10 ENCOUNTER — Ambulatory Visit (HOSPITAL_BASED_OUTPATIENT_CLINIC_OR_DEPARTMENT_OTHER): Payer: Medicare Other | Admitting: Internal Medicine

## 2017-02-10 ENCOUNTER — Ambulatory Visit: Payer: Medicare Other | Admitting: Nutrition

## 2017-02-10 ENCOUNTER — Other Ambulatory Visit (HOSPITAL_BASED_OUTPATIENT_CLINIC_OR_DEPARTMENT_OTHER): Payer: Medicare Other

## 2017-02-10 VITALS — BP 99/48 | HR 63 | Temp 99.0°F | Resp 19 | Ht 63.0 in

## 2017-02-10 DIAGNOSIS — Y939 Activity, unspecified: Secondary | ICD-10-CM | POA: Insufficient documentation

## 2017-02-10 DIAGNOSIS — R21 Rash and other nonspecific skin eruption: Secondary | ICD-10-CM | POA: Diagnosis not present

## 2017-02-10 DIAGNOSIS — S99922A Unspecified injury of left foot, initial encounter: Secondary | ICD-10-CM

## 2017-02-10 DIAGNOSIS — Z5112 Encounter for antineoplastic immunotherapy: Secondary | ICD-10-CM

## 2017-02-10 DIAGNOSIS — Z79899 Other long term (current) drug therapy: Secondary | ICD-10-CM | POA: Diagnosis not present

## 2017-02-10 DIAGNOSIS — W231XXA Caught, crushed, jammed, or pinched between stationary objects, initial encounter: Secondary | ICD-10-CM | POA: Diagnosis not present

## 2017-02-10 DIAGNOSIS — E279 Disorder of adrenal gland, unspecified: Secondary | ICD-10-CM | POA: Diagnosis not present

## 2017-02-10 DIAGNOSIS — L989 Disorder of the skin and subcutaneous tissue, unspecified: Secondary | ICD-10-CM

## 2017-02-10 DIAGNOSIS — Y9289 Other specified places as the place of occurrence of the external cause: Secondary | ICD-10-CM

## 2017-02-10 DIAGNOSIS — S82832A Other fracture of upper and lower end of left fibula, initial encounter for closed fracture: Secondary | ICD-10-CM

## 2017-02-10 DIAGNOSIS — J449 Chronic obstructive pulmonary disease, unspecified: Secondary | ICD-10-CM | POA: Diagnosis not present

## 2017-02-10 DIAGNOSIS — E119 Type 2 diabetes mellitus without complications: Secondary | ICD-10-CM | POA: Insufficient documentation

## 2017-02-10 DIAGNOSIS — R110 Nausea: Secondary | ICD-10-CM | POA: Insufficient documentation

## 2017-02-10 DIAGNOSIS — S82302A Unspecified fracture of lower end of left tibia, initial encounter for closed fracture: Secondary | ICD-10-CM | POA: Diagnosis not present

## 2017-02-10 DIAGNOSIS — Z87891 Personal history of nicotine dependence: Secondary | ICD-10-CM | POA: Insufficient documentation

## 2017-02-10 DIAGNOSIS — M791 Myalgia: Secondary | ICD-10-CM | POA: Insufficient documentation

## 2017-02-10 DIAGNOSIS — C3411 Malignant neoplasm of upper lobe, right bronchus or lung: Secondary | ICD-10-CM

## 2017-02-10 DIAGNOSIS — C3491 Malignant neoplasm of unspecified part of right bronchus or lung: Secondary | ICD-10-CM

## 2017-02-10 DIAGNOSIS — R52 Pain, unspecified: Secondary | ICD-10-CM

## 2017-02-10 DIAGNOSIS — L270 Generalized skin eruption due to drugs and medicaments taken internally: Secondary | ICD-10-CM

## 2017-02-10 DIAGNOSIS — M79672 Pain in left foot: Secondary | ICD-10-CM

## 2017-02-10 DIAGNOSIS — Y929 Unspecified place or not applicable: Secondary | ICD-10-CM | POA: Diagnosis not present

## 2017-02-10 DIAGNOSIS — M79662 Pain in left lower leg: Secondary | ICD-10-CM | POA: Diagnosis present

## 2017-02-10 DIAGNOSIS — Y998 Other external cause status: Secondary | ICD-10-CM | POA: Diagnosis not present

## 2017-02-10 DIAGNOSIS — W19XXXA Unspecified fall, initial encounter: Secondary | ICD-10-CM | POA: Diagnosis not present

## 2017-02-10 HISTORY — DX: Pain in left foot: M79.672

## 2017-02-10 HISTORY — DX: Generalized skin eruption due to drugs and medicaments taken internally: L27.0

## 2017-02-10 LAB — COMPREHENSIVE METABOLIC PANEL
ALBUMIN: 2 g/dL — AB (ref 3.5–5.0)
ALK PHOS: 306 U/L — AB (ref 40–150)
ALT: 17 U/L (ref 0–55)
ANION GAP: 9 meq/L (ref 3–11)
AST: 34 U/L (ref 5–34)
BILIRUBIN TOTAL: 0.64 mg/dL (ref 0.20–1.20)
BUN: 24 mg/dL (ref 7.0–26.0)
CO2: 23 mEq/L (ref 22–29)
CREATININE: 1.2 mg/dL — AB (ref 0.6–1.1)
Calcium: 9.1 mg/dL (ref 8.4–10.4)
Chloride: 109 mEq/L (ref 98–109)
EGFR: 47 mL/min/{1.73_m2} — AB (ref >90)
Glucose: 96 mg/dl (ref 70–140)
Potassium: 4.3 mEq/L (ref 3.5–5.1)
Sodium: 141 mEq/L (ref 136–145)
TOTAL PROTEIN: 6.7 g/dL (ref 6.4–8.3)

## 2017-02-10 LAB — CBC WITH DIFFERENTIAL/PLATELET
BASO%: 0.2 % (ref 0.0–2.0)
BASOS ABS: 0 10*3/uL (ref 0.0–0.1)
BASOS ABS: 0 10*3/uL (ref 0.0–0.1)
BASOS PCT: 0 %
EOS ABS: 0.1 10*3/uL (ref 0.0–0.5)
EOS ABS: 0.1 10*3/uL (ref 0.0–0.7)
EOS%: 1.1 % (ref 0.0–7.0)
Eosinophils Relative: 1 %
HEMATOCRIT: 36.1 % (ref 34.8–46.6)
HEMATOCRIT: 37 % (ref 36.0–46.0)
HEMOGLOBIN: 11.2 g/dL — AB (ref 11.6–15.9)
HEMOGLOBIN: 11.7 g/dL — AB (ref 12.0–15.0)
LYMPH#: 1.3 10*3/uL (ref 0.9–3.3)
LYMPH%: 16 % (ref 14.0–49.7)
Lymphocytes Relative: 19 %
Lymphs Abs: 1.6 10*3/uL (ref 0.7–4.0)
MCH: 30.8 pg (ref 25.1–34.0)
MCH: 30.9 pg (ref 26.0–34.0)
MCHC: 31 g/dL — ABNORMAL LOW (ref 31.5–36.0)
MCHC: 31.6 g/dL (ref 30.0–36.0)
MCV: 99.2 fL (ref 79.5–101.0)
MCV: 97.6 fL (ref 78.0–100.0)
MONO ABS: 0.7 10*3/uL (ref 0.1–1.0)
MONO#: 0.7 10*3/uL (ref 0.1–0.9)
MONO%: 8 % (ref 0.0–14.0)
Monocytes Relative: 8 %
NEUT#: 6.2 10*3/uL (ref 1.5–6.5)
NEUT%: 74.7 % (ref 38.4–76.8)
NEUTROS ABS: 6 10*3/uL (ref 1.7–7.7)
NEUTROS PCT: 72 %
NRBC: 1 % — AB (ref 0–0)
PLATELETS: 392 10*3/uL (ref 145–400)
Platelets: 434 10*3/uL — ABNORMAL HIGH (ref 150–400)
RBC: 3.64 10*6/uL — ABNORMAL LOW (ref 3.70–5.45)
RBC: 3.79 MIL/uL — ABNORMAL LOW (ref 3.87–5.11)
RDW: 16.8 % — AB (ref 11.2–14.5)
RDW: 17 % — AB (ref 11.5–15.5)
WBC: 8.3 10*3/uL (ref 3.9–10.3)
WBC: 8.4 10*3/uL (ref 4.0–10.5)

## 2017-02-10 LAB — BASIC METABOLIC PANEL
Anion gap: 8 (ref 5–15)
BUN: 26 mg/dL — AB (ref 6–20)
CALCIUM: 8.5 mg/dL — AB (ref 8.9–10.3)
CO2: 25 mmol/L (ref 22–32)
Chloride: 109 mmol/L (ref 101–111)
Creatinine, Ser: 1.33 mg/dL — ABNORMAL HIGH (ref 0.44–1.00)
GFR calc Af Amer: 41 mL/min — ABNORMAL LOW (ref >60)
GFR, EST NON AFRICAN AMERICAN: 35 mL/min — AB (ref >60)
GLUCOSE: 118 mg/dL — AB (ref 65–99)
POTASSIUM: 4 mmol/L (ref 3.5–5.1)
SODIUM: 142 mmol/L (ref 135–145)

## 2017-02-10 MED ORDER — HYDROCODONE-ACETAMINOPHEN 5-325 MG PO TABS: 1.0000 | ORAL_TABLET | Freq: Four times a day (QID) | ORAL | 0 refills | Status: AC | PRN

## 2017-02-10 MED ORDER — NIVOLUMAB CHEMO INJECTION 100 MG/10ML
240.0000 mg | Freq: Once | INTRAVENOUS | Status: AC
  Administered 2017-02-10: 240 mg via INTRAVENOUS
  Filled 2017-02-10: qty 24

## 2017-02-10 MED ORDER — HYDROCODONE-ACETAMINOPHEN 5-325 MG PO TABS
1.0000 | ORAL_TABLET | Freq: Once | ORAL | Status: AC
  Administered 2017-02-10: 1 via ORAL
  Filled 2017-02-10: qty 1

## 2017-02-10 MED ORDER — SODIUM CHLORIDE 0.9 % IV SOLN
Freq: Once | INTRAVENOUS | Status: AC
  Administered 2017-02-10: 13:00:00 via INTRAVENOUS

## 2017-02-10 NOTE — ED Provider Notes (Signed)
Medical screening examination/treatment/procedure(s) were conducted as a shared visit with non-physician practitioner(s) and myself.  I personally evaluated the patient during the encounter.   EKG Interpretation None       Results for orders placed or performed during the hospital encounter of 02/10/17  CBC with Differential  Result Value Ref Range   WBC 8.4 4.0 - 10.5 K/uL   RBC 3.79 (L) 3.87 - 5.11 MIL/uL   Hemoglobin 11.7 (L) 12.0 - 15.0 g/dL   HCT 37.0 36.0 - 46.0 %   MCV 97.6 78.0 - 100.0 fL   MCH 30.9 26.0 - 34.0 pg   MCHC 31.6 30.0 - 36.0 g/dL   RDW 17.0 (H) 11.5 - 15.5 %   Platelets 434 (H) 150 - 400 K/uL   Neutrophils Relative % 72 %   Neutro Abs 6.0 1.7 - 7.7 K/uL   Lymphocytes Relative 19 %   Lymphs Abs 1.6 0.7 - 4.0 K/uL   Monocytes Relative 8 %   Monocytes Absolute 0.7 0.1 - 1.0 K/uL   Eosinophils Relative 1 %   Eosinophils Absolute 0.1 0.0 - 0.7 K/uL   Basophils Relative 0 %   Basophils Absolute 0.0 0.0 - 0.1 K/uL  Basic metabolic panel  Result Value Ref Range   Sodium 142 135 - 145 mmol/L   Potassium 4.0 3.5 - 5.1 mmol/L   Chloride 109 101 - 111 mmol/L   CO2 25 22 - 32 mmol/L   Glucose, Bld 118 (H) 65 - 99 mg/dL   BUN 26 (H) 6 - 20 mg/dL   Creatinine, Ser 1.33 (H) 0.44 - 1.00 mg/dL   Calcium 8.5 (L) 8.9 - 10.3 mg/dL   GFR calc non Af Amer 35 (L) >60 mL/min   GFR calc Af Amer 41 (L) >60 mL/min   Anion gap 8 5 - 15   Dg Tibia/fibula Left  Result Date: 02/10/2017 CLINICAL DATA:  Two weeks of left lower leg pain after having her foot becoming caught in a wheelchair and bending the leg. Patient reports pain only with weight-bearing. EXAM: LEFT TIBIA AND FIBULA - 2 VIEW COMPARISON:  Left ankle series of today's date FINDINGS: There is an acute comminuted angulated and distracted fracture of the distal left tibial metadiaphysis. There is a spiral fracture through the proximal third of the left fibula exhibiting mild displacement. No distal fibular fracture or  proximal tibial fracture is observed. There are degenerative changes centered on the lateral compartment of the left knee. There are mild degenerative changes of the tibiotalar joint. IMPRESSION: There is an acute comminuted displaced and angulated fracture of the distal left tibial metadiaphysis. There is a spiral displaced fracture of the proximal fibular shaft. Electronically Signed   By: David  Martinique M.D.   On: 02/10/2017 15:50   Dg Ankle 2 Views Left  Result Date: 02/10/2017 CLINICAL DATA:  Lower leg pain for several weeks following fall, initial encounter EXAM: LEFT ANKLE - 2 VIEW COMPARISON:  10/03/2007 FINDINGS: Comminuted fracture of the distal tibial metaphysis is noted with some impaction at the fracture site. No definitive fibular fractures seen. Degenerative changes in the foot are noted. IMPRESSION: Comminuted distal tibial fracture Electronically Signed   By: Inez Catalina M.D.   On: 02/10/2017 15:05   Dg Foot 2 Views Left  Result Date: 02/10/2017 CLINICAL DATA:  Fall 2 weeks ago with persistent pain common known tibial fracture, initial encounter EXAM: LEFT FOOT - 2 VIEW COMPARISON:  None. FINDINGS: Mild degenerative changes are noted in the  interphalangeal joints. There changes suggestive of prior metatarsal head fractures of the third fourth and fifth metatarsals. Some increased sclerosis is noted in the fifth metatarsal head consistent with some healing. The known tibial fracture is again noted. No other focal abnormality is seen. IMPRESSION: Subacute metatarsal head fractures. Electronically Signed   By: Inez Catalina M.D.   On: 02/10/2017 15:06     Patient status post injury about a week ago. Patient seen by me along with the physician assistant. And actually it may have been as much as 2 weeks ago. Patient got her foot caught under her wheelchair bent her left leg. Patient is from a nursing facility. Was brought in by family member. Patient is also followed by the hematology oncology  clinic for cancer. X-rays show distal tibia and proximal fibula fracture. It is closed fracture. Patient will need a stirrup type splint and follow-up with orthopedics. Will be not nonweightbearing. Distally the foot had reasonable Refill at about the 2 seconds or less.  Correction we thought the patient was in a nursing facility. But patient is actually at home with the family caretaker.  No other apparent acute injuries.   Fredia Sorrow, MD 02/10/17 [phone]

## 2017-02-10 NOTE — ED Notes (Signed)
Pt to imaging

## 2017-02-10 NOTE — Discharge Instructions (Signed)
Please read attached information regarding your condition and splint care. Take pain medication as needed for severe pain. Follow-up with orthopedist for further evaluation listed below. Remain nonweightbearing until evaluated by orthopedist. Return to ED for worsening pain, increased swelling, loss of consciousness, falls, trouble breathing, numbness.

## 2017-02-10 NOTE — Telephone Encounter (Signed)
Scheduled appt per 8/22 los - patient to get new schedule after treatment.

## 2017-02-10 NOTE — ED Provider Notes (Signed)
[place] DEPT Provider Note   CSN: [id number] Arrival date & time: 02/10/17  1354     History   Chief Complaint Chief Complaint  Patient presents with  . Leg Pain    HPI Beverly Lewis is a 83 y.o. female.  HPI  Patient, with past medical history of lung cancer, CVA, diabetes, presents to ED for evaluation of left lower leg pain for the past 2 weeks. She states that when she was at her appointment at the cancer center 2 weeks ago her leg got caught under a wheelchair. Since then she has had pain with weightbearing on the left side. She also states that her leg has looked more swollen then usual. Her palliative radiotherapy for her RUL mass was completed Sept 2013.  Her chemotherapy was discontinued secondary to intolerance approximately one year ago after 6 cycles.  Past Medical History:  Diagnosis Date  . AAA (abdominal aortic aneurysm) ([place])   . Arthritis   . Cerebrovascular accident [facility]) history of mild cerebrovascular accident which caused some visual disturbance.   . Chronic fatigue 12/15/2016  . Cough with hemoptysis 01/22/12   Prior to discharge  . Diabetes mellitus   . Drug-induced skin rash 02/10/2017  . GERD (gastroesophageal reflux disease)   . History of radiation therapy 02/18/12,02/23/12,02/25/12., 03/01/12,&03/03/12   RULlung 50Gy/5/fx  . Hyperlipidemia   . Hypertension   . Left foot pain 02/10/2017  . Lung cancer ([place]) 01/21/12  . Lung mass    Right upper Lobe  . Morbid obesity ([place])   . Peripheral vascular disease ([facility])    hx blood clot 30 yrs ago leg  . Pneumonia    hx  . Pneumothorax, iatrogenic 8/2/113-discharge note   Post Biospy  . Shortness of breath   . Tachycardia 01/22/12   Prior to discharge    Patient Active Problem List   Diagnosis Date Noted  . Left foot pain 02/10/2017  . Drug-induced skin rash 02/10/2017  . Left ankle pain 01/29/2017  . Encounter for antineoplastic immunotherapy 01/13/2017  . Acute on chronic respiratory failure  with hypoxia ([place]) 12/23/2016  . COPD exacerbation ([place]) 12/21/2016  . Chronic fatigue 12/15/2016  . PAD (peripheral artery disease) ([place]) 10/01/2015  . Essential hypertension, benign 02/18/2015  . GERD without esophagitis 02/18/2015  . Hypokalemia 02/18/2015  . Chronic gout of multiple sites 02/18/2015  . Dyslipidemia 02/18/2015  . Type II diabetes mellitus with peripheral circulatory disorder ([place]) 02/18/2015  . Morbid obesity ([place]) 02/13/2015  . Erythema multiforme bullosum ([place]) 02/13/2015  . Anemia 02/12/2015  . Blister of lower leg 02/12/2015  . Pressure ulcer 02/12/2015  . Antineoplastic chemotherapy induced anemia 01/23/2015  . Encounter for antineoplastic chemotherapy 12/18/2014  . Sacral decubitus ulcer 09/28/2014  . Cellulitis of groin 09/28/2014  . Dehydration 09/28/2014  . Renal failure (ARF), acute on chronic (HCC) 09/28/2014  . Fracture of left humerus 09/28/2014  . Fungal infection of the groin 09/28/2014  . Fall at home 09/28/2014  . Failure to thrive in adult 09/28/2014  . Bilateral lower extremity edema 06/04/2014  . Lung mass 01/22/2012  . Non-small cell carcinoma of lung, stage 3 ([place]) 01/21/2012  . Abdominal aortic aneurysm ([place]) 09/29/2011    Past Surgical History:  Procedure Laterality Date  . ABDOMINAL AORTIC ANEURYSM REPAIR  06/28/10   Stent Graft repair    . Aortogram   03/03/11   with stenting , left external iliac artery  . APPENDECTOMY    . EXPLORATORY LAPAROTOMY  For ectopic pregnancy  . LUMBAR DISC SURGERY    . Navigational Bronchoscopy     Right Upper Lobe Mass with Biopsies, Brushings, Washings, and bronchoalveolar Lavage:  . Paratracheal Adenopathy and Bilateral Adrenal Nodules      OB History    No data available       Home Medications    Prior to Admission medications   Medication Sig Start Date End Date Taking? Authorizing Provider  acetaminophen (TYLENOL) 500 MG tablet Take 500 mg by mouth every 6 (six) hours as needed  for moderate pain (inflammation).   Yes [provider]  albuterol (PROVENTIL HFA;VENTOLIN HFA) 108 (90 Base) MCG/ACT inhaler Inhale 2 puffs into the lungs every 6 (six) hours as needed for wheezing or shortness of breath. 12/25/16  Yes Charlynne Cousins, MD  allopurinol (ZYLOPRIM) 100 MG tablet Take 100 mg by mouth daily.    Yes [provider]  aspirin EC 81 MG tablet Take 81 mg by mouth daily.   Yes [provider]  atorvastatin (LIPITOR) 20 MG tablet Take 20 mg by mouth daily.     Yes [provider]  gabapentin (NEURONTIN) 300 MG capsule Take 300 mg by mouth 3 (three) times daily. As directed 05/23/15  Yes [provider]  JANUVIA 100 MG tablet Take 100 mg by mouth daily.  03/11/11  Yes [provider]  pantoprazole (PROTONIX) 40 MG tablet Take 40 mg by mouth daily.    Yes [provider]  docusate sodium (COLACE) 100 MG capsule Take 1 capsule (100 mg total) by mouth every 12 (twelve) hours. Patient not taking: Reported on 02/10/2017 12/25/16   Charlynne Cousins, MD  HYDROcodone-acetaminophen (NORCO/VICODIN) 5-325 MG tablet Take 1 tablet by mouth every 6 (six) hours as needed for severe pain. 02/10/17   Delia Heady, PA-C    Family History Family History  Problem Relation Age of Onset  . Heart disease Mother   . Hypertension Mother   . Diabetes Mother   . Hyperlipidemia Mother   . Heart disease Father   . Hypertension Father   . Diabetes Father   . Hyperlipidemia Father   . Diabetes Daughter     Social History Social History  Substance Use Topics  . Smoking status: Former Smoker    Packs/day: 0.50    Years: 65.00    Types: Cigarettes    Quit date: 02/20/2016  . Smokeless tobacco: Never Used  . Alcohol use No     Comment: She does not consume alcohol on a regular basis.     Allergies   Asa buff (mag [buffered aspirin]   Review of Systems Review of Systems  Constitutional: Negative for appetite change,  chills and fever.  HENT: Negative for ear pain, rhinorrhea, sneezing and sore throat.   Eyes: Negative for photophobia and visual disturbance.  Respiratory: Negative for cough, chest tightness, shortness of breath and wheezing.   Cardiovascular: Positive for leg swelling. Negative for chest pain and palpitations.  Gastrointestinal: Positive for nausea. Negative for abdominal pain, blood in stool, constipation, diarrhea and vomiting.  Genitourinary: Negative for dysuria, hematuria and urgency.  Musculoskeletal: Positive for arthralgias, gait problem and myalgias.  Skin: Positive for color change. Negative for rash.  Neurological: Negative for dizziness, weakness and light-headedness.     Physical Exam Updated Vital Signs BP (!) 150/90   Pulse 95   Temp (!) 97.5 F (36.4 C)   Resp 18   Ht 5\' 3"  (1.6 m)   Wt  89.4 kg (197 lb)   SpO2 95%   BMI 34.90 kg/m   Physical Exam  Constitutional: She appears well-developed and well-nourished. No distress.  HENT:  Head: Normocephalic and atraumatic.  Nose: Nose normal.  Eyes: Conjunctivae and EOM are normal. Left eye exhibits no discharge. No scleral icterus.  Neck: Normal range of motion. Neck supple.  Cardiovascular: Normal rate, regular rhythm, normal heart sounds and intact distal pulses.  Exam reveals no gallop and no friction rub.   No murmur heard. Pulmonary/Chest: Effort normal and breath sounds normal. No respiratory distress.  Abdominal: Soft. Bowel sounds are normal. She exhibits no distension. There is no tenderness. There is no guarding.  Musculoskeletal: Normal range of motion. She exhibits edema and tenderness.  Lower extremity edema L > R. tenderness to palpation in the lateral and medial left ankle. Chronic ulceration in BLE. Sensation intact to light touch. Strength 5/5 in bilateral lower extremities.  Neurological: She is alert. She exhibits normal muscle tone. Coordination normal.  Skin: Skin is warm and dry. Rash noted.    Well-healing chronic superficial skin abrasion on the right lower back with no associated necrosis or drainage or bleeding noted.  Psychiatric: She has a normal mood and affect.  Nursing note and vitals reviewed.    ED Treatments / Results  Labs (all labs ordered are listed, but only abnormal results are displayed) Labs Reviewed  CBC WITH DIFFERENTIAL/PLATELET - Abnormal; Notable for the following:       Result Value   RBC 3.79 (*)    Hemoglobin 11.7 (*)    RDW 17.0 (*)    Platelets 434 (*)    All other components within normal limits  BASIC METABOLIC PANEL - Abnormal; Notable for the following:    Glucose, Bld 118 (*)    BUN 26 (*)    Creatinine, Ser 1.33 (*)    Calcium 8.5 (*)    GFR calc non Af Amer 35 (*)    GFR calc Af Amer 41 (*)    All other components within normal limits    EKG  EKG Interpretation None       Radiology Dg Tibia/fibula Left  Result Date: 02/10/2017 CLINICAL DATA:  Two weeks of left lower leg pain after having her foot becoming caught in a wheelchair and bending the leg. Patient reports pain only with weight-bearing. EXAM: LEFT TIBIA AND FIBULA - 2 VIEW COMPARISON:  Left ankle series of today's date FINDINGS: There is an acute comminuted angulated and distracted fracture of the distal left tibial metadiaphysis. There is a spiral fracture through the proximal third of the left fibula exhibiting mild displacement. No distal fibular fracture or proximal tibial fracture is observed. There are degenerative changes centered on the lateral compartment of the left knee. There are mild degenerative changes of the tibiotalar joint. IMPRESSION: There is an acute comminuted displaced and angulated fracture of the distal left tibial metadiaphysis. There is a spiral displaced fracture of the proximal fibular shaft. Electronically Signed   By: David  Martinique M.D.   On: 02/10/2017 15:50   Dg Ankle 2 Views Left  Result Date: 02/10/2017 CLINICAL DATA:  Lower leg pain  for several weeks following fall, initial encounter EXAM: LEFT ANKLE - 2 VIEW COMPARISON:  10/03/2007 FINDINGS: Comminuted fracture of the distal tibial metaphysis is noted with some impaction at the fracture site. No definitive fibular fractures seen. Degenerative changes in the foot are noted. IMPRESSION: Comminuted distal tibial fracture Electronically Signed   By: Elta Guadeloupe  Lukens M.D.   On: 02/10/2017 15:05   Dg Foot 2 Views Left  Result Date: 02/10/2017 CLINICAL DATA:  Fall 2 weeks ago with persistent pain common known tibial fracture, initial encounter EXAM: LEFT FOOT - 2 VIEW COMPARISON:  None. FINDINGS: Mild degenerative changes are noted in the interphalangeal joints. There changes suggestive of prior metatarsal head fractures of the third fourth and fifth metatarsals. Some increased sclerosis is noted in the fifth metatarsal head consistent with some healing. The known tibial fracture is again noted. No other focal abnormality is seen. IMPRESSION: Subacute metatarsal head fractures. Electronically Signed   By: Inez Catalina M.D.   On: 02/10/2017 15:06    Procedures Procedures (including critical care time)  Medications Ordered in ED Medications  HYDROcodone-acetaminophen (NORCO/VICODIN) 5-325 MG per tablet 1 tablet (1 tablet Oral Given 02/10/17 1711)     Initial Impression / Assessment and Plan / ED Course  I have reviewed the triage vital signs and the nursing notes.  Pertinent labs & imaging results that were available during my care of the patient were reviewed by me and considered in my medical decision making (see chart for details).     Patient presents to ED for evaluation of left leg pain for the past 2 weeks. She states that she was at the cancer center when her leg got caught under a wheelchair. She has not been able to bear weight since the incident occurred. On physical exam there is tenderness and edema present in the left lower extremity. There is also chronic ulcers  present. Sensation intact with normal pulses. X-rays of leg showed comminuted distal tibial fracture and a spiral displaced fracture of the proximal or fibular shaft. This is a closed fracture. Ultrasound was initially ordered to evaluate for DVT but was canceled after x-ray findings. Patient states that she lives at home with family caretaker. She states that she does have a wheelchair. We will apply a stirrup splint, given pain medication and advised patient to follow up with orthopedist for further evaluation. She has this chronic while hearing skin abrasion on the right lower back with no necrosis or drainage noted. Advised patient to continue home medications as previously prescribed. Patient appears stable for discharge at this time. She'll return precautions given.  Patient discussed with and seen by Dr. Rogene Houston.   Final Clinical Impressions(s) / ED Diagnoses   Final diagnoses:  Closed fracture of distal end of left tibia, unspecified fracture morphology, initial encounter  Closed fracture of proximal end of left fibula, unspecified fracture morphology, initial encounter    New Prescriptions New Prescriptions   HYDROCODONE-ACETAMINOPHEN (NORCO/VICODIN) 5-325 MG TABLET    Take 1 tablet by mouth every 6 (six) hours as needed for severe pain.     Delia Heady, PA-C 02/10/17 1727    Fredia Sorrow, MD 02/15/17 [phone]

## 2017-02-10 NOTE — Progress Notes (Signed)
Patient was identified to be at risk for malnutrition on the MST secondary to weight to weight loss and poor appetite.  83 year old female diagnosed with non-small cell lung cancer.  She is a patient of Dr. Julien Nordmann.  Past medical history includes COPD, AAA, CVA, diabetes, GERD, hyperlipidemia, hypertension, morbid obesity and PVD.  Medications include Lipitor, vitamin D, Colace, Januvia, Protonix, prednisone.  Labs include albumin 2.3 on August 10.  Height: 5 foot 3 inches. Weight: 197 pounds July 2. Usual body weight: 230 pounds. BMI: 34.95.  Met with patient and caregiver, during immunotherapy. Patient reports poor appetite and nausea however denies vomiting. She reports she does have some diarrhea but no constipation. States she is afraid to drink ensure because she doesn't tolerate milk. Patient has someone who helps prepare food Caregiver reports sacral ulcer has improved.  Nutrition diagnosis:  Unintended weight loss related to non-small cell lung cancer and associated treatments as evidenced by 33 pound weight loss from usual body weight.  Intervention: Patient educated to consume high-calorie high-protein foods during small frequent meals and snacks.  Fact sheet was provided Educated patient to try ensure enlive and try to drink 2 bottles daily. Patient was given a sample and coupons. Educated patient on strategies for improving appetite and provided fact sheet. Questions were answered.  Teach back method used.  Contact information given.  Monitoring, evaluation, goals: Patient will tolerate increased calories and protein to maintain weight and tolerate treatment.  Next visit: To be scheduled as needed.  **Disclaimer: This note was dictated with voice recognition software. Similar sounding words can inadvertently be transcribed and this note may contain transcription errors which may not have been corrected upon publication of note.**

## 2017-02-10 NOTE — Patient Instructions (Signed)
[facility] Discharge Instructions for Patients Receiving Chemotherapy  Today you received the following chemotherapy agents:  Nivolumab.  To help prevent nausea and vomiting after your treatment, we encourage you to take your nausea medication as directed.   If you develop nausea and vomiting that is not controlled by your nausea medication, call the clinic.   BELOW ARE SYMPTOMS THAT SHOULD BE REPORTED IMMEDIATELY:  *FEVER GREATER THAN 100.5 F  *CHILLS WITH OR WITHOUT FEVER  NAUSEA AND VOMITING THAT IS NOT CONTROLLED WITH YOUR NAUSEA MEDICATION  *UNUSUAL SHORTNESS OF BREATH  *UNUSUAL BRUISING OR BLEEDING  TENDERNESS IN MOUTH AND THROAT WITH OR WITHOUT PRESENCE OF ULCERS  *URINARY PROBLEMS  *BOWEL PROBLEMS  UNUSUAL RASH Items with * indicate a potential emergency and should be followed up as soon as possible.  Feel free to call the clinic you have any questions or concerns. The clinic phone number is [phone].  Please show the CHEMO ALERT CARD at check-in to the Emergency Department and triage nurse.   

## 2017-02-10 NOTE — Progress Notes (Signed)
[place] [phone]   [phone]  OFFICE PROGRESS NOTE  Nolene Ebbs, MD [place] [address]  DIAGNOSIS: Lung cancer  Primary site: Lung (Right)  Staging method: AJCC 7th Edition  Clinical: Stage IIIA (T3, N2, M0) signed by Curt Bears, MD on 01/01/2014 6:57 PM  Summary: Stage IIIA (T3, N2, M0)   PRIOR THERAPY:  1) palliative radiotherapy to the right upper lobe lung mass under the care of Dr. Pablo Ledger completed September 2013. 2) Systemic chemotherapy with carboplatin for an AUC of 4 and Alimta 400 mg/m2 given in 3 weeks. Status post 6 cycles, last dose was given 05/07/2014 with partial response.  3) Systemic chemotherapy with single agent Alimta 400 MG/M2 every 3 weeks. First dose given 09/17/2014. Status post 6 cycles, last dose was given on 02/06/2015 discontinued secondary to intolerance.   CURRENT THERAPY: Second line treatment with immunotherapy with Nivolumab 240 mg IV every 2 weeks first dose 12/30/2016. Status post 3 cycles.  INTERVAL HISTORY: Beverly Lewis 83 y.o. female returns to the clinic today for follow-up visit accompanied by family member. The patient is feeling fine today with no specific complaints except for few skin lesions in the scalp started last week. She denied having any chest pain or shortness breath. Her cough and hemoptysis has significantly improved after starting this treatment. She denied having any fever or chills. She has no nausea, vomiting, diarrhea or constipation. She had a recent fall and trap of her left foot at the wheelchair at the wound care clinic. She is requesting x-ray of her left foot to rule out any fractures. She denied having any other significant complaints and has been tolerating her treatment with Nivolumab fairly well.   MEDICAL HISTORY: Past Medical History:  Diagnosis Date  . AAA (abdominal aortic aneurysm) ([place])   . Arthritis   .  Cerebrovascular accident [facility]) history of mild cerebrovascular accident which caused some visual disturbance.   . Chronic fatigue 12/15/2016  . Cough with hemoptysis 01/22/12   Prior to discharge  . Diabetes mellitus   . GERD (gastroesophageal reflux disease)   . History of radiation therapy 02/18/12,02/23/12,02/25/12., 03/01/12,&03/03/12   RULlung 50Gy/5/fx  . Hyperlipidemia   . Hypertension   . Lung cancer ([place]) 01/21/12  . Lung mass    Right upper Lobe  . Morbid obesity ([place])   . Peripheral vascular disease ([facility])    hx blood clot 30 yrs ago leg  . Pneumonia    hx  . Pneumothorax, iatrogenic 8/2/113-discharge note   Post Biospy  . Shortness of breath   . Tachycardia 01/22/12   Prior to discharge    ALLERGIES:  is allergic to asa buff (mag [buffered aspirin].  MEDICATIONS:  Current Outpatient Prescriptions  Medication Sig Dispense Refill  . acetaminophen (TYLENOL) 500 MG tablet Take 500 mg by mouth every 6 (six) hours as needed for moderate pain (inflammation).    Marland Kitchen albuterol (PROVENTIL HFA;VENTOLIN HFA) 108 (90 Base) MCG/ACT inhaler Inhale 2 puffs into the lungs every 6 (six) hours as needed for wheezing or shortness of breath. 1 Inhaler 2  . allopurinol (ZYLOPRIM) 100 MG tablet Take 100 mg by mouth daily.     Marland Kitchen aspirin EC 81 MG tablet Take 81 mg by mouth daily.    Marland Kitchen atorvastatin (LIPITOR) 20 MG tablet Take 20 mg by mouth daily.      . cholecalciferol (VITAMIN D) 1000 UNITS tablet Take 1,000 Units by mouth daily.    Marland Kitchen  docusate sodium (COLACE) 100 MG capsule Take 1 capsule (100 mg total) by mouth every 12 (twelve) hours. 60 capsule 0  . gabapentin (NEURONTIN) 300 MG capsule Take 300 mg by mouth 3 (three) times daily. As directed  5  . JANUVIA 100 MG tablet Take 100 mg by mouth daily.     . pantoprazole (PROTONIX) 40 MG tablet Take 40 mg by mouth daily.      No current facility-administered medications for this visit.     SURGICAL HISTORY:  Past Surgical History:  Procedure Laterality  Date  . ABDOMINAL AORTIC ANEURYSM REPAIR  06/28/10   Stent Graft repair    . Aortogram   03/03/11   with stenting , left external iliac artery  . APPENDECTOMY    . EXPLORATORY LAPAROTOMY     For ectopic pregnancy  . LUMBAR DISC SURGERY    . Navigational Bronchoscopy     Right Upper Lobe Mass with Biopsies, Brushings, Washings, and bronchoalveolar Lavage:  . Paratracheal Adenopathy and Bilateral Adrenal Nodules      REVIEW OF SYSTEMS:  Constitutional: positive for fatigue Eyes: negative Ears, nose, mouth, throat, and face: negative Respiratory: positive for dyspnea on exertion Cardiovascular: negative Gastrointestinal: negative Genitourinary:negative Integument/breast: negative Hematologic/lymphatic: negative Musculoskeletal:positive for bone pain and muscle weakness Neurological: negative Behavioral/Psych: negative Endocrine: negative Allergic/Immunologic: negative   PHYSICAL EXAMINATION: General appearance: alert, cooperative, fatigued and no distress Head: Normocephalic, without obvious abnormality, atraumatic Neck: no adenopathy, no JVD, supple, symmetrical, trachea midline and thyroid not enlarged, symmetric, no tenderness/mass/nodules Lymph nodes: Cervical, supraclavicular, and axillary nodes normal. Resp: clear to auscultation bilaterally Back: symmetric, no curvature. ROM normal. No CVA tenderness. Cardio: regular rate and rhythm, S1, S2 normal, no murmur, click, rub or gallop GI: soft, non-tender; bowel sounds normal; no masses,  no organomegaly Extremities: edema And pain in the left foot Neurologic: Alert and oriented X 3, normal strength and tone. Normal symmetric reflexes. Normal coordination and gait  ECOG PERFORMANCE STATUS: 2 - Symptomatic, <50% confined to bed  Blood pressure (!) 99/48, pulse 63, temperature 99 F (37.2 C), temperature source Oral, resp. rate 19, height 5\' 3"  (1.6 m).  LABORATORY DATA: Lab Results  Component Value Date   WBC 8.3  02/10/2017   HGB 11.2 (L) 02/10/2017   HCT 36.1 02/10/2017   MCV 99.2 02/10/2017   PLT 392 02/10/2017      Chemistry      Component Value Date/Time   NA 142 01/29/2017 1125   K 4.3 01/29/2017 1125   CL 100 (L) 12/25/2016 0537   CO2 26 01/29/2017 1125   BUN 21.6 01/29/2017 1125   CREATININE 1.1 01/29/2017 1125      Component Value Date/Time   CALCIUM 9.4 01/29/2017 1125   ALKPHOS 252 (H) 01/29/2017 1125   AST 20 01/29/2017 1125   ALT 14 01/29/2017 1125   BILITOT 0.63 01/29/2017 1125       RADIOGRAPHIC STUDIES: No results found. ASSESSMENT AND PLAN:  This is a very pleasant 83 years old African-American female with recurrent non-small cell lung cancer, adenocarcinoma status post systemic chemotherapy with carboplatin and Alimta discontinued secondary to intolerance.  She hasn't observation for more than a year. Recent imaging studies in April 2018 showed evidence for disease progression with interval growth of locally recurrent central right upper lobe lung mass and significant progression of bilateral pulmonary metastasis. There was also enlarging of left adrenal mass suspicious for adrenal metastasis. The patient is currently undergoing treatment with immunotherapy with Nivolumab status  post 3 cycles and has been tolerating her treatment well. I recommended for her to proceed with cycle #4 today as scheduled. I will see her back for follow-up visit in 2 weeks for evaluation after repeating CT scan of the chest for restaging of her disease. For the scalp skin lesion, this could be secondary to treatment with Nivolumab. We will continue to monitor them closely for now. For the patient left foot injury, I will order x-ray of the left foot to rule out any fracture. She was advised to call immediately if she has any other concerning symptoms in the interval. The patient voices understanding of current disease status and treatment options and is in agreement with the current care  plan. All questions were answered. The patient knows to call the clinic with any problems, questions or concerns. We can certainly see the patient much sooner if necessary.   Disclaimer: This note was dictated with voice recognition software. Similar sounding words can inadvertently be transcribed and may not be corrected upon review.

## 2017-02-10 NOTE — ED Notes (Signed)
Pt asked about mid/lower R back wound.  PA Hina viewed it, pt will follow up with continued home cream medication use and follow up with oncologist.  No bleeding or obvious signs of infection.  PT A&Ox4.  Denies any questions w/discharge instructions.

## 2017-02-10 NOTE — ED Triage Notes (Addendum)
Patient c/o left lower leg pain x 2 weeks. Patient states that her foot got caught under the wheelchair and bent her left leg under 2 weeks ago. Patient states she only has pain with weight bearing.

## 2017-02-16 ENCOUNTER — Telehealth: Payer: Self-pay | Admitting: *Deleted

## 2017-02-16 NOTE — Telephone Encounter (Signed)
Oncology Nurse Navigator Documentation  Oncology Nurse Navigator Flowsheets 02/16/2017  Navigator Location [facility]  Navigator Encounter Type Telephone/I followed up on Beverly Lewis appts.  According to Dr. Worthy Flank note, he would like her to have CT Chest before seeing her on 02/24/17.  I called and spoke family, Beverly Lewis (on release of information).  I updated him on need for CT scan and gave them a number to call and schedule before 9/5.  He verbalized understanding.   Telephone Outgoing Call  Treatment Phase Treatment  Barriers/Navigation Needs Coordination of Care  Interventions Coordination of Care  Coordination of Care Other  Acuity Level 1  Time Spent with Patient 30

## 2017-02-23 ENCOUNTER — Ambulatory Visit (HOSPITAL_COMMUNITY): Admission: RE | Admit: 2017-02-23 | Payer: Medicare Other | Source: Ambulatory Visit

## 2017-02-24 ENCOUNTER — Ambulatory Visit: Payer: Medicare Other

## 2017-02-24 ENCOUNTER — Ambulatory Visit: Payer: Medicare Other | Admitting: Internal Medicine

## 2017-02-24 ENCOUNTER — Other Ambulatory Visit: Payer: Medicare Other

## 2017-03-04 ENCOUNTER — Telehealth: Payer: Self-pay | Admitting: *Deleted

## 2017-03-04 NOTE — Telephone Encounter (Signed)
Oncology Nurse Navigator Documentation  Oncology Nurse Navigator Flowsheets 03/04/2017  Navigator Location [facility]  Navigator Encounter Type Telephone/I followed up on Beverly Lewis ct scan and it has not been scheduled. I called but was unable to reach her.  I left vm message to call with my name and phone number.   Telephone Outgoing Call  Treatment Phase Treatment  Barriers/Navigation Needs Coordination of Care  Interventions Coordination of Care  Coordination of Care Other  Acuity Level 1  Time Spent with Patient 15

## 2017-03-10 ENCOUNTER — Other Ambulatory Visit: Payer: Medicare Other

## 2017-03-10 ENCOUNTER — Ambulatory Visit: Payer: Medicare Other

## 2017-03-10 ENCOUNTER — Ambulatory Visit: Payer: Medicare Other | Admitting: Internal Medicine

## 2017-03-22 ENCOUNTER — Other Ambulatory Visit: Payer: Medicare Other

## 2017-03-22 DEATH — deceased

## 2017-03-24 ENCOUNTER — Ambulatory Visit: Payer: Medicare Other | Admitting: Internal Medicine

## 2017-03-24 ENCOUNTER — Other Ambulatory Visit: Payer: Medicare Other

## 2017-03-24 ENCOUNTER — Ambulatory Visit: Payer: Medicare Other

## 2017-03-29 ENCOUNTER — Ambulatory Visit: Payer: Medicare Other | Admitting: Internal Medicine

## 2017-03-29 ENCOUNTER — Other Ambulatory Visit: Payer: Self-pay | Admitting: Nurse Practitioner

## 2019-05-26 IMAGING — CT CT ANGIO CHEST
2 of 7 series · 16 of 36 positions shown · IV contrast (ISOVUE 370)
Comparison: CT of the chest without contrast on 10/06/2016 as well
as multiple prior additional studies.

CLINICAL DATA: Chest pain, left-sided chest wall pain and history
of lung carcinoma.

EXAM:
CT ANGIOGRAPHY CHEST WITH CONTRAST
TECHNIQUE: Multidetector CT imaging of the chest was performed using the
standard protocol during bolus administration of intravenous
contrast. Multiplanar CT image reconstructions and MIPs were
obtained to evaluate the vascular anatomy.
CONTRAST:  100 mL Isovue 370 IV

[Series 7: thins for pacs · axial · 0.74mm/px · z∈[+1504,+1724]mm · 15 of 251 slices shown]
[im 16/251  lung]
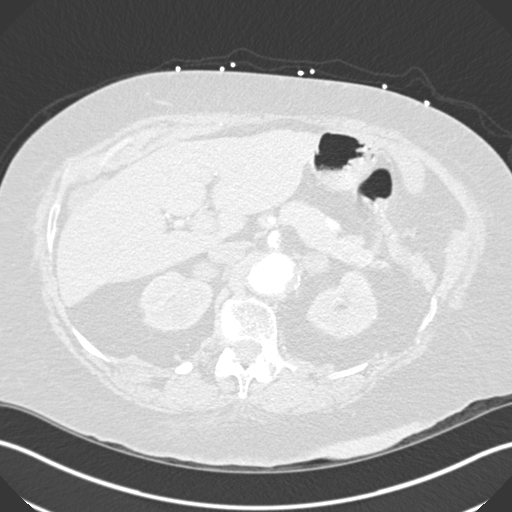
[im 32/251  mediastinal]
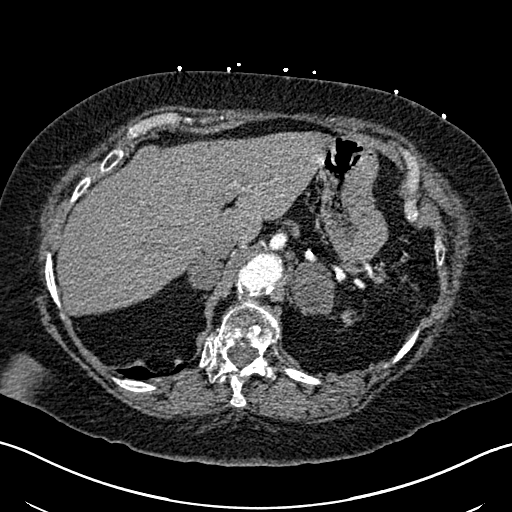
[im 47/251  lung]
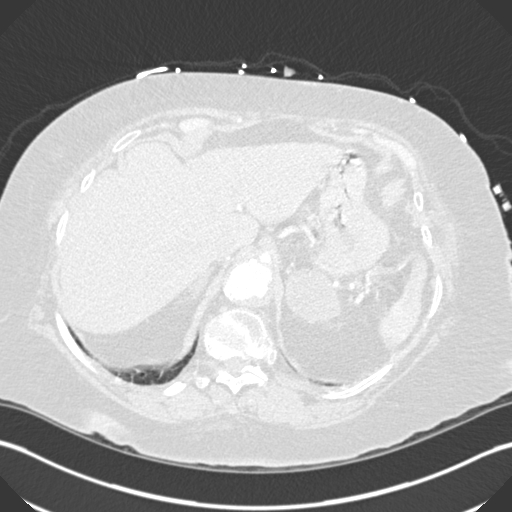
[im 63/251  mediastinal]
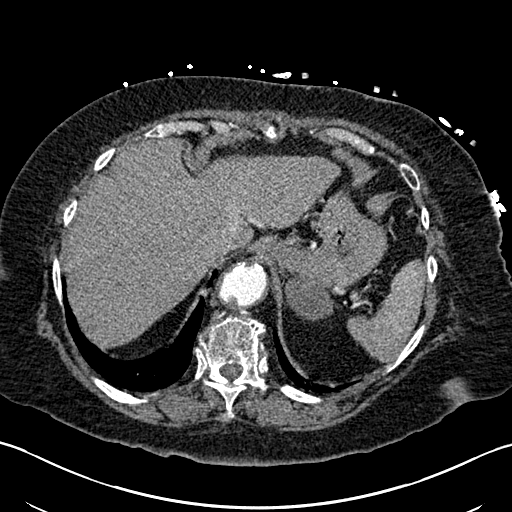
[im 79/251  lung]
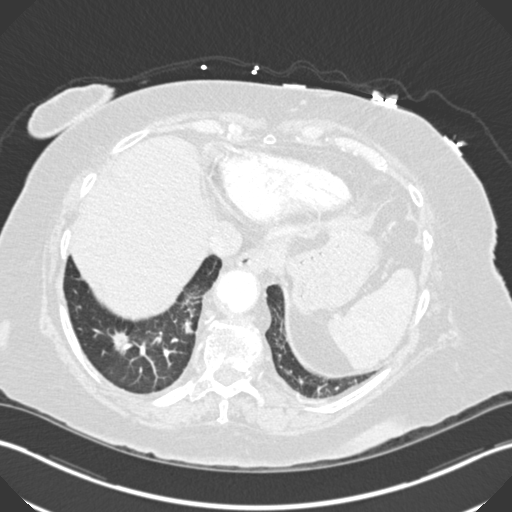
[im 94/251  mediastinal]
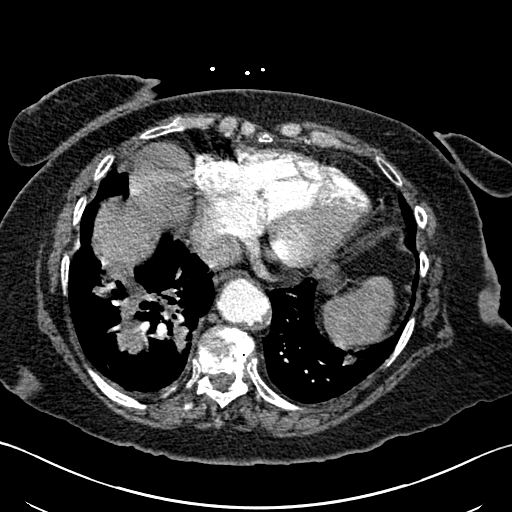
[im 110/251  lung]
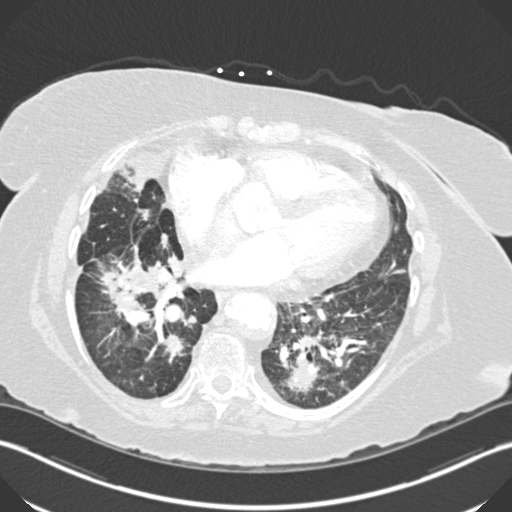
[im 126/251  mediastinal]
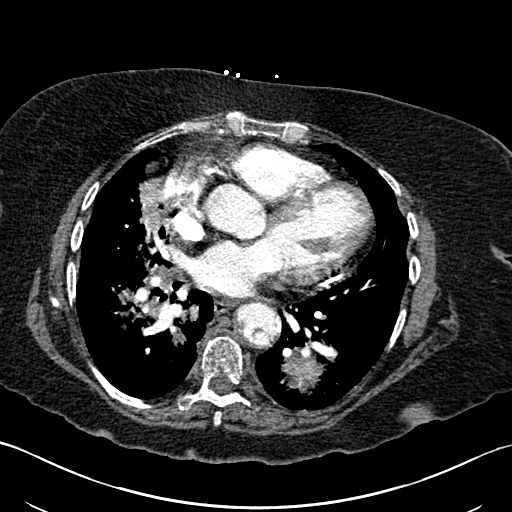
[im 141/251  lung]
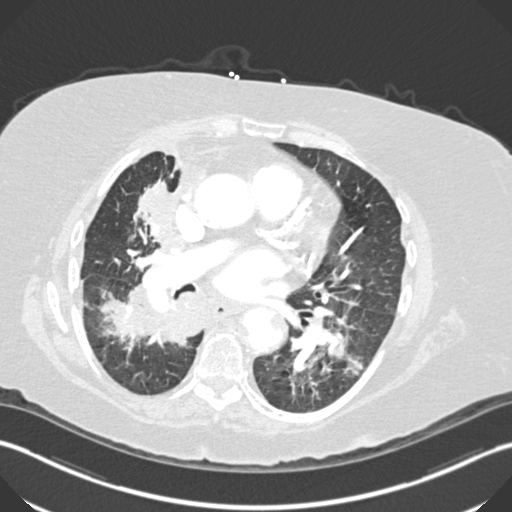
[im 157/251  mediastinal]
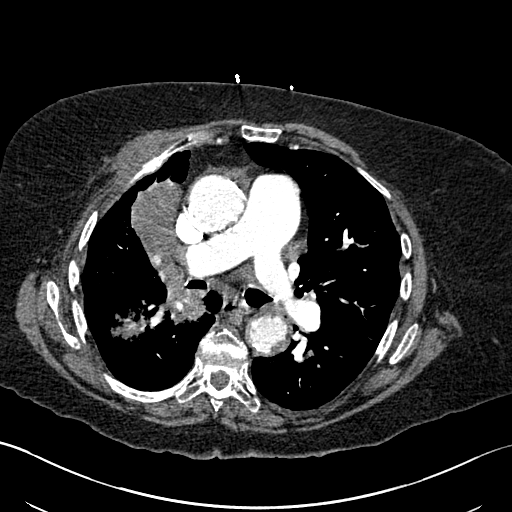
[im 172/251  lung]
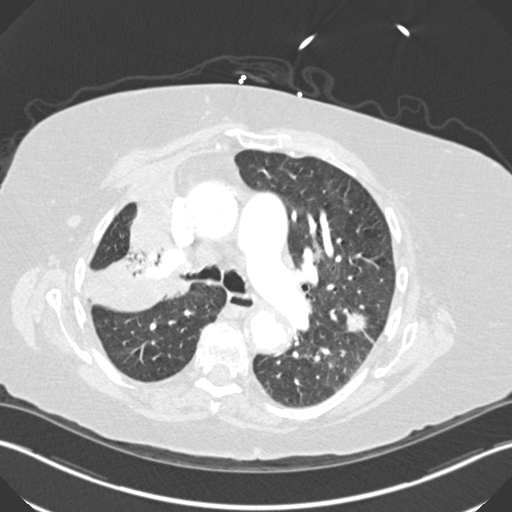
[im 188/251  mediastinal]
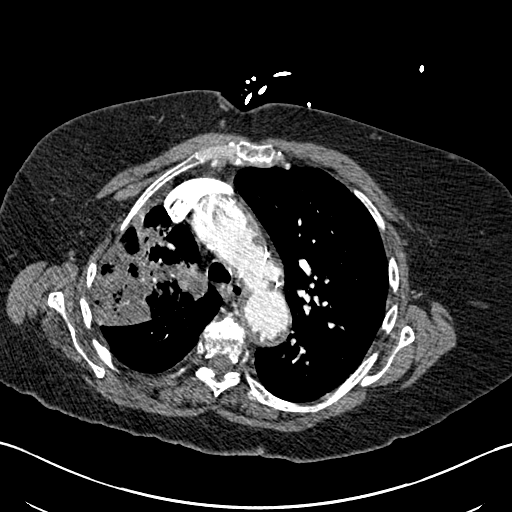
[im 204/251  lung]
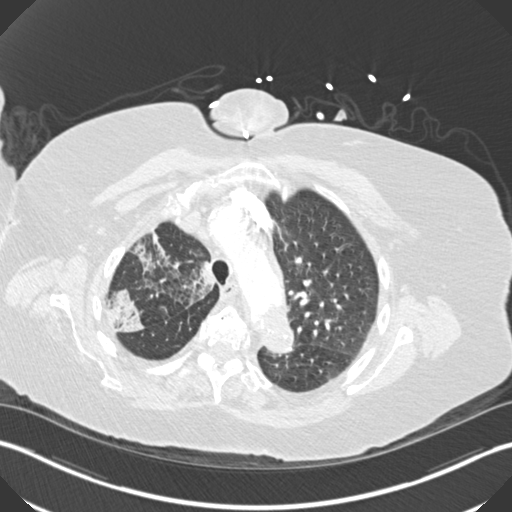
[im 219/251  mediastinal]
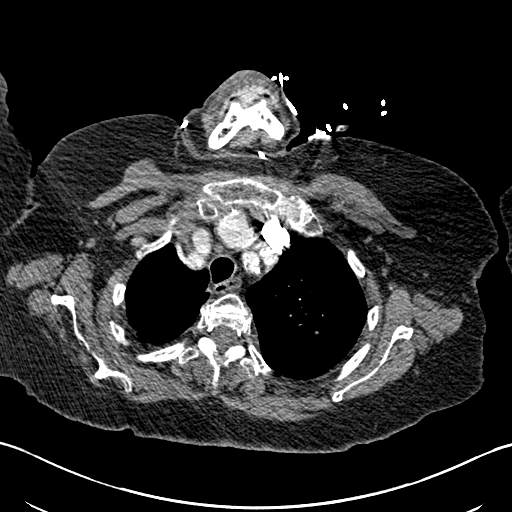
[im 235/251  lung]
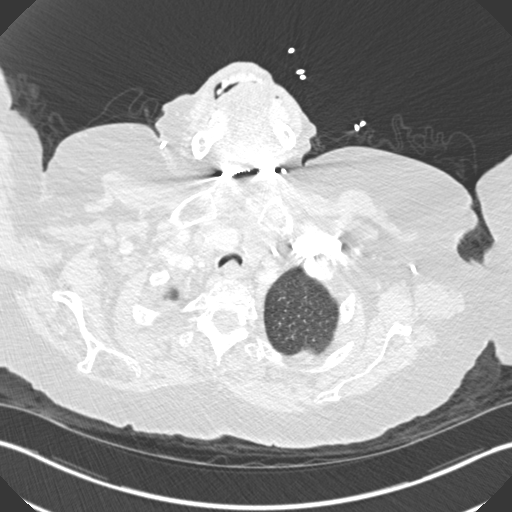

[Series 9: coronal mpr · coronal · 0.49mm/px · 1 of 157 slices shown]
[im 79/157  mediastinal]
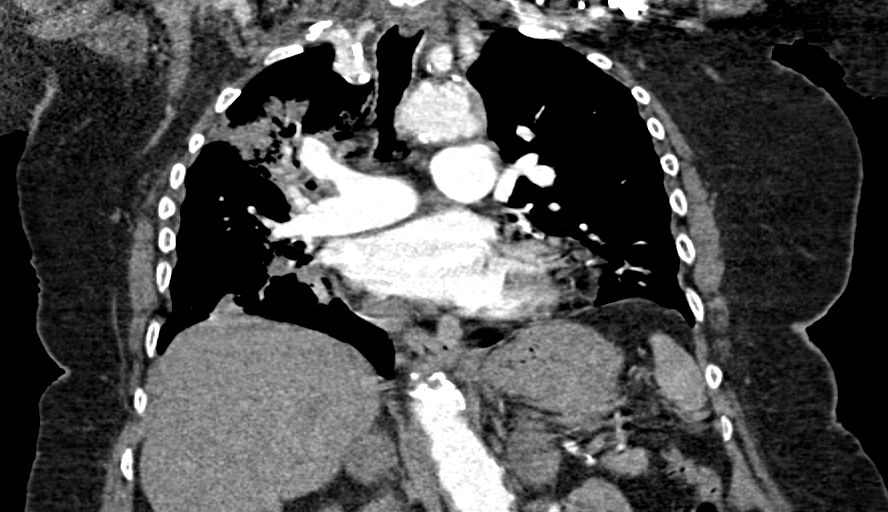

[16 of 36 positions shown; findings below may reference images not displayed]

FINDINGS: Cardiovascular: The pulmonary arteries are well opacified and
demonstrate no evidence of pulmonary embolism. Stable heart size and
calcified coronary artery plaque in the distribution of the LAD and
left circumflex coronary arteries. The thoracic aorta demonstrates
stable atherosclerosis with irregular calcified and noncalcified
plaque present, some of which appears ulcerated. Stable mild
aneurysmal dilatation of the ascending thoracic aorta measures
approximately 4.3 cm. Stable thrombosed penetrating ulcer creating
2.5 cm aneurysmal outpouching along the superior aspect of the
proximal descending thoracic aorta along the posterior aspect of the
aortic arch. No pericardial effusion identified.

Mediastinum/Nodes: Interval enlargement of right lower paratracheal
lymph node measuring 1.6 cm in short axis compared to approximately
1.3 cm previously.

Lungs/Pleura: There is progression of malignancy since prior
imaging. Disease in the right upper lobe appears fairly stable.
Confluent masses in the posterior perihilar region of the right lung
extending into the right lower lobe are more prominent with maximal
conglomerate transverse diameter of tumor measuring approximately 4
x 7.5 cm. Spiculated nodule in the left upper lobe on image number
27 appears fairly stable and measures approximately 1.7 cm. Nodule
in the superior segment of the left lower lobe on image number 30
measures 8 mm and is fairly stable. There is progression of tumor in
the posterior and inferior left lower lobe with merging of separate
areas of tumor seen previously into a more confluent area of tumor
now measuring approximately 5 cm in greatest diameter. Irregular
areas of lobulated tumor at the right lung base appear fairly stable
and measure approximately 8.3 cm in greatest transverse diameter.

Upper Abdomen: Progression of bilateral adrenal metastases with left
adrenal metastasis now measuring 3.6 x 4.5 cm compared to
approximately 3.3 cm in maximal diameter previously and right
adrenal metastasis measuring 2.6 x 3.0 cm compared to 2 cm
previously.

Musculoskeletal: No chest wall abnormality. No acute or significant
osseous findings.

Review of the MIP images confirms the above findings.
IMPRESSION: 1. No evidence of acute pulmonary embolism.
2. Progression of lung carcinoma with enlarged right lower
paratracheal lymph node and progression of left lower lobe tumor and
bilateral adrenal metastases. Malignancy in the right lung and left
upper lung appears relatively stable.
3. Stable mild aneurysmal dilatation of the ascending thoracic aorta
and thrombosed penetrating ulcer of the proximal descending thoracic
aorta.
